# Patient Record
Sex: Female | Born: 1990 | Race: Black or African American | Hispanic: No | Marital: Single | Smoking: Current every day smoker
Health system: Southern US, Community
[De-identification: ages and names within clinical notes are randomized; demographics above are authoritative.]

## PROBLEM LIST (undated history)

## (undated) ENCOUNTER — Inpatient Hospital Stay (HOSPITAL_COMMUNITY): Payer: Self-pay

## (undated) DIAGNOSIS — R002 Palpitations: Secondary | ICD-10-CM

## (undated) DIAGNOSIS — R51 Headache: Secondary | ICD-10-CM

## (undated) DIAGNOSIS — K819 Cholecystitis, unspecified: Secondary | ICD-10-CM

## (undated) DIAGNOSIS — S060XAA Concussion with loss of consciousness status unknown, initial encounter: Secondary | ICD-10-CM

## (undated) DIAGNOSIS — O139 Gestational [pregnancy-induced] hypertension without significant proteinuria, unspecified trimester: Secondary | ICD-10-CM

## (undated) DIAGNOSIS — D649 Anemia, unspecified: Secondary | ICD-10-CM

## (undated) DIAGNOSIS — O09299 Supervision of pregnancy with other poor reproductive or obstetric history, unspecified trimester: Secondary | ICD-10-CM

## (undated) DIAGNOSIS — A749 Chlamydial infection, unspecified: Secondary | ICD-10-CM

## (undated) DIAGNOSIS — I10 Essential (primary) hypertension: Secondary | ICD-10-CM

## (undated) DIAGNOSIS — F419 Anxiety disorder, unspecified: Secondary | ICD-10-CM

## (undated) HISTORY — DX: Gestational (pregnancy-induced) hypertension without significant proteinuria, unspecified trimester: O13.9

## (undated) HISTORY — DX: Supervision of pregnancy with other poor reproductive or obstetric history, unspecified trimester: O09.299

---
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
## 2012-01-13 ENCOUNTER — Encounter (HOSPITAL_COMMUNITY): Payer: Self-pay | Admitting: *Deleted

## 2012-01-13 ENCOUNTER — Inpatient Hospital Stay (HOSPITAL_COMMUNITY)
Admission: AD | Admit: 2012-01-13 | Discharge: 2012-01-13 | Disposition: A | Discharge status: Home / Self Care | Payer: Medicaid Other | Source: Ambulatory Visit | Attending: Obstetrics & Gynecology | Admitting: Obstetrics & Gynecology

## 2012-01-13 DIAGNOSIS — N949 Unspecified condition associated with female genital organs and menstrual cycle: Secondary | ICD-10-CM

## 2012-01-13 DIAGNOSIS — R109 Unspecified abdominal pain: Secondary | ICD-10-CM | POA: Insufficient documentation

## 2012-01-13 DIAGNOSIS — O99891 Other specified diseases and conditions complicating pregnancy: Secondary | ICD-10-CM | POA: Insufficient documentation

## 2012-01-13 HISTORY — DX: Headache: R51

## 2012-01-13 HISTORY — DX: Chlamydial infection, unspecified: A74.9

## 2012-01-13 LAB — URINALYSIS, ROUTINE W REFLEX MICROSCOPIC
Bilirubin Urine: NEGATIVE
Hgb urine dipstick: NEGATIVE
Specific Gravity, Urine: 1.025 (ref 1.005–1.030)
pH: 6 (ref 5.0–8.0)

## 2012-01-13 LAB — WET PREP, GENITAL
Clue Cells Wet Prep HPF POC: NONE SEEN
Trich, Wet Prep: NONE SEEN
Yeast Wet Prep HPF POC: NONE SEEN

## 2012-01-13 NOTE — MAU Provider Note (Signed)
History     CSN: [id number]  Arrival date and time: 01/13/12 1315   None     Chief Complaint  Patient presents with  . Abdominal Cramping   HPI 21 y.o. G1P0 at [redacted]w[redacted]d with cramping intermittently since Sunday, worse with activity, no bleeding or discharge. Has not started prenatal care, now has Medicaid and is choosing a provider.    Past Medical History  Diagnosis Date  . Chlamydia 10-2011  . Infection   . Headache     Past Surgical History  Procedure Date  . No past surgeries     Family History  Problem Relation Age of Onset  . Anesthesia problems Neg Hx     History  Substance Use Topics  . Smoking status: Current Everyday Smoker -- 0.5 packs/day  . Smokeless tobacco: Never Used  . Alcohol Use: No    Allergies:  Allergies  Allergen Reactions  . Metronidazole Nausea And Vomiting    Prescriptions prior to admission  Medication Sig Dispense Refill  . OVER THE COUNTER MEDICATION Take 1 tablet by mouth daily. Patient takes gummy prenatal vitamin        Review of Systems  Constitutional: Negative.   Respiratory: Negative.   Cardiovascular: Negative.   Gastrointestinal: Positive for abdominal pain. Negative for nausea, vomiting, diarrhea and constipation.  Genitourinary: Negative for dysuria, urgency, frequency, hematuria and flank pain.       Negative for vaginal bleeding, vaginal discharge  Musculoskeletal: Negative.   Neurological: Negative.   Psychiatric/Behavioral: Negative.    Physical Exam   Blood pressure 120/60, pulse 77, temperature 98.8 F (37.1 C), temperature source Oral, resp. rate 16, height 5\' 6"  (1.676 m), weight 127 lb 9.6 oz (57.879 kg), last menstrual period 09/23/2011.  Physical Exam  Nursing note and vitals reviewed. Constitutional: She is oriented to person, place, and time. She appears well-developed and well-nourished. No distress.  HENT:  Head: Normocephalic and atraumatic.  Cardiovascular: Normal rate.   Respiratory:  Effort normal.  GI: Soft. Bowel sounds are normal. She exhibits no mass. There is no tenderness. There is no rebound and no guarding.  Genitourinary: There is no rash or lesion on the right labia. There is no rash or lesion on the left labia. Uterus is not tender. Enlarged: Size c/w dates. Cervix exhibits no motion tenderness, no discharge and no friability. Right adnexum displays no mass, no tenderness and no fullness. Left adnexum displays no mass, no tenderness and no fullness. No tenderness or bleeding around the vagina. Vaginal discharge (clear/white, nonmalodorous) found.  Musculoskeletal: Normal range of motion.  Neurological: She is alert and oriented to person, place, and time.  Skin: Skin is warm and dry.  Psychiatric: She has a normal mood and affect.    MAU Course  Procedures  Results for orders placed during the hospital encounter of 01/13/12 (from the past 24 hour(s))  URINALYSIS, ROUTINE W REFLEX MICROSCOPIC     Status: Normal   Collection Time   01/13/12  1:30 PM      Component Value Range   Color, Urine YELLOW  YELLOW    APPearance CLEAR  CLEAR    Specific Gravity, Urine 1.025  1.005 - 1.030    pH 6.0  5.0 - 8.0    Glucose, UA NEGATIVE  NEGATIVE (mg/dL)   Hgb urine dipstick NEGATIVE  NEGATIVE    Bilirubin Urine NEGATIVE  NEGATIVE    Ketones, ur NEGATIVE  NEGATIVE (mg/dL)   Protein, ur NEGATIVE  NEGATIVE (mg/dL)  Urobilinogen, UA 0.2  0.0 - 1.0 (mg/dL)   Nitrite NEGATIVE  NEGATIVE    Leukocytes, UA NEGATIVE  NEGATIVE      Assessment and Plan  21 y.o. G1P0000 at [redacted]w[redacted]d Round ligament pain Wet prep and GC pending - will call with results   Markice Torbert 01/13/2012, 3:06 PM

## 2012-01-13 NOTE — MAU Note (Signed)
Pt states abdominal cramping started at work on Sunday of this week about 1700.  Pt denies any vaginal bleeding and any changes in vaginal discharge.  Pt came to MAU today without any medical attention since beginning of pregnancy.  Pt just received Medicaid on 12-23-11 and is undecided on OB at this time.

## 2012-01-14 LAB — GC/CHLAMYDIA PROBE AMP, GENITAL: Chlamydia, DNA Probe: NEGATIVE

## 2012-01-17 NOTE — MAU Provider Note (Signed)
Attestation of Attending Supervision of Advanced Practitioner: Evaluation and management procedures were performed by the OB Fellow/PA/CNM/NP under my supervision and collaboration. Chart reviewed, and agree with management and plan.  Kerri Asche, M.D. 01/17/2012 11:27 AM   

## 2012-02-20 LAB — OB RESULTS CONSOLE HEPATITIS B SURFACE ANTIGEN: Hepatitis B Surface Ag: NEGATIVE

## 2012-02-20 LAB — OB RESULTS CONSOLE RPR: RPR: NONREACTIVE

## 2012-02-20 LAB — OB RESULTS CONSOLE HIV ANTIBODY (ROUTINE TESTING): HIV: NONREACTIVE

## 2012-02-20 LAB — OB RESULTS CONSOLE RUBELLA ANTIBODY, IGM: Rubella: IMMUNE

## 2012-04-24 ENCOUNTER — Inpatient Hospital Stay (HOSPITAL_COMMUNITY)
Admission: AD | Admit: 2012-04-24 | Discharge: 2012-04-24 | Discharge status: Against Medical Advice | Payer: Medicaid Other | Source: Ambulatory Visit | Attending: Obstetrics | Admitting: Obstetrics

## 2012-04-24 DIAGNOSIS — R109 Unspecified abdominal pain: Secondary | ICD-10-CM | POA: Insufficient documentation

## 2012-04-24 DIAGNOSIS — M545 Low back pain, unspecified: Secondary | ICD-10-CM | POA: Insufficient documentation

## 2012-04-24 DIAGNOSIS — O99891 Other specified diseases and conditions complicating pregnancy: Secondary | ICD-10-CM | POA: Insufficient documentation

## 2012-04-24 LAB — URINALYSIS, ROUTINE W REFLEX MICROSCOPIC
Bilirubin Urine: NEGATIVE
Ketones, ur: NEGATIVE mg/dL
Nitrite: NEGATIVE
Urobilinogen, UA: 0.2 mg/dL (ref 0.0–1.0)

## 2012-04-24 LAB — URINE MICROSCOPIC-ADD ON

## 2012-04-24 NOTE — MAU Note (Signed)
Pt reports pain in lower abd and lower back for 2 hours. Pain comes and goes. Denies dysuria. Denies vaginal bleeding.

## 2012-04-24 NOTE — MAU Note (Signed)
Patient told registration she was leaving just as she was about to be called back. Tracked patient down leaving in her car. She stated her pain was better and didn't think she needed to be seen anymore. She just wanted to lay down. I instructed the patient if she came back in she would be called back and seen right away. She stated she would return to the hospital if the pain returned or worsened.

## 2012-05-04 ENCOUNTER — Inpatient Hospital Stay (HOSPITAL_COMMUNITY)
Admission: AD | Admit: 2012-05-04 | Discharge: 2012-05-04 | Disposition: A | Discharge status: Home / Self Care | Payer: Medicaid Other | Source: Ambulatory Visit | Attending: Obstetrics | Admitting: Obstetrics

## 2012-05-04 ENCOUNTER — Encounter (HOSPITAL_COMMUNITY): Payer: Self-pay

## 2012-05-04 DIAGNOSIS — O479 False labor, unspecified: Secondary | ICD-10-CM

## 2012-05-04 DIAGNOSIS — O10019 Pre-existing essential hypertension complicating pregnancy, unspecified trimester: Secondary | ICD-10-CM | POA: Insufficient documentation

## 2012-05-04 DIAGNOSIS — O210 Mild hyperemesis gravidarum: Secondary | ICD-10-CM | POA: Insufficient documentation

## 2012-05-04 DIAGNOSIS — O169 Unspecified maternal hypertension, unspecified trimester: Secondary | ICD-10-CM

## 2012-05-04 LAB — WET PREP, GENITAL
Clue Cells Wet Prep HPF POC: NONE SEEN
Trich, Wet Prep: NONE SEEN
Yeast Wet Prep HPF POC: NONE SEEN

## 2012-05-04 LAB — COMPREHENSIVE METABOLIC PANEL
Albumin: 2.8 g/dL — ABNORMAL LOW (ref 3.5–5.2)
BUN: 7 mg/dL (ref 6–23)
Calcium: 9.3 mg/dL (ref 8.4–10.5)
Chloride: 99 mEq/L (ref 96–112)
Creatinine, Ser: 0.44 mg/dL — ABNORMAL LOW (ref 0.50–1.10)
GFR calc non Af Amer: >90 mL/min (ref >90)
Total Bilirubin: 0.1 mg/dL — ABNORMAL LOW (ref 0.3–1.2)

## 2012-05-04 LAB — URINALYSIS, ROUTINE W REFLEX MICROSCOPIC
Glucose, UA: NEGATIVE mg/dL
Hgb urine dipstick: NEGATIVE
Specific Gravity, Urine: 1.02 (ref 1.005–1.030)
Urobilinogen, UA: 0.2 mg/dL (ref 0.0–1.0)

## 2012-05-04 LAB — CBC
HCT: 30.3 % — ABNORMAL LOW (ref 36.0–46.0)
MCH: 23.8 pg — ABNORMAL LOW (ref 26.0–34.0)
MCV: 70 fL — ABNORMAL LOW (ref 78.0–100.0)
RDW: 14.6 % (ref 11.5–15.5)
WBC: 11.7 10*3/uL — ABNORMAL HIGH (ref 4.0–10.5)

## 2012-05-04 MED ORDER — NIFEDIPINE 10 MG PO CAPS
ORAL_CAPSULE | ORAL | Status: AC
  Filled 2012-05-04: qty 1

## 2012-05-04 MED ORDER — NIFEDIPINE 10 MG PO CAPS
10.0000 mg | ORAL_CAPSULE | ORAL | Status: DC | PRN
  Administered 2012-05-04 (×2): 10 mg via ORAL
  Filled 2012-05-04: qty 1

## 2012-05-04 NOTE — MAU Provider Note (Signed)
Chief Complaint:  Contractions   First Provider Initiated Contact with Patient 05/04/12 2025      HPI  [name] [name] is a 21 y.o. G1P0000 at [redacted]w[redacted]d presenting with upper and lower abdominal pain/tightening for a couple of weeks, she states that today the pain has become more frequent, ~3-10 minutes. She states that she told dr Gaynell Face about it and he advised her to come in. She denies HA, vision changes, n/v/d, vaginal bleeding, lof or recent intercourse. She reports good fetal movement, vaginal discharge.   Past Medical History: Past Medical History  Diagnosis Date  . Chlamydia 10-2011  . Infection   . Headache     Past Surgical History: Past Surgical History  Procedure Date  . No past surgeries     Family History: Family History  Problem Relation Age of Onset  . Anesthesia problems Neg Hx     Social History: History  Substance Use Topics  . Smoking status: Current Everyday Smoker -- 0.5 packs/day    Types: Cigarettes  . Smokeless tobacco: Never Used  . Alcohol Use: No    Allergies:  Allergies  Allergen Reactions  . Metronidazole Nausea And Vomiting    Meds:  Prescriptions prior to admission  Medication Sig Dispense Refill  . Prenatal Vit-Fe Fumarate-FA (PRENATAL MULTIVITAMIN) TABS Take 1 tablet by mouth daily.          Physical Exam  Blood pressure 139/81, pulse 103, temperature 98.5 F (36.9 C), temperature source Oral, resp. rate 20, height 5' 4.5" (1.638 m), weight 70.818 kg (156 lb 2 oz), last menstrual period 09/23/2011.  --   --   --   --  PO     05/04/12 2231   --   109   --   --   140/61 mmHg       --  PO     05/04/12 2216   --   112   --   --   142/70 mmHg       --  PO     05/04/12 2201   --   108   --   --   131/70 mmHg       --  PO     05/04/12 2146   --   110   --   --   120/95 mmHg       --  PO     05/04/12 2131   --   97   --   --   132/72 mmHg       --  PO     05/04/12 2116   --   96   --   --   132/74 mmHg       --  PO     05/04/12 2102    --   93   --   --   127/61 mmHg       --  PO     05/04/12 2100   --   97   --   18   127/76 mmHg       --  PO     05/04/12 2040   --   101   --   --   --       --  PO     05/04/12 2040   --   --   --   --   139/81 mmHg       --  Ellard Artis  05/04/12 1939   98.5 F (36.9 C)   103   --   20   ! 141/113 mmHg       70.818 kg (156 lb 2 oz)  CH        GENERAL: Well-developed, well-nourished female in mild distress.  HEENT: normocephalic, good dentition HEART: normal rate RESP: normal effort ABDOMEN: Soft, nontender, gravid appropriate for gestational age EXTREMITIES: Nontender, Trace edema NEURO: alert and oriented  SPECULUM EXAM: NEFG, Neg pool, small amount of clear mucous,  Friable cervix.   FHT:  Baseline 130 , moderate variability, accelerations present, no decelerations Contractions: q 3-5 mins    Cervix closed and long  Labs: Recent Results (from the past 168 hour(s))  URINALYSIS, ROUTINE W REFLEX MICROSCOPIC   Collection Time   05/04/12  7:44 PM      Component Value Range   Color, Urine YELLOW  YELLOW   APPearance CLEAR  CLEAR   Specific Gravity, Urine 1.020  1.005 - 1.030   pH 6.0  5.0 - 8.0   Glucose, UA NEGATIVE  NEGATIVE mg/dL   Hgb urine dipstick NEGATIVE  NEGATIVE   Bilirubin Urine NEGATIVE  NEGATIVE   Ketones, ur NEGATIVE  NEGATIVE mg/dL   Protein, ur NEGATIVE  NEGATIVE mg/dL   Urobilinogen, UA 0.2  0.0 - 1.0 mg/dL   Nitrite NEGATIVE  NEGATIVE   Leukocytes, UA NEGATIVE  NEGATIVE  WET PREP, GENITAL   Collection Time   05/04/12  8:37 PM      Component Value Range   Yeast Wet Prep HPF POC NONE SEEN  NONE SEEN   Trich, Wet Prep NONE SEEN  NONE SEEN   Clue Cells Wet Prep HPF POC NONE SEEN  NONE SEEN   WBC, Wet Prep HPF POC MODERATE (*) NONE SEEN  GC/CHLAMYDIA PROBE AMP, GENITAL   Collection Time   05/04/12  8:37 PM      Component Value Range   GC Probe Amp, Genital NEGATIVE  NEGATIVE   Chlamydia, DNA Probe NEGATIVE  NEGATIVE  CBC   Collection Time   05/04/12  9:15 PM        Component Value Range   WBC 11.7 (*) 4.0 - 10.5 K/uL   RBC 4.33  3.87 - 5.11 MIL/uL   Hemoglobin 10.3 (*) 12.0 - 15.0 g/dL   HCT 16.1 (*) 09.6 - 04.5 %   MCV 70.0 (*) 78.0 - 100.0 fL   MCH 23.8 (*) 26.0 - 34.0 pg   MCHC 34.0  30.0 - 36.0 g/dL   RDW 40.9  81.1 - 91.4 %   Platelets 340  150 - 400 K/uL  COMPREHENSIVE METABOLIC PANEL   Collection Time   05/04/12  9:15 PM      Component Value Range   Sodium 132 (*) 135 - 145 mEq/L   Potassium 3.9  3.5 - 5.1 mEq/L   Chloride 99  96 - 112 mEq/L   CO2 24  19 - 32 mEq/L   Glucose, Bld 84  70 - 99 mg/dL   BUN 7  6 - 23 mg/dL   Creatinine, Ser 7.82 (*) 0.50 - 1.10 mg/dL   Calcium 9.3  8.4 - 95.6 mg/dL   Total Protein 7.1  6.0 - 8.3 g/dL   Albumin 2.8 (*) 3.5 - 5.2 g/dL   AST 16  0 - 37 U/L   ALT 12  0 - 35 U/L   Alkaline Phosphatase 109  39 - 117 U/L   Total Bilirubin 0.1 (*)  0.3 - 1.2 mg/dL   GFR calc non Af Amer >90  >90 mL/min   GFR calc Af Amer >90  >90 mL/min   Imaging:  NA  Assessment: 1. Preterm contractions   2. Hypertension complicating pregnancy    Plan: D/C home PTL precautions Follow-up Information    Follow up with MARSHALL,BERNARD A, MD. (As scheduled or as needed if symptoms worsen)    Contact information:   [address] [place] [address] [phone]       Follow up with [facility]. (As needed if symptoms worsen)    Contact information:   [address] [place] [address] [phone]        Medication List  As of 05/07/2012 11:00 PM   CONTINUE taking these medications         prenatal multivitamin Tabs           Dorathy Kinsman 8/2/20138:43 PM

## 2012-05-04 NOTE — MAU Note (Signed)
Pt states, " I've had contractions and low back pain off and on for a couple of weeks. Today it woke me up and and I've had them off and on all day."

## 2012-05-04 NOTE — MAU Note (Signed)
Patient is in with c/o ongoing abdominal pain/tightening for a couple of weeks, she states that today the pain more frequent. She states that she told dr Gaynell Face about it and he advised her to come in. She denies any n/v/d, vaginal bleeding, lof or recent intercourse. She reports good fetal movement.

## 2012-05-28 LAB — OB RESULTS CONSOLE GBS: GBS: POSITIVE

## 2012-06-01 ENCOUNTER — Inpatient Hospital Stay (HOSPITAL_COMMUNITY)
Admission: AD | Admit: 2012-06-01 | Discharge: 2012-06-01 | Disposition: A | Discharge status: Home / Self Care | Payer: Medicaid Other | Source: Ambulatory Visit | Attending: Obstetrics | Admitting: Obstetrics

## 2012-06-01 ENCOUNTER — Encounter (HOSPITAL_COMMUNITY): Payer: Self-pay | Admitting: *Deleted

## 2012-06-01 DIAGNOSIS — O479 False labor, unspecified: Secondary | ICD-10-CM

## 2012-06-01 DIAGNOSIS — O4700 False labor before 37 completed weeks of gestation, unspecified trimester: Secondary | ICD-10-CM | POA: Insufficient documentation

## 2012-06-01 NOTE — MAU Note (Signed)
PT SAYS  HURT BAD AT 0100-  VE IN OFFICE - DOESN'T KNOW.    DENIES HSV AND MRSA

## 2012-06-01 NOTE — MAU Provider Note (Signed)
Chief Complaint:  No chief complaint on file.  First Provider Initiated Contact with Patient 06/01/12 0354     HPI: [name] [name] is a 21 y.o. G1P0000 at 31w4dwho presents to maternity admissions reporting contractions every evening x a few weeks that were stronger tonight. Denies leakage of fluid or vaginal bleeding. Good fetal movement.   Past Medical History: Past Medical History  Diagnosis Date  . Chlamydia 10-2011  . Infection   . Headache     Past obstetric history: OB History    Grav Para Term Preterm Abortions TAB SAB Ect Mult Living   1 0 0 0 0 0 0 0 0 0      # Outc Date GA Lbr Len/2nd Wgt Sex Del Anes PTL Lv   1 CUR               Past Surgical History: Past Surgical History  Procedure Date  . No past surgeries     Family History: Family History  Problem Relation Age of Onset  . Anesthesia problems Neg Hx     Social History: History  Substance Use Topics  . Smoking status: Current Everyday Smoker -- 0.5 packs/day    Types: Cigarettes  . Smokeless tobacco: Never Used  . Alcohol Use: No    Allergies:  Allergies  Allergen Reactions  . Metronidazole Nausea And Vomiting    Meds:  Prescriptions prior to admission  Medication Sig Dispense Refill  . Prenatal Vit-Fe Fumarate-FA (PRENATAL MULTIVITAMIN) TABS Take 1 tablet by mouth daily.        ROS: Pertinent findings in history of present illness.  Physical Exam  Blood pressure 139/80, pulse 86, temperature 98.4 F (36.9 C), temperature source Oral, resp. rate 20, height 5\' 4"  (1.626 m), weight 77.565 kg (171 lb), last menstrual period 09/23/2011. GENERAL: Well-developed, well-nourished female in mild distress.  HEENT: normocephalic HEART: normal rate RESP: normal effort ABDOMEN: Soft, non-tender, gravid appropriate for gestational age EXTREMITIES: Nontender, no edema NEURO: alert and oriented SPECULUM EXAM: deferred Dilation: Fingertip Cervical Position: Posterior Presentation: Vertex Exam by::  DCALLAWAY, RN  FHT:  Baseline 130 , moderate variability, accelerations present, no decelerations Contractions: q 3-6 mins, mild   Labs: No results found for this or any previous visit (from the past 24 hour(s)).  Imaging:  No results found. ED Course   Assessment: 1. Preterm contractions    Plan: Discharge home Labor precautions and fetal kick counts Comfort measures Medication List  As of 06/01/2012  3:54 AM   ASK your doctor about these medications         prenatal multivitamin Tabs   Take 1 tablet by mouth daily.           Follow-up Information    Follow up with Kathreen Cosier, MD on 06/05/2012.   Contact information:   [address] [place] [address] [phone]       Follow up with [facility]. (As needed if symptoms worsen)    Contact information:   [address] [place] [address] [phone]        Dorathy Kinsman, [place] 06/01/2012 3:54 AM

## 2012-06-14 ENCOUNTER — Inpatient Hospital Stay (HOSPITAL_COMMUNITY)
Admission: AD | Admit: 2012-06-14 | Discharge: 2012-06-15 | Disposition: A | Discharge status: Home / Self Care | Payer: Medicaid Other | Source: Ambulatory Visit | Attending: Obstetrics | Admitting: Obstetrics

## 2012-06-14 ENCOUNTER — Encounter (HOSPITAL_COMMUNITY): Payer: Self-pay | Admitting: *Deleted

## 2012-06-14 DIAGNOSIS — O368190 Decreased fetal movements, unspecified trimester, not applicable or unspecified: Secondary | ICD-10-CM | POA: Insufficient documentation

## 2012-06-14 DIAGNOSIS — O26899 Other specified pregnancy related conditions, unspecified trimester: Secondary | ICD-10-CM

## 2012-06-14 DIAGNOSIS — R109 Unspecified abdominal pain: Secondary | ICD-10-CM | POA: Insufficient documentation

## 2012-06-14 DIAGNOSIS — R51 Headache: Secondary | ICD-10-CM | POA: Insufficient documentation

## 2012-06-14 LAB — COMPREHENSIVE METABOLIC PANEL
AST: 18 U/L (ref 0–37)
Albumin: 2.5 g/dL — ABNORMAL LOW (ref 3.5–5.2)
Alkaline Phosphatase: 163 U/L — ABNORMAL HIGH (ref 39–117)
Chloride: 103 mEq/L (ref 96–112)
Potassium: 3.6 mEq/L (ref 3.5–5.1)
Total Bilirubin: 0.1 mg/dL — ABNORMAL LOW (ref 0.3–1.2)

## 2012-06-14 LAB — CBC
Platelets: 297 10*3/uL (ref 150–400)
RDW: 15.3 % (ref 11.5–15.5)
WBC: 11.5 10*3/uL — ABNORMAL HIGH (ref 4.0–10.5)

## 2012-06-14 LAB — URINE MICROSCOPIC-ADD ON

## 2012-06-14 LAB — URINALYSIS, ROUTINE W REFLEX MICROSCOPIC
Glucose, UA: NEGATIVE mg/dL
Protein, ur: NEGATIVE mg/dL
Urobilinogen, UA: 0.2 mg/dL (ref 0.0–1.0)

## 2012-06-14 MED ORDER — ACETAMINOPHEN 325 MG PO TABS
650.0000 mg | ORAL_TABLET | Freq: Once | ORAL | Status: AC
  Administered 2012-06-14: 650 mg via ORAL
  Filled 2012-06-14: qty 2

## 2012-06-14 MED ORDER — GI COCKTAIL ~~LOC~~
30.0000 mL | Freq: Once | ORAL | Status: AC
  Administered 2012-06-14: 30 mL via ORAL
  Filled 2012-06-14: qty 30

## 2012-06-14 NOTE — MAU Note (Signed)
Patient is in with c/o intense nausea and epigastric/abominal cramping after eating chinese food this afternoon. She c/o decreased fetal movement, intermittent blurred vision and headache since yesterday. Denies any vaginal bleeding or lof.

## 2012-06-14 NOTE — MAU Provider Note (Signed)
  History     CSN: [id number]  Arrival date and time: 06/14/12 2036   First Provider Initiated Contact with Patient 06/14/12 2201      Chief Complaint  Patient presents with  . Nausea  . Abdominal Cramping  . Decreased Fetal Movement  . Headache  . Blurred Vision   HPI Pt is [redacted]w[redacted]d pregnant and presents with headache in pregnancy and upper abdominal pain.  Pt states that she has had headache with intermittent blurred vision for 2 days.  She also has noticed decrease fetal movement.  Pt denies contractions, bleeding, spotting, or leakage of fluid.  Pt has had some nausea but no vomiting.  Pt ate [place] food today at 4 pm and had increase in her nausea.  Pt has not taken anything for her headache.  Past Medical History  Diagnosis Date  . Chlamydia 10-2011  . Infection   . Headache     Past Surgical History  Procedure Date  . No past surgeries     Family History  Problem Relation Age of Onset  . Anesthesia problems Neg Hx     History  Substance Use Topics  . Smoking status: Current Every Day Smoker -- 0.2 packs/day for 3 years    Types: Cigarettes  . Smokeless tobacco: Never Used  . Alcohol Use: No    Allergies:  Allergies  Allergen Reactions  . Metronidazole Nausea And Vomiting    No prescriptions prior to admission    ROS Physical Exam   Blood pressure 139/71, pulse 84, temperature 99.3 F (37.4 C), temperature source Oral, resp. rate 20, height 5\' 4"  (1.626 m), weight 78.245 kg (172 lb 8 oz), last menstrual period 09/23/2011, SpO2 100.00%.  Physical Exam  MAU Course  Procedures Headache resolved with Tylenol; upper abdominal pain improved Results for orders placed during the hospital encounter of 06/14/12 (from the past 24 hour(s))  URINALYSIS, ROUTINE W REFLEX MICROSCOPIC     Status: Abnormal   Collection Time   06/14/12  9:08 PM      Component Value Range   Color, Urine YELLOW  YELLOW   APPearance CLEAR  CLEAR   Specific Gravity, Urine 1.015   1.005 - 1.030   pH 7.0  5.0 - 8.0   Glucose, UA NEGATIVE  NEGATIVE mg/dL   Hgb urine dipstick NEGATIVE  NEGATIVE   Bilirubin Urine NEGATIVE  NEGATIVE   Ketones, ur NEGATIVE  NEGATIVE mg/dL   Protein, ur NEGATIVE  NEGATIVE mg/dL   Urobilinogen, UA 0.2  0.0 - 1.0 mg/dL   Nitrite NEGATIVE  NEGATIVE   Leukocytes, UA TRACE (*) NEGATIVE  URINE MICROSCOPIC-ADD ON     Status: Abnormal   Collection Time   06/14/12  9:08 PM      Component Value Range   Squamous Epithelial / LPF MANY (*) RARE   WBC, UA 3-6  <3 WBC/hpf   Bacteria, UA FEW (*) RARE   Urine-Other MUCOUS PRESENT     Assessment and Plan  Headache in pregnancy  [name] [name] 06/14/2012, 10:45 PM

## 2012-06-18 ENCOUNTER — Encounter (HOSPITAL_COMMUNITY): Payer: Self-pay | Admitting: *Deleted

## 2012-06-18 ENCOUNTER — Inpatient Hospital Stay (HOSPITAL_COMMUNITY)
Admission: AD | Admit: 2012-06-18 | Discharge: 2012-06-18 | Disposition: A | Discharge status: Home / Self Care | Payer: Medicaid Other | Source: Ambulatory Visit | Attending: Obstetrics | Admitting: Obstetrics

## 2012-06-18 DIAGNOSIS — O479 False labor, unspecified: Secondary | ICD-10-CM

## 2012-06-18 DIAGNOSIS — R109 Unspecified abdominal pain: Secondary | ICD-10-CM | POA: Insufficient documentation

## 2012-06-18 LAB — COMPREHENSIVE METABOLIC PANEL
ALT: 10 U/L (ref 0–35)
Alkaline Phosphatase: 168 U/L — ABNORMAL HIGH (ref 39–117)
GFR calc Af Amer: >90 mL/min (ref >90)
Glucose, Bld: 64 mg/dL — ABNORMAL LOW (ref 70–99)
Potassium: 3.7 mEq/L (ref 3.5–5.1)
Sodium: 135 mEq/L (ref 135–145)
Total Protein: 6.3 g/dL (ref 6.0–8.3)

## 2012-06-18 LAB — URINALYSIS, ROUTINE W REFLEX MICROSCOPIC
Glucose, UA: NEGATIVE mg/dL
Hgb urine dipstick: NEGATIVE
Protein, ur: NEGATIVE mg/dL
Specific Gravity, Urine: 1.015 (ref 1.005–1.030)
pH: 6.5 (ref 5.0–8.0)

## 2012-06-18 LAB — CBC
Hemoglobin: 9.7 g/dL — ABNORMAL LOW (ref 12.0–15.0)
MCHC: 31.7 g/dL (ref 30.0–36.0)
RBC: 4.24 MIL/uL (ref 3.87–5.11)

## 2012-06-18 NOTE — MAU Note (Signed)
Patient states she is having contractions about every 2 minutes. Denies any leaking or bleeding and reports good fetal movement.

## 2012-06-18 NOTE — MAU Provider Note (Signed)
History     CSN: [id number]  Arrival date and time: 06/18/12 1028   First Provider Initiated Contact with Patient 06/18/12 1156      Chief Complaint  Patient presents with  . Labor Eval   HPI [name] [name] is 21 y.o. G1P0000 [redacted]w[redacted]d weeks presenting with lower abdominal pain.  States "my pelvic bone feels like it is pulling".  Patient of Dr. Elsie Stain.  Denies vaginal bleeding.  Her blood pressure is elevated.  Dr. Gaynell Face was informed and he has given PIH orders.  I have completed the MSE.  Patient is stable.      Past Medical History  Diagnosis Date  . Chlamydia 10-2011  . Infection   . Headache     Past Surgical History  Procedure Date  . No past surgeries     Family History  Problem Relation Age of Onset  . Anesthesia problems Neg Hx     History  Substance Use Topics  . Smoking status: Current Every Day Smoker -- 0.2 packs/day for 3 years    Types: Cigarettes  . Smokeless tobacco: Never Used  . Alcohol Use: No    Allergies:  Allergies  Allergen Reactions  . Metronidazole Nausea And Vomiting    No prescriptions prior to admission    ROS Physical Exam   Blood pressure 145/86, pulse 91, temperature 98.5 F (36.9 C), temperature source Oral, resp. rate 18, height 5\' 4"  (1.626 m), weight 79.924 kg (176 lb 3.2 oz), last menstrual period 09/23/2011, SpO2 100.00%.  Physical Exam  Constitutional: She is oriented to person, place, and time. She appears well-developed and well-nourished. She appears distressed (uncomfortable).  HENT:  Head: Normocephalic.  Cardiovascular: Normal rate.   Respiratory: Effort normal.  GI: There is tenderness (lower abdominal tenderness over the symphysis).  Neurological: She is alert and oriented to person, place, and time.  Skin: Skin is warm and dry.  Psychiatric: She has a normal mood and affect. Her behavior is normal.    Cervical exam by Darel Hong, RN--Cervix is 1 cm dilated Results for orders placed during the hospital  encounter of 06/18/12 (from the past 24 hour(s))  CBC     Status: Abnormal   Collection Time   06/18/12 11:35 AM      Component Value Range   WBC 11.7 (*) 4.0 - 10.5 K/uL   RBC 4.24  3.87 - 5.11 MIL/uL   Hemoglobin 9.7 (*) 12.0 - 15.0 g/dL   HCT 40.9 (*) 81.1 - 91.4 %   MCV 72.2 (*) 78.0 - 100.0 fL   MCH 22.9 (*) 26.0 - 34.0 pg   MCHC 31.7  30.0 - 36.0 g/dL   RDW 78.2  95.6 - 21.3 %   Platelets 279  150 - 400 K/uL  COMPREHENSIVE METABOLIC PANEL     Status: Abnormal   Collection Time   06/18/12 11:35 AM      Component Value Range   Sodium 135  135 - 145 mEq/L   Potassium 3.7  3.5 - 5.1 mEq/L   Chloride 104  96 - 112 mEq/L   CO2 22  19 - 32 mEq/L   Glucose, Bld 64 (*) 70 - 99 mg/dL   BUN 4 (*) 6 - 23 mg/dL   Creatinine, Ser 0.86  0.50 - 1.10 mg/dL   Calcium 9.4  8.4 - 57.8 mg/dL   Total Protein 6.3  6.0 - 8.3 g/dL   Albumin 2.4 (*) 3.5 - 5.2 g/dL   AST 13  0 -  37 U/L   ALT 10  0 - 35 U/L   Alkaline Phosphatase 168 (*) 39 - 117 U/L   Total Bilirubin 0.1 (*) 0.3 - 1.2 mg/dL   GFR calc non Af Amer >90  >90 mL/min   GFR calc Af Amer >90  >90 mL/min  LACTATE DEHYDROGENASE     Status: Normal   Collection Time   06/18/12 11:35 AM      Component Value Range   LDH 161  94 - 250 U/L  URIC ACID     Status: Normal   Collection Time   06/18/12 11:35 AM      Component Value Range   Uric Acid, Serum 3.4  2.4 - 7.0 mg/dL  URINALYSIS, ROUTINE W REFLEX MICROSCOPIC     Status: Normal   Collection Time   06/18/12 12:45 PM      Component Value Range   Color, Urine YELLOW  YELLOW   APPearance CLEAR  CLEAR   Specific Gravity, Urine 1.015  1.005 - 1.030   pH 6.5  5.0 - 8.0   Glucose, UA NEGATIVE  NEGATIVE mg/dL   Hgb urine dipstick NEGATIVE  NEGATIVE   Bilirubin Urine NEGATIVE  NEGATIVE   Ketones, ur NEGATIVE  NEGATIVE mg/dL   Protein, ur NEGATIVE  NEGATIVE mg/dL   Urobilinogen, UA 0.2  0.0 - 1.0 mg/dL   Nitrite NEGATIVE  NEGATIVE   Leukocytes, UA NEGATIVE  NEGATIVE    Patient Vitals  for the past 24 hrs:  BP Temp Temp src Pulse Resp SpO2 Height Weight  06/18/12 1332 112/82 mmHg - - 83  - - - -  06/18/12 1317 124/70 mmHg - - 85  - - - -  06/18/12 1302 132/79 mmHg - - 78  - - - -  06/18/12 1252 136/77 mmHg - - 85  - - - -  06/18/12 1231 138/92 mmHg - - 85  - - - -  06/18/12 1216 142/84 mmHg - - 86  - - - -  06/18/12 1202 143/74 mmHg - - 91  - - - -  06/18/12 1147 134/89 mmHg - - 91  - - - -  06/18/12 1139 145/86 mmHg - - 91  - - - -  06/18/12 1126 143/86 mmHg - - 91  - - - -  06/18/12 1037 144/88 mmHg 98.5 F (36.9 C) Oral 88  18  100 % 5\' 4"  (1.626 m) 79.924 kg (176 lb 3.2 oz)   MAU Course  Procedures  MDM Reported labs to Dr. Gaynell Face at 14:00--order given to be discharged to home.  Patient has appt for tomorrow. FMS REACTIVE  Assessment and Plan  A:  False labor     Borderline blood pressure  P:  Keep scheduled appointment with Dr. Gaynell Face for tomorrow.     Call if sxs worsen     May take tylenol for discomfort Instructed to stay well hydrated and rest today Kalum Minner,EVE M 06/18/2012, 12:03 PM

## 2012-06-19 ENCOUNTER — Encounter (HOSPITAL_COMMUNITY): Payer: Self-pay | Admitting: *Deleted

## 2012-06-19 ENCOUNTER — Telehealth (HOSPITAL_COMMUNITY): Payer: Self-pay | Admitting: *Deleted

## 2012-06-19 NOTE — Telephone Encounter (Signed)
Preadmission screen  

## 2012-06-22 ENCOUNTER — Inpatient Hospital Stay (HOSPITAL_COMMUNITY)
Admission: AD | Admit: 2012-06-22 | Discharge: 2012-06-22 | Disposition: A | Discharge status: Home / Self Care | Payer: Medicaid Other | Source: Ambulatory Visit | Attending: Obstetrics | Admitting: Obstetrics

## 2012-06-22 ENCOUNTER — Encounter (HOSPITAL_COMMUNITY): Payer: Self-pay

## 2012-06-22 DIAGNOSIS — M5430 Sciatica, unspecified side: Secondary | ICD-10-CM | POA: Insufficient documentation

## 2012-06-22 DIAGNOSIS — M259 Joint disorder, unspecified: Secondary | ICD-10-CM | POA: Insufficient documentation

## 2012-06-22 DIAGNOSIS — O479 False labor, unspecified: Secondary | ICD-10-CM | POA: Insufficient documentation

## 2012-06-22 DIAGNOSIS — M545 Low back pain, unspecified: Secondary | ICD-10-CM | POA: Insufficient documentation

## 2012-06-22 DIAGNOSIS — M899 Disorder of bone, unspecified: Secondary | ICD-10-CM | POA: Insufficient documentation

## 2012-06-22 LAB — URINALYSIS, ROUTINE W REFLEX MICROSCOPIC
Bilirubin Urine: NEGATIVE
Glucose, UA: NEGATIVE mg/dL
Hgb urine dipstick: NEGATIVE
Ketones, ur: NEGATIVE mg/dL
Leukocytes, UA: NEGATIVE
pH: 6.5 (ref 5.0–8.0)

## 2012-06-22 LAB — URINE MICROSCOPIC-ADD ON

## 2012-06-22 MED ORDER — TRAMADOL HCL 50 MG PO TABS
50.0000 mg | ORAL_TABLET | Freq: Once | ORAL | Status: AC
  Administered 2012-06-22: 50 mg via ORAL
  Filled 2012-06-22: qty 1

## 2012-06-22 MED ORDER — CYCLOBENZAPRINE HCL 10 MG PO TABS
10.0000 mg | ORAL_TABLET | Freq: Two times a day (BID) | ORAL | Status: DC | PRN
Start: 2012-06-22 — End: 2012-07-03

## 2012-06-22 MED ORDER — TRAMADOL HCL 50 MG PO TABS: 50.0000 mg | ORAL_TABLET | Freq: Four times a day (QID) | ORAL | Status: DC | PRN

## 2012-06-22 NOTE — MAU Note (Signed)
Patient is in with c/o constant lower back pain (10/10) and intermittent contractions. Denies vaginal bleeding or lof. She reports good fetal movement

## 2012-06-22 NOTE — MAU Provider Note (Signed)
  History     CSN: [id number]  Arrival date and time: 06/22/12 2101   First Provider Initiated Contact with Patient 06/22/12 2138      Chief Complaint  Patient presents with  . Back Pain  . Contractions   HPI This is a 21 y.o. female at [redacted]w[redacted]d who presents with c/o sharp lower back pain over sciatic joint, R > L for 2 days. Has never had this pain before. Has irregular contractions, not associated with this pain. They feel like cramping. Hurts to walk. No weakness or numbness. Good fetal movement. Denies leaking or bleeding.  OB History    Grav Para Term Preterm Abortions TAB SAB Ect Mult Living   1 0 0 0 0 0 0 0 0 0       Past Medical History  Diagnosis Date  . Chlamydia 10-2011  . Infection   . Headache     Past Surgical History  Procedure Date  . No past surgeries     Family History  Problem Relation Age of Onset  . Anesthesia problems Neg Hx     History  Substance Use Topics  . Smoking status: Current Every Day Smoker -- 0.2 packs/day for 3 years    Types: Cigarettes  . Smokeless tobacco: Never Used  . Alcohol Use: No    Allergies:  Allergies  Allergen Reactions  . Metronidazole Nausea And Vomiting    No prescriptions prior to admission    ROS See HPI  Physical Exam   Blood pressure 140/89, pulse 108, temperature 98.9 F (37.2 C), temperature source Oral, resp. rate 18, height 5\' 4"  (1.626 m), weight 174 lb 6 oz (79.096 kg), last menstrual period 09/23/2011.  Physical Exam  Constitutional: She is oriented to person, place, and time. She appears well-developed and well-nourished. No distress.  Cardiovascular: Normal rate.   Respiratory: Effort normal.  GI: Soft. She exhibits no distension and no mass. There is no tenderness. There is no rebound and no guarding.  Genitourinary: Vagina normal and uterus normal. No vaginal discharge found.       Dilation: 1 Effacement (%): 70 Cervical Position: Middle Station: -2 Presentation: Vertex Exam by::  [place], rn   Musculoskeletal: Normal range of motion. She exhibits tenderness (over left sciatic joint).  Neurological: She is alert and oriented to person, place, and time.  Skin: Skin is warm and dry.  Psychiatric: She has a normal mood and affect.    MAU Course  Procedures   Assessment and Plan  A:  SIUP at [redacted]w[redacted]d       Sciatica      Prodromal contractions without significant cervical change  P:  Discharge home      Discussed sciatica and remedies. Rx Flexeril and Tramadol in limited supply      Discussed signs of labor      Followup with Dr Gaynell Face.   [facility] 06/22/2012, 9:49 PM

## 2012-06-27 ENCOUNTER — Inpatient Hospital Stay (HOSPITAL_COMMUNITY)
Admission: AD | Admit: 2012-06-27 | Discharge: 2012-06-27 | Disposition: A | Discharge status: Home / Self Care | Payer: Medicaid Other | Source: Ambulatory Visit | Attending: Obstetrics | Admitting: Obstetrics

## 2012-06-27 DIAGNOSIS — O479 False labor, unspecified: Secondary | ICD-10-CM | POA: Insufficient documentation

## 2012-06-27 NOTE — MAU Note (Signed)
Patient is in with c/o ctx q49m and having her "mucus plug" come out. She reports good fetal movement, denies vaginal bleeding or lof.

## 2012-06-27 NOTE — Progress Notes (Signed)
Dr Gaynell Face returned call and was notified that of patient, her complaints of painful, frequent contractions and mucus plug. He is aware of tracing, ctx pattern, sve result and sve result. Order to discharge home.

## 2012-07-02 ENCOUNTER — Encounter (HOSPITAL_COMMUNITY): Payer: Self-pay | Admitting: *Deleted

## 2012-07-02 ENCOUNTER — Inpatient Hospital Stay (HOSPITAL_COMMUNITY)
Admission: AD | Admit: 2012-07-02 | Discharge: 2012-07-03 | Disposition: A | Discharge status: Home / Self Care | Payer: Medicaid Other | Source: Ambulatory Visit | Attending: Obstetrics | Admitting: Obstetrics

## 2012-07-02 DIAGNOSIS — O479 False labor, unspecified: Secondary | ICD-10-CM | POA: Insufficient documentation

## 2012-07-02 NOTE — MAU Note (Signed)
Pt presents for contractions that started at 0200 this am and are now 3-33minutes apart.  Denies any LOF or bleeding.  Reports good fetal movement.

## 2012-07-02 NOTE — MAU Note (Signed)
Contractions all day, "today is my due date",

## 2012-07-03 ENCOUNTER — Encounter (HOSPITAL_COMMUNITY): Payer: Self-pay | Admitting: *Deleted

## 2012-07-03 ENCOUNTER — Encounter (HOSPITAL_COMMUNITY): Payer: Self-pay | Admitting: Anesthesiology

## 2012-07-03 ENCOUNTER — Encounter (HOSPITAL_COMMUNITY): Payer: Self-pay | Admitting: Family Medicine

## 2012-07-03 ENCOUNTER — Inpatient Hospital Stay (HOSPITAL_COMMUNITY)
Admission: AD | Admit: 2012-07-03 | Discharge: 2012-07-05 | DRG: 775 | Disposition: A | Discharge status: Home / Self Care | Payer: Medicaid Other | Source: Ambulatory Visit | Attending: Obstetrics | Admitting: Obstetrics

## 2012-07-03 ENCOUNTER — Inpatient Hospital Stay (HOSPITAL_COMMUNITY): Payer: Medicaid Other | Admitting: Anesthesiology

## 2012-07-03 DIAGNOSIS — O99892 Other specified diseases and conditions complicating childbirth: Secondary | ICD-10-CM | POA: Diagnosis present

## 2012-07-03 DIAGNOSIS — Z22330 Carrier of Group B streptococcus: Secondary | ICD-10-CM

## 2012-07-03 DIAGNOSIS — R030 Elevated blood-pressure reading, without diagnosis of hypertension: Secondary | ICD-10-CM | POA: Diagnosis present

## 2012-07-03 LAB — CBC
MCH: 23 pg — ABNORMAL LOW (ref 26.0–34.0)
Platelets: 323 10*3/uL (ref 150–400)
RBC: 4.65 MIL/uL (ref 3.87–5.11)
WBC: 13 10*3/uL — ABNORMAL HIGH (ref 4.0–10.5)

## 2012-07-03 LAB — RPR: RPR Ser Ql: NONREACTIVE

## 2012-07-03 MED ORDER — ZOLPIDEM TARTRATE 5 MG PO TABS: 5.0000 mg | ORAL_TABLET | Freq: Every evening | ORAL | Status: DC | PRN

## 2012-07-03 MED ORDER — PHENYLEPHRINE 40 MCG/ML (10ML) SYRINGE FOR IV PUSH (FOR BLOOD PRESSURE SUPPORT): 80.0000 ug | PREFILLED_SYRINGE | INTRAVENOUS | Status: DC | PRN

## 2012-07-03 MED ORDER — ONDANSETRON HCL 4 MG/2ML IJ SOLN: 4.0000 mg | Freq: Four times a day (QID) | INTRAMUSCULAR | Status: DC | PRN

## 2012-07-03 MED ORDER — ONDANSETRON HCL 4 MG PO TABS: 4.0000 mg | ORAL_TABLET | ORAL | Status: DC | PRN

## 2012-07-03 MED ORDER — OXYTOCIN 40 UNITS IN LACTATED RINGERS INFUSION - SIMPLE MED
1.0000 m[IU]/min | INTRAVENOUS | Status: DC
  Administered 2012-07-03: 1 m[IU]/min via INTRAVENOUS
  Filled 2012-07-03: qty 1000

## 2012-07-03 MED ORDER — PENICILLIN G POTASSIUM 5000000 UNITS IJ SOLR
5.0000 10*6.[IU] | Freq: Once | INTRAVENOUS | Status: AC
  Administered 2012-07-03: 5 10*6.[IU] via INTRAVENOUS
  Filled 2012-07-03: qty 5

## 2012-07-03 MED ORDER — LIDOCAINE HCL (PF) 1 % IJ SOLN: 30.0000 mL | INTRAMUSCULAR | Status: DC | PRN

## 2012-07-03 MED ORDER — SENNOSIDES-DOCUSATE SODIUM 8.6-50 MG PO TABS
2.0000 | ORAL_TABLET | Freq: Every day | ORAL | Status: DC
  Administered 2012-07-03 – 2012-07-04 (×2): 2 via ORAL

## 2012-07-03 MED ORDER — FENTANYL 2.5 MCG/ML BUPIVACAINE 1/10 % EPIDURAL INFUSION (WH - ANES)
14.0000 mL/h | INTRAMUSCULAR | Status: DC
  Administered 2012-07-03 (×2): 14 mL/h via EPIDURAL
  Filled 2012-07-03 (×3): qty 60

## 2012-07-03 MED ORDER — WITCH HAZEL-GLYCERIN EX PADS: 1.0000 "application " | MEDICATED_PAD | CUTANEOUS | Status: DC | PRN

## 2012-07-03 MED ORDER — IBUPROFEN 600 MG PO TABS
600.0000 mg | ORAL_TABLET | Freq: Four times a day (QID) | ORAL | Status: DC | PRN
  Filled 2012-07-03 (×5): qty 1

## 2012-07-03 MED ORDER — PHENYLEPHRINE 40 MCG/ML (10ML) SYRINGE FOR IV PUSH (FOR BLOOD PRESSURE SUPPORT)
80.0000 ug | PREFILLED_SYRINGE | INTRAVENOUS | Status: DC | PRN
  Filled 2012-07-03: qty 5

## 2012-07-03 MED ORDER — BUTORPHANOL TARTRATE 1 MG/ML IJ SOLN
1.0000 mg | Freq: Every day | INTRAMUSCULAR | Status: DC | PRN
  Administered 2012-07-03: 1 mg via INTRAVENOUS
  Filled 2012-07-03: qty 1

## 2012-07-03 MED ORDER — LIDOCAINE HCL (PF) 1 % IJ SOLN
INTRAMUSCULAR | Status: DC | PRN
  Administered 2012-07-03 (×2): 4 mL

## 2012-07-03 MED ORDER — TETANUS-DIPHTH-ACELL PERTUSSIS 5-2.5-18.5 LF-MCG/0.5 IM SUSP
0.5000 mL | Freq: Once | INTRAMUSCULAR | Status: AC
  Administered 2012-07-04: 0.5 mL via INTRAMUSCULAR
  Filled 2012-07-03: qty 0.5

## 2012-07-03 MED ORDER — TERBUTALINE SULFATE 1 MG/ML IJ SOLN: 0.2500 mg | Freq: Once | INTRAMUSCULAR | Status: DC | PRN

## 2012-07-03 MED ORDER — INFLUENZA VIRUS VACC SPLIT PF IM SUSP
0.5000 mL | INTRAMUSCULAR | Status: AC
  Administered 2012-07-04: 0.5 mL via INTRAMUSCULAR
  Filled 2012-07-03: qty 0.5

## 2012-07-03 MED ORDER — PRENATAL MULTIVITAMIN CH
1.0000 | ORAL_TABLET | Freq: Every day | ORAL | Status: DC
  Administered 2012-07-04 – 2012-07-05 (×2): 1 via ORAL
  Filled 2012-07-03 (×2): qty 1

## 2012-07-03 MED ORDER — LANOLIN HYDROUS EX OINT: TOPICAL_OINTMENT | CUTANEOUS | Status: DC | PRN

## 2012-07-03 MED ORDER — MAGNESIUM SULFATE 40 G IN LACTATED RINGERS - SIMPLE
2.0000 g/h | INTRAVENOUS | Status: AC
  Administered 2012-07-04: 2 g/h via INTRAVENOUS
  Filled 2012-07-03 (×2): qty 500

## 2012-07-03 MED ORDER — LACTATED RINGERS IV SOLN
INTRAVENOUS | Status: DC
  Administered 2012-07-03: 21:00:00 via INTRAVENOUS

## 2012-07-03 MED ORDER — PENICILLIN G POTASSIUM 5000000 UNITS IJ SOLR
2.5000 10*6.[IU] | INTRAVENOUS | Status: DC
  Administered 2012-07-03: 2.5 10*6.[IU] via INTRAVENOUS
  Filled 2012-07-03 (×6): qty 2.5

## 2012-07-03 MED ORDER — ACETAMINOPHEN 325 MG PO TABS: 650.0000 mg | ORAL_TABLET | ORAL | Status: DC | PRN

## 2012-07-03 MED ORDER — OXYCODONE-ACETAMINOPHEN 5-325 MG PO TABS
1.0000 | ORAL_TABLET | ORAL | Status: DC | PRN
  Administered 2012-07-03 (×3): 1 via ORAL
  Filled 2012-07-03: qty 1
  Filled 2012-07-03: qty 2
  Filled 2012-07-03: qty 1

## 2012-07-03 MED ORDER — ONDANSETRON HCL 4 MG/2ML IJ SOLN: 4.0000 mg | INTRAMUSCULAR | Status: DC | PRN

## 2012-07-03 MED ORDER — EPHEDRINE 5 MG/ML INJ
10.0000 mg | INTRAVENOUS | Status: DC | PRN
  Filled 2012-07-03: qty 4

## 2012-07-03 MED ORDER — LACTATED RINGERS IV SOLN: 500.0000 mL | INTRAVENOUS | Status: DC | PRN

## 2012-07-03 MED ORDER — MAGNESIUM SULFATE BOLUS VIA INFUSION
4.0000 g | Freq: Once | INTRAVENOUS | Status: AC
  Administered 2012-07-03: 4 g via INTRAVENOUS
  Filled 2012-07-03: qty 500

## 2012-07-03 MED ORDER — FENTANYL 2.5 MCG/ML BUPIVACAINE 1/10 % EPIDURAL INFUSION (WH - ANES)
INTRAMUSCULAR | Status: DC | PRN
  Administered 2012-07-03: 14 mL/h via EPIDURAL

## 2012-07-03 MED ORDER — OXYCODONE-ACETAMINOPHEN 5-325 MG PO TABS: 1.0000 | ORAL_TABLET | ORAL | Status: DC | PRN

## 2012-07-03 MED ORDER — OXYTOCIN BOLUS FROM INFUSION
500.0000 mL | Freq: Once | INTRAVENOUS | Status: AC
  Administered 2012-07-03: 500 mL via INTRAVENOUS
  Filled 2012-07-03: qty 500

## 2012-07-03 MED ORDER — SIMETHICONE 80 MG PO CHEW: 80.0000 mg | CHEWABLE_TABLET | ORAL | Status: DC | PRN

## 2012-07-03 MED ORDER — IBUPROFEN 600 MG PO TABS
600.0000 mg | ORAL_TABLET | Freq: Four times a day (QID) | ORAL | Status: DC
  Administered 2012-07-03 – 2012-07-05 (×7): 600 mg via ORAL
  Filled 2012-07-03 (×2): qty 1

## 2012-07-03 MED ORDER — DIPHENHYDRAMINE HCL 25 MG PO CAPS: 25.0000 mg | ORAL_CAPSULE | Freq: Four times a day (QID) | ORAL | Status: DC | PRN

## 2012-07-03 MED ORDER — DIPHENHYDRAMINE HCL 50 MG/ML IJ SOLN: 12.5000 mg | INTRAMUSCULAR | Status: DC | PRN

## 2012-07-03 MED ORDER — DIBUCAINE 1 % RE OINT: 1.0000 "application " | TOPICAL_OINTMENT | RECTAL | Status: DC | PRN

## 2012-07-03 MED ORDER — EPHEDRINE 5 MG/ML INJ: 10.0000 mg | INTRAVENOUS | Status: DC | PRN

## 2012-07-03 MED ORDER — LACTATED RINGERS IV SOLN
INTRAVENOUS | Status: DC
  Administered 2012-07-03: 10:00:00 via INTRAVENOUS

## 2012-07-03 MED ORDER — OXYTOCIN 40 UNITS IN LACTATED RINGERS INFUSION - SIMPLE MED: 62.5000 mL/h | Freq: Once | INTRAVENOUS | Status: DC

## 2012-07-03 MED ORDER — FERROUS SULFATE 325 (65 FE) MG PO TABS
325.0000 mg | ORAL_TABLET | Freq: Two times a day (BID) | ORAL | Status: DC
  Administered 2012-07-03 – 2012-07-05 (×4): 325 mg via ORAL
  Filled 2012-07-03 (×4): qty 1

## 2012-07-03 MED ORDER — CITRIC ACID-SODIUM CITRATE 334-500 MG/5ML PO SOLN: 30.0000 mL | ORAL | Status: DC | PRN

## 2012-07-03 MED ORDER — LACTATED RINGERS IV SOLN
500.0000 mL | Freq: Once | INTRAVENOUS | Status: AC
  Administered 2012-07-03: 500 mL via INTRAVENOUS

## 2012-07-03 MED ORDER — BENZOCAINE-MENTHOL 20-0.5 % EX AERO: 1.0000 "application " | INHALATION_SPRAY | CUTANEOUS | Status: DC | PRN

## 2012-07-03 NOTE — Progress Notes (Signed)
MD notified that pt arrived with elevated BPs and BPs have remained elevated after epidural placement.

## 2012-07-03 NOTE — Anesthesia Preprocedure Evaluation (Signed)

## 2012-07-03 NOTE — H&P (Signed)
This is Dr. Francoise Ceo dictating the history and physical on  [name] [name] she's a 21 year old gravida 1 at 40 weeks and 1 day her EDC is 07/02/2012 and she was admitted in labor positive GBS treated with penicillin her cervix is now 4 cm 90% with the vertex at -2 to -3 station amniotomy was performed the fluids clear her blood pressures were also noted to be elevated so she was started on magnesium sulfate 4 g loading and 2 g per hour Past medical history negative Past surgical history negative Social history negative System review noncontributory Physical exam revealed a well-developed female in her in labor HEENT negative Breasts negative Heart regular rhythm no murmurs no gallops Lungs lungs clear to P&A Abdomen term Pelvic as described above Extremities negative

## 2012-07-03 NOTE — MAU Note (Signed)
PT  HAS RETURNED  HOLLERING- SAID UC BECAME STRONGER WHEN AT HOME.

## 2012-07-03 NOTE — MAU Note (Signed)
COMPUTERS  WENT DOWN WHEN D/C PT- SHE UNABLE TO SIGN E-  SIGNATURE

## 2012-07-03 NOTE — Anesthesia Procedure Notes (Signed)
Epidural Patient location during procedure: OB Start time: 07/03/2012 3:57 AM  Staffing Anesthesiologist: Cayle Thunder A. Performed by: anesthesiologist   Preanesthetic Checklist Completed: patient identified, site marked, surgical consent, pre-op evaluation, timeout performed, IV checked, risks and benefits discussed and monitors and equipment checked  Epidural Patient position: sitting Prep: site prepped and draped and DuraPrep Patient monitoring: continuous pulse ox and blood pressure Approach: midline Injection technique: LOR air  Needle:  Needle type: Tuohy  Needle gauge: 17 G Needle length: 9 cm and 9 Needle insertion depth: 5 cm cm Catheter type: closed end flexible Catheter size: 19 Gauge Catheter at skin depth: 10 cm Test dose: negative  Assessment Events: blood not aspirated, injection not painful, no injection resistance, negative IV test and no paresthesia  Additional Notes Patient identified. Risks and benefits discussed including failed block, incomplete  Pain control, post dural puncture headache, nerve damage, paralysis, blood pressure Changes, nausea, vomiting, reactions to medications-both toxic and allergic and post Partum back pain. All questions were answered. Patient expressed understanding and wished to proceed. Sterile technique was used throughout procedure. Epidural site was Dressed with sterile barrier dressing. No paresthesias, signs of intravascular injection Or signs of intrathecal spread were encountered.  Patient was more comfortable after the epidural was dosed. Please see RN's note for documentation of vital signs and FHR which are stable.

## 2012-07-04 LAB — CBC
HCT: 30.5 % — ABNORMAL LOW (ref 36.0–46.0)
Hemoglobin: 9.8 g/dL — ABNORMAL LOW (ref 12.0–15.0)
MCH: 23 pg — ABNORMAL LOW (ref 26.0–34.0)
MCV: 71.6 fL — ABNORMAL LOW (ref 78.0–100.0)
RBC: 4.26 MIL/uL (ref 3.87–5.11)

## 2012-07-04 NOTE — Progress Notes (Signed)
UR chart review completed.  

## 2012-07-04 NOTE — Progress Notes (Signed)
Patient ID: [name] [name], female   DOB: 1990/10/10, 21 y.o.   MRN: [id number] Postpartum day one Vital signs normal Fundus firm Legs negative Blood pressure is stable will discontinue magnesium sulfate today and transfer if stable

## 2012-07-04 NOTE — Anesthesia Postprocedure Evaluation (Signed)
  Anesthesia Post Note  Patient: [name] [name]  Procedure(s) Performed: * No procedures listed *  Anesthesia type: Epidural  Patient location: AICU  Post pain: Pain level controlled  Post assessment: Post-op Vital signs reviewed  Last Vitals:  Filed Vitals:   07/04/12 0700  BP: 132/76  Pulse: 100  Temp:   Resp:     Post vital signs: Reviewed  Level of consciousness:alert  Complications: No apparent anesthesia complications

## 2012-07-05 NOTE — Progress Notes (Signed)
Patient ID: [name] [name], female   DOB: [birth year], 21 y.o.   MRN: [id number] Postpartum day 2 Vital signs normal Fundus firm Legs negative Home today

## 2012-07-05 NOTE — Discharge Summary (Signed)
Obstetric Discharge Summary Reason for Admission: onset of labor Prenatal Procedures: none Intrapartum Procedures: spontaneous vaginal delivery Postpartum Procedures: none Complications-Operative and Postpartum: none Hemoglobin  Date Value Range Status  07/04/2012 9.8* 12.0 - 15.0 g/dL Final     HCT  Date Value Range Status  07/04/2012 30.5* 36.0 - 46.0 % Final    Physical Exam:  General: alert Lochia: appropriate Uterine Fundus: firm Incision: healing well DVT Evaluation: No evidence of DVT seen on physical exam.  Discharge Diagnoses: Term Pregnancy-delivered  Discharge Information: Date: 07/05/2012 Activity: pelvic rest Diet: routine Medications: Percocet Condition: stable Instructions: refer to practice specific booklet Discharge to: home Follow-up Information    Call in 6 weeks to follow up.   Contact information:   b Icie Kuznicki         Newborn Data: Live born female  Birth Weight: 5 lb 7.1 oz (2469 g) APGAR: 9, 9  Home with mother.  [name] [name] A 07/05/2012, 7:38 AM

## 2012-07-10 ENCOUNTER — Ambulatory Visit (HOSPITAL_COMMUNITY): Payer: Medicaid Other

## 2012-07-24 ENCOUNTER — Inpatient Hospital Stay (HOSPITAL_COMMUNITY): Payer: Medicaid Other

## 2012-07-24 ENCOUNTER — Inpatient Hospital Stay (HOSPITAL_COMMUNITY)
Admission: AD | Admit: 2012-07-24 | Discharge: 2012-07-25 | Discharge status: Against Medical Advice | Payer: Medicaid Other | Source: Ambulatory Visit | Attending: Obstetrics | Admitting: Obstetrics

## 2012-07-24 DIAGNOSIS — I493 Ventricular premature depolarization: Secondary | ICD-10-CM

## 2012-07-24 DIAGNOSIS — R0602 Shortness of breath: Secondary | ICD-10-CM

## 2012-07-24 DIAGNOSIS — O99893 Other specified diseases and conditions complicating puerperium: Secondary | ICD-10-CM | POA: Insufficient documentation

## 2012-07-24 NOTE — MAU Note (Signed)
Although pt says she is totally fine now and without any discomforts, she has experienced these symptoms of SOB, and irregular Heart beat every other day for the past three weeks.

## 2012-07-24 NOTE — MAU Provider Note (Signed)
History     CSN: [id number]  Arrival date and time: 07/24/12 2221   First Provider Initiated Contact with Patient 07/24/12 2248      No chief complaint on file.  HPI [name] [name] is a 21 y.o. female who presents to MAU for shortness of breath. The symptoms have been on going since she delivered her baby 2 weeks ago. Associated symptoms include rapid heart rate, palpations and chest discomfort. The episodes have become more frequent so she decided to come in tonight. The history was provided by the patient.  OB History    Grav Para Term Preterm Abortions TAB SAB Ect Mult Living   1 1 1  0 0 0 0 0 0 1      Past Medical History  Diagnosis Date  . Chlamydia 10-2011  . Infection   . Headache     Past Surgical History  Procedure Date  . No past surgeries     Family History  Problem Relation Age of Onset  . Anesthesia problems Neg Hx   . Other Neg Hx     History  Substance Use Topics  . Smoking status: Current Every Day Smoker -- 0.2 packs/day for 3 years    Types: Cigarettes  . Smokeless tobacco: Never Used  . Alcohol Use: No    Allergies:  Allergies  Allergen Reactions  . Metronidazole Nausea And Vomiting    No prescriptions prior to admission    Review of Systems  Constitutional: Negative for fever and chills.  Eyes: Negative.   Respiratory: Positive for shortness of breath. Negative for cough and wheezing.   Cardiovascular: Positive for chest pain and palpitations.  Gastrointestinal: Negative for nausea, vomiting and abdominal pain.  Genitourinary: Negative for dysuria and frequency.  Musculoskeletal: Negative for back pain.  Neurological: Negative for dizziness and headaches.  Psychiatric/Behavioral: Negative for depression. The patient is not nervous/anxious.    Physical Exam   Blood pressure 149/82, pulse 104, temperature 97.7 F (36.5 C), temperature source Oral, resp. rate 16, height 5\' 4"  (1.626 m), weight 164 lb (74.39 kg), SpO2  100.00%.  Physical Exam  Nursing note and vitals reviewed. Constitutional: She is oriented to person, place, and time. She appears well-developed and well-nourished. No distress.  HENT:  Head: Normocephalic and atraumatic.  Eyes: EOM are normal.  Neck: Neck supple.  Cardiovascular: Normal rate.        Occasional PVC  Respiratory: Effort normal and breath sounds normal.  GI: Soft. There is no tenderness.  Musculoskeletal: Normal range of motion.  Neurological: She is alert and oriented to person, place, and time. She has normal strength. No cranial nerve deficit or sensory deficit.  Skin: Skin is warm and dry.  Psychiatric: She has a normal mood and affect. Her behavior is normal. Judgment and thought content normal.   Dg Chest 2 View  07/25/2012  *RADIOLOGY REPORT*  Clinical Data: Shortness of breath  CHEST - 2 VIEW  Comparison: None.  Findings: Mild left lung base opacity. Lungs are otherwise clear. No pleural effusion or pneumothorax. The cardiomediastinal contours are within normal limits. The visualized bones and soft tissues are without significant appreciable abnormality.  IMPRESSION: Mild left lung base opacity is likely atelectasis. Otherwise, no acute process identified.   Original Report Authenticated By: Waneta Martins, M.D.    EKG = rate 87, sinus rhythm with occasional PVC  Dr. Gaynell Face notified and will plan for CT of chest and then transfer patient to [facility] for further evaluation.  MAU  Course: @ 0135 patient states she must leave. She will sign out AMA. Dr. Gaynell Face notified. Risk of signing out AMA reviewed with patient.   Procedures  NEESE,HOPE, RN, FNP, [facility] 07/24/2012, 11:32 PM

## 2012-07-24 NOTE — MAU Note (Signed)
Pt delivered SVD Oct 1st. Since then she has experienced irregular heart beat, high blood pressure, and SOB and nausea.

## 2012-07-25 ENCOUNTER — Inpatient Hospital Stay (HOSPITAL_COMMUNITY): Payer: Medicaid Other

## 2012-07-25 MED ORDER — SODIUM CHLORIDE 0.9 % IV SOLN
INTRAVENOUS | Status: DC
  Administered 2012-07-25: 01:00:00 via INTRAVENOUS

## 2012-09-15 ENCOUNTER — Emergency Department (HOSPITAL_COMMUNITY)
Admission: EM | Admit: 2012-09-15 | Discharge: 2012-09-15 | Disposition: A | Discharge status: Home / Self Care | Payer: Self-pay | Attending: Emergency Medicine | Admitting: Emergency Medicine

## 2012-09-15 ENCOUNTER — Encounter (HOSPITAL_COMMUNITY): Payer: Self-pay | Admitting: Physical Medicine and Rehabilitation

## 2012-09-15 DIAGNOSIS — N760 Acute vaginitis: Secondary | ICD-10-CM

## 2012-09-15 DIAGNOSIS — N898 Other specified noninflammatory disorders of vagina: Secondary | ICD-10-CM | POA: Insufficient documentation

## 2012-09-15 DIAGNOSIS — F17200 Nicotine dependence, unspecified, uncomplicated: Secondary | ICD-10-CM | POA: Insufficient documentation

## 2012-09-15 DIAGNOSIS — Z202 Contact with and (suspected) exposure to infections with a predominantly sexual mode of transmission: Secondary | ICD-10-CM

## 2012-09-15 LAB — WET PREP, GENITAL: Yeast Wet Prep HPF POC: NONE SEEN

## 2012-09-15 LAB — URINALYSIS, ROUTINE W REFLEX MICROSCOPIC
Glucose, UA: NEGATIVE mg/dL
Ketones, ur: 15 mg/dL — AB
Leukocytes, UA: NEGATIVE
Specific Gravity, Urine: 1.031 — ABNORMAL HIGH (ref 1.005–1.030)
pH: 6.5 (ref 5.0–8.0)

## 2012-09-15 LAB — URINE MICROSCOPIC-ADD ON

## 2012-09-15 MED ORDER — AZITHROMYCIN 250 MG PO TABS
1000.0000 mg | ORAL_TABLET | Freq: Once | ORAL | Status: AC
  Administered 2012-09-15: 1000 mg via ORAL
  Filled 2012-09-15: qty 4

## 2012-09-15 MED ORDER — CEFTRIAXONE SODIUM 250 MG IJ SOLR
250.0000 mg | Freq: Once | INTRAMUSCULAR | Status: AC
  Administered 2012-09-15: 250 mg via INTRAMUSCULAR
  Filled 2012-09-15: qty 250

## 2012-09-15 NOTE — ED Provider Notes (Signed)
History  Scribed for Suzi Roots, MD, the patient was seen in room TR08C/TR08C. This chart was scribed by Candelaria Stagers. The patient's care started at 2:25 PM   CSN: [id number]  Arrival date & time 09/15/12  1316   First MD Initiated Contact with Patient 09/15/12 1422      Chief Complaint  Patient presents with  . Vaginal Pain     The history is provided by the patient. No language interpreter was used.   [name] [name] is a 21 y.o. female who presents to the Emergency Department complaining of vaginal pain that started about two weeks ago.  She reports that her partner gave her chlamydia and she has not been treated.  Pt reports yellow discharge.  Waxes and wanes, sometime clear, denies specific exacerbating or alleviating factors. Pt is two months post partum with her last period three weeks ago.  She denies dysuria, vomiting, or diarrhea.  Nothing seems to make the sx better or worse. No abd or pelvic pain. No fever or chills.      Past Medical History  Diagnosis Date  . Chlamydia 10-2011  . Infection   . Headache     Past Surgical History  Procedure Date  . No past surgeries     Family History  Problem Relation Age of Onset  . Anesthesia problems Neg Hx   . Other Neg Hx     History  Substance Use Topics  . Smoking status: Current Every Day Smoker -- 0.2 packs/day for 3 years    Types: Cigarettes  . Smokeless tobacco: Never Used  . Alcohol Use: No    OB History    Grav Para Term Preterm Abortions TAB SAB Ect Mult Living   1 1 1  0 0 0 0 0 0 1      Review of Systems  Gastrointestinal: Negative for nausea and vomiting.  Genitourinary: Positive for vaginal discharge and vaginal pain. Negative for dysuria.  All other systems reviewed and are negative.    Allergies  Metronidazole  Home Medications   Current Outpatient Rx  Name  Route  Sig  Dispense  Refill  . ETONOGESTREL-ETHINYL ESTRADIOL 0.12-0.015 MG/24HR VA RING   Vaginal   Place 1 each vaginally  every 28 (twenty-eight) days. Insert vaginally and leave in place for 3 consecutive weeks, then remove for 1 week.           There were no vitals taken for this visit.  Physical Exam  Nursing note and vitals reviewed. Constitutional: She is oriented to person, place, and time. She appears well-developed and well-nourished. No distress.  HENT:  Head: Normocephalic and atraumatic.  Eyes: Conjunctivae normal are normal.  Neck: Neck supple. No tracheal deviation present.  Cardiovascular: Normal rate.   Pulmonary/Chest: Effort normal. No respiratory distress.  Abdominal: Soft. She exhibits no distension. There is no tenderness.  Genitourinary:       No cva tenderness Mild vaginal discharge. No cmt. Cervix closed. No adx masses/focal tenderness.   Musculoskeletal: Normal range of motion.  Neurological: She is alert and oriented to person, place, and time.  Skin: Skin is warm and dry.  Psychiatric: She has a normal mood and affect. Her behavior is normal.    ED Course  Procedures   DIAGNOSTIC STUDIES:    COORDINATION OF CARE:  14:32 Ordered: GC/Chlamydia Probe Amp; Wet prep, genital 13:42 Ordered: Pregnancy, urine POC 13:27 Ordered: Urinalysis, Routine w reflex microscopic; Urine microscopic-add on   Results for orders placed during  the hospital encounter of 09/15/12  URINALYSIS, ROUTINE W REFLEX MICROSCOPIC      Component Value Range   Color, Urine AMBER (*) YELLOW   APPearance CLEAR  CLEAR   Specific Gravity, Urine 1.031 (*) 1.005 - 1.030   pH 6.5  5.0 - 8.0   Glucose, UA NEGATIVE  NEGATIVE mg/dL   Hgb urine dipstick NEGATIVE  NEGATIVE   Bilirubin Urine SMALL (*) NEGATIVE   Ketones, ur 15 (*) NEGATIVE mg/dL   Protein, ur 30 (*) NEGATIVE mg/dL   Urobilinogen, UA 1.0  0.0 - 1.0 mg/dL   Nitrite NEGATIVE  NEGATIVE   Leukocytes, UA NEGATIVE  NEGATIVE  POCT PREGNANCY, URINE      Component Value Range   Preg Test, Ur NEGATIVE  NEGATIVE  URINE MICROSCOPIC-ADD ON       Component Value Range   Squamous Epithelial / LPF FEW (*) RARE   Bacteria, UA FEW (*) RARE   Urine-Other MUCOUS PRESENT        MDM  I personally performed the services described in this documentation, which was scribed in my presence. The recorded information has been reviewed and is accurate.   Labs.  Pt states was exposed to chlamydia - will rx rocephin and zithromax.    Rocephin im, zithromax po.       Suzi Roots, MD 09/17/12 [phone]

## 2012-09-15 NOTE — ED Notes (Signed)
Pt presents to department for evaluation of vaginal pain. Ongoing x2 weeks. States she thinks she could have STD from unprotected sex with partner. States yellow colored vaginal discharge. 2/10 pain at the time. 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 She is alert and oriented x4.

## 2012-09-15 NOTE — ED Notes (Signed)
Pt reports that her cervix is hurting. Pt reports that when she was pregnant she found out that she had HPV and had another smear done 6 months later and still had some abnormal cells. Pt reports she also found out a week ago that her sexual partner has chlamydia and hasn't gotten tested yet. Pt reports having some yellow and clear vaginal discharge, denies painful urination.

## 2012-09-15 NOTE — ED Notes (Signed)
Spoke with lab, they report they haven't received the GC/chlamydia or wet prep yet.

## 2012-09-16 LAB — GC/CHLAMYDIA PROBE AMP: GC Probe RNA: NEGATIVE

## 2013-10-03 NOTE — L&D Delivery Note (Signed)
Delivery Note Pt progressed rapidly after AROM of forebag, developed strong urge to push, and after pushing w/ 2uc's, at 3:11 PM a viable female was delivered via Vaginal, Spontaneous Delivery (Presentation:Right Occiput Anterior) w/ compound posterior (Right) hand.  Body cord around abdomen, reduced after birth. APGAR: 9, 9; weight: pending . Infant placed directly on mom's abdomen for bonding/skin-to-skin. Cord allowed to stop pulsing, and was clamped x 2, and cut by pt's family member/friend.     Placenta status: spontaneously, intact.  Cord: 3 vessels with the following complications: None.    Anesthesia: Epidural  Episiotomy: None Lacerations: shallow hemostatic 1st degree Lt periurethral- no repair needed Suture Repair: n/a Est. Blood Loss (mL): 250ml  Mom to postpartum.  Baby to Couplet care / Skin to Skin. Plans to breast/bottlefeed Undecided about contraception, discussed options  [name], [name] [name] [name] [name] 12/14/2013, 3:28 PM

## 2013-11-02 ENCOUNTER — Inpatient Hospital Stay (HOSPITAL_COMMUNITY)
Admission: AD | Admit: 2013-11-02 | Discharge: 2013-11-02 | Disposition: A | Discharge status: Home / Self Care | Payer: Medicaid Other | Source: Ambulatory Visit | Attending: Obstetrics and Gynecology | Admitting: Obstetrics and Gynecology

## 2013-11-02 ENCOUNTER — Inpatient Hospital Stay (HOSPITAL_COMMUNITY): Payer: Medicaid Other

## 2013-11-02 ENCOUNTER — Encounter (HOSPITAL_COMMUNITY): Payer: Self-pay

## 2013-11-02 DIAGNOSIS — O0930 Supervision of pregnancy with insufficient antenatal care, unspecified trimester: Secondary | ICD-10-CM

## 2013-11-02 DIAGNOSIS — N760 Acute vaginitis: Secondary | ICD-10-CM

## 2013-11-02 DIAGNOSIS — O368190 Decreased fetal movements, unspecified trimester, not applicable or unspecified: Secondary | ICD-10-CM | POA: Insufficient documentation

## 2013-11-02 DIAGNOSIS — O2390 Unspecified genitourinary tract infection in pregnancy, unspecified trimester: Secondary | ICD-10-CM | POA: Insufficient documentation

## 2013-11-02 DIAGNOSIS — B9689 Other specified bacterial agents as the cause of diseases classified elsewhere: Secondary | ICD-10-CM | POA: Insufficient documentation

## 2013-11-02 DIAGNOSIS — A499 Bacterial infection, unspecified: Secondary | ICD-10-CM

## 2013-11-02 DIAGNOSIS — K5900 Constipation, unspecified: Secondary | ICD-10-CM | POA: Insufficient documentation

## 2013-11-02 DIAGNOSIS — O4690 Antepartum hemorrhage, unspecified, unspecified trimester: Secondary | ICD-10-CM | POA: Insufficient documentation

## 2013-11-02 DIAGNOSIS — R109 Unspecified abdominal pain: Secondary | ICD-10-CM | POA: Insufficient documentation

## 2013-11-02 DIAGNOSIS — O99330 Smoking (tobacco) complicating pregnancy, unspecified trimester: Secondary | ICD-10-CM | POA: Insufficient documentation

## 2013-11-02 DIAGNOSIS — O99019 Anemia complicating pregnancy, unspecified trimester: Secondary | ICD-10-CM | POA: Insufficient documentation

## 2013-11-02 DIAGNOSIS — D649 Anemia, unspecified: Secondary | ICD-10-CM | POA: Insufficient documentation

## 2013-11-02 HISTORY — DX: Palpitations: R00.2

## 2013-11-02 LAB — WET PREP, GENITAL
Trich, Wet Prep: NONE SEEN
Yeast Wet Prep HPF POC: NONE SEEN

## 2013-11-02 LAB — DIFFERENTIAL
BASOS ABS: 0 10*3/uL (ref 0.0–0.1)
Basophils Relative: 0 % (ref 0–1)
EOS PCT: 1 % (ref 0–5)
Eosinophils Absolute: 0.1 10*3/uL (ref 0.0–0.7)
LYMPHS ABS: 2.8 10*3/uL (ref 0.7–4.0)
LYMPHS PCT: 27 % (ref 12–46)
MONOS PCT: 9 % (ref 3–12)
Monocytes Absolute: 0.9 10*3/uL (ref 0.1–1.0)
NEUTROS PCT: 63 % (ref 43–77)
Neutro Abs: 6.4 10*3/uL (ref 1.7–7.7)

## 2013-11-02 LAB — URINALYSIS, ROUTINE W REFLEX MICROSCOPIC
Bilirubin Urine: NEGATIVE
GLUCOSE, UA: NEGATIVE mg/dL
HGB URINE DIPSTICK: NEGATIVE
Ketones, ur: NEGATIVE mg/dL
Nitrite: NEGATIVE
PROTEIN: NEGATIVE mg/dL
SPECIFIC GRAVITY, URINE: 1.025 (ref 1.005–1.030)
UROBILINOGEN UA: 0.2 mg/dL (ref 0.0–1.0)
pH: 7.5 (ref 5.0–8.0)

## 2013-11-02 LAB — CBC
HCT: 31.3 % — ABNORMAL LOW (ref 36.0–46.0)
Hemoglobin: 10.1 g/dL — ABNORMAL LOW (ref 12.0–15.0)
MCH: 22.4 pg — ABNORMAL LOW (ref 26.0–34.0)
MCHC: 32.3 g/dL (ref 30.0–36.0)
MCV: 69.4 fL — ABNORMAL LOW (ref 78.0–100.0)
PLATELETS: 303 10*3/uL (ref 150–400)
RBC: 4.51 MIL/uL (ref 3.87–5.11)
RDW: 15.1 % (ref 11.5–15.5)
WBC: 10.2 10*3/uL (ref 4.0–10.5)

## 2013-11-02 LAB — RAPID URINE DRUG SCREEN, HOSP PERFORMED
AMPHETAMINES: NOT DETECTED
Barbiturates: NOT DETECTED
Benzodiazepines: NOT DETECTED
COCAINE: NOT DETECTED
OPIATES: NOT DETECTED
Tetrahydrocannabinol: NOT DETECTED

## 2013-11-02 LAB — OB RESULTS CONSOLE HIV ANTIBODY (ROUTINE TESTING): HIV: NONREACTIVE

## 2013-11-02 LAB — HEPATITIS B SURFACE ANTIGEN: HEP B S AG: NEGATIVE

## 2013-11-02 LAB — RAPID HIV SCREEN (WH-MAU): SUDS RAPID HIV SCREEN: NONREACTIVE

## 2013-11-02 LAB — RPR: RPR: NONREACTIVE

## 2013-11-02 LAB — TYPE AND SCREEN
ABO/RH(D): O POS
ANTIBODY SCREEN: NEGATIVE

## 2013-11-02 LAB — ABO/RH: ABO/RH(D): O POS

## 2013-11-02 LAB — URINE MICROSCOPIC-ADD ON

## 2013-11-02 MED ORDER — PRENATAL PLUS 27-1 MG PO TABS: 1.0000 | ORAL_TABLET | Freq: Every day | ORAL | Status: DC

## 2013-11-02 MED ORDER — CLINDAMYCIN HCL 300 MG PO CAPS: 300.0000 mg | ORAL_CAPSULE | Freq: Two times a day (BID) | ORAL | Status: DC

## 2013-11-02 NOTE — MAU Note (Signed)
Pt states here for pain in vaginal area. Pelvis is sore. Had vaginal bleeding two days ago. No recent intercourse. Did note odorous vaginal discharge a few days ago that was "bubbly". None since that day.

## 2013-11-02 NOTE — MAU Provider Note (Signed)
Attestation of Attending Supervision of Advanced Practitioner (CNM/NP): Evaluation and management procedures were performed by the Advanced Practitioner under my supervision and collaboration.  I have reviewed the Advanced Practitioner's note and chart, and I agree with the management and plan.  Koua Deeg 11/02/2013 11:08 PM

## 2013-11-02 NOTE — MAU Provider Note (Signed)
History     CSN: [id number]  Arrival date and time: 11/02/13 1711   None     Chief Complaint  Patient presents with  . Vaginal Bleeding  . Abdominal Pain   Vaginal Bleeding Associated symptoms include constipation. Pertinent negatives include no abdominal pain, chills, diarrhea, fever, nausea or vomiting.  Abdominal Pain Associated symptoms include constipation. Pertinent negatives include no diarrhea, fever, nausea or vomiting.    [name] [name] is a 23 y.o. 642P1001 female @ 5658w1d by 6wk [place]/s in [place], who presents w/ report of small 'glob' of blood in underwear 2d ago after carrying her daughter. Had some bleeding earlier in pregnancy as well. Malodorous d/c x months, noticed in was bubbly the other day. Pelvic pain. Constipation. Reports slightly decreased fm x 1wk. Denies lof or uc's.  Reports last sexual intercourse as when she got pregnant. Has not had any prenatal care other than 6wk dating [place] in [place], states [place]. Reports previous term uncomplicated SVD.    OB History   Grav Para Term Preterm Abortions TAB SAB Ect Mult Living   2 1 1  0 0 0 0 0 0 1      Past Medical History  Diagnosis Date  . Chlamydia 10-2011  . Infection   . Headache(784.0)   . Heart palpitations     Past Surgical History  Procedure Laterality Date  . No past surgeries      Family History  Problem Relation Age of Onset  . Anesthesia problems Neg Hx   . Other Neg Hx     History  Substance Use Topics  . Smoking status: Light Tobacco Smoker -- 0.25 packs/day for 3 years    Types: Cigarettes  . Smokeless tobacco: Never Used  . Alcohol Use: No    Allergies:  Allergies  Allergen Reactions  . Metronidazole Nausea And Vomiting    No prescriptions prior to admission    Review of Systems  Constitutional: Negative for fever and chills.  Gastrointestinal: Positive for constipation. Negative for nausea, vomiting, abdominal pain and  diarrhea.  Genitourinary: Negative.   Musculoskeletal: Negative.     Physical Exam   Blood pressure 127/82, pulse 105, temperature 98.2 F (36.8 C), temperature source Oral, resp. rate 18, height 5\' 6"  (1.676 m), weight 80.74 kg (178 lb), not currently breastfeeding.  Physical Exam  Constitutional: She is oriented to person, place, and time. She appears well-developed and well-nourished.  HENT:  Head: Normocephalic.  Cardiovascular: Normal rate.   Respiratory: Effort normal.  GI: Soft. There is no tenderness.  gravid  Genitourinary:  Spec exam: large amount thin frothy malodorous d/c, no bleeding whatsoever, cx visually closed  SVE: LTC, posterior  Musculoskeletal: Normal range of motion.  Neurological: She is alert and oriented to person, place, and time.  Skin: Skin is warm and dry.  Psychiatric: She has a normal mood and affect. Her behavior is normal. Judgment and thought content normal.   FHR: 135, mod variability, 15x15accels, no decels=Cat I UCs: q 2-3 on admission, none after po hydration MAU Course  Procedures  PO hydration Limited u/s to assess placental location prior to pelvic: fhr 138, presentation variable, placenta posterior above os, no definite evidence of acute placental abruption, AFI: 18.88cm, cx: normal appearance by transabdominal scan Spec exam w/ wet prep, gc/ch, gbs, fFN collected but not sent d/t SVE SVE: LTC, posterior OB panel NST UA  Results for orders placed during the hospital encounter of  11/02/13 (from the past 24 hour(s))  URINALYSIS, ROUTINE W REFLEX MICROSCOPIC     Status: Abnormal   Collection Time    11/02/13  5:33 PM      Result Value Range   Color, Urine YELLOW  YELLOW   APPearance CLEAR  CLEAR   Specific Gravity, Urine 1.025  1.005 - 1.030   pH 7.5  5.0 - 8.0   Glucose, UA NEGATIVE  NEGATIVE mg/dL   Hgb urine dipstick NEGATIVE  NEGATIVE   Bilirubin Urine NEGATIVE  NEGATIVE   Ketones, ur NEGATIVE  NEGATIVE mg/dL   Protein,  ur NEGATIVE  NEGATIVE mg/dL   Urobilinogen, UA 0.2  0.0 - 1.0 mg/dL   Nitrite NEGATIVE  NEGATIVE   Leukocytes, UA TRACE (*) NEGATIVE  URINE RAPID DRUG SCREEN (HOSP PERFORMED)     Status: None   Collection Time    11/02/13  5:33 PM      Result Value Range   Opiates NONE DETECTED  NONE DETECTED   Cocaine NONE DETECTED  NONE DETECTED   Benzodiazepines NONE DETECTED  NONE DETECTED   Amphetamines NONE DETECTED  NONE DETECTED   Tetrahydrocannabinol NONE DETECTED  NONE DETECTED   Barbiturates NONE DETECTED  NONE DETECTED  URINE MICROSCOPIC-ADD ON     Status: Abnormal   Collection Time    11/02/13  5:33 PM      Result Value Range   Squamous Epithelial / LPF FEW (*) RARE   WBC, UA 3-6  <3 WBC/hpf   Urine-Other MUCOUS PRESENT    WET PREP, GENITAL     Status: Abnormal   Collection Time    11/02/13  6:00 PM      Result Value Range   Yeast Wet Prep HPF POC NONE SEEN  NONE SEEN   Trich, Wet Prep NONE SEEN  NONE SEEN   Clue Cells Wet Prep HPF POC MANY (*) NONE SEEN   WBC, Wet Prep HPF POC MODERATE (*) NONE SEEN  CBC     Status: Abnormal   Collection Time    11/02/13  6:20 PM      Result Value Range   WBC 10.2  4.0 - 10.5 K/uL   RBC 4.51  3.87 - 5.11 MIL/uL   Hemoglobin 10.1 (*) 12.0 - 15.0 g/dL   HCT 40.9 (*) 81.1 - 91.4 %   MCV 69.4 (*) 78.0 - 100.0 fL   MCH 22.4 (*) 26.0 - 34.0 pg   MCHC 32.3  30.0 - 36.0 g/dL   RDW 78.2  95.6 - 21.3 %   Platelets 303  150 - 400 K/uL  DIFFERENTIAL     Status: None   Collection Time    11/02/13  6:20 PM      Result Value Range   Neutrophils Relative % 63  43 - 77 %   Lymphocytes Relative 27  12 - 46 %   Monocytes Relative 9  3 - 12 %   Eosinophils Relative 1  0 - 5 %   Basophils Relative 0  0 - 1 %   Neutro Abs 6.4  1.7 - 7.7 K/uL   Lymphs Abs 2.8  0.7 - 4.0 K/uL   Monocytes Absolute 0.9  0.1 - 1.0 K/uL   Eosinophils Absolute 0.1  0.0 - 0.7 K/uL   Basophils Absolute 0.0  0.0 - 0.1 K/uL  TYPE AND SCREEN     Status: None   Collection Time     11/02/13  6:20 PM  Result Value Range   ABO/RH(D) O POS     Antibody Screen NEG     Sample Expiration 11/05/2013    RAPID HIV SCREEN ([facility])     Status: None   Collection Time    11/02/13  6:20 PM      Result Value Range   SUDS Rapid HIV Screen NON REACTIVE  NON REACTIVE     Assessment and Plan  A:   [redacted]w[redacted]d SIUP  G2P1001   No pnc  BV  Constipation  Small amt vag bleed 2d ago, none since- poss cervical irritation from BV  Preterm uc's upon arrival d/t probable dehydration- resolved w/ po hydration  Mild anemia, not currently taking pnv  P:  D/C home  Rx cleocin 300mg  bid po x 7d (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

## 2013-11-02 NOTE — Discharge Instructions (Signed)
Constipation  Drink plenty of fluid, preferably water, throughout the day  Eat foods high in fiber such as fruits, vegetables, and grains  Exercise, such as walking, is a good way to keep your bowels regular  Drink warm fluids, especially warm prune juice, or decaf coffee  Eat a 1/2 cup of real oatmeal (not instant), 1/2 cup applesauce, and 1/2-1 cup warm prune juice every day  If needed, you may take Colace (docusate sodium) stool softener once or twice a day to help keep the stool soft. If you are pregnant, wait until you are out of your first trimester (12-14 weeks of pregnancy)  If you still are having problems with constipation, you may take Miralax once daily as needed to help keep your bowels regular.  If you are pregnant, wait until you are out of your first trimester (12-14 weeks of pregnancy)   Bacterial Vaginosis Bacterial vaginosis is a vaginal infection that occurs when the normal balance of bacteria in the vagina is disrupted. It results from an overgrowth of certain bacteria. This is the most common vaginal infection in women of childbearing age. Treatment is important to prevent complications, especially in pregnant women, as it can cause a premature delivery. CAUSES  Bacterial vaginosis is caused by an increase in harmful bacteria that are normally present in smaller amounts in the vagina. Several different kinds of bacteria can cause bacterial vaginosis. However, the reason that the condition develops is not fully understood. RISK FACTORS Certain activities or behaviors can put you at an increased risk of developing bacterial vaginosis, including:  Having a new sex partner or multiple sex partners.  Douching.  Using an intrauterine device (IUD) for contraception. Women do not get bacterial vaginosis from toilet seats, bedding, swimming pools, or contact with objects around them. SIGNS AND SYMPTOMS  Some women with bacterial vaginosis have no signs or symptoms. Common  symptoms include:  Grey vaginal discharge.  A fishlike odor with discharge, especially after sexual intercourse.  Itching or burning of the vagina and vulva.  Burning or pain with urination. DIAGNOSIS  Your health care provider will take a medical history and examine the vagina for signs of bacterial vaginosis. A sample of vaginal fluid may be taken. Your health care provider will look at this sample under a microscope to check for bacteria and abnormal cells. A vaginal pH test may also be done.  TREATMENT  Bacterial vaginosis may be treated with antibiotic medicines. These may be given in the form of a pill or a vaginal cream. A second round of antibiotics may be prescribed if the condition comes back after treatment.  HOME CARE INSTRUCTIONS   Only take over-the-counter or prescription medicines as directed by your health care provider.  If antibiotic medicine was prescribed, take it as directed. Make sure you finish it even if you start to feel better.  Do not have sex until treatment is completed.  Tell all sexual partners that you have a vaginal infection. They should see their health care provider and be treated if they have problems, such as a mild rash or itching.  Practice safe sex by using condoms and only having one sex partner. SEEK MEDICAL CARE IF:   Your symptoms are not improving after 3 days of treatment.  You have increased discharge or pain.  You have a fever. MAKE SURE YOU:   Understand these instructions.  Will watch your condition.  Will get help right away if you are not doing well or get worse.  FOR MORE INFORMATION  Centers for Disease Control and Prevention, Division of STD Prevention: [employer] American Sexual Health Association (ASHA): www.ashastd.org  Document Released: 09/19/2005 Document Revised: 07/10/2013 Document Reviewed: 05/01/2013 [facility] Patient Information 2014 [place], [place].  Vaginal Bleeding During Pregnancy, Third  Trimester A small amount of bleeding (spotting) from the vagina is relatively common in pregnancy. Various things can cause bleeding or spotting in pregnancy. Sometimes the bleeding is normal and is not a problem. However, bleeding during the third trimester can also be a sign of something serious for the mother and the baby. Be sure to tell your health care provider about any vaginal bleeding right away.  Some possible causes of vaginal bleeding during the third trimester include:   The placenta may be partially or completely covering the opening to the cervix (placenta previa).   The placenta may have separated from the uterus (abruption of the placenta).   There may be an infection or growth on the cervix.   You may be starting labor, called discharging of the mucus plug.   The placenta may grow into the muscle layer of the uterus (placenta accreta).  HOME CARE INSTRUCTIONS  Watch your condition for any changes. The following actions may help to lessen any discomfort you are feeling:   Follow your health care provider's instructions for limiting your activity. If your health care provider orders bed rest, you may need to stay in bed and only get up to use the bathroom. However, your health care provider may allow you to continue light activity.  If needed, make plans for someone to help with your regular activities and responsibilities while you are on bed rest.  Keep track of the number of pads you use each day, how often you change pads, and how soaked (saturated) they are. Write this down.  Do not use tampons. Do not douche.  Do not have sexual intercourse or orgasms until approved by your health care provider.  Follow your health care provider's advice about lifting, driving, and physical activities.  If you pass any tissue from your vagina, save the tissue so you can show it to your health care provider.   Only take over-the-counter or prescription medicines as directed by  your health care provider.  Do not take aspirin because it can make you bleed.   Keep all follow-up appointments as directed by your health care provider. SEEK MEDICAL CARE IF:  You have any vaginal bleeding during any part of your pregnancy.  You have cramps or labor pains. SEEK IMMEDIATE MEDICAL CARE IF:   You have severe cramps or pain in your back or belly (abdomen).  You have a fever, not controlled by medicine.  You have chills.  You have a gush of fluid from the vagina.  You pass large clots or tissue from your vagina.  Your bleeding increases.  You feel lightheaded or weak.  You pass out.  You feel less movement or no movement of the baby.  MAKE SURE YOU:  Understand these instructions.  Will watch your condition.  Will get help right away if you are not doing well or get worse. Document Released: 12/10/2002 Document Revised: 07/10/2013 Document Reviewed: 05/27/2013 [facility] Patient Information 2014 [place], [place].

## 2013-11-04 LAB — CULTURE, BETA STREP (GROUP B ONLY): SPECIAL REQUESTS: NORMAL

## 2013-11-04 LAB — GC/CHLAMYDIA PROBE AMP
CT Probe RNA: NEGATIVE
GC PROBE AMP APTIMA: NEGATIVE

## 2013-11-05 ENCOUNTER — Telehealth: Payer: Self-pay | Admitting: Obstetrics and Gynecology

## 2013-11-05 LAB — RUBELLA SCREEN: RUBELLA: 3.29 {index} — AB (ref <0.90)

## 2013-11-05 NOTE — Telephone Encounter (Signed)
Pt notified of + GBS culture.

## 2013-11-23 ENCOUNTER — Encounter (HOSPITAL_COMMUNITY): Payer: Self-pay | Admitting: *Deleted

## 2013-11-23 ENCOUNTER — Inpatient Hospital Stay (HOSPITAL_COMMUNITY)
Admission: AD | Admit: 2013-11-23 | Discharge: 2013-11-23 | Disposition: A | Discharge status: Home / Self Care | Payer: Medicaid Other | Source: Ambulatory Visit | Attending: Obstetrics & Gynecology | Admitting: Obstetrics & Gynecology

## 2013-11-23 DIAGNOSIS — O99330 Smoking (tobacco) complicating pregnancy, unspecified trimester: Secondary | ICD-10-CM | POA: Insufficient documentation

## 2013-11-23 DIAGNOSIS — O4700 False labor before 37 completed weeks of gestation, unspecified trimester: Secondary | ICD-10-CM | POA: Insufficient documentation

## 2013-11-23 DIAGNOSIS — R109 Unspecified abdominal pain: Secondary | ICD-10-CM | POA: Insufficient documentation

## 2013-11-23 DIAGNOSIS — Z22330 Carrier of Group B streptococcus: Secondary | ICD-10-CM | POA: Insufficient documentation

## 2013-11-23 DIAGNOSIS — O9989 Other specified diseases and conditions complicating pregnancy, childbirth and the puerperium: Secondary | ICD-10-CM

## 2013-11-23 DIAGNOSIS — O99891 Other specified diseases and conditions complicating pregnancy: Secondary | ICD-10-CM | POA: Insufficient documentation

## 2013-11-23 DIAGNOSIS — K5900 Constipation, unspecified: Secondary | ICD-10-CM | POA: Insufficient documentation

## 2013-11-23 DIAGNOSIS — O0930 Supervision of pregnancy with insufficient antenatal care, unspecified trimester: Secondary | ICD-10-CM | POA: Insufficient documentation

## 2013-11-23 DIAGNOSIS — O479 False labor, unspecified: Secondary | ICD-10-CM

## 2013-11-23 LAB — URINALYSIS, ROUTINE W REFLEX MICROSCOPIC
BILIRUBIN URINE: NEGATIVE
Glucose, UA: NEGATIVE mg/dL
HGB URINE DIPSTICK: NEGATIVE
KETONES UR: NEGATIVE mg/dL
LEUKOCYTES UA: NEGATIVE
NITRITE: NEGATIVE
PROTEIN: NEGATIVE mg/dL
SPECIFIC GRAVITY, URINE: 1.015 (ref 1.005–1.030)
Urobilinogen, UA: 0.2 mg/dL (ref 0.0–1.0)
pH: 7 (ref 5.0–8.0)

## 2013-11-23 NOTE — Progress Notes (Signed)
Written and verbal d/c instructions given and understanding voiced. 

## 2013-11-23 NOTE — MAU Note (Signed)
Contractions started about 0100. Leaked some clear fld on way to hosp. Has not had PNC.

## 2013-11-23 NOTE — Discharge Instructions (Signed)

## 2013-11-23 NOTE — Progress Notes (Signed)
Philipp DeputyKim Shaw CNM notified of pt's repeat SVE being unchanged and FHR tracing reactive. Will see pt

## 2013-11-23 NOTE — Progress Notes (Signed)
Up to Br 

## 2013-11-23 NOTE — Progress Notes (Signed)
Philipp DeputyKim Shaw CNM notified of pt's admission and status. Aware of no PNC, ctx pattern, leaking fld x 1 on way to hosp. RN to check cervix and will watch an hour and reck.

## 2013-11-23 NOTE — MAU Provider Note (Signed)
History     CSN: [id number]  Arrival date and time: 11/23/13 0149   None     Chief Complaint  Patient presents with  . Contractions  . Rupture of Membranes   HPI  [name] is a 23 y.o. G2P1001 at 5664w1d who presented to the MAU with onset of abdominal pain that felt like contractions. She states the contractions began at mignight, each lasted approximately 90 sec, and happened every 30 sec. She denies vaginal bleeding and reports good fetal movement. She reports that she felt a brief LOF but did not put on a pad and hasn't felt fluid or leaking since. She has not received prenatal care for this pregnancy, but does have an appt in [facility] for Monday, 11/25/13. She states that she has had constipation, previous vaginal bleeding, a fall, pelvic pain, and palpitations during this pregnancy. She has not taken any prenatal vitamins. She is GBS positive.  SH: [place] She does not work. She lives at home with her sister and 1 yr daughter. She reports smoking 1/2 ppd cigarettes, but denies alcohol and drug use. Recent UDS on 11/02/13 was negative.  Allergies: flagyl (N/V, rash on arms)  OB History   Grav Para Term Preterm Abortions TAB SAB Ect Mult Living   2 1 1  0 0 0 0 0 0 1      Past Medical History  Diagnosis Date  . Chlamydia 10-2011  . Infection   . Headache(784.0)   . Heart palpitations     Past Surgical History  Procedure Laterality Date  . No past surgeries      Family History  Problem Relation Age of Onset  . Anesthesia problems Neg Hx   . Other Neg Hx   . Diabetes Maternal Grandmother     History  Substance Use Topics  . Smoking status: Light Tobacco Smoker -- 0.25 packs/day for 3 years    Types: Cigarettes  . Smokeless tobacco: Never Used  . Alcohol Use: No    Allergies:  Allergies  Allergen Reactions  . Metronidazole Nausea And Vomiting    Prescriptions prior to admission  Medication Sig Dispense Refill  . clindamycin  (CLEOCIN) 300 MG capsule Take 1 capsule (300 mg total) by mouth 2 (two) times daily. X 7days  14 capsule  0  . prenatal vitamin w/FE, FA (PRENATAL 1 + 1) 27-1 MG TABS tablet Take 1 tablet by mouth daily at 12 noon.  30 each  12    Review of Systems  Constitutional: Negative for fever and chills.  Respiratory: Negative for cough and shortness of breath.   Cardiovascular: Negative for chest pain and palpitations.  Gastrointestinal: Positive for abdominal pain and constipation. Negative for heartburn, nausea, vomiting and diarrhea.  Genitourinary: Negative for dysuria and urgency.  Neurological: Negative for headaches.   Physical Exam   Blood pressure 133/82, pulse 90, temperature 98 F (36.7 C), resp. rate 22, height 5\' 6"  (1.676 m), not currently breastfeeding.  Physical Exam  Constitutional: She is oriented to person, place, and time. She appears well-developed and well-nourished.  Eyes: EOM are normal.  Cardiovascular: Normal rate and regular rhythm.   Respiratory: Effort normal and breath sounds normal.  GI: Soft. Bowel sounds are normal. There is tenderness (tenderness with palpation in lower quadrants). There is no rebound and no guarding.  Musculoskeletal: She exhibits no edema.  Neurological: She is alert and oriented to person, place, and time.  Skin: Skin is warm  and dry.  Psychiatric: She has a normal mood and affect.    Results for orders placed during the hospital encounter of 11/23/13 (from the past 24 hour(s))  URINALYSIS, ROUTINE W REFLEX MICROSCOPIC     Status: None   Collection Time    11/23/13  3:25 AM      Result Value Ref Range   Color, Urine YELLOW  YELLOW   APPearance CLEAR  CLEAR   Specific Gravity, Urine 1.015  1.005 - 1.030   pH 7.0  5.0 - 8.0   Glucose, UA NEGATIVE  NEGATIVE mg/dL   Hgb urine dipstick NEGATIVE  NEGATIVE   Bilirubin Urine NEGATIVE  NEGATIVE   Ketones, ur NEGATIVE  NEGATIVE mg/dL   Protein, ur NEGATIVE  NEGATIVE mg/dL   Urobilinogen,  UA 0.2  0.0 - 1.0 mg/dL   Nitrite NEGATIVE  NEGATIVE   Leukocytes, UA NEGATIVE  NEGATIVE   Dilation: Closed Effacement (%): 30 Cervical Position: Posterior Station: -2 Exam by:: Quintella Baton RNC  EFM: baseline 130s, +accels, no decels Ctx initially irreg q 2-4 mins, then almost gone by the end of her time in  MAU  MAU Course  Procedures  MDM UA Cervical exam  Assessment and Plan  Assessment: 1. Pregnancy  Plan: [name] [name] is a 23 y.o. G2P1001 at [redacted]w[redacted]d who has not received prenatal care for this pregnancy presents with pain consistent with Braxton-Hicks contractions. She was observed in the MAU. No contractions were observed on the monitor and her cervix has not changed. Discussed with patient using tylenol and warm baths for pain control. Advised to keep appt on Monday in [facility].   Duane Boston 11/23/2013, 4:28 AM   I have seen and examined this patient and I agree with the above. Cam Hai 7:54 AM 11/23/2013

## 2013-11-25 ENCOUNTER — Ambulatory Visit (INDEPENDENT_AMBULATORY_CARE_PROVIDER_SITE_OTHER): Payer: Medicaid Other | Admitting: Family Medicine

## 2013-11-25 ENCOUNTER — Encounter: Payer: Self-pay | Admitting: Family Medicine

## 2013-11-25 VITALS — BP 135/75 | Temp 97.4°F | Wt 179.9 lb

## 2013-11-25 DIAGNOSIS — O0930 Supervision of pregnancy with insufficient antenatal care, unspecified trimester: Secondary | ICD-10-CM

## 2013-11-25 DIAGNOSIS — N926 Irregular menstruation, unspecified: Secondary | ICD-10-CM

## 2013-11-25 DIAGNOSIS — O0933 Supervision of pregnancy with insufficient antenatal care, third trimester: Secondary | ICD-10-CM

## 2013-11-25 DIAGNOSIS — Z23 Encounter for immunization: Secondary | ICD-10-CM

## 2013-11-25 LAB — CBC
HCT: 32.8 % — ABNORMAL LOW (ref 36.0–46.0)
HEMOGLOBIN: 10.4 g/dL — AB (ref 12.0–15.0)
MCH: 22.1 pg — ABNORMAL LOW (ref 26.0–34.0)
MCHC: 31.7 g/dL (ref 30.0–36.0)
MCV: 69.6 fL — ABNORMAL LOW (ref 78.0–100.0)
PLATELETS: 338 10*3/uL (ref 150–400)
RBC: 4.71 MIL/uL (ref 3.87–5.11)
RDW: 17.4 % — ABNORMAL HIGH (ref 11.5–15.5)
WBC: 9.8 10*3/uL (ref 4.0–10.5)

## 2013-11-25 LAB — GLUCOSE TOLERANCE, 1 HOUR (50G) W/O FASTING: GLUCOSE 1 HOUR GTT: 78 mg/dL (ref 70–140)

## 2013-11-25 LAB — OB RESULTS CONSOLE GC/CHLAMYDIA
CHLAMYDIA, DNA PROBE: NEGATIVE
GC PROBE AMP, GENITAL: NEGATIVE

## 2013-11-25 LAB — POCT URINALYSIS DIP (DEVICE)
Bilirubin Urine: NEGATIVE
GLUCOSE, UA: NEGATIVE mg/dL
Hgb urine dipstick: NEGATIVE
Ketones, ur: NEGATIVE mg/dL
Nitrite: NEGATIVE
Protein, ur: NEGATIVE mg/dL
Specific Gravity, Urine: 1.02 (ref 1.005–1.030)
Urobilinogen, UA: 0.2 mg/dL (ref 0.0–1.0)
pH: 7 (ref 5.0–8.0)

## 2013-11-25 MED ORDER — TETANUS-DIPHTH-ACELL PERTUSSIS 5-2.5-18.5 LF-MCG/0.5 IM SUSP: 0.5000 mL | Freq: Once | INTRAMUSCULAR | Status: DC

## 2013-11-25 NOTE — Progress Notes (Signed)
U/S scheduled 12/03/13 at 245 pm.

## 2013-11-25 NOTE — Progress Notes (Signed)
Nutriiton note: 1st visit consult Pt has gained 34.9# @ 5115w3d, which is > expected. Pt reports eating 3 meals & 4 snacks/d. Pt is not taking a PNV. Pt reports no N/V but has some heartburn. NKFA. Pt reports walking most days. Pt received verbal & written education on general nutrition during pregnancy. Discussed tips to decrease heartburn. Encouraged PNV. Discussed wt gain goals of 25-35# or 1#/wk. Pt agrees to start taking a PNV. Pt has W[facility] & plans to BF. F/u if referred Blondell RevealLaura Kaori Jumper, MS, RD, LDN, [facility]

## 2013-11-25 NOTE — Progress Notes (Signed)
   Subjective:    [name] [name] is a G2P1001 4672w3d being seen today for her first obstetrical visit.  Her obstetrical history is significant for late prenatal care.  Pregnancy history fully reviewed.  Patient reports backache.  Filed Vitals:   11/25/13 0949  BP: 135/75  Temp: 97.4 F (36.3 C)  Weight: 179 lb 14.4 oz (81.602 kg)    HISTORY: OB History  Gravida Para Term Preterm AB SAB TAB Ectopic Multiple Living  2 1 1  0 0 0 0 0 0 1    # Outcome Date GA Lbr Len/2nd Weight Sex Delivery Anes PTL Lv  2 CUR           1 TRM 07/03/12 3758w1d 33:31 / 00:09 5 lb 7.1 oz (2.469 kg) F SVD EPI  Y     Past Medical History  Diagnosis Date  . Chlamydia 10-2011  . Infection   . Headache(784.0)   . Heart palpitations    Past Surgical History  Procedure Laterality Date  . No past surgeries     Family History  Problem Relation Age of Onset  . Anesthesia problems Neg Hx   . Other Neg Hx   . Diabetes Maternal Grandmother      Exam    Uterus:     Pelvic Exam:    Perineum: Normal Perineum   Vulva: Bartholin's, Urethra, Skene's normal   Vagina:  normal mucosa, normal discharge   Cervix: multiparous appearance, no cervical motion tenderness and no lesions   Adnexa: normal adnexa   Bony Pelvis: average  System: Breast:  normal appearance, no masses or tenderness   Skin: normal coloration and turgor, no rashes    Neurologic: oriented   Extremities: normal strength, tone, and muscle mass   HEENT sclera clear, anicteric   Mouth/Teeth mucous membranes moist, pharynx normal without lesions   Neck supple   Cardiovascular: regular rate and rhythm, no murmurs or gallops   Respiratory:  appears well, vitals normal, no respiratory distress, acyanotic, normal RR, ear and throat exam is normal, neck free of mass or lymphadenopathy, chest clear, no wheezing, crepitations, rhonchi, normal symmetric air entry   Abdomen: soft, non-tender; bowel sounds normal; no masses,  no organomegaly       Assessment:    Pregnancy: G2P1001 Patient Active Problem List   Diagnosis Date Noted  . Late prenatal care complicating pregnancy in third trimester 11/25/2013        Plan:     Initial labs reviewed. Prenatal vitamins. Problem list reviewed and updated. Genetic Screening discussed Quad Screen: too late.  Ultrasound discussed; fetal survey: ordered.  Follow up in 1 weeks.   PRATT,TANYA S 11/25/2013

## 2013-11-25 NOTE — Progress Notes (Signed)
p-92 

## 2013-11-25 NOTE — Patient Instructions (Signed)
Third Trimester of Pregnancy The third trimester is from week 29 through week 42, months 7 through 9. The third trimester is a time when the fetus is growing rapidly. At the end of the ninth month, the fetus is about 20 inches in length and weighs 6 10 pounds.  BODY CHANGES Your body goes through many changes during pregnancy. The changes vary from woman to woman.   Your weight will continue to increase. You can expect to gain 25 35 pounds (11 16 kg) by the end of the pregnancy.  You may begin to get stretch marks on your hips, abdomen, and breasts.  You may urinate more often because the fetus is moving lower into your pelvis and pressing on your bladder.  You may develop or continue to have heartburn as a result of your pregnancy.  You may develop constipation because certain hormones are causing the muscles that push waste through your intestines to slow down.  You may develop hemorrhoids or swollen, bulging veins (varicose veins).  You may have pelvic pain because of the weight gain and pregnancy hormones relaxing your joints between the bones in your pelvis. Back aches may result from over exertion of the muscles supporting your posture.  Your breasts will continue to grow and be tender. A yellow discharge may leak from your breasts called colostrum.  Your belly button may stick out.  You may feel short of breath because of your expanding uterus.  You may notice the fetus "dropping," or moving lower in your abdomen.  You may have a bloody mucus discharge. This usually occurs a few days to a week before labor begins.  Your cervix becomes thin and soft (effaced) near your due date. WHAT TO EXPECT AT YOUR PRENATAL EXAMS  You will have prenatal exams every 2 weeks until week 36. Then, you will have weekly prenatal exams. During a routine prenatal visit:  You will be weighed to make sure you and the fetus are growing normally.  Your blood pressure is taken.  Your abdomen will  be measured to track your baby's growth.  The fetal heartbeat will be listened to.  Any test results from the previous visit will be discussed.  You may have a cervical check near your due date to see if you have effaced. At around 36 weeks, your caregiver will check your cervix. At the same time, your caregiver will also perform a test on the secretions of the vaginal tissue. This test is to determine if a type of bacteria, Group B streptococcus, is present. Your caregiver will explain this further. Your caregiver may ask you:  What your birth plan is.  How you are feeling.  If you are feeling the baby move.  If you have had any abnormal symptoms, such as leaking fluid, bleeding, severe headaches, or abdominal cramping.  If you have any questions. Other tests or screenings that may be performed during your third trimester include:  Blood tests that check for low iron levels (anemia).  Fetal testing to check the health, activity level, and growth of the fetus. Testing is done if you have certain medical conditions or if there are problems during the pregnancy. FALSE LABOR You may feel small, irregular contractions that eventually go away. These are called Braxton Hicks contractions, or false labor. Contractions may last for hours, days, or even weeks before true labor sets in. If contractions come at regular intervals, intensify, or become painful, it is best to be seen by your caregiver.    SIGNS OF LABOR   Menstrual-like cramps.  Contractions that are 5 minutes apart or less.  Contractions that start on the top of the uterus and spread down to the lower abdomen and back.  A sense of increased pelvic pressure or back pain.  A watery or bloody mucus discharge that comes from the vagina. If you have any of these signs before the 37th week of pregnancy, call your caregiver right away. You need to go to the hospital to get checked immediately. HOME CARE INSTRUCTIONS   Avoid all  smoking, herbs, alcohol, and unprescribed drugs. These chemicals affect the formation and growth of the baby.  Follow your caregiver's instructions regarding medicine use. There are medicines that are either safe or unsafe to take during pregnancy.  Exercise only as directed by your caregiver. Experiencing uterine cramps is a good sign to stop exercising.  Continue to eat regular, healthy meals.  Wear a good support bra for breast tenderness.  Do not use hot tubs, steam rooms, or saunas.  Wear your seat belt at all times when driving.  Avoid raw meat, uncooked cheese, cat litter boxes, and soil used by cats. These carry germs that can cause birth defects in the baby.  Take your prenatal vitamins.  Try taking a stool softener (if your caregiver approves) if you develop constipation. Eat more high-fiber foods, such as fresh vegetables or fruit and whole grains. Drink plenty of fluids to keep your urine clear or pale yellow.  Take warm sitz baths to soothe any pain or discomfort caused by hemorrhoids. Use hemorrhoid cream if your caregiver approves.  If you develop varicose veins, wear support hose. Elevate your feet for 15 minutes, 3 4 times a day. Limit salt in your diet.  Avoid heavy lifting, wear low heal shoes, and practice good posture.  Rest a lot with your legs elevated if you have leg cramps or low back pain.  Visit your dentist if you have not gone during your pregnancy. Use a soft toothbrush to brush your teeth and be gentle when you floss.  A sexual relationship may be continued unless your caregiver directs you otherwise.  Do not travel far distances unless it is absolutely necessary and only with the approval of your caregiver.  Take prenatal classes to understand, practice, and ask questions about the labor and delivery.  Make a trial run to the hospital.  Pack your hospital bag.  Prepare the baby's nursery.  Continue to go to all your prenatal visits as directed  by your caregiver. SEEK MEDICAL CARE IF:  You are unsure if you are in labor or if your water has broken.  You have dizziness.  You have mild pelvic cramps, pelvic pressure, or nagging pain in your abdominal area.  You have persistent nausea, vomiting, or diarrhea.  You have a bad smelling vaginal discharge.  You have pain with urination. SEEK IMMEDIATE MEDICAL CARE IF:   You have a fever.  You are leaking fluid from your vagina.  You have spotting or bleeding from your vagina.  You have severe abdominal cramping or pain.  You have rapid weight loss or gain.  You have shortness of breath with chest pain.  You notice sudden or extreme swelling of your face, hands, ankles, feet, or legs.  You have not felt your baby move in over an hour.  You have severe headaches that do not go away with medicine.  You have vision changes. Document Released: 09/13/2001 Document Revised: 05/22/2013 Document Reviewed:   11/20/2012 [employer] Patient Information 2014 [employer].  Breastfeeding Deciding to breastfeed is one of the best choices you can make for you and your baby. A change in hormones during pregnancy causes your breast tissue to grow and increases the number and size of your milk ducts. These hormones also allow proteins, sugars, and fats from your blood supply to make breast milk in your milk-producing glands. Hormones prevent breast milk from being released before your baby is born as well as prompt milk flow after birth. Once breastfeeding has begun, thoughts of your baby, as well as his or her sucking or crying, can stimulate the release of milk from your milk-producing glands.  BENEFITS OF BREASTFEEDING For Your Baby  Your first milk (colostrum) helps your baby's digestive system function better.   There are antibodies in your milk that help your baby fight off infections.   Your baby has a lower incidence of asthma, allergies, and sudden infant death syndrome.    The nutrients in breast milk are better for your baby than infant formulas and are designed uniquely for your baby's needs.   Breast milk improves your baby's brain development.   Your baby is less likely to develop other conditions, such as childhood obesity, asthma, or type 2 diabetes mellitus.  For You   Breastfeeding helps to create a very special bond between you and your baby.   Breastfeeding is convenient. Breast milk is always available at the correct temperature and costs nothing.   Breastfeeding helps to burn calories and helps you lose the weight gained during pregnancy.   Breastfeeding makes your uterus contract to its prepregnancy size faster and slows bleeding (lochia) after you give birth.   Breastfeeding helps to lower your risk of developing type 2 diabetes mellitus, osteoporosis, and breast or ovarian cancer later in life. SIGNS THAT YOUR BABY IS HUNGRY Early Signs of Hunger  Increased alertness or activity.  Stretching.  Movement of the head from side to side.  Movement of the head and opening of the mouth when the corner of the mouth or cheek is stroked (rooting).  Increased sucking sounds, smacking lips, cooing, sighing, or squeaking.  Hand-to-mouth movements.  Increased sucking of fingers or hands. Late Signs of Hunger  Fussing.  Intermittent crying. Extreme Signs of Hunger Signs of extreme hunger will require calming and consoling before your baby will be able to breastfeed successfully. Do not wait for the following signs of extreme hunger to occur before you initiate breastfeeding:   Restlessness.  A loud, strong cry.   Screaming. BREASTFEEDING BASICS Breastfeeding Initiation  Find a comfortable place to sit or lie down, with your neck and back well supported.  Place a pillow or rolled up blanket under your baby to bring him or her to the level of your breast (if you are seated). Nursing pillows are specially designed to help  support your arms and your baby while you breastfeed.  Make sure that your baby's abdomen is facing your abdomen.   Gently massage your breast. With your fingertips, massage from your chest wall toward your nipple in a circular motion. This encourages milk flow. You may need to continue this action during the feeding if your milk flows slowly.  Support your breast with 4 fingers underneath and your thumb above your nipple. Make sure your fingers are well away from your nipple and your baby's mouth.   Stroke your baby's lips gently with your finger or nipple.   When your baby's mouth is   open wide enough, quickly bring your baby to your breast, placing your entire nipple and as much of the colored area around your nipple (areola) as possible into your baby's mouth.   More areola should be visible above your baby's upper lip than below the lower lip.   Your baby's tongue should be between his or her lower gum and your breast.   Ensure that your baby's mouth is correctly positioned around your nipple (latched). Your baby's lips should create a seal on your breast and be turned out (everted).  It is common for your baby to suck about 2 3 minutes in order to start the flow of breast milk. Latching Teaching your baby how to latch on to your breast properly is very important. An improper latch can cause nipple pain and decreased milk supply for you and poor weight gain in your baby. Also, if your baby is not latched onto your nipple properly, he or she may swallow some air during feeding. This can make your baby fussy. Burping your baby when you switch breasts during the feeding can help to get rid of the air. However, teaching your baby to latch on properly is still the best way to prevent fussiness from swallowing air while breastfeeding. Signs that your baby has successfully latched on to your nipple:    Silent tugging or silent sucking, without causing you pain.   Swallowing heard  between every 3 4 sucks.    Muscle movement above and in front of his or her ears while sucking.  Signs that your baby has not successfully latched on to nipple:   Sucking sounds or smacking sounds from your baby while breastfeeding.  Nipple pain. If you think your baby has not latched on correctly, slip your finger into the corner of your baby's mouth to break the suction and place it between your baby's gums. Attempt breastfeeding initiation again. Signs of Successful Breastfeeding Signs from your baby:   A gradual decrease in the number of sucks or complete cessation of sucking.   Falling asleep.   Relaxation of his or her body.   Retention of a small amount of milk in his or her mouth.   Letting go of your breast by himself or herself. Signs from you:  Breasts that have increased in firmness, weight, and size 1 3 hours after feeding.   Breasts that are softer immediately after breastfeeding.  Increased milk volume, as well as a change in milk consistency and color by the 5th day of breastfeeding.   Nipples that are not sore, cracked, or bleeding. Signs That Your Baby is Getting Enough Milk  Wetting at least 3 diapers in a 24-hour period. The urine should be clear and pale yellow by age 5 days.  At least 3 stools in a 24-hour period by age 5 days. The stool should be soft and yellow.  At least 3 stools in a 24-hour period by age 7 days. The stool should be seedy and yellow.  No loss of weight greater than 10% of birth weight during the first 3 days of age.  Average weight gain of 4 7 ounces (120 210 mL) per week after age 4 days.  Consistent daily weight gain by age 5 days, without weight loss after the age of 2 weeks. After a feeding, your baby may spit up a small amount. This is common. BREASTFEEDING FREQUENCY AND DURATION Frequent feeding will help you make more milk and can prevent sore nipples and breast engorgement.   Breastfeed when you feel the need to  reduce the fullness of your breasts or when your baby shows signs of hunger. This is called "breastfeeding on demand." Avoid introducing a pacifier to your baby while you are working to establish breastfeeding (the first 4 6 weeks after your baby is born). After this time you may choose to use a pacifier. Research has shown that pacifier use during the first year of a baby's life decreases the risk of sudden infant death syndrome (SIDS). Allow your baby to feed on each breast as long as he or she wants. Breastfeed until your baby is finished feeding. When your baby unlatches or falls asleep while feeding from the first breast, offer the second breast. Because newborns are often sleepy in the first few weeks of life, you may need to awaken your baby to get him or her to feed. Breastfeeding times will vary from baby to baby. However, the following rules can serve as a guide to help you ensure that your baby is properly fed:  Newborns (babies 4 weeks of age or younger) may breastfeed every 1 3 hours.  Newborns should not go longer than 3 hours during the day or 5 hours during the night without breastfeeding.  You should breastfeed your baby a minimum of 8 times in a 24-hour period until you begin to introduce solid foods to your baby at around 6 months of age. BREAST MILK PUMPING Pumping and storing breast milk allows you to ensure that your baby is exclusively fed your breast milk, even at times when you are unable to breastfeed. This is especially important if you are going back to work while you are still breastfeeding or when you are not able to be present during feedings. Your lactation consultant can give you guidelines on how long it is safe to store breast milk.  A breast pump is a machine that allows you to pump milk from your breast into a sterile bottle. The pumped breast milk can then be stored in a refrigerator or freezer. Some breast pumps are operated by hand, while others use electricity. Ask  your lactation consultant which type will work best for you. Breast pumps can be purchased, but some hospitals and breastfeeding support groups lease breast pumps on a monthly basis. A lactation consultant can teach you how to hand express breast milk, if you prefer not to use a pump.  CARING FOR YOUR BREASTS WHILE YOU BREASTFEED Nipples can become dry, cracked, and sore while breastfeeding. The following recommendations can help keep your breasts moisturized and healthy:  Avoid using soap on your nipples.   Wear a supportive bra. Although not required, special nursing bras and tank tops are designed to allow access to your breasts for breastfeeding without taking off your entire bra or top. Avoid wearing underwire style bras or extremely tight bras.  Air dry your nipples for 3 4minutes after each feeding.   Use only cotton bra pads to absorb leaked breast milk. Leaking of breast milk between feedings is normal.   Use lanolin on your nipples after breastfeeding. Lanolin helps to maintain your skin's normal moisture barrier. If you use pure lanolin you do not need to wash it off before feeding your baby again. Pure lanolin is not toxic to your baby. You may also hand express a few drops of breast milk and gently massage that milk into your nipples and allow the milk to air dry. In the first few weeks after giving birth, some women   experience extremely full breasts (engorgement). Engorgement can make your breasts feel heavy, warm, and tender to the touch. Engorgement peaks within 3 5 days after you give birth. The following recommendations can help ease engorgement:  Completely empty your breasts while breastfeeding or pumping. You may want to start by applying warm, moist heat (in the shower or with warm water-soaked hand towels) just before feeding or pumping. This increases circulation and helps the milk flow. If your baby does not completely empty your breasts while breastfeeding, pump any extra  milk after he or she is finished.  Wear a snug bra (nursing or regular) or tank top for 1 2 days to signal your body to slightly decrease milk production.  Apply ice packs to your breasts, unless this is too uncomfortable for you.  Make sure that your baby is latched on and positioned properly while breastfeeding. If engorgement persists after 48 hours of following these recommendations, contact your health care provider or a lactation consultant. OVERALL HEALTH CARE RECOMMENDATIONS WHILE BREASTFEEDING  Eat healthy foods. Alternate between meals and snacks, eating 3 of each per day. Because what you eat affects your breast milk, some of the foods may make your baby more irritable than usual. Avoid eating these foods if you are sure that they are negatively affecting your baby.  Drink milk, fruit juice, and water to satisfy your thirst (about 10 glasses a day).   Rest often, relax, and continue to take your prenatal vitamins to prevent fatigue, stress, and anemia.  Continue breast self-awareness checks.  Avoid chewing and smoking tobacco.  Avoid alcohol and drug use. Some medicines that may be harmful to your baby can pass through breast milk. It is important to ask your health care provider before taking any medicine, including all over-the-counter and prescription medicine as well as vitamin and herbal supplements. It is possible to become pregnant while breastfeeding. If birth control is desired, ask your health care provider about options that will be safe for your baby. SEEK MEDICAL CARE IF:   You feel like you want to stop breastfeeding or have become frustrated with breastfeeding.  You have painful breasts or nipples.  Your nipples are cracked or bleeding.  Your breasts are red, tender, or warm.  You have a swollen area on either breast.  You have a fever or chills.  You have nausea or vomiting.  You have drainage other than breast milk from your nipples.  Your breasts  do not become full before feedings by the 5th day after you give birth.  You feel sad and depressed.  Your baby is too sleepy to eat well.  Your baby is having trouble sleeping.   Your baby is wetting less than 3 diapers in a 24-hour period.  Your baby has less than 3 stools in a 24-hour period.  Your baby's skin or the white part of his or her eyes becomes yellow.   Your baby is not gaining weight by 5 days of age. SEEK IMMEDIATE MEDICAL CARE IF:   Your baby is overly tired (lethargic) and does not want to wake up and feed.  Your baby develops an unexplained fever. Document Released: 09/19/2005 Document Revised: 05/22/2013 Document Reviewed: 03/13/2013 [employer] Patient Information 2014 [employer].  

## 2013-11-26 LAB — GC/CHLAMYDIA PROBE AMP
CT PROBE, AMP APTIMA: NEGATIVE
GC PROBE AMP APTIMA: NEGATIVE

## 2013-11-26 LAB — PRESCRIPTION MONITORING PROFILE (19 PANEL)
AMPHETAMINE/METH: NEGATIVE ng/mL
BENZODIAZEPINE SCREEN, URINE: NEGATIVE ng/mL
Barbiturate Screen, Urine: NEGATIVE ng/mL
Buprenorphine, Urine: NEGATIVE ng/mL
CANNABINOID SCRN UR: NEGATIVE ng/mL
COCAINE METABOLITES: NEGATIVE ng/mL
Carisoprodol, Urine: NEGATIVE ng/mL
Creatinine, Urine: 165.17 mg/dL (ref >20.0)
FENTANYL URINE: NEGATIVE ng/mL
MDMA URINE: NEGATIVE ng/mL
Meperidine, Ur: NEGATIVE ng/mL
Methadone Screen, Urine: NEGATIVE ng/mL
Methaqualone: NEGATIVE ng/mL
Nitrites, Initial: NEGATIVE ug/mL
Opiate Screen, Urine: NEGATIVE ng/mL
Oxycodone Screen, Ur: NEGATIVE ng/mL
PHENCYCLIDINE, UR: NEGATIVE ng/mL
Propoxyphene: NEGATIVE ng/mL
Tapentadol, urine: NEGATIVE ng/mL
Tramadol Scrn, Ur: NEGATIVE ng/mL
ZOLPIDEM, URINE: NEGATIVE ng/mL
pH, Initial: 7.6 pH (ref 4.5–8.9)

## 2013-11-27 LAB — CULTURE, OB URINE: Colony Count: 90000

## 2013-12-03 ENCOUNTER — Ambulatory Visit (HOSPITAL_COMMUNITY)
Admission: RE | Admit: 2013-12-03 | Discharge: 2013-12-03 | Disposition: A | Discharge status: Home / Self Care | Payer: Medicaid Other | Source: Ambulatory Visit | Attending: Family Medicine | Admitting: Family Medicine

## 2013-12-03 ENCOUNTER — Other Ambulatory Visit: Payer: Self-pay | Admitting: Family Medicine

## 2013-12-03 DIAGNOSIS — O0930 Supervision of pregnancy with insufficient antenatal care, unspecified trimester: Secondary | ICD-10-CM | POA: Insufficient documentation

## 2013-12-03 DIAGNOSIS — O0933 Supervision of pregnancy with insufficient antenatal care, third trimester: Secondary | ICD-10-CM

## 2013-12-03 DIAGNOSIS — Z23 Encounter for immunization: Secondary | ICD-10-CM

## 2013-12-03 DIAGNOSIS — Z3689 Encounter for other specified antenatal screening: Secondary | ICD-10-CM | POA: Insufficient documentation

## 2013-12-03 DIAGNOSIS — N926 Irregular menstruation, unspecified: Secondary | ICD-10-CM

## 2013-12-04 ENCOUNTER — Ambulatory Visit (INDEPENDENT_AMBULATORY_CARE_PROVIDER_SITE_OTHER): Payer: Medicaid Other | Admitting: Obstetrics & Gynecology

## 2013-12-04 ENCOUNTER — Encounter: Payer: Self-pay | Admitting: Family Medicine

## 2013-12-04 VITALS — BP 136/81 | Temp 97.4°F | Wt 179.5 lb

## 2013-12-04 DIAGNOSIS — Z3480 Encounter for supervision of other normal pregnancy, unspecified trimester: Secondary | ICD-10-CM

## 2013-12-04 DIAGNOSIS — O0930 Supervision of pregnancy with insufficient antenatal care, unspecified trimester: Secondary | ICD-10-CM

## 2013-12-04 LAB — POCT URINALYSIS DIP (DEVICE)
Bilirubin Urine: NEGATIVE
GLUCOSE, UA: NEGATIVE mg/dL
HGB URINE DIPSTICK: NEGATIVE
Ketones, ur: NEGATIVE mg/dL
Nitrite: NEGATIVE
PROTEIN: 30 mg/dL — AB
SPECIFIC GRAVITY, URINE: 1.025 (ref 1.005–1.030)
UROBILINOGEN UA: 1 mg/dL (ref 0.0–1.0)
pH: 6.5 (ref 5.0–8.0)

## 2013-12-04 LAB — OB RESULTS CONSOLE GBS: STREP GROUP B AG: POSITIVE

## 2013-12-04 NOTE — Progress Notes (Signed)
Pulse- 95 Patient reports pelvic pain/pressure as well as in between her thighs; reports getting 15 contractions a day

## 2013-12-04 NOTE — Progress Notes (Signed)
Routine visit. Good FM. Labor precautions reviewed. GBS sent. No problems.

## 2013-12-05 LAB — CULTURE, BETA STREP (GROUP B ONLY)

## 2013-12-11 ENCOUNTER — Encounter: Payer: Self-pay | Admitting: Obstetrics and Gynecology

## 2013-12-11 ENCOUNTER — Ambulatory Visit (INDEPENDENT_AMBULATORY_CARE_PROVIDER_SITE_OTHER): Payer: Medicaid Other | Admitting: Obstetrics and Gynecology

## 2013-12-11 VITALS — BP 141/91 | Temp 98.4°F | Wt 181.5 lb

## 2013-12-11 DIAGNOSIS — O0933 Supervision of pregnancy with insufficient antenatal care, third trimester: Secondary | ICD-10-CM

## 2013-12-11 DIAGNOSIS — O0930 Supervision of pregnancy with insufficient antenatal care, unspecified trimester: Secondary | ICD-10-CM

## 2013-12-11 LAB — POCT URINALYSIS DIP (DEVICE)
Bilirubin Urine: NEGATIVE
GLUCOSE, UA: NEGATIVE mg/dL
HGB URINE DIPSTICK: NEGATIVE
Nitrite: NEGATIVE
Protein, ur: 30 mg/dL — AB
Specific Gravity, Urine: 1.025 (ref 1.005–1.030)
Urobilinogen, UA: 0.2 mg/dL (ref 0.0–1.0)
pH: 7 (ref 5.0–8.0)
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
## 2013-12-11 NOTE — Progress Notes (Signed)
Patient is doing well without complaints. FM/labor precautions reviewed 

## 2013-12-12 ENCOUNTER — Encounter: Payer: Medicaid Other | Admitting: Advanced Practice Midwife

## 2013-12-12 ENCOUNTER — Telehealth: Payer: Self-pay

## 2013-12-12 NOTE — Telephone Encounter (Signed)
Pt. Called stating she was here for an appointment yesterday was 2cm dilated and they swept her membranes. Pt. States she woke up this morning with blood tinged discharge and would like to know if this is normal. Called pt. And informed her that this is normal. Pt. Denies bleeding since and states contractions here and there but nothing regular. States that she just feels a lot of pressure. Informed pt. That all of this is normal. Advised her to come to [facility] if she has gush of blood, fluid, or if her contractions become regular, closer together 4-665min apart-- informed her these are all signs of labor. Pt. Verbalized understanding and had no other questions or concerns.

## 2013-12-14 ENCOUNTER — Encounter (HOSPITAL_COMMUNITY): Payer: Self-pay

## 2013-12-14 ENCOUNTER — Inpatient Hospital Stay (HOSPITAL_COMMUNITY)
Admission: AD | Admit: 2013-12-14 | Discharge: 2013-12-16 | DRG: 775 | Disposition: A | Discharge status: Home / Self Care | Payer: Medicaid Other | Source: Ambulatory Visit | Attending: Obstetrics & Gynecology | Admitting: Obstetrics & Gynecology

## 2013-12-14 ENCOUNTER — Inpatient Hospital Stay (HOSPITAL_COMMUNITY): Payer: Medicaid Other | Admitting: Anesthesiology

## 2013-12-14 ENCOUNTER — Encounter (HOSPITAL_COMMUNITY): Payer: Medicaid Other | Admitting: Anesthesiology

## 2013-12-14 DIAGNOSIS — Z3490 Encounter for supervision of normal pregnancy, unspecified, unspecified trimester: Secondary | ICD-10-CM

## 2013-12-14 DIAGNOSIS — O139 Gestational [pregnancy-induced] hypertension without significant proteinuria, unspecified trimester: Secondary | ICD-10-CM | POA: Diagnosis present

## 2013-12-14 DIAGNOSIS — Z22330 Carrier of Group B streptococcus: Secondary | ICD-10-CM

## 2013-12-14 DIAGNOSIS — O99892 Other specified diseases and conditions complicating childbirth: Secondary | ICD-10-CM

## 2013-12-14 DIAGNOSIS — O9989 Other specified diseases and conditions complicating pregnancy, childbirth and the puerperium: Secondary | ICD-10-CM

## 2013-12-14 DIAGNOSIS — O0933 Supervision of pregnancy with insufficient antenatal care, third trimester: Secondary | ICD-10-CM

## 2013-12-14 DIAGNOSIS — O328XX0 Maternal care for other malpresentation of fetus, not applicable or unspecified: Secondary | ICD-10-CM

## 2013-12-14 DIAGNOSIS — O99334 Smoking (tobacco) complicating childbirth: Secondary | ICD-10-CM | POA: Diagnosis present

## 2013-12-14 DIAGNOSIS — O0930 Supervision of pregnancy with insufficient antenatal care, unspecified trimester: Secondary | ICD-10-CM

## 2013-12-14 LAB — CBC
HCT: 32.1 % — ABNORMAL LOW (ref 36.0–46.0)
Hemoglobin: 10.4 g/dL — ABNORMAL LOW (ref 12.0–15.0)
MCH: 22.4 pg — ABNORMAL LOW (ref 26.0–34.0)
MCHC: 32.4 g/dL (ref 30.0–36.0)
MCV: 69 fL — ABNORMAL LOW (ref 78.0–100.0)
PLATELETS: 338 10*3/uL (ref 150–400)
RBC: 4.65 MIL/uL (ref 3.87–5.11)
RDW: 16.2 % — AB (ref 11.5–15.5)
WBC: 11.3 10*3/uL — AB (ref 4.0–10.5)

## 2013-12-14 LAB — COMPREHENSIVE METABOLIC PANEL
ALBUMIN: 2.5 g/dL — AB (ref 3.5–5.2)
ALT: 12 U/L (ref 0–35)
AST: 14 U/L (ref 0–37)
Alkaline Phosphatase: 108 U/L (ref 39–117)
BUN: 3 mg/dL — ABNORMAL LOW (ref 6–23)
CO2: 21 mEq/L (ref 19–32)
CREATININE: 0.49 mg/dL — AB (ref 0.50–1.10)
Calcium: 8.9 mg/dL (ref 8.4–10.5)
Chloride: 104 mEq/L (ref 96–112)
GFR calc Af Amer: >90 mL/min (ref >90)
GFR calc non Af Amer: >90 mL/min (ref >90)
GLUCOSE: 75 mg/dL (ref 70–99)
Potassium: 4 mEq/L (ref 3.7–5.3)
Sodium: 139 mEq/L (ref 137–147)
Total Bilirubin: <0.2 mg/dL — ABNORMAL LOW (ref 0.3–1.2)
Total Protein: 6.8 g/dL (ref 6.0–8.3)

## 2013-12-14 LAB — RAPID URINE DRUG SCREEN, HOSP PERFORMED
Amphetamines: NOT DETECTED
BARBITURATES: NOT DETECTED
Benzodiazepines: NOT DETECTED
COCAINE: NOT DETECTED
OPIATES: NOT DETECTED
Tetrahydrocannabinol: NOT DETECTED

## 2013-12-14 LAB — PROTEIN / CREATININE RATIO, URINE
Creatinine, Urine: 105.32 mg/dL
Protein Creatinine Ratio: 0.17 — ABNORMAL HIGH (ref 0.00–0.15)
TOTAL PROTEIN, URINE: 18.4 mg/dL

## 2013-12-14 LAB — RPR: RPR: NONREACTIVE

## 2013-12-14 MED ORDER — SODIUM CHLORIDE 0.9 % IJ SOLN: 3.0000 mL | INTRAMUSCULAR | Status: DC | PRN

## 2013-12-14 MED ORDER — IBUPROFEN 600 MG PO TABS
600.0000 mg | ORAL_TABLET | Freq: Four times a day (QID) | ORAL | Status: DC
  Administered 2013-12-14 – 2013-12-15 (×4): 600 mg via ORAL
  Filled 2013-12-14 (×5): qty 1

## 2013-12-14 MED ORDER — CITRIC ACID-SODIUM CITRATE 334-500 MG/5ML PO SOLN: 30.0000 mL | ORAL | Status: DC | PRN

## 2013-12-14 MED ORDER — BISACODYL 10 MG RE SUPP: 10.0000 mg | Freq: Every day | RECTAL | Status: DC | PRN

## 2013-12-14 MED ORDER — PHENYLEPHRINE 40 MCG/ML (10ML) SYRINGE FOR IV PUSH (FOR BLOOD PRESSURE SUPPORT)
80.0000 ug | PREFILLED_SYRINGE | INTRAVENOUS | Status: DC | PRN
  Filled 2013-12-14: qty 10
  Filled 2013-12-14: qty 2

## 2013-12-14 MED ORDER — DIPHENHYDRAMINE HCL 50 MG/ML IJ SOLN: 12.5000 mg | INTRAMUSCULAR | Status: DC | PRN

## 2013-12-14 MED ORDER — ONDANSETRON HCL 4 MG/2ML IJ SOLN
4.0000 mg | INTRAMUSCULAR | Status: DC | PRN
Start: 2013-12-14 — End: 2013-12-16

## 2013-12-14 MED ORDER — ACETAMINOPHEN 325 MG PO TABS: 650.0000 mg | ORAL_TABLET | ORAL | Status: DC | PRN

## 2013-12-14 MED ORDER — SENNOSIDES-DOCUSATE SODIUM 8.6-50 MG PO TABS
2.0000 | ORAL_TABLET | ORAL | Status: DC
  Administered 2013-12-15 – 2013-12-16 (×2): 2 via ORAL
  Filled 2013-12-14 (×2): qty 2

## 2013-12-14 MED ORDER — LACTATED RINGERS IV SOLN: INTRAVENOUS | Status: DC

## 2013-12-14 MED ORDER — PENICILLIN G POTASSIUM 5000000 UNITS IJ SOLR
5.0000 10*6.[IU] | Freq: Once | INTRAVENOUS | Status: AC
  Administered 2013-12-14: 5 10*6.[IU] via INTRAVENOUS
  Filled 2013-12-14: qty 5

## 2013-12-14 MED ORDER — ONDANSETRON HCL 4 MG/2ML IJ SOLN: 4.0000 mg | Freq: Four times a day (QID) | INTRAMUSCULAR | Status: DC | PRN

## 2013-12-14 MED ORDER — EPHEDRINE 5 MG/ML INJ
10.0000 mg | INTRAVENOUS | Status: DC | PRN
  Filled 2013-12-14: qty 2

## 2013-12-14 MED ORDER — ONDANSETRON HCL 4 MG PO TABS: 4.0000 mg | ORAL_TABLET | ORAL | Status: DC | PRN

## 2013-12-14 MED ORDER — WITCH HAZEL-GLYCERIN EX PADS: 1.0000 "application " | MEDICATED_PAD | CUTANEOUS | Status: DC | PRN

## 2013-12-14 MED ORDER — LIDOCAINE HCL (PF) 1 % IJ SOLN
30.0000 mL | INTRAMUSCULAR | Status: DC | PRN
  Filled 2013-12-14: qty 30

## 2013-12-14 MED ORDER — SIMETHICONE 80 MG PO CHEW: 80.0000 mg | CHEWABLE_TABLET | ORAL | Status: DC | PRN

## 2013-12-14 MED ORDER — OXYTOCIN 40 UNITS IN LACTATED RINGERS INFUSION - SIMPLE MED: 62.5000 mL/h | INTRAVENOUS | Status: DC | PRN

## 2013-12-14 MED ORDER — SODIUM CHLORIDE 0.9 % IV SOLN: 250.0000 mL | INTRAVENOUS | Status: DC | PRN

## 2013-12-14 MED ORDER — TETANUS-DIPHTH-ACELL PERTUSSIS 5-2.5-18.5 LF-MCG/0.5 IM SUSP: 0.5000 mL | Freq: Once | INTRAMUSCULAR | Status: DC

## 2013-12-14 MED ORDER — OXYTOCIN BOLUS FROM INFUSION: 500.0000 mL | INTRAVENOUS | Status: DC

## 2013-12-14 MED ORDER — OXYTOCIN 40 UNITS IN LACTATED RINGERS INFUSION - SIMPLE MED: 62.5000 mL/h | INTRAVENOUS | Status: DC

## 2013-12-14 MED ORDER — TERBUTALINE SULFATE 1 MG/ML IJ SOLN: 0.2500 mg | Freq: Once | INTRAMUSCULAR | Status: DC | PRN

## 2013-12-14 MED ORDER — ZOLPIDEM TARTRATE 5 MG PO TABS: 5.0000 mg | ORAL_TABLET | Freq: Every evening | ORAL | Status: DC | PRN

## 2013-12-14 MED ORDER — LIDOCAINE HCL (PF) 1 % IJ SOLN
INTRAMUSCULAR | Status: DC | PRN
  Administered 2013-12-14 (×3): 5 mL

## 2013-12-14 MED ORDER — LANOLIN HYDROUS EX OINT: TOPICAL_OINTMENT | CUTANEOUS | Status: DC | PRN

## 2013-12-14 MED ORDER — MISOPROSTOL 200 MCG PO TABS
800.0000 ug | ORAL_TABLET | Freq: Once | ORAL | Status: AC
  Administered 2013-12-14: 800 ug via RECTAL
  Filled 2013-12-14: qty 4

## 2013-12-14 MED ORDER — IBUPROFEN 600 MG PO TABS
600.0000 mg | ORAL_TABLET | Freq: Four times a day (QID) | ORAL | Status: DC | PRN
  Administered 2013-12-14 – 2013-12-16 (×3): 600 mg via ORAL
  Filled 2013-12-14 (×2): qty 1

## 2013-12-14 MED ORDER — SODIUM CHLORIDE 0.9 % IJ SOLN: 3.0000 mL | Freq: Two times a day (BID) | INTRAMUSCULAR | Status: DC

## 2013-12-14 MED ORDER — OXYTOCIN 40 UNITS IN LACTATED RINGERS INFUSION - SIMPLE MED
1.0000 m[IU]/min | INTRAVENOUS | Status: DC
  Administered 2013-12-14: 2 m[IU]/min via INTRAVENOUS
  Filled 2013-12-14: qty 1000

## 2013-12-14 MED ORDER — OXYCODONE-ACETAMINOPHEN 5-325 MG PO TABS
1.0000 | ORAL_TABLET | ORAL | Status: DC | PRN
  Filled 2013-12-14 (×7): qty 1

## 2013-12-14 MED ORDER — PENICILLIN G POTASSIUM 5000000 UNITS IJ SOLR
2.5000 10*6.[IU] | INTRAVENOUS | Status: DC
  Administered 2013-12-14: 2.5 10*6.[IU] via INTRAVENOUS
  Filled 2013-12-14 (×8): qty 2.5

## 2013-12-14 MED ORDER — DIBUCAINE 1 % RE OINT: 1.0000 "application " | TOPICAL_OINTMENT | RECTAL | Status: DC | PRN

## 2013-12-14 MED ORDER — PRENATAL MULTIVITAMIN CH
1.0000 | ORAL_TABLET | Freq: Every day | ORAL | Status: DC
  Administered 2013-12-15: 1 via ORAL
  Filled 2013-12-14: qty 1

## 2013-12-14 MED ORDER — FLEET ENEMA 7-19 GM/118ML RE ENEM: 1.0000 | ENEMA | RECTAL | Status: DC | PRN

## 2013-12-14 MED ORDER — LACTATED RINGERS IV SOLN: 500.0000 mL | INTRAVENOUS | Status: DC | PRN

## 2013-12-14 MED ORDER — PHENYLEPHRINE 40 MCG/ML (10ML) SYRINGE FOR IV PUSH (FOR BLOOD PRESSURE SUPPORT)
80.0000 ug | PREFILLED_SYRINGE | INTRAVENOUS | Status: DC | PRN
  Filled 2013-12-14: qty 2

## 2013-12-14 MED ORDER — LACTATED RINGERS IV SOLN: 500.0000 mL | Freq: Once | INTRAVENOUS | Status: DC

## 2013-12-14 MED ORDER — EPHEDRINE 5 MG/ML INJ
10.0000 mg | INTRAVENOUS | Status: DC | PRN
  Filled 2013-12-14: qty 4
  Filled 2013-12-14: qty 2

## 2013-12-14 MED ORDER — LACTATED RINGERS IV SOLN
INTRAVENOUS | Status: DC
  Administered 2013-12-14: 07:00:00 via INTRAVENOUS

## 2013-12-14 MED ORDER — FLEET ENEMA 7-19 GM/118ML RE ENEM: 1.0000 | ENEMA | Freq: Every day | RECTAL | Status: DC | PRN

## 2013-12-14 MED ORDER — DIPHENHYDRAMINE HCL 25 MG PO CAPS
25.0000 mg | ORAL_CAPSULE | Freq: Four times a day (QID) | ORAL | Status: DC | PRN
Start: 2013-12-14 — End: 2013-12-16

## 2013-12-14 MED ORDER — FENTANYL 2.5 MCG/ML BUPIVACAINE 1/10 % EPIDURAL INFUSION (WH - ANES)
INTRAMUSCULAR | Status: DC | PRN
  Administered 2013-12-14: 14 mL/h via EPIDURAL

## 2013-12-14 MED ORDER — MEASLES, MUMPS & RUBELLA VAC ~~LOC~~ INJ
0.5000 mL | INJECTION | Freq: Once | SUBCUTANEOUS | Status: DC
  Filled 2013-12-14: qty 0.5

## 2013-12-14 MED ORDER — OXYCODONE-ACETAMINOPHEN 5-325 MG PO TABS
1.0000 | ORAL_TABLET | ORAL | Status: DC | PRN
  Administered 2013-12-14 – 2013-12-16 (×8): 1 via ORAL
  Filled 2013-12-14: qty 1

## 2013-12-14 MED ORDER — FENTANYL 2.5 MCG/ML BUPIVACAINE 1/10 % EPIDURAL INFUSION (WH - ANES)
14.0000 mL/h | INTRAMUSCULAR | Status: DC | PRN
  Filled 2013-12-14: qty 125

## 2013-12-14 MED ORDER — BENZOCAINE-MENTHOL 20-0.5 % EX AERO
1.0000 "application " | INHALATION_SPRAY | CUTANEOUS | Status: DC | PRN
  Administered 2013-12-15: 1 via TOPICAL
  Filled 2013-12-14: qty 56

## 2013-12-14 NOTE — Anesthesia Preprocedure Evaluation (Signed)
Anesthesia Evaluation  Patient identified by MRN, date of birth, ID band Patient awake    Reviewed: Allergy & Precautions, H&P , Patient's Chart, lab work & pertinent test results  Airway Mallampati: III TM Distance: >3 FB Neck ROM: full    Dental no notable dental hx. (+) Teeth Intact   Pulmonary Current Smoker,  breath sounds clear to auscultation  Pulmonary exam normal       Cardiovascular negative cardio ROS  Rhythm:regular Rate:Normal     Neuro/Psych negative neurological ROS  negative psych ROS   GI/Hepatic negative GI ROS, Neg liver ROS,   Endo/Other  negative endocrine ROS  Renal/GU negative Renal ROS  negative genitourinary   Musculoskeletal   Abdominal Normal abdominal exam  (+)   Peds  Hematology negative hematology ROS (+)   Anesthesia Other Findings   Reproductive/Obstetrics (+) Pregnancy                           Anesthesia Physical  Anesthesia Plan  ASA: II  Anesthesia Plan: Epidural   Post-op Pain Management:    Induction:   Airway Management Planned:   Additional Equipment:   Intra-op Plan:   Post-operative Plan:   Informed Consent: I have reviewed the patients History and Physical, chart, labs and discussed the procedure including the risks, benefits and alternatives for the proposed anesthesia with the patient or authorized representative who has indicated his/her understanding and acceptance.     Plan Discussed with: Anesthesiologist  Anesthesia Plan Comments:         Anesthesia Quick Evaluation

## 2013-12-14 NOTE — H&P (Signed)
[name] [name] is a 23 y.o. G89P1001 female at [redacted]w[redacted]d by LMP presenting w/ srom light MSF @ 0500 and latent phase labor, 3/80/-2, vtx confirmed by u/s on arrival.  Reports active fetal movement, contractions: irregular, vaginal bleeding: none, membranes: ruptured. Denies ha, scotomata, ruq/epigastric pain, n/v.   Initiated prenatal care at [facility] at 36.3 wks.  Pregnancy complicated by late onset care. Most recent u/s 12/03/13 @ 37.4wks: anatomy u/s, limited views of heart, cephalic, afi normal, EFW 6lb4oz/2845g/39%  H/O: Per records, last pregnancy/birth 7yr ago w/ Dr. Gaynell Face, BPs were elevated intrapartum, was started on magnesium and continued 24hr pp.    Prenatal History/Complications:  Past Medical History: Past Medical History  Diagnosis Date  . Chlamydia 10-2011  . Infection   . Headache(784.0)   . Heart palpitations     Past Surgical History: Past Surgical History  Procedure Laterality Date  . No past surgeries      Obstetrical History: OB History   Grav Para Term Preterm Abortions TAB SAB Ect Mult Living   2 1 1  0 0 0 0 0 0 1      Social History: History   Social History  . Marital Status: Single    Spouse Name: N/A    Number of Children: N/A  . Years of Education: N/A   Social History Main Topics  . Smoking status: Light Tobacco Smoker -- 0.25 packs/day for 3 years    Types: Cigarettes  . Smokeless tobacco: Never Used  . Alcohol Use: No  . Drug Use: No  . Sexual Activity: Not Currently    Birth Control/ Protection: None   Other Topics Concern  . None   Social History Narrative  . None    Family History: Family History  Problem Relation Age of Onset  . Anesthesia problems Neg Hx   . Other Neg Hx   . Diabetes Maternal Grandmother   . Hypertension Mother     Allergies: Allergies  Allergen Reactions  . Metronidazole Nausea And Vomiting    No prescriptions prior to admission     Review of Systems  Pertinent pos/neg as indicated in  HPI    Blood pressure 141/78, pulse 79, temperature 97.9 F (36.6 C), temperature source Oral, resp. rate 18, height 5\' 6"  (1.676 m), weight 82.101 kg (181 lb), last menstrual period 03/15/2013, SpO2 100.00%, not currently breastfeeding. General appearance: alert, cooperative and no distress Lungs: clear to auscultation bilaterally Heart: regular rate and rhythm Abdomen: gravid, soft, non-tender Extremities: No edema DTR's 2+ No clonus  Fetal monitoring: FHR: 125 bpm, variability: moderate,  Accelerations: Present,  decelerations:  Absent Uterine activity: irregular  SVE: 3.5/80/-2, vtx, soft, anterior Presentation: cephalic   Prenatal labs: ABO, Rh: --/--/O POS, O POS (01/31 1820) Antibody: NEG (01/31 1820) Rubella:  Immune RPR: NON REACTIVE (01/31 1820)  HBsAg: NEGATIVE (01/31 1820)  HIV: Non-reactive (01/31 0000)  GBS: Positive (03/04 0000)   1 hr Glucola: 78 Genetic screening:  Too late Anatomy US: normal, although limited views of heart   Results for orders placed during the hospital encounter of 12/14/13 (from the past 24 hour(s))  CBC   Collection Time    12/14/13  6:54 AM      Result Value Ref Range   WBC 11.3 (*) 4.0 - 10.5 K/uL   RBC 4.65  3.87 - 5.11 MIL/uL   Hemoglobin 10.4 (*) 12.0 - 15.0 g/dL   HCT 16.1 (*) 09.6 - 04.5 %   MCV 69.0 (*) 78.0 - 100.0  fL   MCH 22.4 (*) 26.0 - 34.0 pg   MCHC 32.4  30.0 - 36.0 g/dL   RDW 81.116.2 (*) 91.411.5 - 78.215.5 %   Platelets 338  150 - 400 K/uL  COMPREHENSIVE METABOLIC PANEL   Collection Time    12/14/13  6:54 AM      Result Value Ref Range   Sodium 139  137 - 147 mEq/L   Potassium 4.0  3.7 - 5.3 mEq/L   Chloride 104  96 - 112 mEq/L   CO2 21  19 - 32 mEq/L   Glucose, Bld 75  70 - 99 mg/dL   BUN 3 (*) 6 - 23 mg/dL   Creatinine, Ser 9.560.49 (*) 0.50 - 1.10 mg/dL   Calcium 8.9  8.4 - 21.310.5 mg/dL   Total Protein 6.8  6.0 - 8.3 g/dL   Albumin 2.5 (*) 3.5 - 5.2 g/dL   AST 14  0 - 37 U/L   ALT 12  0 - 35 U/L   Alkaline  Phosphatase 108  39 - 117 U/L   Total Bilirubin <0.2 (*) 0.3 - 1.2 mg/dL   GFR calc non Af Amer >90  >90 mL/min   GFR calc Af Amer >90  >90 mL/min  PROTEIN / CREATININE RATIO, URINE   Collection Time    12/14/13  9:05 AM      Result Value Ref Range   Creatinine, Urine 105.32     Total Protein, Urine 18.4     PROTEIN CREATININE RATIO 0.17 (*) 0.00 - 0.15     Assessment:  4151w1d SIUP  G2P1001  SROM w/ latent phase labor  Cat I FHR  GBS Positive (03/04 0000)  GHTN, pre-e labs normal  Plan:  Admit to BS  IV pain meds/epidural prn active labor  PCN per protocol for gbs pos  Will begin pitocin augmentation d/t irregular uc's/no cervical change   Mag if bp's become severe range or severe features develop  Anticipate NSVD   [name], Alaa Eyerman Randall CNM, WHNP-BC 12/14/2013, 10:11 AM

## 2013-12-14 NOTE — Progress Notes (Signed)
Patient ID: [name] [name], female   DOB: March 21, 1991, 23 y.o.   MRN: [id number] [name] [name] is a 23 y.o. G2P1001 at 767w1d  admitted for srom and latent phase labor  Subjective: Vaginal pain/pressure w/ uc's despite epidural, uncomfortable/moaning RN just gave epidural pcea dose  Objective: BP 131/65  Pulse 78  Temp(Src) 97.8 F (36.6 C) (Oral)  Resp 18  Ht 5\' 6"  (1.676 m)  Wt 82.101 kg (181 lb)  BMI 29.23 kg/m2  SpO2 100%  LMP 03/15/2013    FHT:  FHR: 120 bpm, variability: moderate,  accelerations:  Present,  decelerations:  Absent UC:   regular, every 1-2.5 minutes  SVE:   Dilation: 6.5 Effacement (%): 90 Station: +1 Exam by:: kim Keith Cancio cnm AROM forebag, small amt blood-tinged fluid  Pitocin @ 10 mu/min  Labs: Lab Results  Component Value Date   WBC 11.3* 12/14/2013   HGB 10.4* 12/14/2013   HCT 32.1* 12/14/2013   MCV 69.0* 12/14/2013   PLT 338 12/14/2013    Assessment / Plan: Augmentation of labor, progressing well  Labor: Progressing normally Fetal Wellbeing:  Category I Pain Control:  Epidural Pre-eclampsia: labs normal, bp's have normalized I/D:  PCN for gbs pos Anticipated MOD:  NSVD  [name], Lauralye Kinn Randall CNM, WHNP-BC 12/14/2013, 3:00 PM

## 2013-12-14 NOTE — Anesthesia Procedure Notes (Signed)
Epidural Patient location during procedure: OB  Staffing Anesthesiologist: Kainat Pizana Performed by: anesthesiologist   Preanesthetic Checklist Completed: patient identified, site marked, surgical consent, pre-op evaluation, timeout performed, IV checked, risks and benefits discussed and monitors and equipment checked  Epidural Patient position: sitting Prep: ChloraPrep Patient monitoring: heart rate, continuous pulse ox and blood pressure Approach: right paramedian Location: L3-L4 Injection technique: LOR saline  Needle:  Needle type: Tuohy  Needle gauge: 17 G Needle length: 9 cm and 9 Needle insertion depth: 5 cm Catheter type: closed end flexible Catheter size: 20 Guage Catheter at skin depth: 10 cm Test dose: negative  Assessment Events: blood not aspirated, injection not painful, no injection resistance, negative IV test and no paresthesia  Additional Notes   Patient tolerated the insertion well without complications.   

## 2013-12-14 NOTE — Lactation Note (Signed)
This note was copied from the chart of [name] [name]. Lactation Consultation Note  [name]'s Date: 12/14/2013 Reason for consult: Initial assessment of this second-time mother and her newborn, now 7 hours of age but sound asleep in crib after a recent feeding with formula.  Mom planning to both breast and formula-feed and baby has only nursed once for 15 minutes during first hour of life, but has received several formula feedings and last feeding was just an hour ago.  LC demonstrated to mom how to hand express and discussed LEAD cautions and reasons to avoid supplement during baby's first 2 weeks.  LC encouraged frequent STS and cue feedings.  LC encouraged review of Baby and Me pp 9, 14 and 20-25 for STS and BF information. LC provided [facility] [employer] Resource brochure and reviewed [facility] services and list of community and web site resources.    Maternal Data Formula Feeding for Exclusion: Yes Reason for exclusion: Mother's choice to formula and breast feed on admission Infant to breast within first hour of birth: Yes (baby nursed for 15 minutes w/LATCH score=6 due to need for repeated assistance) Has patient been taught Hand Expression?: Yes (LC demonstrated and scant drops noted) Does the patient have breastfeeding experience prior to this delivery?: Yes  Feeding Feeding Type: Formula Nipple Type: Slow - flow  LATCH Score/Interventions           initial LATCH score after delivery=6, based on both mom and baby's need for latch assistance and "no swallows noted"           Lactation Tools Discussed/Used   STS, cue feedings, hand expression  Consult Status Consult Status: Follow-up Date: 12/15/13 Follow-up type: In-patient    [name] ParisianBryant, [name] [name] [facility] 12/14/2013, 10:27 PM

## 2013-12-14 NOTE — MAU Note (Signed)
Water broke at 5 am.  Clear with greenish tinge.  Baby moving well per pt. No bleeding.  ? Contractions, feeling pressure.

## 2013-12-15 LAB — CBC
HCT: 30.6 % — ABNORMAL LOW (ref 36.0–46.0)
HEMOGLOBIN: 9.8 g/dL — AB (ref 12.0–15.0)
MCH: 22.2 pg — ABNORMAL LOW (ref 26.0–34.0)
MCHC: 32 g/dL (ref 30.0–36.0)
MCV: 69.4 fL — ABNORMAL LOW (ref 78.0–100.0)
PLATELETS: 314 10*3/uL (ref 150–400)
RBC: 4.41 MIL/uL (ref 3.87–5.11)
RDW: 15.8 % — ABNORMAL HIGH (ref 11.5–15.5)
WBC: 10.7 10*3/uL — ABNORMAL HIGH (ref 4.0–10.5)

## 2013-12-15 NOTE — Progress Notes (Signed)
Post Partum Day 1 Subjective: Eating, drinking, voiding, ambulating well.  +flatus.  Lochia wnl.  Reports bad cramping, but relieved by percocet. Denies dizziness, lightheadedness, or sob. No complaints.  Denies ha, scotomata, ruq/epigastric pain, n/v.   Received cytotec pr ~1hr after birth d/t boggy uterus/no PPH  Objective: Blood pressure 144/84, pulse 68, temperature 97.8 F (36.6 C), temperature source Oral, resp. rate 19, height 5\' 6"  (1.676 m), weight 82.101 kg (181 lb), last menstrual period 03/15/2013, SpO2 100.00%, unknown if currently breastfeeding.  Physical Exam:  General: alert, cooperative and no distress Lochia: appropriate Uterine Fundus: firm Incision: n/a DVT Evaluation: No evidence of DVT seen on physical exam. Negative Homan's sign. No cords or calf tenderness. No significant calf/ankle edema. DTRS2+ No clonus   Recent Labs  12/14/13 0654 12/15/13 0614  HGB 10.4* 9.8*  HCT 32.1* 30.6*    Assessment/Plan: Possible d/c this afternoon, birth was yesterday after 3pm. BPs still slightly elevated, but no s/s pre-e. Reviewed pre-e s/s to report.  Breast/bottlefeeding Undecided about contraception   LOS: 1 day   [name], Yides Saidi Randall 12/15/2013, 7:13 AM

## 2013-12-15 NOTE — Progress Notes (Addendum)
Clinical Social Work Department PSYCHOSOCIAL ASSESSMENT - MATERNAL/CHILD 12/15/2013  Patient:  [name] [name]  Account Number:  [id number]  Admit Date:  12/14/2013  Childs Name:   [name] [name]    Clinical Social Worker:  CUMI BEVEL, LCSW   Date/Time:  12/15/2013 10:45 AM  Date Referred:  12/14/2013   Referral source  Central Nursery     Referred reason  Late Prenatal Care   Other referral source:    I:  FAMILY / HOME ENVIRONMENT Child's legal guardian:  PARENT  Guardian - Name Guardian - Age Guardian - Address  [name] [name] 22 [address]  [place], [place] [address]  [name] [name]     Other household support members/support persons Other support:    II  PSYCHOSOCIAL DATA Information Source:    Financial and Community Resources Employment:   supported by maternal grandmother   Financial resources:  Medicaid If Medicaid - County:   Other  Mother plans to apply for WIC and Food Stamps     School / Grade:   Maternity Care Coordinator / Child Services Coordination / Early Interventions:  Cultural issues impacting care:    III  STRENGTHS Strengths  Supportive family/friends  Home prepared for Child (including basic supplies)  Adequate Resources   Strength comment:    IV  RISK FACTORS AND CURRENT PROBLEMS Current Problem:       V  SOCIAL WORK ASSESSMENT Acknowledged order for social work consult regarding mother having late prenatal care.  Met with mother who was pleasant and receptive to social work intervention.  She is a single parent with one other dependent age 21.  Several visitors were present during CSW visit.   FOB is reportedly uninvolved.  Informed that she has had no contact with him since she informed him of the pregnancy months ago. Informed that she had limited PNC because she moved from 3 different counties and had difficulty getting the Medicaid active in that particular county every time she moved.  She denies any hx of substance abuse or mental  illness. Informed her of the hospital's drug screening policy.   UDS on newborn is negative.    Mother informed of social work availability.      VI SOCIAL WORK PLAN Social Work Plan   No Barriers to Discharge   Type of pt/family education:   If child protective services report - county:   If child protective services report - date:   Information/referral to community resources comment:   Other social work plan:   Pediatrician:  mother selected [facility]  Will monitor drug screen.    

## 2013-12-15 NOTE — Anesthesia Postprocedure Evaluation (Signed)
  Anesthesia Post-op Note  Patient: [name] [name]  Procedure(s) Performed: * No procedures listed *  Patient Location: Mother/Baby  Anesthesia Type:Epidural  Level of Consciousness: awake and alert   Airway and Oxygen Therapy: Patient Spontanous Breathing  Post-op Pain: mild  Post-op Assessment: Patient's Cardiovascular Status Stable, Respiratory Function Stable, No signs of Nausea or vomiting, Pain level controlled, No headache, No residual numbness and No residual motor weakness  Post-op Vital Signs: stable  Complications: No apparent anesthesia complications

## 2013-12-16 MED ORDER — PRENATAL MULTIVITAMIN CH: 1.0000 | ORAL_TABLET | Freq: Every day | ORAL | Status: DC

## 2013-12-16 MED ORDER — IBUPROFEN 600 MG PO TABS: 600.0000 mg | ORAL_TABLET | Freq: Four times a day (QID) | ORAL | Status: DC | PRN

## 2013-12-16 NOTE — Discharge Summary (Signed)
Attestation of Attending Supervision of Advanced Practitioner (CNM/NP): Evaluation and management procedures were performed by the Advanced Practitioner under my supervision and collaboration.  I have reviewed the Advanced Practitioner's note and chart, and I agree with the management and plan.  Meliya Mcconahy 12/16/2013 8:57 AM

## 2013-12-16 NOTE — Progress Notes (Signed)
Ur chart review completed.  

## 2013-12-16 NOTE — Lactation Note (Addendum)
This note was copied from the chart of [name] Renella Cunasrika Kissick. Lactation Consultation Note  [name] Reason for consult:  (baby transported back to moms room from post ) Baby was in the nursery for a frenotomy  by Dr. Ezequiel EssexGable , and after procedure Wallie RenshawNatalie Branch adm Tylenol po per Dr. Magdalene Mollyrder. LC transported baby back to Room #132 , baby calm and awake , Changed wet diaper and assisted mom with latching in football position. Baby fed for 8 mins sustaining latch with a few swallows and fell asleep. LC asked mom to call on nurses light for re-latch check , also showed mom tongue  Exercises. No further bleeding noted. Reviewed sore nipple and engorgement prevention and tx . Mom had already received a hand pump and comfort gels.  Mom aware of the [facility] and [facility].   Maternal Data Has patient been taught Hand Expression?: Yes  Feeding Feeding Type: Breast Fed Length of feed: 8 min (sustained pattern fro 8 mins , few swallows noted , sluggish from Tyenol )  LATCH Score/Interventions Latch: Grasps breast easily, tongue down, lips flanged, rhythmical sucking. Intervention(s): Adjust position;Assist with latch;Breast massage;Breast compression  Audible Swallowing: A few with stimulation  Type of Nipple: Everted at rest and after stimulation  Comfort (Breast/Nipple): Soft / non-tender  Problem noted: Mild/Moderate discomfort  Hold (Positioning): Assistance needed to correctly position infant at breast and maintain latch. (worked on depth ) Intervention(s): Breastfeeding basics reviewed;Support Pillows;Position options;Skin to skin  LATCH Score: 8  Lactation Tools Discussed/Used Tools: Comfort gels (per mom already has comfort gels ) Breast pump type: Manual (already given to mom ) WIC Program: Yes (per mom [place] )   Consult Status Consult Status: Follow-up Date: 12/16/13 Follow-up type: In-patient    Kathrin Greathouseorio, [name] Drennan  [name] [name], 10:46 AM

## 2013-12-16 NOTE — Discharge Summary (Signed)
Obstetric Discharge Summary Reason for Admission: rupture of membranes Prenatal Procedures: none Intrapartum Procedures: spontaneous vaginal delivery and GBS prophylaxis Postpartum Procedures: none Complications-Operative and Postpartum: hemostatic 1st degree Lt periurethral- no repair needed Hemoglobin  Date Value Ref Range Status  12/15/2013 9.8* 12.0 - 15.0 g/dL Final     HCT  Date Value Ref Range Status  12/15/2013 30.6* 36.0 - 46.0 % Final    Discharge Diagnoses: Term Pregnancy-delivered  Hospital Course:  [name] [name] is a 23 y.o. [id number] who presented with SROM.  She had a uncomplicated SVD with hemostatic 1st degree Lt periurethral- no repair needed. She was able to ambulate, tolerate PO and void normally. She was discharged home with instructions for postpartum care.    Delivery Note  Pt progressed rapidly after AROM of forebag, developed strong urge to push, and after pushing w/ 2uc's, at 3:11 PM a viable female was delivered via Vaginal, Spontaneous Delivery (Presentation:Right Occiput Anterior) w/ compound posterior (Right) hand. Body cord around abdomen, reduced after birth. APGAR: 9, 9; weight: pending . Infant placed directly on mom's abdomen for bonding/skin-to-skin. Cord allowed to stop pulsing, and was clamped x 2, and cut by pt's family member/friend.  Placenta status: spontaneously, intact. Cord: 3 vessels with the following complications: None.  Anesthesia: Epidural  Episiotomy: None  Lacerations: shallow hemostatic 1st degree Lt periurethral- no repair needed  Suture Repair: n/a  Est. Blood Loss (mL): 250ml  Mom to postpartum. Baby to Couplet care / Skin to Skin.  Plans to breast/bottlefeed  Undecided about contraception, discussed options   Physical Exam:  General: alert, cooperative and no distress Lochia: appropriate Uterine Fundus: firm DVT Evaluation: No evidence of DVT seen on physical exam. Negative Homan's sign. No cords or calf tenderness. No  significant calf/ankle edema.  Discharge Information: Date: 12/16/2013 Activity: pelvic rest Diet: routine Medications: Ibuprofen Baby feeding: plans to breastfeed, plans to bottle feed Contraception: Undecided; considering IUD vs nuvaring Condition: stable Instructions: refer to practice specific booklet Discharge to: home   Newborn Data: Live born female  Birth Weight: 6 lb 4.5 oz (2850 g) APGAR: 9, 9  Home with mother.  Wenda LowJames Joyner, MD [facility] PGY-1 12/16/2013, 7:53 AM  I spoke with and examined patient and agree with resident's note and plan of care.  Tawana ScaleMichael Ryan Valon Glasscock, MD OB Fellow 12/16/2013 8:35 AM

## 2013-12-16 NOTE — Discharge Instructions (Signed)
Postpartum Care After Vaginal Delivery °After you deliver your newborn (postpartum period), the usual stay in the hospital is 24 72 hours. If there were problems with your labor or delivery, or if you have other medical problems, you might be in the hospital longer.  °While you are in the hospital, you will receive help and instructions on how to care for yourself and your newborn during the postpartum period.  °While you are in the hospital: °· Be sure to tell your nurses if you have pain or discomfort, as well as where you feel the pain and what makes the pain worse. °· If you had an incision made near your vagina (episiotomy) or if you had some tearing during delivery, the nurses may put ice packs on your episiotomy or tear. The ice packs may help to reduce the pain and swelling. °· If you are breastfeeding, you may feel uncomfortable contractions of your uterus for a couple of weeks. This is normal. The contractions help your uterus get back to normal size. °· It is normal to have some bleeding after delivery. °· For the first 1 3 days after delivery, the flow is red and the amount may be similar to a period. °· It is common for the flow to start and stop. °· In the first few days, you may pass some small clots. Let your nurses know if you begin to pass large clots or your flow increases. °· Do not  flush blood clots down the toilet before having the nurse look at them. °· During the next 3 10 days after delivery, your flow should become more watery and pink or brown-tinged in color. °· Ten to fourteen days after delivery, your flow should be a small amount of yellowish-white discharge. °· The amount of your flow will decrease over the first few weeks after delivery. Your flow may stop in 6 8 weeks. Most women have had their flow stop by 12 weeks after delivery. °· You should change your sanitary pads frequently. °· Wash your hands thoroughly with soap and water for at least 20 seconds after changing pads, using  the toilet, or before holding or feeding your newborn. °· You should feel like you need to empty your bladder within the first 6 8 hours after delivery. °· In case you become weak, lightheaded, or faint, call your nurse before you get out of bed for the first time and before you take a shower for the first time. °· Within the first few days after delivery, your breasts may begin to feel tender and full. This is called engorgement. Breast tenderness usually goes away within 48 72 hours after engorgement occurs. You may also notice milk leaking from your breasts. If you are not breastfeeding, do not stimulate your breasts. Breast stimulation can make your breasts produce more milk. °· Spending as much time as possible with your newborn is very important. During this time, you and your newborn can feel close and get to know each other. Having your newborn stay in your room (rooming in) will help to strengthen the bond with your newborn.  It will give you time to get to know your newborn and become comfortable caring for your newborn. °· Your hormones change after delivery. Sometimes the hormone changes can temporarily cause you to feel sad or tearful. These feelings should not last more than a few days. If these feelings last longer than that, you should talk to your caregiver. °· If desired, talk to your caregiver about   methods of family planning or contraception.  Talk to your caregiver about immunizations. Your caregiver may want you to have the following immunizations before leaving the hospital:  Tetanus, diphtheria, and pertussis (Tdap) or tetanus and diphtheria (Td) immunization. It is very important that you and your family (including grandparents) or others caring for your newborn are up-to-date with the Tdap or Td immunizations. The Tdap or Td immunization can help protect your newborn from getting ill.  Rubella immunization.  Varicella (chickenpox) immunization.  Influenza immunization. You should  receive this annual immunization if you did not receive the immunization during your pregnancy. Document Released: 07/17/2007 Document Revised: 06/13/2012 Document Reviewed: 05/16/2012 [facility] Patient Information 2014 [place], [place].  Contraception Choices Birth control (contraception) is the use of any methods or devices to stop pregnancy from happening. Below are some methods to help avoid pregnancy. HORMONAL BIRTH CONTROL  A small tube put under the skin of the upper arm (implant). The tube can stay in place for 3 years. The implant must be taken out after 3 years.  Shots given every 3 months.  Pills taken every day.  Patches that are changed once a week.  A ring put into the vagina (vaginal ring). The ring is left in place for 3 weeks and removed for 1 week. Then, a new ring is put in the vagina.  Emergency birth control pills taken after unprotected sex (intercourse). BARRIER BIRTH CONTROL   A thin covering worn on the penis (female condom) during sex.  A soft, loose covering put into the vagina (female condom) before sex.  A rubber bowl that sits over the cervix (diaphragm). The bowl must be made for you. The bowl is put into the vagina before sex. The bowl is left in place for 6 to 8 hours after sex.  A small, soft cup that fits over the cervix (cervical cap). The cup must be made for you. The cup can be left in place for 48 hours after sex.  A sponge that is put into the vagina before sex.  A chemical that kills or stops sperm from getting into the cervix and uterus (spermicide). The chemical may be a cream, jelly, foam, or pill. INTRAUTERINE (IUD) BIRTH CONTROL   IUD birth control is a small, T-shaped piece of plastic. The plastic is put inside the uterus. There are 2 types of IUD:  Copper IUD. The IUD is covered in copper wire. The copper makes a fluid that kills sperm. It can stay in place for 10 years.  Hormone IUD. The hormone stops pregnancy from happening. It can  stay in place for 5 years. PERMANENT METHODS  When the woman has her fallopian tubes sealed, tied, or blocked during surgery. This stops the egg from traveling to the uterus.  The doctor places a small coil or insert into each fallopian tube. This causes scar tissue to form and blocks the fallopian tubes.  When the female has the tubes that carry sperm tied off (vasectomy). NATURAL FAMILY PLANNING BIRTH CONTROL   Natural family planning means not having sex or using barrier birth control on the days the woman could become pregnant.  Use a calendar to keep track of the length of each period and know the days she can get pregnant.  Avoid sex during ovulation.  Use a thermometer to measure body temperature. Also watch for symptoms of ovulation.  Time sex to be after the woman has ovulated. Use condoms to help protect yourself against sexually transmitted infections (STIs).  Do this no matter what type of birth control you use. Talk to your doctor about which type of birth control is best for you. Document Released: 07/17/2009 Document Revised: 05/22/2013 Document Reviewed: 04/10/2013 [facility] Patient Information 2014 [place], [place].

## 2013-12-17 ENCOUNTER — Encounter: Payer: Self-pay | Admitting: Obstetrics & Gynecology

## 2013-12-19 NOTE — H&P (Signed)
Attestation of Attending Supervision of Advanced Practitioner (CNM/NP): Evaluation and management procedures were performed by the Advanced Practitioner under my supervision and collaboration. I have reviewed the Advanced Practitioner's note and chart, and I agree with the management and plan.  Lory Galan H. 2:51 PM   

## 2013-12-23 ENCOUNTER — Encounter: Payer: Medicaid Other | Admitting: Family Medicine

## 2014-01-23 ENCOUNTER — Ambulatory Visit (INDEPENDENT_AMBULATORY_CARE_PROVIDER_SITE_OTHER): Payer: Medicaid Other | Admitting: Obstetrics & Gynecology

## 2014-01-23 ENCOUNTER — Encounter: Payer: Self-pay | Admitting: Obstetrics & Gynecology

## 2014-01-23 VITALS — BP 130/78 | HR 86 | Temp 98.7°F | Wt 161.6 lb

## 2014-01-23 DIAGNOSIS — G43009 Migraine without aura, not intractable, without status migrainosus: Secondary | ICD-10-CM

## 2014-01-23 DIAGNOSIS — Z309 Encounter for contraceptive management, unspecified: Secondary | ICD-10-CM

## 2014-01-23 LAB — POCT URINALYSIS DIP (DEVICE)
BILIRUBIN URINE: NEGATIVE
GLUCOSE, UA: NEGATIVE mg/dL
Hgb urine dipstick: NEGATIVE
LEUKOCYTES UA: NEGATIVE
Nitrite: NEGATIVE
Protein, ur: 30 mg/dL — AB
Specific Gravity, Urine: 1.02 (ref 1.005–1.030)
Urobilinogen, UA: 1 mg/dL (ref 0.0–1.0)
pH: 7 (ref 5.0–8.0)

## 2014-01-23 MED ORDER — ETONOGESTREL-ETHINYL ESTRADIOL 0.12-0.015 MG/24HR VA RING: VAGINAL_RING | VAGINAL | Status: DC

## 2014-01-23 MED ORDER — BUTALBITAL-APAP-CAFFEINE 50-500-40 MG PO TABS: 1.0000 | ORAL_TABLET | ORAL | Status: DC | PRN

## 2014-01-23 NOTE — Progress Notes (Signed)
Patient ID: [name] Cunasrika Dinan, female   DOB: 11-22-1990, 23 y.o.   MRN: [id number] Subjective:     [name] [name] is a 23 y.o. female who presents for a postpartum visit. She is 5 weeks postpartum following a spontaneous vaginal delivery. I have fully reviewed the prenatal and intrapartum course. The delivery was at 39 gestational weeks. Outcome: spontaneous vaginal delivery. Anesthesia: epidural. Postpartum course has been uncomplicated. Baby's course has been normal. Baby is feeding by breast milk by pump. Bleeding no bleeding. Bowel function is normal. Bladder function is normal. Patient is not sexually active. Contraception method is none. Postpartum depression screening: negative.  Pt reports increased HA since delivery. She reports a h/o HA but now they are not responding to Motirn.   The following portions of the patient's history were reviewed and updated as appropriate: allergies, current medications, past family history, past medical history, past social history, past surgical history and problem list.  Review of Systems A comprehensive review of systems was negative.   Objective:    BP 130/78  Pulse 86  Temp(Src) 98.7 F (37.1 C) (Oral)  Wt 161 lb 9.6 oz (73.301 kg)  Breastfeeding? Yes  General:  alert and no distress           Abdomen: soft, non-tender; bowel sounds normal; no masses,  no organomegaly                          Assessment:     5 weeks postpartum exam. Pap smear not done at today's visit.   Plan:    1. Contraception: NuvaRing vaginal inserts 2. Fioricet prn for HA 3. Follow up in: 1 year or as needed.

## 2014-01-23 NOTE — Progress Notes (Deleted)
Patient ID: [name] [name], female   DOB: [birth year], 23 y.o.   MRN: [id number]

## 2014-01-23 NOTE — Progress Notes (Signed)
Patient reports that she has been experiencing migraines with visual disturbances for the past two weeks. She has tried taking ibuprofen but it is not helping.

## 2014-01-23 NOTE — Patient Instructions (Signed)

## 2014-04-24 ENCOUNTER — Emergency Department (HOSPITAL_COMMUNITY)
Admission: EM | Admit: 2014-04-24 | Discharge: 2014-04-24 | Disposition: A | Discharge status: Home / Self Care | Payer: Medicaid Other | Attending: Emergency Medicine | Admitting: Emergency Medicine

## 2014-04-24 ENCOUNTER — Encounter (HOSPITAL_COMMUNITY): Payer: Self-pay | Admitting: Emergency Medicine

## 2014-04-24 DIAGNOSIS — B349 Viral infection, unspecified: Secondary | ICD-10-CM

## 2014-04-24 DIAGNOSIS — F17200 Nicotine dependence, unspecified, uncomplicated: Secondary | ICD-10-CM | POA: Diagnosis not present

## 2014-04-24 DIAGNOSIS — B9789 Other viral agents as the cause of diseases classified elsewhere: Secondary | ICD-10-CM | POA: Diagnosis not present

## 2014-04-24 DIAGNOSIS — R509 Fever, unspecified: Secondary | ICD-10-CM | POA: Diagnosis present

## 2014-04-24 DIAGNOSIS — Z79899 Other long term (current) drug therapy: Secondary | ICD-10-CM | POA: Diagnosis not present

## 2014-04-24 LAB — RAPID STREP SCREEN (MED CTR MEBANE ONLY): STREPTOCOCCUS, GROUP A SCREEN (DIRECT): NEGATIVE

## 2014-04-24 MED ORDER — HYDROCODONE-ACETAMINOPHEN 7.5-325 MG/15ML PO SOLN: 10.0000 mL | Freq: Four times a day (QID) | ORAL | Status: DC | PRN

## 2014-04-24 MED ORDER — ACETAMINOPHEN 325 MG PO TABS
650.0000 mg | ORAL_TABLET | Freq: Once | ORAL | Status: AC
  Administered 2014-04-24: 650 mg via ORAL
  Filled 2014-04-24: qty 2

## 2014-04-24 NOTE — ED Notes (Signed)
Pt in c/o generalized body aches and intermittent fever since yesterday, denies other complaints, no medications PTA

## 2014-04-24 NOTE — ED Provider Notes (Signed)
CSN: [id number]     Arrival date & time 04/24/14  1525 History  This chart was scribed for non-physician practitioner, Fayrene HelperBowie Patriciaann Rabanal, PA-C, working with Vanetta MuldersScott Zackowski, MD, by Bronson CurbJacqueline Melvin, ED Scribe. This patient was seen in room TR09C/TR09C and the patient's care was started at 4:55 PM.    Chief Complaint  Patient presents with  . Generalized Body Aches  . Fever      The history is provided by the patient. No language interpreter was used.    HPI Comments: [name] [name] is a 23 y.o. female who presents to the Emergency Department complaining of constant, generalized body aches and intermittent fever (Triage Temp 102.6 F) that began yesterday morning. Patient states she woke up, yesterday morning, feeling sick. She reports sick contacts (her daughters recently had a cold). There is associated chills,decreased appetite, discomfort when swallowing, sore throat, and nausea. Patient has taken Bayer aspirin with no relief. She denies rhinorrhea, sneezing, coughing, bilateral ear pain, emesis or diarrhea.  Patient is not established with PCP. Pt is not breast feeding.    Past Medical History  Diagnosis Date  . Chlamydia 10-2011  . Infection   . Headache(784.0)   . Heart palpitations    Past Surgical History  Procedure Laterality Date  . No past surgeries     Family History  Problem Relation Age of Onset  . Anesthesia problems Neg Hx   . Other Neg Hx   . Diabetes Maternal Grandmother   . Hypertension Mother    History  Substance Use Topics  . Smoking status: Light Tobacco Smoker -- 0.25 packs/day for 3 years    Types: Cigarettes  . Smokeless tobacco: Never Used  . Alcohol Use: No   OB History   Grav Para Term Preterm Abortions TAB SAB Ect Mult Living   2 2 2  0 0 0 0 0 0 2     Review of Systems  Constitutional: Positive for appetite change (decreased appetite).  HENT: Positive for trouble swallowing. Negative for sneezing.       Allergies  Metronidazole  Home  Medications   Prior to Admission medications   Medication Sig Start Date End Date Taking? Authorizing Provider  butalbital-acetaminophen-caffeine (ESGIC PLUS) 50-500-40 MG per tablet Take 1-2 tablets by mouth every 4 (four) hours as needed for pain or headache. 01/23/14   Willodean Rosenthalarolyn Harraway-Smith, MD  etonogestrel-ethinyl estradiol (NUVARING) 0.12-0.015 MG/24HR vaginal ring Insert vaginally and leave in place for 3 consecutive weeks, then remove for 1 week. 01/23/14   Willodean Rosenthalarolyn Harraway-Smith, MD  ibuprofen (ADVIL,MOTRIN) 600 MG tablet Take 1 tablet (600 mg total) by mouth every 6 (six) hours as needed (pain scale < 4). 12/16/13   Wenda LowJames Joyner, MD  Prenatal Vit-Fe Fumarate-FA (PRENATAL MULTIVITAMIN) TABS tablet Take 1 tablet by mouth daily at 12 noon. 12/16/13   Wenda LowJames Joyner, MD   BP 124/71  Pulse 112  Temp(Src) 102.6 F (39.2 C) (Oral)  Resp 20  Wt 146 lb 11.2 oz (66.543 kg)  SpO2 100%  LMP 04/24/2014 Physical Exam  Nursing note and vitals reviewed. Constitutional: She is oriented to person, place, and time. She appears well-developed and well-nourished. No distress.  HENT:  Head: Normocephalic and atraumatic.  Nose: Nose normal.  Mouth/Throat: Uvula is midline. No trismus in the jaw.  No tonsillar enlargement or exudate.  Eyes: Conjunctivae and EOM are normal.  Neck: Neck supple. No tracheal deviation present.  Mild anterior cervical lymphadenopathy.  Cardiovascular: Normal rate.   Pulmonary/Chest: Effort normal. No respiratory  distress.  Musculoskeletal: Normal range of motion.  Lymphadenopathy:    She has cervical adenopathy.  Neurological: She is alert and oriented to person, place, and time.  Skin: Skin is warm and dry.  Psychiatric: She has a normal mood and affect. Her behavior is normal.    ED Course  Procedures (including critical care time)  DIAGNOSTIC STUDIES: Oxygen Saturation is 100% on room air, normal by my interpretation.    COORDINATION OF CARE: At 1700  Discussed treatment plan with patient which includes rapid strep screen. Patient agrees.   5:47 PM Strep neg, reassurance given.  Will treat sxs.    Labs Review Labs Reviewed  RAPID STREP SCREEN  CULTURE, GROUP A STREP    Imaging Review No results found.   EKG Interpretation None      MDM   Final diagnoses:  Viral syndrome    BP 115/68  Pulse 91  Temp(Src) 100.1 F (37.8 C) (Oral)  Resp 18  Wt 146 lb 11.2 oz (66.543 kg)  SpO2 100%  LMP 04/24/2014   I personally performed the services described in this documentation, which was scribed in my presence. The recorded information has been reviewed and is accurate.     Fayrene Helper, PA-C 04/24/14 [phone]

## 2014-04-24 NOTE — Discharge Instructions (Signed)

## 2014-04-24 NOTE — ED Provider Notes (Signed)
Medical screening examination/treatment/procedure(s) were performed by non-physician practitioner and as supervising physician I was immediately available for consultation/collaboration.   EKG Interpretation None        Vanetta MuldersScott Jeter Tomey, MD 04/24/14 1851

## 2014-04-26 LAB — CULTURE, GROUP A STREP

## 2014-05-29 ENCOUNTER — Emergency Department (HOSPITAL_COMMUNITY)
Admission: EM | Admit: 2014-05-29 | Discharge: 2014-05-29 | Disposition: A | Discharge status: Home / Self Care | Payer: Medicaid Other | Attending: Emergency Medicine | Admitting: Emergency Medicine

## 2014-05-29 ENCOUNTER — Encounter (HOSPITAL_COMMUNITY): Payer: Self-pay | Admitting: Emergency Medicine

## 2014-05-29 DIAGNOSIS — F17200 Nicotine dependence, unspecified, uncomplicated: Secondary | ICD-10-CM | POA: Insufficient documentation

## 2014-05-29 DIAGNOSIS — Z8619 Personal history of other infectious and parasitic diseases: Secondary | ICD-10-CM | POA: Diagnosis not present

## 2014-05-29 DIAGNOSIS — J029 Acute pharyngitis, unspecified: Secondary | ICD-10-CM | POA: Insufficient documentation

## 2014-05-29 LAB — RAPID STREP SCREEN (MED CTR MEBANE ONLY): Streptococcus, Group A Screen (Direct): NEGATIVE

## 2014-05-29 MED ORDER — IBUPROFEN 600 MG PO TABS: 600.0000 mg | ORAL_TABLET | Freq: Four times a day (QID) | ORAL | Status: DC | PRN

## 2014-05-29 NOTE — Discharge Instructions (Signed)
Your strep test is negative Take ibuprofen on a regular basis for your symptoms of the next several days.   Get plenty of rest.  Drink plenty of fluids

## 2014-05-29 NOTE — ED Provider Notes (Signed)
Medical screening examination/treatment/procedure(s) were performed by non-physician practitioner and as supervising physician I was immediately available for consultation/collaboration.   EKG Interpretation None        Sumi Lye, MD 05/29/14 2348 

## 2014-05-29 NOTE — ED Provider Notes (Signed)
CSN: [id number]     Arrival date & time 05/29/14  1949 History   None    Chief Complaint  Patient presents with  . Sore Throat     (Consider location/radiation/quality/duration/timing/severity/associated sxs/prior Treatment) HPI Comments: Patient states she's had sore throat, myalgias, and low-grade temperature since Monday.  She's taken some ibuprofen on occasion.  Denies any nausea, vomiting, diarrhea  Patient is a 23 y.o. female presenting with pharyngitis. The history is provided by the patient.  Sore Throat This is a new problem. The current episode started in the past 7 days. The problem occurs constantly. The problem has been unchanged. Associated symptoms include a fever, myalgias and a sore throat. Pertinent negatives include no abdominal pain, coughing, nausea or vomiting. Nothing aggravates the symptoms. She has tried nothing for the symptoms. The treatment provided no relief.    Past Medical History  Diagnosis Date  . Chlamydia 10-2011  . Infection   . Headache(784.0)   . Heart palpitations    Past Surgical History  Procedure Laterality Date  . No past surgeries     Family History  Problem Relation Age of Onset  . Anesthesia problems Neg Hx   . Other Neg Hx   . Diabetes Maternal Grandmother   . Hypertension Mother    History  Substance Use Topics  . Smoking status: Light Tobacco Smoker -- 0.25 packs/day for 3 years    Types: Cigarettes  . Smokeless tobacco: Never Used  . Alcohol Use: No   OB History   Grav Para Term Preterm Abortions TAB SAB Ect Mult Living   0 0 0 0 0 0 2     Review of Systems  Constitutional: Positive for fever.  HENT: Positive for sore throat. Negative for trouble swallowing.   Respiratory: Negative for cough and shortness of breath.   Gastrointestinal: Negative for nausea, vomiting, abdominal pain and diarrhea.  Musculoskeletal: Positive for myalgias.  All other systems reviewed and are negative.     Allergies   Metronidazole  Home Medications   Prior to Admission medications   Medication Sig Start Date End Date Taking? Authorizing Provider  ibuprofen (ADVIL,MOTRIN) 600 MG tablet Take 1 tablet (600 mg total) by mouth every 6 (six) hours as needed. 05/29/14   Arman Filter, NP   BP 125/72  Pulse 90  Temp(Src) 99.3 F (37.4 C) (Oral)  Resp 18  Ht  (1.676 m)  Wt 140 lb (63.504 kg)  BMI 22.61 kg/m2  SpO2 98%  LMP 05/11/2014 Physical Exam  Vitals reviewed. Constitutional: She is oriented to person, place, and time. She appears well-developed and well-nourished. No distress.  HENT:  Head: Normocephalic and atraumatic.  Right Ear: External ear normal.  Left Ear: External ear normal.  Mouth/Throat: Oropharynx is clear and moist.  Eyes: Pupils are equal, round, and reactive to light.  Neck: Normal range of motion.  Cardiovascular: Normal rate and regular rhythm.   Pulmonary/Chest: Effort normal and breath sounds normal.  Musculoskeletal: Normal range of motion.  Lymphadenopathy:    She has no cervical adenopathy.  Neurological: She is alert and oriented to person, place, and time.  Skin: Skin is warm. No rash noted.    ED Course  Procedures (including critical care time) Labs Review Labs Reviewed  RAPID STREP SCREEN    Imaging Review No results found.   EKG Interpretation None      MDM   Final diagnoses:  Viral pharyngitis     I recommend  that the patient.  Drink plenty of fluids, rest, and take ibuprofen on a regular basis for the next several days.  Her strep test is negative    Arman Filter, NP 05/29/14 2147

## 2014-05-29 NOTE — ED Notes (Signed)
Pt in c/o sore throat since Monday with body aches

## 2014-05-31 LAB — CULTURE, GROUP A STREP

## 2014-08-04 ENCOUNTER — Encounter (HOSPITAL_COMMUNITY): Payer: Self-pay | Admitting: Emergency Medicine

## 2015-02-09 ENCOUNTER — Emergency Department
Admission: EM | Admit: 2015-02-09 | Discharge: 2015-02-09 | Disposition: A | Discharge status: Home / Self Care | Payer: Medicaid Other | Attending: Emergency Medicine | Admitting: Emergency Medicine

## 2015-02-09 DIAGNOSIS — A599 Trichomoniasis, unspecified: Secondary | ICD-10-CM

## 2015-02-09 DIAGNOSIS — Z3202 Encounter for pregnancy test, result negative: Secondary | ICD-10-CM | POA: Diagnosis not present

## 2015-02-09 DIAGNOSIS — A5901 Trichomonal vulvovaginitis: Secondary | ICD-10-CM | POA: Insufficient documentation

## 2015-02-09 DIAGNOSIS — Z720 Tobacco use: Secondary | ICD-10-CM | POA: Insufficient documentation

## 2015-02-09 DIAGNOSIS — N898 Other specified noninflammatory disorders of vagina: Secondary | ICD-10-CM | POA: Diagnosis present

## 2015-02-09 DIAGNOSIS — A64 Unspecified sexually transmitted disease: Secondary | ICD-10-CM

## 2015-02-09 LAB — URINALYSIS COMPLETE WITH MICROSCOPIC (ARMC ONLY)
BILIRUBIN URINE: NEGATIVE
GLUCOSE, UA: NEGATIVE mg/dL
HGB URINE DIPSTICK: NEGATIVE
Nitrite: POSITIVE — AB
Protein, ur: 30 mg/dL — AB
Specific Gravity, Urine: 1.028 (ref 1.005–1.030)
pH: 6 (ref 5.0–8.0)

## 2015-02-09 LAB — POCT PREGNANCY, URINE: Preg Test, Ur: NEGATIVE

## 2015-02-09 LAB — CHLAMYDIA/NGC RT PCR (ARMC ONLY)
Chlamydia Tr: DETECTED
N gonorrhoeae: NOT DETECTED

## 2015-02-09 LAB — WET PREP, GENITAL
CLUE CELLS WET PREP: NONE SEEN
Yeast Wet Prep HPF POC: NONE SEEN

## 2015-02-09 MED ORDER — METRONIDAZOLE 500 MG PO TABS: 500.0000 mg | ORAL_TABLET | Freq: Two times a day (BID) | ORAL | Status: AC

## 2015-02-09 MED ORDER — AZITHROMYCIN 250 MG PO TABS
ORAL_TABLET | ORAL | Status: AC
  Administered 2015-02-09: 1000 mg via ORAL
  Filled 2015-02-09: qty 4

## 2015-02-09 MED ORDER — METRONIDAZOLE 250 MG PO TABS
ORAL_TABLET | ORAL | Status: AC
Start: 2015-02-09 — End: 2015-02-09
  Administered 2015-02-09: 500 mg via ORAL
  Filled 2015-02-09: qty 2

## 2015-02-09 MED ORDER — CEFTRIAXONE SODIUM 250 MG IJ SOLR
INTRAMUSCULAR | Status: AC
  Administered 2015-02-09: 250 mg via INTRAMUSCULAR
  Filled 2015-02-09: qty 250

## 2015-02-09 MED ORDER — ONDANSETRON 8 MG PO TBDP
8.0000 mg | ORAL_TABLET | Freq: Once | ORAL | Status: AC
  Administered 2015-02-09: 8 mg via ORAL

## 2015-02-09 MED ORDER — ONDANSETRON 8 MG PO TBDP
ORAL_TABLET | ORAL | Status: AC
  Administered 2015-02-09: 8 mg via ORAL
  Filled 2015-02-09: qty 1

## 2015-02-09 MED ORDER — METRONIDAZOLE 250 MG PO TABS
500.0000 mg | ORAL_TABLET | Freq: Once | ORAL | Status: AC
  Administered 2015-02-09: 500 mg via ORAL

## 2015-02-09 MED ORDER — ONDANSETRON HCL 4 MG PO TABS: 4.0000 mg | ORAL_TABLET | Freq: Every day | ORAL | Status: DC | PRN

## 2015-02-09 MED ORDER — CEFTRIAXONE SODIUM 250 MG IJ SOLR
250.0000 mg | Freq: Once | INTRAMUSCULAR | Status: AC
  Administered 2015-02-09: 250 mg via INTRAMUSCULAR

## 2015-02-09 MED ORDER — AZITHROMYCIN 250 MG PO TABS
1000.0000 mg | ORAL_TABLET | Freq: Once | ORAL | Status: AC
  Administered 2015-02-09: 1000 mg via ORAL

## 2015-02-09 NOTE — ED Notes (Signed)
Pt informed to return if life threatening symptoms occur.   

## 2015-02-09 NOTE — Discharge Instructions (Signed)
Sexually Transmitted Disease °A sexually transmitted disease (STD) is a disease or infection often passed to another person during sex. However, STDs can be passed through nonsexual ways. An STD can be passed through: °· Spit (saliva). °· Semen. °· Blood. °· Mucus from the vagina. °· Pee (urine). °HOW CAN I LESSEN MY CHANCES OF GETTING AN STD? °· Use: °· Latex condoms. °· Water-soluble lubricants with condoms. Do not use petroleum jelly or oils. °· Dental dams. These are small pieces of latex that are used as a barrier during oral sex. °· Avoid having more than one sex partner. °· Do not have sex with someone who has other sex partners. °· Do not have sex with anyone you do not know or who is at high risk for an STD. °· Avoid risky sex that can break your skin. °· Do not have sex if you have open sores on your mouth or skin. °· Avoid drinking too much alcohol or taking illegal drugs. Alcohol and drugs can affect your good judgment. °· Avoid oral and anal sex acts. °· Get shots (vaccines) for HPV and hepatitis. °· If you are at risk of being infected with HIV, it is advised that you take a certain medicine daily to prevent HIV infection. This is called pre-exposure prophylaxis (PrEP). You may be at risk if: °· You are a man who has sex with other men (MSM). °· You are attracted to the opposite sex (heterosexual) and are having sex with more than one partner. °· You take drugs with a needle. °· You have sex with someone who has HIV. °· Talk with your doctor about if you are at high risk of being infected with HIV. If you begin to take PrEP, get tested for HIV first. Get tested every 3 months for as long as you are taking PrEP. °WHAT SHOULD I DO IF I THINK I HAVE AN STD? °· See your doctor. °· Tell your sex partner(s) that you have an STD. They should be tested and treated. °· Do not have sex until your doctor says it is okay. °WHEN SHOULD I GET HELP? °Get help right away if: °· You have bad belly (abdominal)  pain. °· You are a man and have puffiness (swelling) or pain in your testicles. °· You are a woman and have puffiness in your vagina. °Document Released: 10/27/2004 Document Revised: 09/24/2013 Document Reviewed: 03/15/2013 °ExitCare® Patient Information ©2015 [employer]. This information is not intended to replace advice given to you by your health care provider. Make sure you discuss any questions you have with your health care provider. ° °Trichomoniasis °Trichomoniasis is an infection caused by an organism called Trichomonas. The infection can affect both women and men. In women, the outer female genitalia and the vagina are affected. In men, the penis is mainly affected, but the prostate and other reproductive organs can also be involved. Trichomoniasis is a sexually transmitted infection (STI) and is most often passed to another person through sexual contact.  °RISK FACTORS °· Having unprotected sexual intercourse. °· Having sexual intercourse with an infected partner. °SIGNS AND SYMPTOMS  °Symptoms of trichomoniasis in women include: °· Abnormal gray-green frothy vaginal discharge. °· Itching and irritation of the vagina. °· Itching and irritation of the area outside the vagina. °Symptoms of trichomoniasis in men include:  °· Penile discharge with or without pain. °· Pain during urination. This results from inflammation of the urethra. °DIAGNOSIS  °Trichomoniasis may be found during a Pap test or physical exam. Your   health care provider may use one of the following methods to help diagnose this infection: °· Examining vaginal discharge under a microscope. For men, urethral discharge would be examined. °· Testing the pH of the vagina with a test tape. °· Using a vaginal swab test that checks for the Trichomonas organism. A test is available that provides results within a few minutes. °· Doing a culture test for the organism. This is not usually needed. °TREATMENT  °· You may be given medicine to fight the  infection. Women should inform their health care provider if they could be or are pregnant. Some medicines used to treat the infection should not be taken during pregnancy. °· Your health care provider may recommend over-the-counter medicines or creams to decrease itching or irritation. °· Your sexual partner will need to be treated if infected. °HOME CARE INSTRUCTIONS  °· Take medicines only as directed by your health care provider. °· Take over-the-counter medicine for itching or irritation as directed by your health care provider. °· Do not have sexual intercourse while you have the infection. °· Women should not douche or wear tampons while they have the infection. °· Discuss your infection with your partner. Your partner may have gotten the infection from you, or you may have gotten it from your partner. °· Have your sex partner get examined and treated if necessary. °· Practice safe, informed, and protected sex. °· See your health care provider for other STI testing. °SEEK MEDICAL CARE IF:  °· You still have symptoms after you finish your medicine. °· You develop abdominal pain. °· You have pain when you urinate. °· You have bleeding after sexual intercourse. °· You develop a rash. °· Your medicine makes you sick or makes you throw up (vomit). °MAKE SURE YOU: °· Understand these instructions. °· Will watch your condition. °· Will get help right away if you are not doing well or get worse. °Document Released: 03/15/2001 Document Revised: 02/03/2014 Document Reviewed: 07/01/2013 °ExitCare® Patient Information ©2015 [employer]. This information is not intended to replace advice given to you by your health care provider. Make sure you discuss any questions you have with your health care provider. ° °

## 2015-02-09 NOTE — ED Provider Notes (Signed)
[facility] Emergency Department Provider Note  ____________________________________________  Time seen: Approximately 2030  I have reviewed the triage vital signs and the nursing notes.   HISTORY  Chief Complaint Vaginal Discharge    HPI [name] [name] is a 24 y.o. female complaint of vaginal discharge and vaginal pain and vaginal swelling says she is unsure of the exact etiology and is here for evaluation started couple days ago rates the pain between 5-10 out of 10 no fevers or nausea no pain or burning with urination no bowel complaints   Past Medical History  Diagnosis Date  . Chlamydia 10-2011  . Infection   . Headache(784.0)   . Heart palpitations     Patient Active Problem List   Diagnosis Date Noted  . Pregnancy 12/14/2013  . Late prenatal care complicating pregnancy in third trimester 11/25/2013    Past Surgical History  Procedure Laterality Date  . No past surgeries      Current Outpatient Rx  Name  Route  Sig  Dispense  Refill  . ibuprofen (ADVIL,MOTRIN) 600 MG tablet   Oral   Take 1 tablet (600 mg total) by mouth every 6 (six) hours as needed.   30 tablet   0   . metroNIDAZOLE (FLAGYL) 500 MG tablet   Oral   Take 1 tablet (500 mg total) by mouth 2 (two) times daily.   20 tablet   0   . ondansetron (ZOFRAN) 4 MG tablet   Oral   Take 1 tablet (4 mg total) by mouth daily as needed for nausea or vomiting.   15 tablet   1     Allergies Metronidazole  Family History  Problem Relation Age of Onset  . Anesthesia problems Neg Hx   . Other Neg Hx   . Diabetes Maternal Grandmother   . Hypertension Mother     Social History History  Substance Use Topics  . Smoking status: Light Tobacco Smoker -- 0.25 packs/day for 3 years    Types: Cigarettes  . Smokeless tobacco: Never Used  . Alcohol Use: No    Review of Systems Constitutional: No fever/chills Eyes: No visual changes. ENT: No sore throat. Cardiovascular:  Denies chest pain. Respiratory: Denies shortness of breath. Gastrointestinal: No abdominal pain.  No nausea, no vomiting.  No diarrhea.  No constipation. Genitourinary: Negative for dysuria. Musculoskeletal: Negative for back pain. Skin: Negative for rash. Neurological: Negative for headaches, focal weakness or numbness.  10-point ROS otherwise negative.  ____________________________________________   PHYSICAL EXAM:  VITAL SIGNS: ED Triage Vitals  Enc Vitals Group     BP 02/09/15 1936 136/76 mmHg     Pulse Rate 02/09/15 1936 99     Resp 02/09/15 1936 18     Temp 02/09/15 1936 98.3 F (36.8 C)     Temp Source 02/09/15 1936 Oral     SpO2 02/09/15 1936 100 %     Weight 02/09/15 1936 145 lb (65.772 kg)     Height 02/09/15 1936 5\' 6"  (1.676 m)     Head Cir --      Peak Flow --      Pain Score 02/09/15 1937 9     Pain Loc --      Pain Edu? --      Excl. in GC? --     Constitutional: Alert and oriented. Well appearing and in no acute distress. Eyes: Conjunctivae are normal. PERRL. EOMI. Head: Atraumatic. Nose: No congestion/rhinnorhea. Mouth/Throat: Mucous membranes are moist.  Oropharynx  non-erythematous. Neck: No stridor.   Cardiovascular: Normal rate, regular rhythm. Grossly normal heart sounds.  Good peripheral circulation. Respiratory: Normal respiratory effort.  No retractions. Lungs CTAB. G/U: Bimanual exam was normal patient had large amount of yellow gray vaginal discharge sternal anatomy was unremarkable Gastrointestinal: Soft and nontender. No distention. No abdominal bruits. No CVA tenderness. Musculoskeletal: No lower extremity tenderness nor edema.  No joint effusions. Neurologic:  Normal speech and language. No gross focal neurologic deficits are appreciated. Speech is normal. No gait instability. Skin:  Skin is warm, dry and intact. No rash noted. Psychiatric: Mood and affect are normal. Speech and behavior are  normal.  ____________________________________________   LABS (all labs ordered are listed, but only abnormal results are displayed)  Labs Reviewed  WET PREP, GENITAL - Abnormal; Notable for the following:    Trich, Wet Prep MANY (*)    WBC, Wet Prep HPF POC FEW (*)    All other components within normal limits  CHLAMYDIA/NGC RT PCR ([facility])   URINALYSIS COMPLETEWITH MICROSCOPIC ([facility])   POC URINE PREG, ED  POCT PREGNANCY, URINE   ____________________________________________    PROCEDURES  Procedure(s) performed: None  Critical Care performed: No  ____________________________________________   INITIAL IMPRESSION / ASSESSMENT AND PLAN / ED COURSE  Pertinent labs & imaging results that were available during my care of the patient were reviewed by me and considered in my medical decision making (see chart for details).  Disposition on this patient is discharged Trichomonas [facility] well as Trichomonas is to follow-up with health department for further tests and given prescription for Flagyl as of note her allergy to Flagyl was nausea we've advised her she needs to go ahead and take that for coverage ____________________________________________   FINAL CLINICAL IMPRESSION(S) / ED DIAGNOSES  Final diagnoses:  STD (female)  Trichimoniasis     Eulalie Speights Rosalyn GessWilliam C Arian Murley, PA-C 02/09/15 2224  Sharyn CreamerMark Quale, MD 02/14/15 [id number]

## 2015-02-09 NOTE — ED Notes (Signed)
Pt states she has some lumps, scratches,and discharge from her vagina. Pt states she noticed symptoms on Sunday

## 2015-03-15 ENCOUNTER — Encounter (HOSPITAL_COMMUNITY): Payer: Self-pay | Admitting: Emergency Medicine

## 2015-03-15 ENCOUNTER — Emergency Department (HOSPITAL_COMMUNITY)
Admission: EM | Admit: 2015-03-15 | Discharge: 2015-03-15 | Disposition: A | Discharge status: Home / Self Care | Payer: Medicaid Other | Attending: Emergency Medicine | Admitting: Emergency Medicine

## 2015-03-15 DIAGNOSIS — Y999 Unspecified external cause status: Secondary | ICD-10-CM | POA: Insufficient documentation

## 2015-03-15 DIAGNOSIS — S30861A Insect bite (nonvenomous) of abdominal wall, initial encounter: Secondary | ICD-10-CM | POA: Insufficient documentation

## 2015-03-15 DIAGNOSIS — S30851A Superficial foreign body of abdominal wall, initial encounter: Secondary | ICD-10-CM | POA: Diagnosis not present

## 2015-03-15 DIAGNOSIS — Y929 Unspecified place or not applicable: Secondary | ICD-10-CM | POA: Insufficient documentation

## 2015-03-15 DIAGNOSIS — Y939 Activity, unspecified: Secondary | ICD-10-CM | POA: Insufficient documentation

## 2015-03-15 DIAGNOSIS — Z720 Tobacco use: Secondary | ICD-10-CM | POA: Diagnosis not present

## 2015-03-15 DIAGNOSIS — X58XXXA Exposure to other specified factors, initial encounter: Secondary | ICD-10-CM | POA: Diagnosis not present

## 2015-03-15 DIAGNOSIS — W57XXXA Bitten or stung by nonvenomous insect and other nonvenomous arthropods, initial encounter: Secondary | ICD-10-CM

## 2015-03-15 DIAGNOSIS — Z8619 Personal history of other infectious and parasitic diseases: Secondary | ICD-10-CM | POA: Insufficient documentation

## 2015-03-15 NOTE — ED Provider Notes (Signed)
CSN: [id number]     Arrival date & time 03/15/15  1214 History  This chart was scribed for Marlon Pel, PA-C, working with Donnetta Hutching, MD by Chestine Spore, ED Scribe. The patient was seen in room TR05C/TR05C at 1:45 PM.    Chief Complaint  Patient presents with  . Insect Bite      The history is provided by the patient. No language interpreter was used.     HPI Comments: [name] [name] is a 24 y.o. female who presents to the Emergency Department complaining of insect bite onset this morning. She thinks that she has a bug stuck on her lower abdomen.. She reports that she pulled the body out this morning. She reports that when she took the bug out there was no head on the body. She states that she has tried removing the bug with mild relief for her symptoms. She denies fevers, chills, abdominal, vomiting and diarrhea and any other symptoms. She denies having rash t her stomach, she is afraid that the head of the bug is in her stomach.  Past Medical History  Diagnosis Date  . Chlamydia 10-2011  . Infection   . Headache(784.0)   . Heart palpitations    Past Surgical History  Procedure Laterality Date  . No past surgeries     Family History  Problem Relation Age of Onset  . Anesthesia problems Neg Hx   . Other Neg Hx   . Diabetes Maternal Grandmother   . Hypertension Mother    History  Substance Use Topics  . Smoking status: Light Tobacco Smoker -- 0.25 packs/day for 3 years    Types: Cigarettes  . Smokeless tobacco: Never Used  . Alcohol Use: No   OB History    Gravida Para Term Preterm AB TAB SAB Ectopic Multiple Living   0 0 0 0 0 0 2     Review of Systems  Constitutional: Negative for fever and chills.  Skin: Negative for color change.       Black spot noted on suprapubic abdomen      Allergies  Metronidazole  Home Medications   Prior to Admission medications   Medication Sig Start Date End Date Taking? Authorizing Provider  ibuprofen (ADVIL,MOTRIN) 600  MG tablet Take 1 tablet (600 mg total) by mouth every 6 (six) hours as needed. 05/29/14   Earley Favor, NP  ondansetron (ZOFRAN) 4 MG tablet Take 1 tablet (4 mg total) by mouth daily as needed for nausea or vomiting. 02/09/15 02/09/16  III William C Ruffian, PA-C   BP 143/78 mmHg  Pulse 70  Temp(Src) 98.6 F (37 C) (Oral)  Resp 16  Ht  (1.676 m)  Wt 138 lb (62.596 kg)  BMI 22.28 kg/m2  SpO2 99%  LMP 03/08/2015 Physical Exam  Constitutional: She is oriented to person, place, and time. She appears well-developed and well-nourished. No distress.  HENT:  Head: Normocephalic and atraumatic.  Eyes: EOM are normal.  Neck: Neck supple. No tracheal deviation present.  Cardiovascular: Normal rate.   Pulmonary/Chest: Effort normal. No respiratory distress.  Abdominal:  Small pinpoint foreign body to abdominal wall in the mid suprapubic region. No tenderness and no induration or bleeding associated. No abdominal pain  Musculoskeletal: Normal range of motion.  Neurological: She is alert and oriented to person, place, and time.  Skin: Skin is warm and dry.  Psychiatric: She has a normal mood and affect. Her behavior is normal.  Nursing note and vitals reviewed.  ED Course  FOREIGN BODY REMOVAL Date/Time: 03/15/2015 2:41 PM Performed by: Marlon Pel Authorized by: Marlon Pel Consent given by: patient Patient sedated: no Patient restrained: no Complexity: simple 1 objects recovered. Objects recovered: bug head Post-procedure assessment: foreign body removed Patient tolerance: Patient tolerated the procedure well with no immediate complications Comments: No associated infection   (including critical care time) DIAGNOSTIC STUDIES: Oxygen Saturation is 100% on RA, nl by my interpretation.    COORDINATION OF CARE: 1:47 PM-Discussed treatment plan which includes removal of foreign body with pt at bedside and pt agreed to plan.   Labs Review Labs Reviewed - No data to  display  Imaging Review No results found.   EKG Interpretation None      MDM   Final diagnoses:  Insect bite    Head of bug removed, no signs of infection. Pt given s/sx to warrant return visit to ED. Especially, rash, fevers, myalgias, N/V/D.   Medications - No data to display  24 y.o.Yardley Vastine's evaluation in the Emergency Department is complete. It has been determined that no acute conditions requiring further emergency intervention are present at this time. The patient/guardian have been advised of the diagnosis and plan. We have discussed signs and symptoms that warrant return to the ED, such as changes or worsening in symptoms.  Vital signs are stable at discharge. Filed Vitals:   03/15/15 1404  BP: 143/78  Pulse: 70  Temp: 98.6 F (37 C)  Resp: 16    Patient/guardian has voiced understanding and agreed to follow-up with the PCP or specialist.   I personally performed the services described in this documentation, which was scribed in my presence. The recorded information has been reviewed and is accurate.    Marlon Pel, PA-C 03/15/15 1442  Donnetta Hutching, MD 03/15/15 [phone]

## 2015-03-15 NOTE — ED Notes (Signed)
Pt. Stated, I had a bug in my stomach and I pull  Part of a bug out of my stomach skin.

## 2015-03-15 NOTE — Discharge Instructions (Signed)
Tick Bite Information Ticks are insects that attach themselves to the skin and draw blood for food. There are various types of ticks. Common types include wood ticks and deer ticks. Most ticks live in shrubs and grassy areas. Ticks can climb onto your body when you make contact with leaves or grass where the tick is waiting. The most common places on the body for ticks to attach themselves are the scalp, neck, armpits, waist, and groin. Most tick bites are harmless, but sometimes ticks carry germs that cause diseases. These germs can be spread to a person during the tick's feeding process. The chance of a disease spreading through a tick bite depends on:   The type of tick.  Time of year.   How long the tick is attached.   Geographic location.  HOW CAN YOU PREVENT TICK BITES? Take these steps to help prevent tick bites when you are outdoors:  Wear protective clothing. Long sleeves and long pants are best.   Wear white clothes so you can see ticks more easily.  Tuck your pant legs into your socks.   If walking on a trail, stay in the middle of the trail to avoid brushing against bushes.  Avoid walking through areas with long grass.  Put insect repellent on all exposed skin and along boot tops, pant legs, and sleeve cuffs.   Check clothing, hair, and skin repeatedly and before going inside.   Brush off any ticks that are not attached.  Take a shower or bath as soon as possible after being outdoors.  WHAT IS THE PROPER WAY TO REMOVE A TICK? Ticks should be removed as soon as possible to help prevent diseases caused by tick bites. 1. If latex gloves are available, put them on before trying to remove a tick.  2. Using fine-point tweezers, grasp the tick as close to the skin as possible. You may also use curved forceps or a tick removal tool. Grasp the tick as close to its head as possible. Avoid grasping the tick on its body. 3. Pull gently with steady upward pressure until  the tick lets go. Do not twist the tick or jerk it suddenly. This may break off the tick's head or mouth parts. 4. Do not squeeze or crush the tick's body. This could force disease-carrying fluids from the tick into your body.  5. After the tick is removed, wash the bite area and your hands with soap and water or other disinfectant such as alcohol. 6. Apply a small amount of antiseptic cream or ointment to the bite site.  7. Wash and disinfect any instruments that were used.  Do not try to remove a tick by applying a hot match, petroleum jelly, or fingernail polish to the tick. These methods do not work and may increase the chances of disease being spread from the tick bite.  WHEN SHOULD YOU SEEK MEDICAL CARE? Contact your health care provider if you are unable to remove a tick from your skin or if a part of the tick breaks off and is stuck in the skin.  After a tick bite, you need to be aware of signs and symptoms that could be related to diseases spread by ticks. Contact your health care provider if you develop any of the following in the days or weeks after the tick bite:  Unexplained fever.  Rash. A circular rash that appears days or weeks after the tick bite may indicate the possibility of Lyme disease. The rash may resemble   a target with a bull's-eye and may occur at a different part of your body than the tick bite.  Redness and swelling in the area of the tick bite.   Tender, swollen lymph glands.   Diarrhea.   Weight loss.   Cough.   Fatigue.   Muscle, joint, or bone pain.   Abdominal pain.   Headache.   Lethargy or a change in your level of consciousness.  Difficulty walking or moving your legs.   Numbness in the legs.   Paralysis.  Shortness of breath.   Confusion.   Repeated vomiting.  Document Released: 09/16/2000 Document Revised: 07/10/2013 Document Reviewed: 02/27/2013 [employer] Patient Information 2015 [employer]. This information is  not intended to replace advice given to you by your health care provider. Make sure you discuss any questions you have with your health care provider.  

## 2015-03-15 NOTE — ED Notes (Signed)
Declined W/C at D/C and was escorted to lobby by RN. 

## 2015-07-02 ENCOUNTER — Emergency Department (HOSPITAL_COMMUNITY): Payer: Medicaid Other

## 2015-07-02 ENCOUNTER — Emergency Department (HOSPITAL_COMMUNITY)
Admission: EM | Admit: 2015-07-02 | Discharge: 2015-07-02 | Disposition: A | Discharge status: Home / Self Care | Payer: Medicaid Other | Attending: Emergency Medicine | Admitting: Emergency Medicine

## 2015-07-02 ENCOUNTER — Encounter (HOSPITAL_COMMUNITY): Payer: Self-pay | Admitting: Emergency Medicine

## 2015-07-02 DIAGNOSIS — Z3202 Encounter for pregnancy test, result negative: Secondary | ICD-10-CM | POA: Diagnosis not present

## 2015-07-02 DIAGNOSIS — Z720 Tobacco use: Secondary | ICD-10-CM | POA: Insufficient documentation

## 2015-07-02 DIAGNOSIS — R1011 Right upper quadrant pain: Secondary | ICD-10-CM | POA: Diagnosis present

## 2015-07-02 DIAGNOSIS — Z8619 Personal history of other infectious and parasitic diseases: Secondary | ICD-10-CM | POA: Insufficient documentation

## 2015-07-02 DIAGNOSIS — R0682 Tachypnea, not elsewhere classified: Secondary | ICD-10-CM | POA: Diagnosis not present

## 2015-07-02 DIAGNOSIS — K8020 Calculus of gallbladder without cholecystitis without obstruction: Secondary | ICD-10-CM | POA: Diagnosis not present

## 2015-07-02 DIAGNOSIS — F419 Anxiety disorder, unspecified: Secondary | ICD-10-CM | POA: Diagnosis not present

## 2015-07-02 LAB — I-STAT BETA HCG BLOOD, ED (MC, WL, AP ONLY): I-stat hCG, quantitative: <5 m[IU]/mL (ref <5)

## 2015-07-02 LAB — CBC WITH DIFFERENTIAL/PLATELET
BASOS ABS: 0 10*3/uL (ref 0.0–0.1)
BASOS PCT: 0 %
EOS ABS: 0.2 10*3/uL (ref 0.0–0.7)
Eosinophils Relative: 3 %
HCT: 35.6 % — ABNORMAL LOW (ref 36.0–46.0)
HEMOGLOBIN: 11.4 g/dL — AB (ref 12.0–15.0)
Lymphocytes Relative: 26 %
Lymphs Abs: 2.1 10*3/uL (ref 0.7–4.0)
MCH: 22.8 pg — ABNORMAL LOW (ref 26.0–34.0)
MCHC: 32 g/dL (ref 30.0–36.0)
MCV: 71.3 fL — ABNORMAL LOW (ref 78.0–100.0)
MONO ABS: 0.5 10*3/uL (ref 0.1–1.0)
MONOS PCT: 6 %
NEUTROS PCT: 65 %
Neutro Abs: 5.2 10*3/uL (ref 1.7–7.7)
Platelets: 331 10*3/uL (ref 150–400)
RBC: 4.99 MIL/uL (ref 3.87–5.11)
RDW: 15.2 % (ref 11.5–15.5)
WBC: 8 10*3/uL (ref 4.0–10.5)

## 2015-07-02 LAB — COMPREHENSIVE METABOLIC PANEL
ALT: 26 U/L (ref 14–54)
ANION GAP: 7 (ref 5–15)
AST: 68 U/L — AB (ref 15–41)
Albumin: 3.6 g/dL (ref 3.5–5.0)
Alkaline Phosphatase: 40 U/L (ref 38–126)
BUN: 9 mg/dL (ref 6–20)
CO2: 22 mmol/L (ref 22–32)
Calcium: 8.6 mg/dL — ABNORMAL LOW (ref 8.9–10.3)
Chloride: 109 mmol/L (ref 101–111)
Creatinine, Ser: 0.54 mg/dL (ref 0.44–1.00)
GFR calc Af Amer: >60 mL/min (ref >60)
GFR calc non Af Amer: >60 mL/min (ref >60)
GLUCOSE: 93 mg/dL (ref 65–99)
POTASSIUM: 3.7 mmol/L (ref 3.5–5.1)
SODIUM: 138 mmol/L (ref 135–145)
Total Bilirubin: 0.2 mg/dL — ABNORMAL LOW (ref 0.3–1.2)
Total Protein: 7.3 g/dL (ref 6.5–8.1)

## 2015-07-02 LAB — LIPASE, BLOOD: Lipase: 24 U/L (ref 22–51)

## 2015-07-02 MED ORDER — MORPHINE SULFATE (PF) 4 MG/ML IV SOLN
4.0000 mg | Freq: Once | INTRAVENOUS | Status: AC
  Administered 2015-07-02: 4 mg via INTRAVENOUS
  Filled 2015-07-02: qty 1

## 2015-07-02 MED ORDER — LORAZEPAM 2 MG/ML IJ SOLN
1.0000 mg | Freq: Once | INTRAMUSCULAR | Status: AC
  Administered 2015-07-02: 1 mg via INTRAVENOUS
  Filled 2015-07-02: qty 1

## 2015-07-02 MED ORDER — ONDANSETRON HCL 4 MG/2ML IJ SOLN
4.0000 mg | Freq: Once | INTRAMUSCULAR | Status: AC
  Administered 2015-07-02: 4 mg via INTRAVENOUS
  Filled 2015-07-02: qty 2

## 2015-07-02 MED ORDER — ONDANSETRON HCL 4 MG PO TABS: 4.0000 mg | ORAL_TABLET | Freq: Four times a day (QID) | ORAL | Status: DC

## 2015-07-02 MED ORDER — HYDROCODONE-ACETAMINOPHEN 5-325 MG PO TABS: 1.0000 | ORAL_TABLET | Freq: Four times a day (QID) | ORAL | Status: DC | PRN

## 2015-07-02 NOTE — ED Notes (Signed)
US at bedside

## 2015-07-02 NOTE — Discharge Instructions (Signed)
Take pain medication as needed for severe pain.  Do not drive or operate heavy machinery for 4-6 hours after taking pain medication.   Emergency Department Resource Guide 1) Find a Doctor and Pay Out of Pocket Although you won't have to find out who is covered by your insurance plan, it is a good idea to ask around and get recommendations. You will then need to call the office and see if the doctor you have chosen will accept you as a new patient and what types of options they offer for patients who are self-pay. Some doctors offer discounts or will set up payment plans for their patients who do not have insurance, but you will need to ask so you aren't surprised when you get to your appointment.  2) Contact Your Local Health Department Not all health departments have doctors that can see patients for sick visits, but many do, so it is worth a call to see if yours does. If you don't know where your local health department is, you can check in your phone book. The CDC also has a tool to help you locate your state's health department, and many state websites also have listings of all of their local health departments.  3) Find a Walk-in Clinic If your illness is not likely to be very severe or complicated, you may want to try a walk in clinic. These are popping up all over the country in pharmacies, drugstores, and shopping centers. They're usually staffed by nurse practitioners or physician assistants that have been trained to treat common illnesses and complaints. They're usually fairly quick and inexpensive. However, if you have serious medical issues or chronic medical problems, these are probably not your best option.  No Primary Care Doctor: - Call Health Connect at  [phone] - they can help you locate a primary care doctor that  accepts your insurance, provides certain services, etc. - Physician Referral Service- [phone]  Chronic Pain Problems: Organization         Address  Phone    Notes  [facility]  [phone] Patients need to be referred by their primary care doctor.   Medication Assistance: Organization         Address  Phone   Notes  [facility] Medication [facility] [address] [place], [place] [address] [phone] --Must be a resident of [facility] -- Must have NO insurance coverage whatsoever (no Medicaid/ Medicare, etc.) -- The pt. MUST have a primary care doctor that directs their care regularly and follows them in the community   [facility]  [phone]   [employer]  [phone]    Agencies that provide inexpensive medical care: Organization         Address  Phone   Notes  [facility]  [phone]   [facility]    [phone]   [facility] [address] [place], [place] [address] [phone]   [facility] [address] [place] [place] [phone]   Planned Parenthood    [phone]   [facility]    [phone]   [facility] and [facility]  [address], [place] Phone:  [phone], Fax:  [phone] Hours of Operation:  9 am - 6 pm, M-F.  Also accepts Medicaid/Medicare and self-pay.  [facility]  [address], [employer] Phone: [phone])  [phone], Fax: [phone]. Hours of Operation:  8:30 am - 5:30 pm, M-F.  Also accepts Medicaid and self-pay.  [facility] [place] [address] [place] Phone: [phone]   [place], [place], [place] [phone], Ext. [phone] Mondays & Thursdays: 7-9 AM.  First 15 patients are seen on a first come, first serve basis.    [place] Providers:  Organization         Address  Phone   Notes  [facility] [address], [place] [phone] Also accepts self-pay patients.  [facility]  [id number] [place], [place]  [phone]   [place], [address], [place] [phone]   [facility] [address] [place] [phone]   Lucianne Lei [address], [place]   [phone] Only accepts [place] Access [place] patients after they have their name applied to their card.   Self-Pay (no insurance) in [facility]:  Organization         Address  Phone   Notes  Sickle Cell Patients, [facility] Internal [facility] [place] [phone]   [facility] Urgent Care [place] [phone]   [facility] [place]  [place], [place], [place] [phone]   [facility]/Dr. Osei-Bonsu  [address], [place] or [address], [place] [phone] Phone number for both Dent and [place] locations is the same.  Urgent Medical and [facility] [address], [place] [phone]   [facility] [address] [place] or [address] [phone]   [facility] [address] [place] [phone], phone; [phone], fax Sees patients 1st and 3rd Saturday of every month.  Must not qualify for public or private insurance (i.e. Medicaid, Medicare, [place] Health Choice, Veterans' Benefits)  Household income should be no more than 200% of the poverty level The clinic cannot treat you if you are pregnant or think you are pregnant  Sexually transmitted diseases are not treated at the clinic.    Dental Care: Organization         Address  Phone  Notes  [facility] Department of [facility] [place] [phone] Accepts children up to age 49 who are enrolled in [place] or [place]; pregnant women with a Medicaid card; and children who have  applied for Medicaid or [place] Health Choice, but were declined, whose parents can pay a reduced fee at time of service.  [facility] Department of [facility]  [address], [place] [phone] Accepts children up to age 43 who are enrolled in [place] or [place]; pregnant women with a Medicaid card; and children who have applied for Medicaid or [place] Health Choice, but were declined, whose parents can pay a reduced fee at time of service.  [facility] PRO[facility]  [place] [phone] Patients are seen by appointment only. Walk-ins are not accepted. [place] will see patients 75 years of age and older. Monday - Tuesday (8am-5pm) Most Wednesdays (8:30-5pm) $30 per visit, cash only  [facility]  [address], [employer]  [place] [phone] Patients are seen by appointment only. Walk-ins are not accepted. [place] will see patients 52 years of age and older. One Wednesday Evening (Monthly: Volunteer Based).  $30 per visit, cash only  [place]  [phone] for adults; Children under age 58, call Graduate Pediatric Dentistry at [phone]. Children aged 77-14, please call [phone] to request a pediatric application.  Dental services are provided in all areas of dental care including fillings, crowns and bridges, complete and partial dentures, implants, gum treatment, root canals, and extractions. Preventive care is also provided. Treatment is provided to both adults and children. Patients are selected via a lottery and there is often a waiting list.   [facility] [address], [place]  [phone] [email or web address]   [facility] [address] [place], [place] [phone], Ext. [phone] Second and Fourth Thursday of each month, opens at 6:30 AM; Clinic ends at 9 AM.  Patients are seen on a first-come first-served basis, and a  limited number are seen during each clinic.   [facility]  [address] Hillard Danker [place], [place] [phone]   Eligibility Requirements You must have lived in [place], [place], or [place] counties for at least the last three months.   You cannot be eligible for state or federal sponsored [employer], including [employer], [place], or [employer].   You generally cannot be eligible for healthcare insurance through your employer.    How to apply: Eligibility screenings are held every Tuesday and Wednesday afternoon from 1:00 pm until 4:00 pm. You do not need an appointment for the interview!  [facility] [address], [place], [place]   [place]  [place] Department  [place]  [phone]    Behavioral Health Resources in the Community: Intensive Outpatient Programs Organization         Address  Phone  Notes  [place]. [address], [place], [place] [phone]   [facility] [address] [place], [place]   ADS: [facility] [address] [place], [place]   [place] [address]  [place], [place] or [phone]   Substance Abuse Resources Organization         Address  Phone  Notes  Alcohol and Drug Services  [phone]   [place]  [phone]   [facility] [place]   Chinita Pester  [phone]   Residential & Outpatient Substance Abuse Program  [phone]   Psychological Services Organization         Address  Phone  Notes  [facility] [place]  [place]  [phone]   [place] [address] [place] or [phone]    Mobile Crisis Teams Organization          Address  Phone  Notes  [facility], Mobile Crisis Care Unit  [phone]   [facility]  [address] [place], [place]   [place] [address] [place] [phone]    Self-Help/Support Groups Organization         Address  Phone             Notes  [place]. of [place] - variety of support groups  336- H3156881 Call for more information  Narcotics Anonymous (NA), [facility] [address], [employer] [place]  2 meetings at this location   Residential Facilities manager         Address  Phone  Notes  [facility] [place],    [place]  [phone]   [facility]  [address], [place] [id number], [place], [place]   [place], [place] [phone] Admissions: 8am-3pm M-F  Incentives Substance [place] [address]    [employer], [place] [phone]   [facility] [address] [place], [place], [place]   [facility] [address],  [place], [place]   [facility] - Intensive Outpatient [address] Kristeen [address], [place], [place]   [facility] ([place].,  [place], [place] [phone] or [phone]   [facility] ([facility]) [address], [place], [place] Accepts Medicaid  [facility] [address]  [place] [phone] Substance Abuse/Addiction Treatment   [facility] Organization         Address  Phone  Notes  [facility]  [phone]   Domenic Schwab, PhD [address] Arlis Porta [place], [place]   [phone] or [phone]   [place], [place] [phone]   [facility] [address], [place], [place] [phone] Insurance/Medicaid/sponsorship  through [facility] Families [address]                                    [place], [place] [phone] [place] [address], [place] [phone]    Dr. Adele Schilder  [phone]   [facility] Dept. 1) [address] [place] 2) [place] 3)  [place] [address], [place] [phone]  [phone]   [place] [phone] or [phone] (After Hours)

## 2015-07-02 NOTE — ED Notes (Signed)
PA at bedside.

## 2015-07-02 NOTE — ED Notes (Signed)
Bed: ZO10 Expected date:  Expected time:  Means of arrival:  Comments: 81f abd pain

## 2015-07-02 NOTE — Progress Notes (Addendum)
EDP in room with CM and pt when Cm explained the need to change her pcp Pt without hx of sickle cell disease Pt also mention hx of abdominal pain and anxiety Cm encouraged a good pcp to help her manage these concerns Pt nodded in agreement  1115 Pt pcp is listed as [facility] [address] [place] [address] [place], [place] [address] [phone] ED CM called the center to see if pt has been active Charlene confirms pt has not been seen at the center Cm entered in pt d/c instructions for her to contact the sickle cell center or to get DSS to remove this as her pcp and offered her a list of [place] [employer]

## 2015-07-02 NOTE — ED Provider Notes (Signed)
CSN: [id number]     Arrival date & time 07/02/15  4098 History   First MD Initiated Contact with Patient 07/02/15 [phone]     Chief Complaint  Patient presents with  . Abdominal Pain     (Consider location/radiation/quality/duration/timing/severity/associated sxs/prior Treatment) HPI Comments: Patient presents today with a chief complaint of abdominal pain.  Pain located in the epigastric and RUQ.  She states that the pain has been coming intermittently in spasms since 7:00 AM this morning.  She describes the pain as sharp.  Pain radiates to the right mid back and also to her chest.  She states that she has never had pain like this before.  She has not taken anything for pain prior to arrival.  No exacerbating or alleviating factors.  No association with eating or deep breaths.  She reports associated nausea, but denies vomiting.  Denies cough, fever, chills, hemoptysis, SOB, urinary symptoms, flank pain, vaginal bleeding, vaginal discharge, diarrhea, or constipation.  She reports last BM was yesterday and was normal.   LMP was approximately 2 weeks ago and was normal.  No known history of kidney stones or Gallstones.  Patient is a 24 y.o. female presenting with abdominal pain. The history is provided by the patient.  Abdominal Pain   Past Medical History  Diagnosis Date  . Chlamydia 10-2011  . Infection   . Headache(784.0)   . Heart palpitations    Past Surgical History  Procedure Laterality Date  . No past surgeries     Family History  Problem Relation Age of Onset  . Anesthesia problems Neg Hx   . Other Neg Hx   . Diabetes Maternal Grandmother   . Hypertension Mother    Social History  Substance Use Topics  . Smoking status: Light Tobacco Smoker -- 0.25 packs/day for 3 years    Types: Cigarettes  . Smokeless tobacco: Never Used  . Alcohol Use: No   OB History    Gravida Para Term Preterm AB TAB SAB Ectopic Multiple Living   0 0 0 0 0 0 2     Review of Systems   Gastrointestinal: Positive for abdominal pain.  All other systems reviewed and are negative.     Allergies  Metronidazole  Home Medications   Prior to Admission medications   Medication Sig Start Date End Date Taking? Authorizing Provider  ondansetron (ZOFRAN) 4 MG tablet Take 1 tablet (4 mg total) by mouth daily as needed for nausea or vomiting. Patient not taking: Reported on 07/02/2015 02/09/15 02/09/16  III William C Ruffian, PA-C   BP 138/79 mmHg  Pulse 82  Temp(Src) 97.5 F (36.4 C) (Oral)  Resp 30  SpO2 100% Physical Exam  Constitutional: She appears well-developed and well-nourished.  Patient appears very anxious  HENT:  Head: Normocephalic and atraumatic.  Mouth/Throat: Oropharynx is clear and moist.  Neck: Normal range of motion. Neck supple.  Cardiovascular: Normal rate, regular rhythm and normal heart sounds.   Pulmonary/Chest: Breath sounds normal. Tachypnea noted. She has no decreased breath sounds. She has no wheezes. She has no rhonchi. She has no rales.  Abdominal: Soft. Bowel sounds are normal. She exhibits no distension and no mass. There is tenderness in the right upper quadrant and epigastric area. There is positive Murphy's sign. There is no rebound, no guarding and no CVA tenderness.  Musculoskeletal: Normal range of motion.  Neurological: She is alert.  Skin: Skin is warm and dry.  Psychiatric: Her mood appears anxious.  Nursing note and vitals reviewed.   ED Course  Procedures (including critical care time) Labs Review Labs Reviewed  CBC WITH DIFFERENTIAL/PLATELET - Abnormal; Notable for the following:    Hemoglobin 11.4 (*)    HCT 35.6 (*)    MCV 71.3 (*)    MCH 22.8 (*)    All other components within normal limits  COMPREHENSIVE METABOLIC PANEL - Abnormal; Notable for the following:    Calcium 8.6 (*)    AST 68 (*)    Total Bilirubin 0.2 (*)    All other components within normal limits  LIPASE, BLOOD  I-STAT BETA HCG BLOOD, ED (MC, WL, AP  ONLY)    Imaging Review Dg Chest 2 View  07/02/2015   CLINICAL DATA:  Upper abdominal pain and anemia  EXAM: CHEST  2 VIEW  COMPARISON:  July 24, 2012  FINDINGS: There is no edema or consolidation. The heart size and pulmonary vascularity are normal. No adenopathy. No bone lesions. Trachea appears unremarkable.  IMPRESSION: No edema or consolidation.   Electronically Signed   By: Bretta Bang III M.D.   On: 07/02/2015 10:12   US Abdomen Limited Ruq  07/02/2015   CLINICAL DATA:  Acute right upper quadrant abdominal pain.  EXAM: US ABDOMEN LIMITED - RIGHT UPPER QUADRANT  COMPARISON:  None.  FINDINGS: Gallbladder:  Multiple small gallstones are noted without gallbladder wall thickening or pericholecystic fluid. No sonographic Murphy's sign is noted.  Common bile duct:  Diameter: 3.9 mm which is within normal limits.  Liver:  No focal lesion identified. Within normal limits in parenchymal echogenicity.  IMPRESSION: Cholelithiasis without evidence of cholecystitis. No other abnormality seen in the right upper quadrant of the abdomen.   Electronically Signed   By: Lupita Raider, M.D.   On: 07/02/2015 09:51   I have personally reviewed and evaluated these images and lab results as part of my medical decision-making.   EKG Interpretation None     9:52 AM Reassessed patient.  Patient is comfortable at this time.  Today's Vitals   07/02/15 0845 07/02/15 0850 07/02/15 0941 07/02/15 1136  BP: 138/79   121/65  Pulse: 82   72  Temp: 97.5 F (36.4 C)     TempSrc: Oral     Resp: 30   18  SpO2: 100%   100%  PainSc:  6  Asleep 10-Worst pain ever   MDM   Final diagnoses:  RUQ abdominal pain  RUQ abdominal pain   Patient presents today with a chief complaint of RUQ abdominal pain.  On exam, patient with tenderness to the RUQ, but no rebound or guarding.  Labs today unremarkable aside from a slightly elevated AST.  Pain and nausea controlled in the ED.  Ultrasound showing Cholelithiasis  without evidence of Cholecystitis.  Feel that the patient is stable for discharge.  Patient given referral to GI and CCS.   Return precautions given.    Santiago Glad, PA-C 07/03/15 9604  Mancel Bale, MD 07/06/15 [phone]

## 2015-07-02 NOTE — ED Notes (Signed)
Per EMS pt c/o sharp generalized abdominal pain onset 0700 today, no rebound tenderness, rigidity. Pain is tender on palpation and radiating to RUQ, pt breathing fast and heavy. Denies emesis, change in bowel movements, abnormal diet.

## 2015-07-24 ENCOUNTER — Emergency Department (HOSPITAL_COMMUNITY)
Admission: EM | Admit: 2015-07-24 | Discharge: 2015-07-24 | Disposition: A | Discharge status: Home / Self Care | Payer: Medicaid Other | Attending: Emergency Medicine | Admitting: Emergency Medicine

## 2015-07-24 ENCOUNTER — Encounter (HOSPITAL_COMMUNITY): Payer: Self-pay | Admitting: Emergency Medicine

## 2015-07-24 ENCOUNTER — Emergency Department (HOSPITAL_COMMUNITY): Payer: Medicaid Other

## 2015-07-24 DIAGNOSIS — K8020 Calculus of gallbladder without cholecystitis without obstruction: Secondary | ICD-10-CM | POA: Insufficient documentation

## 2015-07-24 DIAGNOSIS — Z8619 Personal history of other infectious and parasitic diseases: Secondary | ICD-10-CM | POA: Insufficient documentation

## 2015-07-24 DIAGNOSIS — Z79899 Other long term (current) drug therapy: Secondary | ICD-10-CM | POA: Insufficient documentation

## 2015-07-24 DIAGNOSIS — R740 Nonspecific elevation of levels of transaminase and lactic acid dehydrogenase [LDH]: Secondary | ICD-10-CM | POA: Insufficient documentation

## 2015-07-24 DIAGNOSIS — Z3202 Encounter for pregnancy test, result negative: Secondary | ICD-10-CM | POA: Insufficient documentation

## 2015-07-24 DIAGNOSIS — Z862 Personal history of diseases of the blood and blood-forming organs and certain disorders involving the immune mechanism: Secondary | ICD-10-CM | POA: Insufficient documentation

## 2015-07-24 DIAGNOSIS — Z720 Tobacco use: Secondary | ICD-10-CM | POA: Insufficient documentation

## 2015-07-24 DIAGNOSIS — R7401 Elevation of levels of liver transaminase levels: Secondary | ICD-10-CM

## 2015-07-24 DIAGNOSIS — R1010 Upper abdominal pain, unspecified: Secondary | ICD-10-CM | POA: Diagnosis present

## 2015-07-24 HISTORY — DX: Anemia, unspecified: D64.9

## 2015-07-24 LAB — CBC
HCT: 35.5 % — ABNORMAL LOW (ref 36.0–46.0)
Hemoglobin: 11.4 g/dL — ABNORMAL LOW (ref 12.0–15.0)
MCH: 23 pg — ABNORMAL LOW (ref 26.0–34.0)
MCHC: 32.1 g/dL (ref 30.0–36.0)
MCV: 71.7 fL — AB (ref 78.0–100.0)
PLATELETS: 377 10*3/uL (ref 150–400)
RBC: 4.95 MIL/uL (ref 3.87–5.11)
RDW: 14.9 % (ref 11.5–15.5)
WBC: 7.2 10*3/uL (ref 4.0–10.5)

## 2015-07-24 LAB — COMPREHENSIVE METABOLIC PANEL
ALBUMIN: 3.8 g/dL (ref 3.5–5.0)
ALK PHOS: 85 U/L (ref 38–126)
ALT: 332 U/L — AB (ref 14–54)
AST: 538 U/L — AB (ref 15–41)
Anion gap: 10 (ref 5–15)
BUN: <5 mg/dL — ABNORMAL LOW (ref 6–20)
CHLORIDE: 103 mmol/L (ref 101–111)
CO2: 26 mmol/L (ref 22–32)
CREATININE: 0.67 mg/dL (ref 0.44–1.00)
Calcium: 9.3 mg/dL (ref 8.9–10.3)
GFR calc non Af Amer: >60 mL/min (ref >60)
Glucose, Bld: 98 mg/dL (ref 65–99)
Potassium: 3.7 mmol/L (ref 3.5–5.1)
SODIUM: 139 mmol/L (ref 135–145)
Total Bilirubin: 1.5 mg/dL — ABNORMAL HIGH (ref 0.3–1.2)
Total Protein: 7.7 g/dL (ref 6.5–8.1)

## 2015-07-24 LAB — I-STAT BETA HCG BLOOD, ED (MC, WL, AP ONLY)

## 2015-07-24 LAB — URINALYSIS, ROUTINE W REFLEX MICROSCOPIC
Glucose, UA: NEGATIVE mg/dL
Hgb urine dipstick: NEGATIVE
KETONES UR: 15 mg/dL — AB
NITRITE: NEGATIVE
PH: 8.5 — AB (ref 5.0–8.0)
PROTEIN: NEGATIVE mg/dL
Specific Gravity, Urine: 1.019 (ref 1.005–1.030)
Urobilinogen, UA: 1 mg/dL (ref 0.0–1.0)

## 2015-07-24 LAB — URINE MICROSCOPIC-ADD ON

## 2015-07-24 LAB — LIPASE, BLOOD: LIPASE: 22 U/L (ref 11–51)

## 2015-07-24 MED ORDER — HYDROMORPHONE HCL 1 MG/ML IJ SOLN
1.0000 mg | Freq: Once | INTRAMUSCULAR | Status: AC
  Administered 2015-07-24: 1 mg via INTRAVENOUS
  Filled 2015-07-24: qty 1

## 2015-07-24 MED ORDER — HYDROCODONE-ACETAMINOPHEN 5-325 MG PO TABS: 1.0000 | ORAL_TABLET | Freq: Four times a day (QID) | ORAL | Status: DC | PRN

## 2015-07-24 MED ORDER — ONDANSETRON 4 MG PO TBDP
4.0000 mg | ORAL_TABLET | Freq: Once | ORAL | Status: AC | PRN
  Administered 2015-07-24: 4 mg via ORAL

## 2015-07-24 MED ORDER — ONDANSETRON 4 MG PO TBDP
4.0000 mg | ORAL_TABLET | Freq: Once | ORAL | Status: AC | PRN
  Administered 2015-07-24: 4 mg via ORAL
  Filled 2015-07-24: qty 1

## 2015-07-24 MED ORDER — SODIUM CHLORIDE 0.9 % IV BOLUS (SEPSIS)
1000.0000 mL | Freq: Once | INTRAVENOUS | Status: AC
  Administered 2015-07-24: 1000 mL via INTRAVENOUS

## 2015-07-24 MED ORDER — ONDANSETRON 8 MG PO TBDP: 8.0000 mg | ORAL_TABLET | Freq: Three times a day (TID) | ORAL | Status: DC | PRN

## 2015-07-24 MED ORDER — OXYCODONE-ACETAMINOPHEN 5-325 MG PO TABS
ORAL_TABLET | ORAL | Status: AC
  Filled 2015-07-24: qty 1

## 2015-07-24 MED ORDER — OXYCODONE-ACETAMINOPHEN 5-325 MG PO TABS
1.0000 | ORAL_TABLET | Freq: Once | ORAL | Status: AC
  Administered 2015-07-24: 1 via ORAL

## 2015-07-24 MED ORDER — LORAZEPAM 2 MG/ML IJ SOLN
1.0000 mg | Freq: Once | INTRAMUSCULAR | Status: AC
  Administered 2015-07-24: 1 mg via INTRAVENOUS
  Filled 2015-07-24: qty 1

## 2015-07-24 MED ORDER — ONDANSETRON 4 MG PO TBDP
ORAL_TABLET | ORAL | Status: AC
  Filled 2015-07-24: qty 1

## 2015-07-24 NOTE — ED Notes (Signed)
Per EMS: pt from home for eval of RUQ and LUQ abd pain, pt seen for same last week and referred to GI doctor for follow up, pt states she has not gone. Pt reports some nausea and tearful in triage. Denies any vaginal discharge, odor, or dysuria. EMS noted pt hyperventilating upon arrival and c/o chest tightness due to hyperventilating, pt with hx of anxiety and states she does not take anything. nad noted. Denies any sob.

## 2015-07-24 NOTE — Discharge Instructions (Signed)
Call the surgeon and the Gi doctor for the earliest available appointment. Return to the ER if the pain is unbearable and you are unable to keep any meds down.  DO NOT EAT FRIED FOODS OR FATTY FOODS.   Cholelithiasis Cholelithiasis (also called gallstones) is a form of gallbladder disease in which gallstones form in your gallbladder. The gallbladder is an organ that stores bile made in the liver, which helps digest fats. Gallstones begin as small crystals and slowly grow into stones. Gallstone pain occurs when the gallbladder spasms and a gallstone is blocking the duct. Pain can also occur when a stone passes out of the duct.  RISK FACTORS  Being female.   Having multiple pregnancies. Health care providers sometimes advise removing diseased gallbladders before future pregnancies.   Being obese.  Eating a diet heavy in fried foods and fat.   Being older than 60 years and increasing age.   Prolonged use of medicines containing female hormones.   Having diabetes mellitus.   Rapidly losing weight.   Having a family history of gallstones (heredity).  SYMPTOMS  Nausea.   Vomiting.  Abdominal pain.   Yellowing of the skin (jaundice).   Sudden pain. It may persist from several minutes to several hours.  Fever.   Tenderness to the touch. In some cases, when gallstones do not move into the bile duct, people have no pain or symptoms. These are called "silent" gallstones.  TREATMENT Silent gallstones do not need treatment. In severe cases, emergency surgery may be required. Options for treatment include:  Surgery to remove the gallbladder. This is the most common treatment.  Medicines. These do not always work and may take 6-12 months or more to work.  Shock wave treatment (extracorporeal biliary lithotripsy). In this treatment an ultrasound machine sends shock waves to the gallbladder to break gallstones into smaller pieces that can pass into the intestines or be  dissolved by medicine. HOME CARE INSTRUCTIONS   Only take over-the-counter or prescription medicines for pain, discomfort, or fever as directed by your health care provider.   Follow a low-fat diet until seen again by your health care provider. Fat causes the gallbladder to contract, which can result in pain.   Follow up with your health care provider as directed. Attacks are almost always recurrent and surgery is usually required for permanent treatment.  SEEK IMMEDIATE MEDICAL CARE IF:   Your pain increases and is not controlled by medicines.   You have a fever or persistent symptoms for more than 2-3 days.   You have a fever and your symptoms suddenly get worse.   You have persistent nausea and vomiting.  MAKE SURE YOU:   Understand these instructions.  Will watch your condition.  Will get help right away if you are not doing well or get worse.   This information is not intended to replace advice given to you by your health care provider. Make sure you discuss any questions you have with your health care provider.   Document Released: 09/15/2005 Document Revised: 05/22/2013 Document Reviewed: 03/13/2013 Elsevier Interactive Patient Education 2016 Elsevier Inc. Low-Fat Diet for Pancreatitis or Gallbladder Conditions A low-fat diet can be helpful if you have pancreatitis or a gallbladder condition. With these conditions, your pancreas and gallbladder have trouble digesting fats. A healthy eating plan with less fat will help rest your pancreas and gallbladder and reduce your symptoms. WHAT DO I NEED TO KNOW ABOUT THIS DIET?  Eat a low-fat diet.  Reduce your fat  intake to less than 20-30% of your total daily calories. This is less than 50-60 g of fat per day.  Remember that you need some fat in your diet. Ask your dietician what your daily goal should be.  Choose nonfat and low-fat healthy foods. Look for the words "nonfat," "low fat," or "fat free."  As a guide, look  on the label and choose foods with less than 3 g of fat per serving. Eat only one serving.  Avoid alcohol.  Do not smoke. If you need help quitting, talk with your health care provider.  Eat small frequent meals instead of three large heavy meals. WHAT FOODS CAN I EAT? Grains Include healthy grains and starches such as potatoes, wheat bread, fiber-rich cereal, and brown rice. Choose whole grain options whenever possible. In adults, whole grains should account for 45-65% of your daily calories.  Fruits and Vegetables Eat plenty of fruits and vegetables. Fresh fruits and vegetables add fiber to your diet. Meats and Other Protein Sources Eat lean meat such as chicken and pork. Trim any fat off of meat before cooking it. Eggs, fish, and beans are other sources of protein. In adults, these foods should account for 10-35% of your daily calories. Dairy Choose low-fat milk and dairy options. Dairy includes fat and protein, as well as calcium.  Fats and Oils Limit high-fat foods such as fried foods, sweets, baked goods, sugary drinks.  Other Creamy sauces and condiments, such as mayonnaise, can add extra fat. Think about whether or not you need to use them, or use smaller amounts or low fat options. WHAT FOODS ARE NOT RECOMMENDED?  High fat foods, such as:  [employer].  Ice cream.  [place] toast.  Sweet rolls.  Pizza.  Cheese bread.  Foods covered with batter, butter, creamy sauces, or cheese.  Fried foods.  Sugary drinks and desserts.  Foods that cause gas or bloating   This information is not intended to replace advice given to you by your health care provider. Make sure you discuss any questions you have with your health care provider.   Document Released: 09/24/2013 Document Reviewed: 09/24/2013 Elsevier Interactive Patient Education [employer]

## 2015-07-24 NOTE — ED Notes (Signed)
Pt vomited large amount of brown emesis; linens changed, gown changed

## 2015-07-24 NOTE — ED Provider Notes (Signed)
CSN: [id number]     Arrival date & time 07/24/15  1634 History   First MD Initiated Contact with Patient 07/24/15 1820     Chief Complaint  Patient presents with  . Abdominal Pain     (Consider location/radiation/quality/duration/timing/severity/associated sxs/prior Treatment) HPI Comments: Pt comes in with cc of abd pain. Pt has hx of cholelithiasis. She reports that she has been having abd pain in the upper quadrants for few days now, but today the pain is more severe. The pain started at 3 am, woke her up from sleep. Pain has been constant since then outside of a small time when she was pain free. The pain is described as severe sharp pain moving to her back. She has never had pain radiate in the back. In the afternoon she had fried chicken (leftover from last night) again, and her pain which was easing up got severe again. Pt has nausea, no emesis. She denies alcohol abuse, illicit drug use or any cardiac hx.  Marland Kitchen ROS 10 Systems reviewed and are negative for acute change except as noted in the HPI.     Patient is a 24 y.o. female presenting with abdominal pain. The history is provided by the patient.  Abdominal Pain Associated symptoms: no chest pain     Past Medical History  Diagnosis Date  . Chlamydia 10-2011  . Infection   . Headache(784.0)   . Heart palpitations   . Anemia    Past Surgical History  Procedure Laterality Date  . No past surgeries     Family History  Problem Relation Age of Onset  . Anesthesia problems Neg Hx   . Other Neg Hx   . Diabetes Maternal Grandmother   . Hypertension Mother    Social History  Substance Use Topics  . Smoking status: Light Tobacco Smoker -- 0.25 packs/day for 3 years    Types: Cigarettes  . Smokeless tobacco: Never Used  . Alcohol Use: No   OB History    Gravida Para Term Preterm AB TAB SAB Ectopic Multiple Living   0 0 0 0 0 0 2     Review of Systems  Cardiovascular: Negative for chest pain.   Gastrointestinal: Positive for abdominal pain.  All other systems reviewed and are negative.     Allergies  Metronidazole  Home Medications   Prior to Admission medications   Medication Sig Start Date End Date Taking? Authorizing Provider  ibuprofen (ADVIL,MOTRIN) 200 MG tablet Take 200 mg by mouth every 6 (six) hours as needed for mild pain or moderate pain.   Yes Historical Provider, MD  ondansetron (ZOFRAN) 4 MG tablet Take 1 tablet (4 mg total) by mouth every 6 (six) hours. 07/02/15  Yes Heather Laisure, PA-C  HYDROcodone-acetaminophen (NORCO/VICODIN) 5-325 MG tablet Take 1-2 tablets by mouth every 6 (six) hours as needed. 07/24/15   Derwood Kaplan, MD  ondansetron (ZOFRAN ODT) 8 MG disintegrating tablet Take 1 tablet (8 mg total) by mouth every 8 (eight) hours as needed for nausea. 07/24/15   Kenzie Thoreson Rhunette Croft, MD   BP 123/87 mmHg  Pulse 83  Temp(Src) 97.9 F (36.6 C) (Oral)  Resp 16  Ht  (1.676 m)  Wt 138 lb (62.596 kg)  BMI 22.28 kg/m2  SpO2 100%  LMP 07/14/2015 Physical Exam  Constitutional: She is oriented to person, place, and time. She appears well-developed.  HENT:  Head: Normocephalic and atraumatic.  Eyes: Conjunctivae and EOM are normal. Pupils are equal, round, and  reactive to light.  Neck: Normal range of motion. Neck supple.  Cardiovascular: Normal rate, regular rhythm and normal heart sounds.   Pulmonary/Chest: Effort normal and breath sounds normal. No respiratory distress.  Abdominal: Soft. Bowel sounds are normal. She exhibits no distension. There is tenderness. There is guarding. There is no rebound.  Tenderness over the RUQ with + murphy's and epigastrium + RLQ tenderness  Neurological: She is alert and oriented to person, place, and time.  Skin: Skin is warm and dry.  Nursing note and vitals reviewed.   ED Course  Procedures (including critical care time) Labs Review Labs Reviewed  COMPREHENSIVE METABOLIC PANEL - Abnormal; Notable for the  following:    BUN <5 (*)    AST 538 (*)    ALT 332 (*)    Total Bilirubin 1.5 (*)    All other components within normal limits  CBC - Abnormal; Notable for the following:    Hemoglobin 11.4 (*)    HCT 35.5 (*)    MCV 71.7 (*)    MCH 23.0 (*)    All other components within normal limits  URINALYSIS, ROUTINE W REFLEX MICROSCOPIC (NOT AT [facility]) - Abnormal; Notable for the following:    Color, Urine AMBER (*)    APPearance TURBID (*)    pH 8.5 (*)    Bilirubin Urine SMALL (*)    Ketones, ur 15 (*)    Leukocytes, UA TRACE (*)    All other components within normal limits  URINE MICROSCOPIC-ADD ON - Abnormal; Notable for the following:    Squamous Epithelial / LPF FEW (*)    Bacteria, UA MANY (*)    All other components within normal limits  LIPASE, BLOOD  I-STAT BETA HCG BLOOD, ED ([facility], [facility], [facility] ONLY)    Imaging Review US Abdomen Limited  07/24/2015  CLINICAL DATA:  Right upper quadrant abdominal pain, nausea EXAM: US ABDOMEN LIMITED - RIGHT UPPER QUADRANT COMPARISON:  07/02/2015 FINDINGS: Gallbladder: Layering gallstones with gallbladder sludge. No gallbladder wall thickening or pericholecystic fluid. Negative sonographic Murphy's sign. Common bile duct: Diameter: 4 mm Liver: No focal lesion identified. Within normal limits in parenchymal echogenicity. IMPRESSION: Cholelithiasis, without associated sonographic findings to suggest acute cholecystitis. Electronically Signed   By: Charline Bills M.D.   On: 07/24/2015 19:45   I have personally reviewed and evaluated these images and lab results as part of my medical decision-making.   EKG Interpretation   Date/Time:  Friday July 24 2015 16:43:42 EDT Ventricular Rate:  75 PR Interval:  128 QRS Duration: 78 QT Interval:  394 QTC Calculation: 439 R Axis:   88 Text Interpretation:  Normal sinus rhythm Normal ECG Nonspecific ST and T  wave abnormality unchanged inferior and lateral q waves No significant  change was found Confirmed  by Sheridan Hew, MD, Tamanika Heiney ([id number]) on 07/24/2015  7:02:22 PM       pm: Pt is symptom free. Pain is 1/10. [place] is neg for acute chole. She has no guarding. No lower quad abd tenderness either. She has no f/u. Advised that she must f/u with surg. Dietary recs given.  MDM   Final diagnoses:  Calculus of gallbladder without cholecystitis without obstruction  Elevated transaminase level    Pt comes in with cc of abd pain. Pain in the RUQ and epigastrium - with guarding. She has some RLQ tenderness as well. LFTs back when i saw her, and they are eelvated, with a normal lipase. Initial clinical concern is that pt has cholecystitis.  If [place] is showing cholelithiasis only, then we will reassess her, and decide if she needs a pelvic exam/workup and any other advanced imaging.     Derwood KaplanAnkit Sterling Mondo, MD 07/24/15 2232

## 2015-07-24 NOTE — ED Notes (Signed)
Pt had one episode of vomiting. Nanavati MD made aware. ODT zofran ordered

## 2015-07-24 NOTE — ED Notes (Signed)
Pt transported to [facility]

## 2015-08-19 ENCOUNTER — Emergency Department (HOSPITAL_COMMUNITY)
Admission: EM | Admit: 2015-08-19 | Discharge: 2015-08-19 | Disposition: A | Discharge status: Home / Self Care | Payer: Medicaid Other | Attending: Emergency Medicine | Admitting: Emergency Medicine

## 2015-08-19 ENCOUNTER — Encounter (HOSPITAL_COMMUNITY): Payer: Self-pay | Admitting: Emergency Medicine

## 2015-08-19 ENCOUNTER — Emergency Department (HOSPITAL_COMMUNITY): Payer: Medicaid Other

## 2015-08-19 DIAGNOSIS — Z862 Personal history of diseases of the blood and blood-forming organs and certain disorders involving the immune mechanism: Secondary | ICD-10-CM | POA: Diagnosis not present

## 2015-08-19 DIAGNOSIS — R1010 Upper abdominal pain, unspecified: Secondary | ICD-10-CM

## 2015-08-19 DIAGNOSIS — K8020 Calculus of gallbladder without cholecystitis without obstruction: Secondary | ICD-10-CM | POA: Diagnosis not present

## 2015-08-19 DIAGNOSIS — Z8659 Personal history of other mental and behavioral disorders: Secondary | ICD-10-CM | POA: Insufficient documentation

## 2015-08-19 DIAGNOSIS — Z3202 Encounter for pregnancy test, result negative: Secondary | ICD-10-CM | POA: Insufficient documentation

## 2015-08-19 DIAGNOSIS — F17210 Nicotine dependence, cigarettes, uncomplicated: Secondary | ICD-10-CM | POA: Diagnosis not present

## 2015-08-19 DIAGNOSIS — Z8619 Personal history of other infectious and parasitic diseases: Secondary | ICD-10-CM | POA: Diagnosis not present

## 2015-08-19 HISTORY — DX: Anxiety disorder, unspecified: F41.9

## 2015-08-19 HISTORY — DX: Cholecystitis, unspecified: K81.9

## 2015-08-19 LAB — CBC WITH DIFFERENTIAL/PLATELET
BASOS PCT: 1 %
Basophils Absolute: 0.1 10*3/uL (ref 0.0–0.1)
Eosinophils Absolute: 0.3 10*3/uL (ref 0.0–0.7)
Eosinophils Relative: 4 %
HEMATOCRIT: 35.7 % — AB (ref 36.0–46.0)
HEMOGLOBIN: 11.4 g/dL — AB (ref 12.0–15.0)
LYMPHS PCT: 43 %
Lymphs Abs: 3.3 10*3/uL (ref 0.7–4.0)
MCH: 23.5 pg — AB (ref 26.0–34.0)
MCHC: 31.9 g/dL (ref 30.0–36.0)
MCV: 73.5 fL — ABNORMAL LOW (ref 78.0–100.0)
MONOS PCT: 6 %
Monocytes Absolute: 0.5 10*3/uL (ref 0.1–1.0)
NEUTROS ABS: 3.5 10*3/uL (ref 1.7–7.7)
NEUTROS PCT: 46 %
Platelets: 374 10*3/uL (ref 150–400)
RBC: 4.86 MIL/uL (ref 3.87–5.11)
RDW: 14.8 % (ref 11.5–15.5)
WBC: 7.7 10*3/uL (ref 4.0–10.5)

## 2015-08-19 LAB — COMPREHENSIVE METABOLIC PANEL
ALBUMIN: 3.7 g/dL (ref 3.5–5.0)
ALK PHOS: 50 U/L (ref 38–126)
ALT: 25 U/L (ref 14–54)
ANION GAP: 6 (ref 5–15)
AST: 70 U/L — ABNORMAL HIGH (ref 15–41)
BILIRUBIN TOTAL: 0.4 mg/dL (ref 0.3–1.2)
BUN: 7 mg/dL (ref 6–20)
CALCIUM: 8.7 mg/dL — AB (ref 8.9–10.3)
CO2: 28 mmol/L (ref 22–32)
CREATININE: 0.69 mg/dL (ref 0.44–1.00)
Chloride: 106 mmol/L (ref 101–111)
GFR calc non Af Amer: >60 mL/min (ref >60)
GLUCOSE: 75 mg/dL (ref 65–99)
Potassium: 3.6 mmol/L (ref 3.5–5.1)
Sodium: 140 mmol/L (ref 135–145)
TOTAL PROTEIN: 7.3 g/dL (ref 6.5–8.1)

## 2015-08-19 LAB — I-STAT BETA HCG BLOOD, ED (MC, WL, AP ONLY): I-stat hCG, quantitative: <5 m[IU]/mL (ref <5)

## 2015-08-19 LAB — LIPASE, BLOOD: Lipase: 27 U/L (ref 11–51)

## 2015-08-19 MED ORDER — GI COCKTAIL ~~LOC~~
30.0000 mL | Freq: Once | ORAL | Status: AC
Start: 2015-08-19 — End: 2015-08-19
  Administered 2015-08-19: 30 mL via ORAL

## 2015-08-19 MED ORDER — PANTOPRAZOLE SODIUM 40 MG IV SOLR
40.0000 mg | Freq: Once | INTRAVENOUS | Status: AC
  Administered 2015-08-19: 40 mg via INTRAVENOUS

## 2015-08-19 MED ORDER — ONDANSETRON HCL 4 MG/2ML IJ SOLN
4.0000 mg | Freq: Once | INTRAMUSCULAR | Status: AC
  Administered 2015-08-19: 4 mg via INTRAVENOUS

## 2015-08-19 MED ORDER — DICYCLOMINE HCL 20 MG PO TABS: 20.0000 mg | ORAL_TABLET | Freq: Two times a day (BID) | ORAL | Status: DC

## 2015-08-19 MED ORDER — KETOROLAC TROMETHAMINE 30 MG/ML IJ SOLN
30.0000 mg | Freq: Once | INTRAMUSCULAR | Status: AC
  Administered 2015-08-19: 30 mg via INTRAVENOUS

## 2015-08-19 MED ORDER — SODIUM CHLORIDE 0.9 % IV BOLUS (SEPSIS)
1000.0000 mL | Freq: Once | INTRAVENOUS | Status: AC
  Administered 2015-08-19: 1000 mL via INTRAVENOUS

## 2015-08-19 MED ORDER — DICYCLOMINE HCL 10 MG/ML IM SOLN
20.0000 mg | Freq: Once | INTRAMUSCULAR | Status: AC
  Administered 2015-08-19: 20 mg via INTRAMUSCULAR

## 2015-08-19 MED ORDER — MORPHINE SULFATE (PF) 4 MG/ML IV SOLN: 4.0000 mg | Freq: Once | INTRAVENOUS | Status: DC

## 2015-08-19 NOTE — ED Notes (Signed)
Pt presents from home via EMS c/o upper abdominal pain.  Hx of cholecystitis w/o surgical removal despite recommendations for same.  EMS reports BP 128/88, HR 74, RR 24.  Alert/oriented, ambulatory.

## 2015-08-19 NOTE — ED Provider Notes (Signed)
CSN: [id number]     Arrival date & time 08/19/15  0005 History   First MD Initiated Contact with Patient 08/19/15 0009     Chief Complaint  Patient presents with  . Abdominal Pain     (Consider location/radiation/quality/duration/timing/severity/associated sxs/prior Treatment) HPI Comments: 24 year old female with a history of cholelithiasis presents to the emergency department for evaluation of sudden onset upper abdominal pain which woke her from sleep this evening. Pain has been constant and worsening. It is nonradiating, present mostly in her epigastrium. She reports that she usually has Norco to take for pain control, but ran out of this medication. Patient has had no fever, nausea, or vomiting. She has been having normal bowel movements. No complaints of dysuria or hematuria. No vaginal discharge or pelvic pain. Patient is currently menstruating. She reports that she has not followed up with a general surgeon as previously instructed because she "has a lot of things going on". She denies a history of abdominal surgeries.  Patient is a 24 y.o. female presenting with abdominal pain. The history is provided by the patient. No language interpreter was used.  Abdominal Pain Associated symptoms: no dysuria, no fever, no nausea, no vaginal discharge and no vomiting     Past Medical History  Diagnosis Date  . Chlamydia 10-2011  . Infection   . Headache(784.0)   . Heart palpitations   . Anemia   . Cholecystitis   . Anxiety    Past Surgical History  Procedure Laterality Date  . No past surgeries     Family History  Problem Relation Age of Onset  . Anesthesia problems Neg Hx   . Other Neg Hx   . Diabetes Maternal Grandmother   . Hypertension Mother    Social History  Substance Use Topics  . Smoking status: Light Tobacco Smoker -- 0.25 packs/day for 3 years    Types: Cigarettes  . Smokeless tobacco: Never Used  . Alcohol Use: Yes     Comment: socially   OB History    Gravida  Para Term Preterm AB TAB SAB Ectopic Multiple Living   2 2 2  0 0 0 0 0 0 2      Review of Systems  Constitutional: Negative for fever.  Gastrointestinal: Positive for abdominal pain. Negative for nausea and vomiting.  Genitourinary: Negative for dysuria, vaginal discharge and pelvic pain.  All other systems reviewed and are negative.   Allergies  Metronidazole  Home Medications   Prior to Admission medications   Medication Sig Start Date End Date Taking? Authorizing Provider  HYDROcodone-acetaminophen (NORCO/VICODIN) 5-325 MG tablet Take 1-2 tablets by mouth every 6 (six) hours as needed. Patient taking differently: Take 1-2 tablets by mouth every 6 (six) hours as needed for moderate pain.  07/24/15  Yes Derwood KaplanAnkit Nanavati, MD  ibuprofen (ADVIL,MOTRIN) 200 MG tablet Take 200 mg by mouth every 6 (six) hours as needed for mild pain or moderate pain.   Yes Historical Provider, MD  ondansetron (ZOFRAN ODT) 8 MG disintegrating tablet Take 1 tablet (8 mg total) by mouth every 8 (eight) hours as needed for nausea. 07/24/15  Yes Ankit Nanavati, MD  ondansetron (ZOFRAN) 4 MG tablet Take 1 tablet (4 mg total) by mouth every 6 (six) hours. Patient not taking: Reported on 08/19/2015 07/02/15   Santiago GladHeather Laisure, PA-C   BP 102/71 mmHg  Pulse 72  Temp(Src) 97.8 F (36.6 C) (Oral)  Resp 16  SpO2 100%  LMP 08/19/2015 (Exact Date)   Physical Exam  Constitutional:  She is oriented to person, place, and time. She appears well-developed and well-nourished. No distress.  Patient appears uncomfortable. She is nontoxic/nonseptic appearing  HENT:  Head: Normocephalic and atraumatic.  Eyes: Conjunctivae and EOM are normal. No scleral icterus.  Neck: Normal range of motion.  Cardiovascular: Normal rate, regular rhythm and intact distal pulses.   Pulmonary/Chest: Effort normal. No respiratory distress. She has no wheezes.  Respirations even and unlabored  Abdominal: Soft. She exhibits no distension. There  is tenderness. There is guarding. There is no rebound.  TTP in the epigastric and RUQ. Negative Murphy's sign. There is voluntary guarding. No masses or rebound. No TTP at McBurney's point.  Musculoskeletal: Normal range of motion.  Neurological: She is alert and oriented to person, place, and time. She exhibits normal muscle tone. Coordination normal.  GCS 15. Speech is goal oriented.  Skin: Skin is warm and dry. No rash noted. She is not diaphoretic. No erythema. No pallor.  Psychiatric: She has a normal mood and affect. Her behavior is normal.  Nursing note and vitals reviewed.   ED Course  Procedures (including critical care time) Labs Review Labs Reviewed  CBC WITH DIFFERENTIAL/PLATELET - Abnormal; Notable for the following:    Hemoglobin 11.4 (*)    HCT 35.7 (*)    MCV 73.5 (*)    MCH 23.5 (*)    All other components within normal limits  COMPREHENSIVE METABOLIC PANEL - Abnormal; Notable for the following:    Calcium 8.7 (*)    AST 70 (*)    All other components within normal limits  LIPASE, BLOOD  URINALYSIS, ROUTINE W REFLEX MICROSCOPIC (NOT AT [facility])  I-STAT BETA HCG BLOOD, ED ([facility], [facility], [facility] ONLY)    Imaging Review US Abdomen Limited  08/19/2015  CLINICAL DATA:  Acute onset right upper quadrant abdominal pain. Initial encounter. EXAM: US ABDOMEN LIMITED - RIGHT UPPER QUADRANT COMPARISON:  None. FINDINGS: Gallbladder: Multiple mobile stones are seen within the gallbladder. The gallbladder is otherwise unremarkable. No gallbladder wall thickening or pericholecystic fluid is seen. No ultrasonographic Murphy's sign is elicited. Common bile duct: Diameter: 0.3 cm, within normal limits in caliber. Liver: No focal lesion identified. Within normal limits in parenchymal echogenicity. IMPRESSION: Cholelithiasis; gallbladder otherwise unremarkable in appearance. No acute abnormality seen within the right upper quadrant. Electronically Signed   By: Roanna Raider M.D.   On: 08/19/2015 01:56      I have personally reviewed and evaluated these images and lab results as part of my medical decision-making.   EKG Interpretation None      0226 - Laboratory workup is noncontributory. Ultrasound reveals cholelithiasis which is known. No evidence of cholecystitis. Common bile duct is normal in diameter. Patient continues to complain of mild pain, though physically the patient appears to be much more comfortable. Will add Protonix, Toradol, Bentyl, and GI cocktail and reassess.  0330 - Patient sleeping comfortably; in no distress or discomfort.   Medications  morphine 4 MG/ML injection 4 mg (4 mg Intravenous Not Given 08/19/15 0118)  sodium chloride 0.9 % bolus 1,000 mL (0 mLs Intravenous Stopped 08/19/15 0249)  ondansetron (ZOFRAN) injection 4 mg (4 mg Intravenous Given 08/19/15 0118)  pantoprazole (PROTONIX) injection 40 mg (40 mg Intravenous Given 08/19/15 0241)  gi cocktail (Maalox,Lidocaine,Donnatal) (30 mLs Oral Given 08/19/15 0241)  dicyclomine (BENTYL) injection 20 mg (20 mg Intramuscular Given 08/19/15 0241)  ketorolac (TORADOL) 30 MG/ML injection 30 mg (30 mg Intravenous Given 08/19/15 0241)    MDM   Final  diagnoses:  Calculus of gallbladder without cholecystitis without obstruction  Pain of upper abdomen    24 year old female presents to the emergency department for sudden onset of upper abdominal pain. She has a history of cholelithiasis and states that her pain feels similar to her 2 prior ED presentations. She has had no nausea or vomiting. She is afebrile today and without leukocytosis. Liver function tests are improved compared to prior visit. Her ultrasound shows no evidence of acute cholecystitis or choledocholithiasis.  Patient with adequate pain control with morphine, Protonix, and GI cocktail. She has been sleeping in her exam room bed in no discomfort on her 2 most recent reassessments. Doubt emergent abdominopelvic process and do not believe further ED  workup is indicated at this time. Patient has been referred to GI for further evaluation of her frequent abdominal pain. Have also recommended that she follow-up with a general surgeon. Return precautions given at discharge. Patient discharged in good condition with no unaddressed concerns.   Filed Vitals:   08/19/15 0007 08/19/15 0030 08/19/15 0100 08/19/15 0218  BP: 141/88 98/60 109/65 102/71  Pulse: 90 77 78 72  Temp: 97.8 F (36.6 C)     TempSrc: Oral     Resp: 20   16  SpO2: 98% 100% 100% 100%     Antony Madura, PA-C 08/19/15 0416  Gilda Crease, MD 08/19/15 0700

## 2015-08-19 NOTE — Discharge Instructions (Signed)
Follow-up with a gastroenterologist and/or a general surgeon as soon as you are able. Take Bentyl as needed for pain control. You may also try Tylenol or ibuprofen.  Biliary Colic Biliary colic is a pain in the upper abdomen. The pain:  Is usually felt on the right side of the abdomen, but it may also be felt in the center of the abdomen, just below the breastbone (sternum).  May spread back toward the right shoulder blade.  May be steady or irregular.  May be accompanied by nausea and vomiting. Most of the time, the pain goes away in 1-5 hours. After the most intense pain passes, the abdomen may continue to ache mildly for about 24 hours. Biliary colic is caused by a blockage in the bile duct. The bile duct is a pathway that carries bile--a liquid that helps to digest fats--from the gallbladder to the small intestine. Biliary colic usually occurs after eating, when the digestive system demands bile. The pain develops when muscle cells contract forcefully to try to move the blockage so that bile can get by. HOME CARE INSTRUCTIONS  Take medicines only as directed by your health care provider.  Drink enough fluid to keep your urine clear or pale yellow.  Avoid fatty, greasy, and fried foods. These kinds of foods increase your body's demand for bile.  Avoid any foods that make your pain worse.  Avoid overeating.  Avoid having a large meal after fasting. SEEK MEDICAL CARE IF:  You develop a fever.  Your pain gets worse.  You vomit.  You develop nausea that prevents you from eating and drinking. SEEK IMMEDIATE MEDICAL CARE IF:  You suddenly develop a fever and shaking chills.  You develop a yellowish discoloration (jaundice) of:  Skin.  Whites of the eyes.  Mucous membranes.  You have continuous or severe pain that is not relieved with medicines.  You have nausea and vomiting that is not relieved with medicines.  You develop dizziness or you faint.   This information  is not intended to replace advice given to you by your health care provider. Make sure you discuss any questions you have with your health care provider.   Document Released: 02/20/2006 Document Revised: 02/03/2015 Document Reviewed: 07/01/2014 Elsevier Interactive Patient Education [employer]2016 [employer]

## 2015-10-04 NOTE — L&D Delivery Note (Signed)
Delivery Note IOL for GHTN, received pitocin then arom At 2:45 AM a viable female was delivered via Vaginal, Spontaneous Delivery (Presentation: ROA).  APGAR: 9, 10; weight:pending at time of note. Tight nuchal, delivered through and immediately reduced at birth. Infant placed directly on mom's abdomen for bonding/skin-to-skin. Delayed cord clamping, then cord clamped x 2, and cut by fob.     Placenta status: delivered spontaneously, intact.  Cord: 3VC, with the following complications: .  Cord pH: n/a  Anesthesia:  epidural Episiotomy: None Lacerations: Shallow supraurethral, hemostatic, not repaired Suture Repair: none Est. Blood Loss (mL): 50  Mom to postpartum.  Baby to Couplet care / Skin to Skin. Plans to breast and bottlefeed, IUD for contraception  [name], Devere Brem Randall 08/24/2016, 3:31 AM

## 2015-12-06 DIAGNOSIS — Z862 Personal history of diseases of the blood and blood-forming organs and certain disorders involving the immune mechanism: Secondary | ICD-10-CM | POA: Insufficient documentation

## 2015-12-06 DIAGNOSIS — F17210 Nicotine dependence, cigarettes, uncomplicated: Secondary | ICD-10-CM | POA: Insufficient documentation

## 2015-12-06 DIAGNOSIS — R61 Generalized hyperhidrosis: Secondary | ICD-10-CM | POA: Insufficient documentation

## 2015-12-06 DIAGNOSIS — Z8719 Personal history of other diseases of the digestive system: Secondary | ICD-10-CM | POA: Insufficient documentation

## 2015-12-06 DIAGNOSIS — Z8619 Personal history of other infectious and parasitic diseases: Secondary | ICD-10-CM | POA: Insufficient documentation

## 2015-12-06 DIAGNOSIS — Z79899 Other long term (current) drug therapy: Secondary | ICD-10-CM | POA: Insufficient documentation

## 2015-12-06 DIAGNOSIS — Z8659 Personal history of other mental and behavioral disorders: Secondary | ICD-10-CM | POA: Insufficient documentation

## 2015-12-06 DIAGNOSIS — Z3202 Encounter for pregnancy test, result negative: Secondary | ICD-10-CM | POA: Insufficient documentation

## 2015-12-06 DIAGNOSIS — R1084 Generalized abdominal pain: Secondary | ICD-10-CM | POA: Insufficient documentation

## 2015-12-06 MED ORDER — OXYCODONE-ACETAMINOPHEN 5-325 MG PO TABS
1.0000 | ORAL_TABLET | Freq: Once | ORAL | Status: AC
  Administered 2015-12-07: 1 via ORAL

## 2015-12-06 NOTE — ED Notes (Signed)
C/o R sided abd pain since 9pm.  [facility]  Pt states she was diagnosed with gallstones a few months ago and was suppose to have had surgery already.  Denies nausea, vomiting, and diarrhea.  Pt yelling and cursing.

## 2015-12-07 ENCOUNTER — Emergency Department (HOSPITAL_COMMUNITY): Payer: Self-pay

## 2015-12-07 ENCOUNTER — Emergency Department (HOSPITAL_COMMUNITY)
Admission: EM | Admit: 2015-12-07 | Discharge: 2015-12-07 | Disposition: A | Discharge status: Home / Self Care | Payer: Self-pay | Attending: Emergency Medicine | Admitting: Emergency Medicine

## 2015-12-07 ENCOUNTER — Encounter (HOSPITAL_COMMUNITY): Payer: Self-pay | Admitting: Emergency Medicine

## 2015-12-07 DIAGNOSIS — R1084 Generalized abdominal pain: Secondary | ICD-10-CM

## 2015-12-07 LAB — COMPREHENSIVE METABOLIC PANEL
ALK PHOS: 50 U/L (ref 38–126)
ALT: 17 U/L (ref 14–54)
AST: 23 U/L (ref 15–41)
Albumin: 3.7 g/dL (ref 3.5–5.0)
Anion gap: 11 (ref 5–15)
BUN: 5 mg/dL — ABNORMAL LOW (ref 6–20)
CALCIUM: 9.5 mg/dL (ref 8.9–10.3)
CO2: 23 mmol/L (ref 22–32)
CREATININE: 0.85 mg/dL (ref 0.44–1.00)
Chloride: 106 mmol/L (ref 101–111)
Glucose, Bld: 44 mg/dL — CL (ref 65–99)
Potassium: 4.2 mmol/L (ref 3.5–5.1)
Sodium: 140 mmol/L (ref 135–145)
Total Bilirubin: 0.2 mg/dL — ABNORMAL LOW (ref 0.3–1.2)
Total Protein: 8.1 g/dL (ref 6.5–8.1)

## 2015-12-07 LAB — CBC
HEMATOCRIT: 39.8 % (ref 36.0–46.0)
HEMOGLOBIN: 12.8 g/dL (ref 12.0–15.0)
MCH: 23 pg — ABNORMAL LOW (ref 26.0–34.0)
MCHC: 32.2 g/dL (ref 30.0–36.0)
MCV: 71.6 fL — AB (ref 78.0–100.0)
Platelets: 419 10*3/uL — ABNORMAL HIGH (ref 150–400)
RBC: 5.56 MIL/uL — AB (ref 3.87–5.11)
RDW: 14.3 % (ref 11.5–15.5)
WBC: 7.3 10*3/uL (ref 4.0–10.5)

## 2015-12-07 LAB — URINALYSIS, ROUTINE W REFLEX MICROSCOPIC
BILIRUBIN URINE: NEGATIVE
GLUCOSE, UA: NEGATIVE mg/dL
Hgb urine dipstick: NEGATIVE
Ketones, ur: NEGATIVE mg/dL
Leukocytes, UA: NEGATIVE
NITRITE: NEGATIVE
PH: 7 (ref 5.0–8.0)
Protein, ur: NEGATIVE mg/dL
SPECIFIC GRAVITY, URINE: 1.004 — AB (ref 1.005–1.030)

## 2015-12-07 LAB — CBG MONITORING, ED
GLUCOSE-CAPILLARY: 68 mg/dL (ref 65–99)
Glucose-Capillary: 68 mg/dL (ref 65–99)

## 2015-12-07 LAB — POC URINE PREG, ED: Preg Test, Ur: NEGATIVE

## 2015-12-07 LAB — LIPASE, BLOOD: Lipase: 28 U/L (ref 11–51)

## 2015-12-07 MED ORDER — HYDROMORPHONE HCL 1 MG/ML IJ SOLN
1.0000 mg | Freq: Once | INTRAMUSCULAR | Status: AC
  Administered 2015-12-07: 1 mg via INTRAVENOUS
  Filled 2015-12-07: qty 1

## 2015-12-07 MED ORDER — SODIUM CHLORIDE 0.9 % IV BOLUS (SEPSIS)
1000.0000 mL | Freq: Once | INTRAVENOUS | Status: AC
  Administered 2015-12-07: 1000 mL via INTRAVENOUS

## 2015-12-07 MED ORDER — OXYCODONE-ACETAMINOPHEN 5-325 MG PO TABS
ORAL_TABLET | ORAL | Status: AC
  Filled 2015-12-07: qty 1

## 2015-12-07 MED ORDER — ONDANSETRON 8 MG PO TBDP: 8.0000 mg | ORAL_TABLET | Freq: Three times a day (TID) | ORAL | Status: DC | PRN

## 2015-12-07 MED ORDER — DEXTROSE 50 % IV SOLN
1.0000 | Freq: Once | INTRAVENOUS | Status: AC
  Administered 2015-12-07: 50 mL via INTRAVENOUS
  Filled 2015-12-07: qty 50

## 2015-12-07 MED ORDER — DIPHENHYDRAMINE HCL 50 MG/ML IJ SOLN
25.0000 mg | Freq: Once | INTRAMUSCULAR | Status: AC
  Administered 2015-12-07: 25 mg via INTRAVENOUS
  Filled 2015-12-07: qty 1

## 2015-12-07 MED ORDER — HYDROMORPHONE HCL 1 MG/ML IJ SOLN
INTRAMUSCULAR | Status: AC
  Filled 2015-12-07: qty 1

## 2015-12-07 MED ORDER — ONDANSETRON HCL 4 MG/2ML IJ SOLN
4.0000 mg | Freq: Once | INTRAMUSCULAR | Status: AC
  Administered 2015-12-07: 4 mg via INTRAVENOUS
  Filled 2015-12-07: qty 2

## 2015-12-07 MED ORDER — IOHEXOL 300 MG/ML  SOLN
100.0000 mL | Freq: Once | INTRAMUSCULAR | Status: AC | PRN
  Administered 2015-12-07: 100 mL via INTRAVENOUS

## 2015-12-07 MED ORDER — HYDROCODONE-ACETAMINOPHEN 5-325 MG PO TABS: 1.0000 | ORAL_TABLET | Freq: Four times a day (QID) | ORAL | Status: DC | PRN

## 2015-12-07 MED ORDER — HYDROMORPHONE HCL 1 MG/ML IJ SOLN
0.5000 mg | Freq: Once | INTRAMUSCULAR | Status: AC
  Administered 2015-12-07: 0.5 mg via INTRAVENOUS

## 2015-12-07 NOTE — ED Notes (Signed)
Pt stable, ambulatory, states understanding of discharge instructions, family at bedside. 

## 2015-12-07 NOTE — ED Provider Notes (Signed)
CSN: [id number]     Arrival date & time 12/06/15  2347 History   By signing my name below, I, Arlan Organ, attest that this documentation has been prepared under the direction and in the presence of Gerhard Munch, MD.  Electronically Signed: Arlan Organ, ED Scribe. 12/07/2015. 2:45 AM.   Chief Complaint  Patient presents with  . Cholelithiasis   Patient is a 25 y.o. female presenting with abdominal pain. The history is provided by the patient. No language interpreter was used.  Abdominal Pain Pain location:  RUQ Pain radiates to:  Does not radiate Pain severity:  Moderate Onset quality:  Sudden Duration:  5 hours Timing:  Constant Progression:  Unchanged Chronicity:  Recurrent Relieved by:  None tried Worsened by:  Nothing tried Ineffective treatments:  None tried Associated symptoms: no chest pain, no chills, no constipation, no cough, no diarrhea, no fever, no nausea, no shortness of breath and no vomiting     HPI Comments: [name] [name] is a 25 y.o. female with a PMHx of cholecystitis who presents to the Emergency Department complaining of constant, ongoing R sided abdominal pain onset 10:00 PM this evening. Pain described as "contractions". No aggravating or alleviating factors at this time. No other associated symptoms reported. OTC Aleve and prescribed Hydrocodone attempted prior to arrival without any improvement. Pt states current symptoms feel similar to previous cholecystitis flares. No recent fever, chills, nausea, or vomiting. Pt plans to follow with a surgeon to have her gallbladder removed in the near future.  PCP: PROVIDER NOT IN SYSTEM    Past Medical History  Diagnosis Date  . Chlamydia 10-2011  . Infection   . Headache(784.0)   . Heart palpitations   . Anemia   . Cholecystitis   . Anxiety    Past Surgical History  Procedure Laterality Date  . No past surgeries     Family History  Problem Relation Age of Onset  . Anesthesia problems Neg Hx   . Other  Neg Hx   . Diabetes Maternal Grandmother   . Hypertension Mother    Social History  Substance Use Topics  . Smoking status: Current Every Day Smoker -- 0.25 packs/day for 3 years    Types: Cigarettes  . Smokeless tobacco: Never Used  . Alcohol Use: Yes     Comment: socially   OB History    Gravida Para Term Preterm AB TAB SAB Ectopic Multiple Living   0 0 0 0 0 0 2     Review of Systems  Constitutional: Negative for fever and chills.  Respiratory: Negative for cough and shortness of breath.   Cardiovascular: Negative for chest pain.  Gastrointestinal: Positive for abdominal pain. Negative for nausea, vomiting, diarrhea and constipation.  Neurological: Negative for headaches.  Psychiatric/Behavioral: Negative for confusion.  All other systems reviewed and are negative.     Allergies  Metronidazole  Home Medications   Prior to Admission medications   Medication Sig Start Date End Date Taking? Authorizing Provider  dicyclomine (BENTYL) 20 MG tablet Take 1 tablet (20 mg total) by mouth 2 (two) times daily. 08/19/15   Antony Madura, PA-C  HYDROcodone-acetaminophen (NORCO/VICODIN) 5-325 MG tablet Take 1-2 tablets by mouth every 6 (six) hours as needed. Patient taking differently: Take 1-2 tablets by mouth every 6 (six) hours as needed for moderate pain.  07/24/15   Derwood Kaplan, MD  ibuprofen (ADVIL,MOTRIN) 200 MG tablet Take 200 mg by mouth every 6 (six) hours as needed for mild  pain or moderate pain.    Historical Provider, MD  ondansetron (ZOFRAN ODT) 8 MG disintegrating tablet Take 1 tablet (8 mg total) by mouth every 8 (eight) hours as needed for nausea. 07/24/15   Derwood KaplanAnkit Nanavati, MD  ondansetron (ZOFRAN) 4 MG tablet Take 1 tablet (4 mg total) by mouth every 6 (six) hours. Patient not taking: Reported on 08/19/2015 07/02/15   Santiago GladHeather Laisure, PA-C   Triage Vitals: BP 125/71 mmHg  Pulse 77  Temp(Src) 98.3 F (36.8 C) (Oral)  Resp 17  Ht 5\' 6"  (1.676 m)  SpO2 98%   LMP 11/30/2015   Physical Exam  Constitutional: She is oriented to person, place, and time. She appears well-developed and well-nourished. She appears distressed.  Very uncomfortable young female, screaming  HENT:  Head: Normocephalic and atraumatic.  Eyes: EOM are normal.  Neck: Normal range of motion.  Cardiovascular: Normal rate, regular rhythm and normal heart sounds.   Pulmonary/Chest: Effort normal and breath sounds normal.  Abdominal: Soft. She exhibits no distension.  Tenderness to palpation throughout the right side of the abdomen.  Musculoskeletal: Normal range of motion.  Neurological: She is alert and oriented to person, place, and time.  Skin: Skin is warm. She is diaphoretic.  Psychiatric: She has a normal mood and affect. Judgment normal.  Nursing note and vitals reviewed.   ED Course  Procedures (including critical care time)  DIAGNOSTIC STUDIES: Oxygen Saturation is 100% on RA, Normal by my interpretation.    COORDINATION OF CARE: 2:23 AM- Will order imaging, blood work, and urinalysis. Will give fluids, Dilaudid, Percocet, and Zofran. Discussed treatment plan with pt at bedside and pt agreed to plan.     Labs Review Labs Reviewed  COMPREHENSIVE METABOLIC PANEL - Abnormal; Notable for the following:    Glucose, Bld 44 (*)    BUN 5 (*)    Total Bilirubin 0.2 (*)    All other components within normal limits  CBC - Abnormal; Notable for the following:    RBC 5.56 (*)    MCV 71.6 (*)    MCH 23.0 (*)    Platelets 419 (*)    All other components within normal limits  URINALYSIS, ROUTINE W REFLEX MICROSCOPIC (NOT AT [facility]) - Abnormal; Notable for the following:    Specific Gravity, Urine 1.004 (*)    All other components within normal limits  LIPASE, BLOOD  POC URINE PREG, ED    Imaging Review [place] Abdomen Limited  12/07/2015  CLINICAL DATA:  Right upper quadrant pain since yesterday, known gallstones. EXAM: US ABDOMEN LIMITED - RIGHT UPPER QUADRANT  COMPARISON:  Right upper quadrant ultrasound 08/19/2015 FINDINGS: Gallbladder: Physiologically distended. Multiple gallstones. No wall thickening visualized, wall thickness of 2.6 mm. No sonographic Murphy sign noted by sonographer. Common bile duct: Diameter: 2 mm. Liver: No focal lesion identified. Within normal limits in parenchymal echogenicity. Normal directional flow in the main portal vein. IMPRESSION: Cholelithiasis without sonographic findings of acute cholecystitis. No biliary dilatation. Electronically Signed   By: Rubye OaksMelanie  Ehinger M.D.   On: 12/07/2015 02:04   Repeat exam patient x-rays pain throughout her abdomen, right lower quadrant, right upper quadrant. CT scan ordered  I have personally reviewed and evaluated these images and lab results as part of my medical decision-making.  7:06 AM Patient calm, no new complaints. I discussed all findings with the patient and multiple family members. MDM  Female with history of gallstones presents with new severe abdominal pain. Patient is screaming, writhing, very uncomfortable initially.  Patient did have substantial pain control here. Given her diffuse tenderness, CT scans performed after initial ultrasound was reassuring. All findings reassuring, no evidence for acute cholecystitis, appendicitis, peritonitis. Patient's pain resolved, and she was discharged in stable condition with outpatient follow-up, including surgery.  Gerhard Munch, MD 12/07/15 [phone]

## 2015-12-07 NOTE — ED Notes (Addendum)
Lab called with critical glucose of 44.  Dr. Rubin PayorPickering notified and states to move pt to trauma room.

## 2015-12-07 NOTE — ED Notes (Signed)
Pt remains in triage room in severe pain.  Dr.Pickering notified of pt's pain and orders received.

## 2015-12-07 NOTE — Discharge Instructions (Signed)
As discussed, your evaluation today has been largely reassuring.  But, it is important that you monitor your condition carefully, and do not hesitate to return to the ED if you develop new, or concerning changes in your condition. ° °Otherwise, please follow-up with your physician for appropriate ongoing care. ° ° °Abdominal Pain, Adult °Many things can cause abdominal pain. Usually, abdominal pain is not caused by a disease and will improve without treatment. It can often be observed and treated at home. Your health care provider will do a physical exam and possibly order blood tests and X-rays to help determine the seriousness of your pain. However, in many cases, more time must pass before a clear cause of the pain can be found. Before that point, your health care provider may not know if you need more testing or further treatment. °HOME CARE INSTRUCTIONS °Monitor your abdominal pain for any changes. The following actions may help to alleviate any discomfort you are experiencing: °· Only take over-the-counter or prescription medicines as directed by your health care provider. °· Do not take laxatives unless directed to do so by your health care provider. °· Try a clear liquid diet (broth, tea, or water) as directed by your health care provider. Slowly move to a bland diet as tolerated. °SEEK MEDICAL CARE IF: °· You have unexplained abdominal pain. °· You have abdominal pain associated with nausea or diarrhea. °· You have pain when you urinate or have a bowel movement. °· You experience abdominal pain that wakes you in the night. °· You have abdominal pain that is worsened or improved by eating food. °· You have abdominal pain that is worsened with eating fatty foods. °· You have a fever. °SEEK IMMEDIATE MEDICAL CARE IF: °· Your pain does not go away within 2 hours. °· You keep throwing up (vomiting). °· Your pain is felt only in portions of the abdomen, such as the right side or the left lower portion of the  abdomen. °· You pass bloody or black tarry stools. °MAKE SURE YOU: °· Understand these instructions. °· Will watch your condition. °· Will get help right away if you are not doing well or get worse. °  °This information is not intended to replace advice given to you by your health care provider. Make sure you discuss any questions you have with your health care provider. °  °Document Released: 06/29/2005 Document Revised: 06/10/2015 Document Reviewed: 05/29/2013 °Elsevier Interactive Patient Education ©2016 [employer] ° ° °

## 2015-12-30 ENCOUNTER — Encounter: Payer: Self-pay | Admitting: Family Medicine

## 2015-12-30 ENCOUNTER — Emergency Department (HOSPITAL_COMMUNITY)
Admission: EM | Admit: 2015-12-30 | Discharge: 2015-12-30 | Disposition: A | Discharge status: Home / Self Care | Payer: Medicaid Other

## 2015-12-30 ENCOUNTER — Ambulatory Visit (INDEPENDENT_AMBULATORY_CARE_PROVIDER_SITE_OTHER): Payer: Medicaid Other

## 2015-12-30 DIAGNOSIS — Z3200 Encounter for pregnancy test, result unknown: Secondary | ICD-10-CM

## 2015-12-30 DIAGNOSIS — Z3201 Encounter for pregnancy test, result positive: Secondary | ICD-10-CM

## 2015-12-30 NOTE — Progress Notes (Signed)
Pt presented to clinic today for a pregnancy test which shows she is pregnant. EDD 09/01/2016. Letter verification has been given to patient to follow up to start New OB care.

## 2015-12-30 NOTE — ED Notes (Signed)
No answer

## 2015-12-30 NOTE — ED Notes (Signed)
Pt called for triage no answer x2 

## 2015-12-30 NOTE — ED Notes (Signed)
Pt called for triage. No answer.

## 2016-01-01 ENCOUNTER — Inpatient Hospital Stay (HOSPITAL_COMMUNITY): Payer: Self-pay

## 2016-01-01 ENCOUNTER — Inpatient Hospital Stay (HOSPITAL_COMMUNITY)
Admission: AD | Admit: 2016-01-01 | Discharge: 2016-01-01 | Disposition: A | Discharge status: Home / Self Care | Payer: Self-pay | Source: Ambulatory Visit | Attending: Obstetrics and Gynecology | Admitting: Obstetrics and Gynecology

## 2016-01-01 ENCOUNTER — Encounter (HOSPITAL_COMMUNITY): Payer: Self-pay

## 2016-01-01 DIAGNOSIS — F17210 Nicotine dependence, cigarettes, uncomplicated: Secondary | ICD-10-CM | POA: Insufficient documentation

## 2016-01-01 DIAGNOSIS — O26891 Other specified pregnancy related conditions, first trimester: Secondary | ICD-10-CM | POA: Insufficient documentation

## 2016-01-01 DIAGNOSIS — O26899 Other specified pregnancy related conditions, unspecified trimester: Secondary | ICD-10-CM

## 2016-01-01 DIAGNOSIS — O99331 Smoking (tobacco) complicating pregnancy, first trimester: Secondary | ICD-10-CM | POA: Insufficient documentation

## 2016-01-01 DIAGNOSIS — O9989 Other specified diseases and conditions complicating pregnancy, childbirth and the puerperium: Secondary | ICD-10-CM

## 2016-01-01 DIAGNOSIS — O99611 Diseases of the digestive system complicating pregnancy, first trimester: Secondary | ICD-10-CM | POA: Insufficient documentation

## 2016-01-01 DIAGNOSIS — A749 Chlamydial infection, unspecified: Secondary | ICD-10-CM

## 2016-01-01 DIAGNOSIS — Z3A01 Less than 8 weeks gestation of pregnancy: Secondary | ICD-10-CM | POA: Insufficient documentation

## 2016-01-01 DIAGNOSIS — R1011 Right upper quadrant pain: Secondary | ICD-10-CM | POA: Insufficient documentation

## 2016-01-01 DIAGNOSIS — R197 Diarrhea, unspecified: Secondary | ICD-10-CM | POA: Insufficient documentation

## 2016-01-01 DIAGNOSIS — K8020 Calculus of gallbladder without cholecystitis without obstruction: Secondary | ICD-10-CM | POA: Insufficient documentation

## 2016-01-01 DIAGNOSIS — R109 Unspecified abdominal pain: Secondary | ICD-10-CM

## 2016-01-01 DIAGNOSIS — O98819 Other maternal infectious and parasitic diseases complicating pregnancy, unspecified trimester: Secondary | ICD-10-CM

## 2016-01-01 LAB — URINALYSIS, ROUTINE W REFLEX MICROSCOPIC
Bilirubin Urine: NEGATIVE
Glucose, UA: NEGATIVE mg/dL
Hgb urine dipstick: NEGATIVE
KETONES UR: NEGATIVE mg/dL
LEUKOCYTES UA: NEGATIVE
NITRITE: NEGATIVE
PROTEIN: NEGATIVE mg/dL
Specific Gravity, Urine: 1.025 (ref 1.005–1.030)
pH: 6 (ref 5.0–8.0)

## 2016-01-01 LAB — CBC
HCT: 34.1 % — ABNORMAL LOW (ref 36.0–46.0)
Hemoglobin: 11.1 g/dL — ABNORMAL LOW (ref 12.0–15.0)
MCH: 23.5 pg — AB (ref 26.0–34.0)
MCHC: 32.6 g/dL (ref 30.0–36.0)
MCV: 72.2 fL — AB (ref 78.0–100.0)
PLATELETS: 364 10*3/uL (ref 150–400)
RBC: 4.72 MIL/uL (ref 3.87–5.11)
RDW: 14.6 % (ref 11.5–15.5)
WBC: 6.7 10*3/uL (ref 4.0–10.5)

## 2016-01-01 LAB — WET PREP, GENITAL
Sperm: NONE SEEN
TRICH WET PREP: NONE SEEN
YEAST WET PREP: NONE SEEN

## 2016-01-01 LAB — HCG, QUANTITATIVE, PREGNANCY: hCG, Beta Chain, Quant, S: 5754 m[IU]/mL — ABNORMAL HIGH (ref <5)

## 2016-01-01 LAB — POCT PREGNANCY, URINE: PREG TEST UR: POSITIVE — AB

## 2016-01-01 NOTE — Discharge Instructions (Signed)
First Trimester of Pregnancy The first trimester of pregnancy is from week 1 until the end of week 12 (months 1 through 3). A week after a sperm fertilizes an egg, the egg will implant on the wall of the uterus. This embryo will begin to develop into a baby. Genes from you and your partner are forming the baby. The female genes determine whether the baby is a boy or a girl. At 6-8 weeks, the eyes and face are formed, and the heartbeat can be seen on ultrasound. At the end of 12 weeks, all the baby's organs are formed.  Now that you are pregnant, you will want to do everything you can to have a healthy baby. Two of the most important things are to get good prenatal care and to follow your health care provider's instructions. Prenatal care is all the medical care you receive before the baby's birth. This care will help prevent, find, and treat any problems during the pregnancy and childbirth. BODY CHANGES Your body goes through many changes during pregnancy. The changes vary from woman to woman.   You may gain or lose a couple of pounds at first.  You may feel sick to your stomach (nauseous) and throw up (vomit). If the vomiting is uncontrollable, call your health care provider.  You may tire easily.  You may develop headaches that can be relieved by medicines approved by your health care provider.  You may urinate more often. Painful urination may mean you have a bladder infection.  You may develop heartburn as a result of your pregnancy.  You may develop constipation because certain hormones are causing the muscles that push waste through your intestines to slow down.  You may develop hemorrhoids or swollen, bulging veins (varicose veins).  Your breasts may begin to grow larger and become tender. Your nipples may stick out more, and the tissue that surrounds them (areola) may become darker.  Your gums may bleed and may be sensitive to brushing and flossing.  Dark spots or blotches (chloasma,  mask of pregnancy) may develop on your face. This will likely fade after the baby is born.  Your menstrual periods will stop.  You may have a loss of appetite.  You may develop cravings for certain kinds of food.  You may have changes in your emotions from day to day, such as being excited to be pregnant or being concerned that something may go wrong with the pregnancy and baby.  You may have more vivid and strange dreams.  You may have changes in your hair. These can include thickening of your hair, rapid growth, and changes in texture. Some women also have hair loss during or after pregnancy, or hair that feels dry or thin. Your hair will most likely return to normal after your baby is born. WHAT TO EXPECT AT YOUR PRENATAL VISITS During a routine prenatal visit:  You will be weighed to make sure you and the baby are growing normally.  Your blood pressure will be taken.  Your abdomen will be measured to track your baby's growth.  The fetal heartbeat will be listened to starting around week 10 or 12 of your pregnancy.  Test results from any previous visits will be discussed. Your health care provider may ask you:  How you are feeling.  If you are feeling the baby move.  If you have had any abnormal symptoms, such as leaking fluid, bleeding, severe headaches, or abdominal cramping.  If you are using any tobacco products,   including cigarettes, chewing tobacco, and electronic cigarettes.  If you have any questions. Other tests that may be performed during your first trimester include:  Blood tests to find your blood type and to check for the presence of any previous infections. They will also be used to check for low iron levels (anemia) and Rh antibodies. Later in the pregnancy, blood tests for diabetes will be done along with other tests if problems develop.  Urine tests to check for infections, diabetes, or protein in the urine.  An ultrasound to confirm the proper growth  and development of the baby.  An amniocentesis to check for possible genetic problems.  Fetal screens for spina bifida and Down syndrome.  You may need other tests to make sure you and the baby are doing well.  HIV (human immunodeficiency virus) testing. Routine prenatal testing includes screening for HIV, unless you choose not to have this test. HOME CARE INSTRUCTIONS  Medicines  Follow your health care provider's instructions regarding medicine use. Specific medicines may be either safe or unsafe to take during pregnancy.  Take your prenatal vitamins as directed.  If you develop constipation, try taking a stool softener if your health care provider approves. Diet  Eat regular, well-balanced meals. Choose a variety of foods, such as meat or vegetable-based protein, fish, milk and low-fat dairy products, vegetables, fruits, and whole grain breads and cereals. Your health care provider will help you determine the amount of weight gain that is right for you.  Avoid raw meat and uncooked cheese. These carry germs that can cause birth defects in the baby.  Eating four or five small meals rather than three large meals a day may help relieve nausea and vomiting. If you start to feel nauseous, eating a few soda crackers can be helpful. Drinking liquids between meals instead of during meals also seems to help nausea and vomiting.  If you develop constipation, eat more high-fiber foods, such as fresh vegetables or fruit and whole grains. Drink enough fluids to keep your urine clear or pale yellow. Activity and Exercise  Exercise only as directed by your health care provider. Exercising will help you:  Control your weight.  Stay in shape.  Be prepared for labor and delivery.  Experiencing pain or cramping in the lower abdomen or low back is a good sign that you should stop exercising. Check with your health care provider before continuing normal exercises.  Try to avoid standing for long  periods of time. Move your legs often if you must stand in one place for a long time.  Avoid heavy lifting.  Wear low-heeled shoes, and practice good posture.  You may continue to have sex unless your health care provider directs you otherwise. Relief of Pain or Discomfort  Wear a good support bra for breast tenderness.   Take warm sitz baths to soothe any pain or discomfort caused by hemorrhoids. Use hemorrhoid cream if your health care provider approves.   Rest with your legs elevated if you have leg cramps or low back pain.  If you develop varicose veins in your legs, wear support hose. Elevate your feet for 15 minutes, 3-4 times a day. Limit salt in your diet. Prenatal Care  Schedule your prenatal visits by the twelfth week of pregnancy. They are usually scheduled monthly at first, then more often in the last 2 months before delivery.  Write down your questions. Take them to your prenatal visits.  Keep all your prenatal visits as directed by your   health care provider. Safety  Wear your seat belt at all times when driving.  Make a list of emergency phone numbers, including numbers for family, friends, the hospital, and police and fire departments. General Tips  Ask your health care provider for a referral to a local prenatal education class. Begin classes no later than at the beginning of month 6 of your pregnancy.  Ask for help if you have counseling or nutritional needs during pregnancy. Your health care provider can offer advice or refer you to specialists for help with various needs.  Do not use hot tubs, steam rooms, or saunas.  Do not douche or use tampons or scented sanitary pads.  Do not cross your legs for long periods of time.  Avoid cat litter boxes and soil used by cats. These carry germs that can cause birth defects in the baby and possibly loss of the fetus by miscarriage or stillbirth.  Avoid all smoking, herbs, alcohol, and medicines not prescribed by  your health care provider. Chemicals in these affect the formation and growth of the baby.  Do not use any tobacco products, including cigarettes, chewing tobacco, and electronic cigarettes. If you need help quitting, ask your health care provider. You may receive counseling support and other resources to help you quit.  Schedule a dentist appointment. At home, brush your teeth with a soft toothbrush and be gentle when you floss. SEEK MEDICAL CARE IF:   You have dizziness.  You have mild pelvic cramps, pelvic pressure, or nagging pain in the abdominal area.  You have persistent nausea, vomiting, or diarrhea.  You have a bad smelling vaginal discharge.  You have pain with urination.  You notice increased swelling in your face, hands, legs, or ankles. SEEK IMMEDIATE MEDICAL CARE IF:   You have a fever.  You are leaking fluid from your vagina.  You have spotting or bleeding from your vagina.  You have severe abdominal cramping or pain.  You have rapid weight gain or loss.  You vomit blood or material that looks like coffee grounds.  You are exposed to German measles and have never had them.  You are exposed to fifth disease or chickenpox.  You develop a severe headache.  You have shortness of breath.  You have any kind of trauma, such as from a fall or a car accident.   This information is not intended to replace advice given to you by your health care provider. Make sure you discuss any questions you have with your health care provider.   Document Released: 09/13/2001 Document Revised: 10/10/2014 Document Reviewed: 07/30/2013 Elsevier Interactive Patient Education 2016 [employer] Inc[employer]  

## 2016-01-01 NOTE — MAU Provider Note (Signed)
Chief Complaint: Abdominal Cramping and Back Pain   First Provider Initiated Contact with Patient 01/01/16 1707     SUBJECTIVE  HPI:   [name] [name] is a 25 y.o. G3P2002 at [redacted]w[redacted]d by LMP who presents to maternity admissions reporting lower abdominal pain which wraps around lower back.  Has also had diarrhea since yesterday.  Rates pain a "4'.  She denies vaginal bleeding, vaginal itching/burning, urinary symptoms, h/a, dizziness, n/v, or fever/chills.    Abdominal Pain This is a new problem. The current episode started yesterday. The onset quality is undetermined. The problem occurs intermittently. The problem has been unchanged. The pain is located in the LLQ, RLQ and suprapubic region. The pain is mild. The quality of the pain is cramping. The abdominal pain radiates to the back. Associated symptoms include diarrhea. Pertinent negatives include no anorexia, constipation, dysuria, fever, headaches, myalgias, nausea or vomiting. Nothing aggravates the pain. The pain is relieved by nothing. She has tried nothing for the symptoms.  Diarrhea  This is a new problem. The current episode started yesterday. The problem has been unchanged. The patient states that diarrhea does not awaken her from sleep. Associated symptoms include abdominal pain. Pertinent negatives include no chills, fever, headaches, myalgias or vomiting. Nothing aggravates the symptoms. There are no known risk factors. She has tried nothing for the symptoms.   RN Note: Cramping since last night, having back pain radiating down to back of thighs, denies vaginal bleeding or discharge, diarrhea x 10 since yesterday.         Past Medical History  Diagnosis Date  . Chlamydia 10-2011  . Infection   . Headache(784.0)   . Heart palpitations   . Anemia   . Cholecystitis   . Anxiety    Past Surgical History  Procedure Laterality Date  . No past surgeries     Social History   Social History  . Marital Status: Single    Spouse Name:  N/A  . Number of Children: N/A  . Years of Education: N/A   Occupational History  . Not on file.   Social History Main Topics  . Smoking status: Current Every Day Smoker -- 0.25 packs/day for 3 years    Types: Cigarettes  . Smokeless tobacco: Never Used  . Alcohol Use: Yes     Comment: socially  . Drug Use: No  . Sexual Activity: Not Currently    Birth Control/ Protection: None   Other Topics Concern  . Not on file   Social History Narrative   No current facility-administered medications on file prior to encounter.   Current Outpatient Prescriptions on File Prior to Encounter  Medication Sig Dispense Refill  . HYDROcodone-acetaminophen (NORCO/VICODIN) 5-325 MG tablet Take 1 tablet by mouth every 6 (six) hours as needed for moderate pain. 10 tablet 0  . ibuprofen (ADVIL,MOTRIN) 200 MG tablet Take 200 mg by mouth every 6 (six) hours as needed for mild pain or moderate pain.    Marland Kitchen ondansetron (ZOFRAN ODT) 8 MG disintegrating tablet Take 1 tablet (8 mg total) by mouth every 8 (eight) hours as needed for nausea. 20 tablet 0   Allergies  Allergen Reactions  . Metronidazole Nausea And Vomiting    I have reviewed patient's Past Medical Hx, Surgical Hx, Family Hx, Social Hx, medications and allergies.   ROS:  Review of Systems  Constitutional: Negative for fever and chills.  Gastrointestinal: Positive for abdominal pain and diarrhea. Negative for nausea, vomiting, constipation and anorexia.  Genitourinary: Negative for  dysuria, flank pain, vaginal bleeding and vaginal discharge.  Musculoskeletal: Positive for back pain. Negative for myalgias.  Neurological: Negative for headaches.  Other systems negative  Physical Exam  Patient Vitals for the past 24 hrs:  BP Temp Temp src Pulse Resp  01/01/16 1657 115/74 mmHg 98.2 F (36.8 C) Oral 87 16    Physical Exam  Constitutional: Well-developed, well-nourished female in no acute distress.  Cardiovascular: normal rate Respiratory:  normal effort GI: Abd soft, non-tender. Pos BS x 4 MS: Extremities nontender, no edema, normal ROM Neurologic: Alert and oriented x 4.  GU: Neg CVAT.  PELVIC EXAM: Cervix pink, visually closed, without lesion, scant white creamy discharge, vaginal walls and external genitalia normal Bimanual exam: Cervix 0/long/high, firm, anterior, neg CMT, uterus nontender, nonenlarged, adnexa without tenderness, enlargement, or mass   LAB RESULTS Results for orders placed or performed during the hospital encounter of 01/01/16 (from the past 24 hour(s))  Urinalysis, Routine w reflex microscopic (not at [facility])     Status: Abnormal   Collection Time: 01/01/16  4:53 PM  Result Value Ref Range   Color, Urine YELLOW YELLOW   APPearance HAZY (A) CLEAR   Specific Gravity, Urine 1.025 1.005 - 1.030   pH 6.0 5.0 - 8.0   Glucose, UA NEGATIVE NEGATIVE mg/dL   Hgb urine dipstick NEGATIVE NEGATIVE   Bilirubin Urine NEGATIVE NEGATIVE   Ketones, ur NEGATIVE NEGATIVE mg/dL   Protein, ur NEGATIVE NEGATIVE mg/dL   Nitrite NEGATIVE NEGATIVE   Leukocytes, UA NEGATIVE NEGATIVE  Wet prep, genital     Status: Abnormal   Collection Time: 01/01/16  5:15 PM  Result Value Ref Range   Yeast Wet Prep HPF POC NONE SEEN NONE SEEN   Trich, Wet Prep NONE SEEN NONE SEEN   Clue Cells Wet Prep HPF POC PRESENT (A) NONE SEEN   WBC, Wet Prep HPF POC MANY (A) NONE SEEN   Sperm NONE SEEN   CBC     Status: Abnormal   Collection Time: 01/01/16  5:38 PM  Result Value Ref Range   WBC 6.7 4.0 - 10.5 K/uL   RBC 4.72 3.87 - 5.11 MIL/uL   Hemoglobin 11.1 (L) 12.0 - 15.0 g/dL   HCT 16.1 (L) 09.6 - 04.5 %   MCV 72.2 (L) 78.0 - 100.0 fL   MCH 23.5 (L) 26.0 - 34.0 pg   MCHC 32.6 30.0 - 36.0 g/dL   RDW 40.9 81.1 - 91.4 %   Platelets 364 150 - 400 K/uL       IMAGING US Ob Comp Less 14 Wks  01/01/2016  CLINICAL DATA:  Acute abdominal pain, first trimester of pregnancy. EXAM: OBSTETRIC <14 WK [place] AND TRANSVAGINAL OB US TECHNIQUE: Both  transabdominal and transvaginal ultrasound examinations were performed for complete evaluation of the gestation as well as the maternal uterus, adnexal regions, and pelvic cul-de-sac. Transvaginal technique was performed to assess early pregnancy. COMPARISON:  None. FINDINGS: Intrauterine gestational sac: Visualized/normal in shape. Yolk sac:  Visualized. Embryo:  Not visualized. Cardiac Activity: Not visualized. MSD: 7  mm   5 w   0  d Subchorionic hemorrhage:  None visualized. Maternal uterus/adnexae: Probable corpus luteum cyst seen in left ovary. Normal right ovary. Small amount of free fluid is noted which most likely is physiologic. IMPRESSION: Probable early intrauterine gestational sac with yolk sac, but no fetal pole or cardiac activity yet visualized. Recommend follow-up quantitative B-HCG levels and follow-up US in 14 days to confirm and assess viability. This  recommendation follows SRU consensus guidelines: Diagnostic Criteria for Nonviable Pregnancy Early in the First Trimester. Malva Limes Engl J Med 2013; 295:6213-08; 369:1443-51. Electronically Signed   By: Lupita RaiderJames  Green Jr, M.D.   On: 01/01/2016 18:33   [place] Ob Transvaginal  01/01/2016  CLINICAL DATA:  Acute abdominal pain, first trimester of pregnancy. EXAM: OBSTETRIC <14 WK US AND TRANSVAGINAL OB US TECHNIQUE: Both transabdominal and transvaginal ultrasound examinations were performed for complete evaluation of the gestation as well as the maternal uterus, adnexal regions, and pelvic cul-de-sac. Transvaginal technique was performed to assess early pregnancy. COMPARISON:  None. FINDINGS: Intrauterine gestational sac: Visualized/normal in shape. Yolk sac:  Visualized. Embryo:  Not visualized. Cardiac Activity: Not visualized. MSD: 7  mm   5 w   0  d Subchorionic hemorrhage:  None visualized. Maternal uterus/adnexae: Probable corpus luteum cyst seen in left ovary. Normal right ovary. Small amount of free fluid is noted which most likely is physiologic. IMPRESSION: Probable  early intrauterine gestational sac with yolk sac, but no fetal pole or cardiac activity yet visualized. Recommend follow-up quantitative B-HCG levels and follow-up US in 14 days to confirm and assess viability. This recommendation follows SRU consensus guidelines: Diagnostic Criteria for Nonviable Pregnancy Early in the First Trimester. Malva Limes Engl J Med 2013; [id number]:8469-62; 369:1443-51. Electronically Signed   By: Lupita RaiderJames  Green Jr, M.D.   On: 01/01/2016 18:33   Ct Abdomen Pelvis W Contrast  12/07/2015  CLINICAL DATA:  Right-sided abdominal pain since 9 p.m. Diagnosed with gallstones a few months ago. Right lower quadrant and right upper quadrant pain. Nausea and vomiting. EXAM: CT ABDOMEN AND PELVIS WITH CONTRAST TECHNIQUE: Multidetector CT imaging of the abdomen and pelvis was performed using the standard protocol following bolus administration of intravenous contrast. CONTRAST:  100mL OMNIPAQUE IOHEXOL 300 MG/ML  SOLN COMPARISON:  Ultrasound abdomen 12/07/2015 FINDINGS: Atelectasis in the lung bases. Gallbladder is contracted. Stones seen at ultrasound are not visible on CT. Mild heterogeneous parenchymal pattern to the liver probably due to early phase contrast enhancement. No discrete focal liver lesion. The pancreas, spleen, adrenal glands, kidneys, abdominal aorta, inferior vena cava, and retroperitoneal lymph nodes are unremarkable. Stomach, small bowel, and colon are not abnormally distended. Scattered stool in the colon. No free air or free fluid in the abdomen. Pelvis: Uterus and ovaries are not enlarged. No free or loculated pelvic fluid collections. No pelvic mass or lymphadenopathy. Appendix is normal. Bladder wall is not thickened. Mildly enlarged lymph nodes in the groin regions bilaterally, likely reactive. No destructive bone lesions. IMPRESSION: No acute process demonstrated. No evidence of bowel obstruction or inflammation. Electronically Signed   By: Burman NievesWilliam  Stevens M.D.   On: 12/07/2015 06:29   [place] Abdomen  Limited  12/07/2015  CLINICAL DATA:  Right upper quadrant pain since yesterday, known gallstones. EXAM: US ABDOMEN LIMITED - RIGHT UPPER QUADRANT COMPARISON:  Right upper quadrant ultrasound 08/19/2015 FINDINGS: Gallbladder: Physiologically distended. Multiple gallstones. No wall thickening visualized, wall thickness of 2.6 mm. No sonographic Murphy sign noted by sonographer. Common bile duct: Diameter: 2 mm. Liver: No focal lesion identified. Within normal limits in parenchymal echogenicity. Normal directional flow in the main portal vein. IMPRESSION: Cholelithiasis without sonographic findings of acute cholecystitis. No biliary dilatation. Electronically Signed   By: Rubye OaksMelanie  Ehinger M.D.   On: 12/07/2015 02:04    MAU Management/MDM: Ordered labs and reviewed results.    Ultrasound showed a yolk sac within the gestational sac, which effectively rules out ectopic pregnancy.  This pain could have represented a  normal pregnancy with pain, spontaneous abortion or even an ectopic which can be life-threatening.   I suspect this pain may be related to her diarrhea She does not appear ill.  Has had no nausea, vomiting or fever.   Cultures were done to rule out pelvic infection Blood drawn for Quant HCG, CBC, ABO/Rh  Pt stable at time of discharge.  ASSESSMENT Single intrauterine pregnancy at [redacted]w[redacted]d  Yolk sac seen Diarrhea Abdominal pain, probably related to diarrhea  PLAN Discharge home Recommend starting prenatal care soon May use Immodium at night for comfort, but recommend letting Diarrhea run its course    Medication List    ASK your doctor about these medications        HYDROcodone-acetaminophen 5-325 MG tablet  Commonly known as:  NORCO/VICODIN  Take 1 tablet by mouth every 6 (six) hours as needed for moderate pain.     ibuprofen 200 MG tablet  Commonly known as:  ADVIL,MOTRIN  Take 200 mg by mouth every 6 (six) hours as needed for mild pain or moderate pain.     ondansetron 8  MG disintegrating tablet  Commonly known as:  ZOFRAN ODT  Take 1 tablet (8 mg total) by mouth every 8 (eight) hours as needed for nausea.         Encouraged to return here or to other Urgent Care/ED if she develops worsening of symptoms, increase in pain, fever, or other concerning symptoms.    Wynelle Bourgeois CNM, MSN Certified Nurse-Midwife 01/01/2016  5:20 PM

## 2016-01-01 NOTE — MAU Note (Signed)
Cramping since last night, having back pain radiating down to back of thighs, denies vaginal bleeding or discharge, diarrhea x 10 since yesterday.

## 2016-01-02 LAB — HIV ANTIBODY (ROUTINE TESTING W REFLEX): HIV SCREEN 4TH GENERATION: NONREACTIVE

## 2016-01-04 LAB — GC/CHLAMYDIA PROBE AMP (~~LOC~~) NOT AT ARMC
Chlamydia: POSITIVE — AB
Neisseria Gonorrhea: NEGATIVE

## 2016-01-05 ENCOUNTER — Telehealth (HOSPITAL_COMMUNITY): Payer: Self-pay | Admitting: *Deleted

## 2016-01-05 DIAGNOSIS — O98811 Other maternal infectious and parasitic diseases complicating pregnancy, first trimester: Principal | ICD-10-CM

## 2016-01-05 DIAGNOSIS — A749 Chlamydial infection, unspecified: Secondary | ICD-10-CM

## 2016-01-05 MED ORDER — AZITHROMYCIN 500 MG PO TABS: ORAL_TABLET | ORAL | Status: DC

## 2016-01-05 NOTE — Telephone Encounter (Signed)
Telephone call to patient regarding positive chlamydia culture, patient notified.  Rx routed to patients pharmacy per protocol.  Instructed patient to notify her partner for treatment and to abstain from sex for seven days post treatment.  Report faxed to health department.  

## 2016-01-06 DIAGNOSIS — O98819 Other maternal infectious and parasitic diseases complicating pregnancy, unspecified trimester: Secondary | ICD-10-CM

## 2016-01-06 DIAGNOSIS — A749 Chlamydial infection, unspecified: Secondary | ICD-10-CM

## 2016-01-29 ENCOUNTER — Inpatient Hospital Stay (HOSPITAL_COMMUNITY)
Admission: AD | Admit: 2016-01-29 | Discharge: 2016-01-29 | Disposition: A | Discharge status: Home / Self Care | Payer: Medicaid Other | Source: Ambulatory Visit | Attending: Obstetrics & Gynecology | Admitting: Obstetrics & Gynecology

## 2016-01-29 ENCOUNTER — Encounter (HOSPITAL_COMMUNITY): Payer: Self-pay | Admitting: *Deleted

## 2016-01-29 ENCOUNTER — Inpatient Hospital Stay (HOSPITAL_COMMUNITY): Payer: Medicaid Other

## 2016-01-29 DIAGNOSIS — O418X10 Other specified disorders of amniotic fluid and membranes, first trimester, not applicable or unspecified: Secondary | ICD-10-CM

## 2016-01-29 DIAGNOSIS — O43891 Other placental disorders, first trimester: Secondary | ICD-10-CM

## 2016-01-29 DIAGNOSIS — Z3A09 9 weeks gestation of pregnancy: Secondary | ICD-10-CM | POA: Insufficient documentation

## 2016-01-29 DIAGNOSIS — O26899 Other specified pregnancy related conditions, unspecified trimester: Secondary | ICD-10-CM

## 2016-01-29 DIAGNOSIS — O99331 Smoking (tobacco) complicating pregnancy, first trimester: Secondary | ICD-10-CM | POA: Insufficient documentation

## 2016-01-29 DIAGNOSIS — F17210 Nicotine dependence, cigarettes, uncomplicated: Secondary | ICD-10-CM | POA: Diagnosis not present

## 2016-01-29 DIAGNOSIS — O208 Other hemorrhage in early pregnancy: Secondary | ICD-10-CM | POA: Insufficient documentation

## 2016-01-29 DIAGNOSIS — O468X1 Other antepartum hemorrhage, first trimester: Secondary | ICD-10-CM

## 2016-01-29 DIAGNOSIS — R109 Unspecified abdominal pain: Secondary | ICD-10-CM | POA: Diagnosis present

## 2016-01-29 LAB — URINALYSIS, ROUTINE W REFLEX MICROSCOPIC
BILIRUBIN URINE: NEGATIVE
Glucose, UA: NEGATIVE mg/dL
HGB URINE DIPSTICK: NEGATIVE
Ketones, ur: NEGATIVE mg/dL
Leukocytes, UA: NEGATIVE
Nitrite: NEGATIVE
PROTEIN: NEGATIVE mg/dL
Specific Gravity, Urine: >1.03 — ABNORMAL HIGH (ref 1.005–1.030)
pH: 5.5 (ref 5.0–8.0)

## 2016-01-29 MED ORDER — OXYCODONE-ACETAMINOPHEN 5-325 MG PO TABS: 1.0000 | ORAL_TABLET | Freq: Four times a day (QID) | ORAL | Status: DC | PRN

## 2016-01-29 NOTE — MAU Provider Note (Signed)
History     CSN: [id number]  Arrival date and time: 01/29/16 1717   First Provider Initiated Contact with Patient 01/29/16 1753      Chief Complaint  Patient presents with  . Abdominal Pain  . Pelvic Pain  . Back Pain   HPI [name] is a 25 y.o. G3P2002 at 6099w1d who presents to MAU today with complaint of abdominal pain. The patient was seen in MAU on 01/01/16 with abdominal pain and US showed IUGS and YS at that time. The patient was also diagnosed with Chlamydia and treated as outpatient. She states that her partner was treated as well and they did not have sex for at least 1 week after his treatment. She rates her pain at 7/10 now. She denies vaginal bleeding, abnormal discharge or UTI symptoms today. She has not taken anything for pain. She does endorse frequent nausea without vomiting, diarrhea or constipation. She also denies fever.    OB History    Gravida Para Term Preterm AB TAB SAB Ectopic Multiple Living   3 2 2  0 0 0 0 0 0 2      Past Medical History  Diagnosis Date  . Chlamydia 10-2011  . Infection   . Headache(784.0)   . Heart palpitations   . Anemia   . Cholecystitis   . Anxiety     Past Surgical History  Procedure Laterality Date  . No past surgeries      Family History  Problem Relation Age of Onset  . Anesthesia problems Neg Hx   . Other Neg Hx   . Diabetes Maternal Grandmother   . Hypertension Mother     Social History  Substance Use Topics  . Smoking status: Current Every Day Smoker -- 0.25 packs/day for 3 years    Types: Cigarettes  . Smokeless tobacco: Never Used  . Alcohol Use: Yes     Comment: socially    Allergies:  Allergies  Allergen Reactions  . Metronidazole Nausea And Vomiting    Prescriptions prior to admission  Medication Sig Dispense Refill Last Dose  . azithromycin (ZITHROMAX) 500 MG tablet Take two tablets by mouth once. (Patient not taking: Reported on 01/29/2016) 2 tablet 0   . HYDROcodone-acetaminophen  (NORCO/VICODIN) 5-325 MG tablet Take 1 tablet by mouth every 6 (six) hours as needed for moderate pain. (Patient not taking: Reported on 01/29/2016) 10 tablet 0 Past Week at Unknown time  . ondansetron (ZOFRAN ODT) 8 MG disintegrating tablet Take 1 tablet (8 mg total) by mouth every 8 (eight) hours as needed for nausea. (Patient not taking: Reported on 01/29/2016) 20 tablet 0 Past Week at Unknown time    Review of Systems  Constitutional: Negative for fever and malaise/fatigue.  Gastrointestinal: Positive for nausea and abdominal pain. Negative for vomiting, diarrhea and constipation.  Genitourinary: Negative for dysuria, urgency and frequency.       Neg - vaginal bleeding, abnormal discharge   Physical Exam   Blood pressure 124/63, pulse 87, temperature 98.3 F (36.8 C), temperature source Oral, resp. rate 16, height 5\' 6"  (1.676 m), weight 139 lb 4 oz (63.163 kg), last menstrual period 11/26/2015, currently breastfeeding.  Physical Exam  Nursing note and vitals reviewed. Constitutional: She is oriented to person, place, and time. She appears well-developed and well-nourished. No distress.  HENT:  Head: Normocephalic and atraumatic.  Cardiovascular: Normal rate.   Respiratory: Effort normal.  GI: Soft. She exhibits no distension and no mass. There is tenderness (mild tenderness to  palpation of the suprapubic region at midline and LLQ). There is no rebound and no guarding.  Genitourinary: Uterus is tender (mild). Uterus is not enlarged. Cervix exhibits no motion tenderness, no discharge and no friability. Right adnexum displays no mass and no tenderness. Left adnexum displays no mass and no tenderness. No bleeding in the vagina. Vaginal discharge (small amount of thin, white discharge noted) found.  Neurological: She is alert and oriented to person, place, and time.  Skin: Skin is warm and dry. No erythema.  Psychiatric: She has a normal mood and affect.   Results for orders placed or  performed during the hospital encounter of 01/29/16 (from the past 24 hour(s))  Urinalysis, Routine w reflex microscopic (not at [facility])     Status: Abnormal   Collection Time: 01/29/16  5:30 PM  Result Value Ref Range   Color, Urine YELLOW YELLOW   APPearance CLEAR CLEAR   Specific Gravity, Urine >1.030 (H) 1.005 - 1.030   pH 5.5 5.0 - 8.0   Glucose, UA NEGATIVE NEGATIVE mg/dL   Hgb urine dipstick NEGATIVE NEGATIVE   Bilirubin Urine NEGATIVE NEGATIVE   Ketones, ur NEGATIVE NEGATIVE mg/dL   Protein, ur NEGATIVE NEGATIVE mg/dL   Nitrite NEGATIVE NEGATIVE   Leukocytes, UA NEGATIVE NEGATIVE   US Ob Transvaginal  01/29/2016  CLINICAL DATA:  Acute onset of lower abdominal and back pain. Initial encounter. EXAM: TRANSVAGINAL OB ULTRASOUND TECHNIQUE: Transvaginal ultrasound was performed for complete evaluation of the gestation as well as the maternal uterus, adnexal regions, and pelvic cul-de-sac. COMPARISON:  None. FINDINGS: Intrauterine gestational sac: Visualized; normal in shape. Yolk sac:  Yes Embryo:  Yes Cardiac Activity: Yes Heart Rate: 180 bpm CRL:   2.50 cm   9 w 1 d                  [place] EDC: 09/01/2016 Subchorionic hemorrhage: A small amount of subchorionic hemorrhage is noted. Maternal uterus/adnexae: The uterus is otherwise unremarkable. The ovaries are within normal limits. The right ovary measures 3.8 x 1.6 x 2.6 cm, while the left ovary measures 3.6 x 2.0 x 2.6 cm. No suspicious adnexal masses are seen; there is no evidence for ovarian torsion. No free fluid is seen within the pelvic cul-de-sac. IMPRESSION: 1. Single live intrauterine pregnancy noted, with a crown-rump length of 2.5 cm, corresponding to a gestational age of [redacted] weeks 1 day. This matches the gestational age by LMP, and reflects an estimated date of delivery of September 01, 2016. 2. Small amount of subchorionic hemorrhage noted. Electronically Signed   By: Roanna Raider M.D.   On: 01/29/2016 18:29    MAU Course   Procedures None  MDM UA today [place] ordered to confirm viability due to increased abdominal pain TOC for Chlamydia obtained due to abdominal pain. Patient will be contacted with abnormal results.   Assessment and Plan  A: SIUP at [redacted]w[redacted]d Small subchorionic hemorrhage Abdominal pain in pregnancy Recent chlamydia, treated  P: Discharge home Rx for Percocet given to patient  First trimester/bleeding precautions discussed Patient advised to follow-up with [facility] as planned to start prenatal care Patient may return to MAU as needed or if her condition were to change or worsen   Marny Lowenstein, PA-C  01/29/2016, 6:57 PM

## 2016-01-29 NOTE — MAU Note (Signed)
Patient presents at [redacted] weeks gestation with c/o pelvic, back and abdominal pain since last night. Denies bleeding but states she does have a discharge.

## 2016-01-29 NOTE — Discharge Instructions (Signed)

## 2016-02-01 LAB — GC/CHLAMYDIA PROBE AMP (~~LOC~~) NOT AT ARMC
CHLAMYDIA, DNA PROBE: NEGATIVE
NEISSERIA GONORRHEA: NEGATIVE

## 2016-02-10 ENCOUNTER — Ambulatory Visit (INDEPENDENT_AMBULATORY_CARE_PROVIDER_SITE_OTHER): Payer: Medicaid Other | Admitting: Certified Nurse Midwife

## 2016-02-10 VITALS — BP 128/76 | HR 98 | Temp 98.7°F | Wt 140.0 lb

## 2016-02-10 DIAGNOSIS — Z3491 Encounter for supervision of normal pregnancy, unspecified, first trimester: Secondary | ICD-10-CM

## 2016-02-10 DIAGNOSIS — O26891 Other specified pregnancy related conditions, first trimester: Secondary | ICD-10-CM | POA: Diagnosis not present

## 2016-02-10 DIAGNOSIS — O219 Vomiting of pregnancy, unspecified: Secondary | ICD-10-CM

## 2016-02-10 DIAGNOSIS — R519 Headache, unspecified: Secondary | ICD-10-CM

## 2016-02-10 DIAGNOSIS — B3731 Acute candidiasis of vulva and vagina: Secondary | ICD-10-CM

## 2016-02-10 DIAGNOSIS — I499 Cardiac arrhythmia, unspecified: Secondary | ICD-10-CM | POA: Diagnosis not present

## 2016-02-10 DIAGNOSIS — R011 Cardiac murmur, unspecified: Secondary | ICD-10-CM

## 2016-02-10 DIAGNOSIS — Z3490 Encounter for supervision of normal pregnancy, unspecified, unspecified trimester: Secondary | ICD-10-CM | POA: Insufficient documentation

## 2016-02-10 DIAGNOSIS — Z716 Tobacco abuse counseling: Secondary | ICD-10-CM

## 2016-02-10 DIAGNOSIS — R51 Headache: Secondary | ICD-10-CM

## 2016-02-10 DIAGNOSIS — B373 Candidiasis of vulva and vagina: Secondary | ICD-10-CM

## 2016-02-10 DIAGNOSIS — Z3481 Encounter for supervision of other normal pregnancy, first trimester: Secondary | ICD-10-CM

## 2016-02-10 LAB — POCT URINALYSIS DIPSTICK
BILIRUBIN UA: NEGATIVE
Blood, UA: NEGATIVE
Clarity, UA: NEGATIVE
Glucose, UA: NEGATIVE
KETONES UA: NEGATIVE
LEUKOCYTES UA: NEGATIVE
Nitrite, UA: NEGATIVE
PH UA: 6
PROTEIN UA: NEGATIVE
Spec Grav, UA: 1.015
Urobilinogen, UA: NEGATIVE

## 2016-02-10 MED ORDER — DOXYLAMINE-PYRIDOXINE 10-10 MG PO TBEC: DELAYED_RELEASE_TABLET | ORAL | Status: DC

## 2016-02-10 MED ORDER — TERCONAZOLE 0.4 % VA CREA: 1.0000 | TOPICAL_CREAM | Freq: Every day | VAGINAL | Status: DC

## 2016-02-10 MED ORDER — BUTALBITAL-APAP-CAFFEINE 50-325-40 MG PO TABS: 1.0000 | ORAL_TABLET | Freq: Four times a day (QID) | ORAL | Status: DC | PRN

## 2016-02-10 MED ORDER — PRENATE PIXIE 10-0.6-0.4-200 MG PO CAPS: 1.0000 | ORAL_CAPSULE | Freq: Every day | ORAL | Status: DC

## 2016-02-10 MED ORDER — BUPROPION HCL ER (SR) 150 MG PO TB12: 150.0000 mg | ORAL_TABLET | Freq: Two times a day (BID) | ORAL | Status: DC

## 2016-02-10 NOTE — Progress Notes (Signed)
Subjective:    [name] [name] is being seen today for her first obstetrical visit.  This is a planned pregnancy. She is at [redacted]w[redacted]d gestation. Her obstetrical history is significant for heart palpitations, last time to cardiology was 2 years ago. Relationship with FOB: significant other, living together. Patient does intend to breast feed. Pregnancy history fully reviewed.  Reports daily HA, has a hx of migraine headaches.  Also, has a hx of reflux since she was 25 years of age.  Smoking cessation discussed, is smoking about a 1/2 pack/day.    The information documented in the HPI was reviewed and verified.  Menstrual History: OB History    Gravida Para Term Preterm AB TAB SAB Ectopic Multiple Living   0 0 0 0 0 0 2       Patient's last menstrual period was 11/26/2015 (exact date).    Past Medical History  Diagnosis Date  . Chlamydia 10-2011  . Infection   . Headache(784.0)   . Heart palpitations   . Anemia   . Cholecystitis   . Anxiety     Past Surgical History  Procedure Laterality Date  . No past surgeries       (Not in a hospital admission) Allergies  Allergen Reactions  . Metronidazole Nausea And Vomiting    Social History  Substance Use Topics  . Smoking status: Current Every Day Smoker -- 0.25 packs/day for 3 years    Types: Cigarettes  . Smokeless tobacco: Never Used  . Alcohol Use: Yes     Comment: socially    Family History  Problem Relation Age of Onset  . Anesthesia problems Neg Hx   . Other Neg Hx   . Diabetes Maternal Grandmother   . Hypertension Mother      Review of Systems Constitutional: negative for weight loss Gastrointestinal: + for vomiting, + nausea Genitourinary:negative for genital lesions and vaginal discharge and dysuria Musculoskeletal:negative for back pain Behavioral/Psych: negative for abusive relationship, depression, illegal drug usage and + tobacco use    Objective:    BP 128/76 mmHg  Pulse 98  Temp(Src) 98.7 F (37.1  C)  Wt 140 lb (63.504 kg)  LMP 11/26/2015 (Exact Date) General Appearance:    Alert, cooperative, no distress, appears stated age  Head:    Normocephalic, without obvious abnormality, atraumatic  Eyes:    PERRL, conjunctiva/corneas clear, EOM's intact, fundi    benign, both eyes  Ears:    Normal TM's and external ear canals, both ears  Nose:   Nares normal, septum midline, mucosa normal, no drainage    or sinus tenderness  Throat:   Lips, mucosa, and tongue normal; teeth and gums normal  Neck:   Supple, symmetrical, trachea midline, no adenopathy;    thyroid:  no enlargement/tenderness/nodules; no carotid   bruit or JVD  Back:     Symmetric, no curvature, ROM normal, no CVA tenderness  Lungs:     Clear to auscultation bilaterally, respirations unlabored  Chest Wall:    No tenderness or deformity   Heart:    Irregular rate and rhythm, S1 and S2 normal, + diastolic murmur, no rubs   or gallops noted.   Breast Exam:    No tenderness, masses, or nipple abnormality  Abdomen:     Soft, non-tender, bowel sounds active all four quadrants,    no masses, no organomegaly  Genitalia:    Normal female without lesion, discharge or tenderness  Extremities:   Extremities normal, atraumatic, no  cyanosis or edema  Pulses:   2+ and symmetric all extremities  Skin:   Skin color, texture, turgor normal, no rashes or lesions  Lymph nodes:   Cervical, supraclavicular, and axillary nodes normal  Neurologic:   CNII-XII intact, normal strength, sensation and reflexes    throughout        Cervix:  Long, thick, closed and posterior. Vaginal: + white thick chunky vaginal discharge noted.  FHR seen with bedside US.      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  Counseling provided regarding continued use of seat belts, cessation of alcohol consumption, smoking or use of illicit drugs; infection precautions i.e., influenza/TDAP immunizations, toxoplasmosis,CMV, parvovirus, listeria and varicella; workplace safety, exercise during pregnancy; routine dental care, safe medications, sexual activity, hot tubs, saunas, pools, travel, caffeine use, fish and methlymercury, potential toxins, hair treatments, varicose veins Weight gain recommendations per IOM guidelines reviewed: underweight/BMI< 18.5--> gain 28 - 40 lbs; normal weight/BMI 18.5 - 24.9--> gain 25 - 35 lbs; overweight/BMI 25 - 29.9--> gain 15 - 25 lbs; obese/BMI >30->gain  11 - 20 lbs Problem list reviewed and updated. FIRST/CF mutation testing/NIPT/QUAD SCREEN/fragile X/Ashkenazi Jewish population testing/Spinal muscular atrophy discussed: ordered. Role of ultrasound in pregnancy discussed; fetal survey: ordered. Amniocentesis discussed: not indicated. VBAC calculator score: VBAC consent form provided Meds ordered this encounter  Medications  . Doxylamine-Pyridoxine (DICLEGIS) 10-10 MG TBEC    Sig: Take 1 tablet with breakfast and lunch.  Take 2 tablets at bedtime.    Dispense:  100 tablet    Refill:  4  . butalbital-acetaminophen-caffeine (FIORICET) 50-325-40 MG tablet    Sig: Take 1-2 tablets by mouth every 6 (six) hours as needed for headache.    Dispense:  45 tablet    Refill:  4  . buPROPion (WELLBUTRIN SR) 150 MG 12 hr tablet    Sig: Take 1 tablet (150 mg total) by mouth 2 (two) times daily.    Dispense:  60 tablet    Refill:  2  . Prenat-FeAsp-Meth-FA-DHA w/o A (PRENATE PIXIE) 10-0.6-0.4-200 MG CAPS    Sig: Take 1 tablet by mouth daily.    Dispense:  30 capsule    Refill:  12  . terconazole (TERAZOL 7) 0.4 % vaginal cream    Sig: Place 1 applicator vaginally at bedtime.    Dispense:  45 g    Refill:  0   Orders Placed This Encounter  Procedures  .  Culture, OB Urine  . HIV antibody  . TSH  . Hemoglobinopathy evaluation  . Varicella zoster antibody, IgG  . Prenatal Profile I  . MaterniT21 PLUS Core+SCA    Order Specific Question:  Is the patient insulin dependent?    Answer:  No    Order Specific Question:  Please enter gestational age. This should be expressed as weeks AND days, i.e. 16w 6d. Enter weeks here. Enter days in next question.    Answer:  54    Order Specific Question:  Please enter gestational age. This should be expressed as weeks AND days, i.e. 16w 6d. Enter days here. Enter weeks in previous question.    Answer:  6    Order Specific Question:  How was gestational  age calculated?    Answer:  Ultrasound    Order Specific Question:  Please give the date of LMP OR Ultrasound OR Estimated date of delivery.    Answer:  01/29/2016    Order Specific Question:  Number of Fetuses (Type of Pregnancy):    Answer:  1    Order Specific Question:  Indications for performing the test? (please choose all that apply):    Answer:  Routine screening    Order Specific Question:  Other Indications? (Y=Yes, N=No)    Answer:  Y    Order Specific Question:  Please specify other indications, if any:    Answer:  cardiac arrthymia    Order Specific Question:  If this is a repeat specimen, please indicate the reason:    Answer:  Not indicated    Order Specific Question:  Please specify the patient's race: (C=White/Caucasion, B=Black, I=Native American, A=Asian, H=Hispanic, O=Other, U=Unknown)    Answer:  B    Order Specific Question:  Donor Egg - indicate if the egg was obtained from in vitro fertilization.    Answer:  N    Order Specific Question:  Age of Egg Donor.    Answer:  [age]    Order Specific Question:  Prior Down Syndrome/ONTD screening during current pregnancy.    Answer:  N    Order Specific Question:  Prior First Trimester Testing    Answer:  N    Order Specific Question:  Prior Second Trimester Testing    Answer:  N    Order  Specific Question:  Family History of Neural Tube Defects    Answer:  N    Order Specific Question:  Prior Pregnancy with Down Syndrome    Answer:  N    Order Specific Question:  Please give the patient's weight (in pounds)    Answer:  140  . Ambulatory referral to Cardiology    Referral Priority:  Urgent    Referral Type:  Consultation    Referral Reason:  Specialty Services Required    Requested Specialty:  Cardiology    Number of Visits Requested:  1  . POCT Urinalysis Dipstick    Follow up in 4 weeks. 50% of 30 min visit spent on counseling and coordination of care.

## 2016-02-11 ENCOUNTER — Emergency Department (HOSPITAL_COMMUNITY): Payer: Medicaid Other

## 2016-02-11 ENCOUNTER — Inpatient Hospital Stay (HOSPITAL_COMMUNITY)
Admission: EM | Admit: 2016-02-11 | Discharge: 2016-02-17 | DRG: 493 | Disposition: A | Discharge status: Home / Self Care | Payer: Medicaid Other | Attending: General Surgery | Admitting: General Surgery

## 2016-02-11 ENCOUNTER — Encounter (HOSPITAL_COMMUNITY): Payer: Self-pay | Admitting: Emergency Medicine

## 2016-02-11 DIAGNOSIS — F419 Anxiety disorder, unspecified: Secondary | ICD-10-CM | POA: Diagnosis present

## 2016-02-11 DIAGNOSIS — M79671 Pain in right foot: Secondary | ICD-10-CM

## 2016-02-11 DIAGNOSIS — S82899A Other fracture of unspecified lower leg, initial encounter for closed fracture: Secondary | ICD-10-CM

## 2016-02-11 DIAGNOSIS — S82852A Displaced trimalleolar fracture of left lower leg, initial encounter for closed fracture: Principal | ICD-10-CM | POA: Diagnosis present

## 2016-02-11 DIAGNOSIS — O9A211 Injury, poisoning and certain other consequences of external causes complicating pregnancy, first trimester: Secondary | ICD-10-CM | POA: Diagnosis present

## 2016-02-11 DIAGNOSIS — S82843A Displaced bimalleolar fracture of unspecified lower leg, initial encounter for closed fracture: Secondary | ICD-10-CM | POA: Diagnosis present

## 2016-02-11 DIAGNOSIS — O99341 Other mental disorders complicating pregnancy, first trimester: Secondary | ICD-10-CM | POA: Diagnosis present

## 2016-02-11 DIAGNOSIS — M79676 Pain in unspecified toe(s): Secondary | ICD-10-CM

## 2016-02-11 DIAGNOSIS — Z419 Encounter for procedure for purposes other than remedying health state, unspecified: Secondary | ICD-10-CM

## 2016-02-11 DIAGNOSIS — F17210 Nicotine dependence, cigarettes, uncomplicated: Secondary | ICD-10-CM | POA: Diagnosis present

## 2016-02-11 DIAGNOSIS — W51XXXA Accidental striking against or bumped into by another person, initial encounter: Secondary | ICD-10-CM

## 2016-02-11 DIAGNOSIS — M25569 Pain in unspecified knee: Secondary | ICD-10-CM

## 2016-02-11 DIAGNOSIS — S060XAA Concussion with loss of consciousness status unknown, initial encounter: Secondary | ICD-10-CM | POA: Diagnosis present

## 2016-02-11 DIAGNOSIS — D62 Acute posthemorrhagic anemia: Secondary | ICD-10-CM | POA: Diagnosis not present

## 2016-02-11 DIAGNOSIS — R52 Pain, unspecified: Secondary | ICD-10-CM

## 2016-02-11 DIAGNOSIS — T07XXXA Unspecified multiple injuries, initial encounter: Secondary | ICD-10-CM

## 2016-02-11 DIAGNOSIS — O99331 Smoking (tobacco) complicating pregnancy, first trimester: Secondary | ICD-10-CM | POA: Diagnosis present

## 2016-02-11 DIAGNOSIS — M25471 Effusion, right ankle: Secondary | ICD-10-CM

## 2016-02-11 DIAGNOSIS — S82842A Displaced bimalleolar fracture of left lower leg, initial encounter for closed fracture: Secondary | ICD-10-CM

## 2016-02-11 DIAGNOSIS — S060X9A Concussion with loss of consciousness of unspecified duration, initial encounter: Secondary | ICD-10-CM | POA: Diagnosis present

## 2016-02-11 DIAGNOSIS — M542 Cervicalgia: Secondary | ICD-10-CM

## 2016-02-11 DIAGNOSIS — S161XXA Strain of muscle, fascia and tendon at neck level, initial encounter: Secondary | ICD-10-CM | POA: Diagnosis present

## 2016-02-11 DIAGNOSIS — Z3A10 10 weeks gestation of pregnancy: Secondary | ICD-10-CM

## 2016-02-11 LAB — COMPREHENSIVE METABOLIC PANEL
ALT: 91 U/L — AB (ref 14–54)
ANION GAP: 12 (ref 5–15)
AST: 176 U/L — ABNORMAL HIGH (ref 15–41)
Albumin: 3.4 g/dL — ABNORMAL LOW (ref 3.5–5.0)
Alkaline Phosphatase: 34 U/L — ABNORMAL LOW (ref 38–126)
BUN: 7 mg/dL (ref 6–20)
CALCIUM: 8.9 mg/dL (ref 8.9–10.3)
CHLORIDE: 108 mmol/L (ref 101–111)
CO2: 16 mmol/L — AB (ref 22–32)
CREATININE: 0.75 mg/dL (ref 0.44–1.00)
Glucose, Bld: 149 mg/dL — ABNORMAL HIGH (ref 65–99)
Potassium: 3.4 mmol/L — ABNORMAL LOW (ref 3.5–5.1)
SODIUM: 136 mmol/L (ref 135–145)
Total Bilirubin: 0.5 mg/dL (ref 0.3–1.2)
Total Protein: 6.9 g/dL (ref 6.5–8.1)

## 2016-02-11 LAB — CDS SEROLOGY

## 2016-02-11 LAB — I-STAT CG4 LACTIC ACID, ED: Lactic Acid, Venous: 2.67 mmol/L (ref 0.5–2.0)

## 2016-02-11 LAB — I-STAT CHEM 8, ED
BUN: 7 mg/dL (ref 6–20)
CALCIUM ION: 1.13 mmol/L (ref 1.12–1.23)
CREATININE: 0.6 mg/dL (ref 0.44–1.00)
Chloride: 106 mmol/L (ref 101–111)
GLUCOSE: 149 mg/dL — AB (ref 65–99)
HCT: 39 % (ref 36.0–46.0)
HEMOGLOBIN: 13.3 g/dL (ref 12.0–15.0)
POTASSIUM: 3.4 mmol/L — AB (ref 3.5–5.1)
Sodium: 139 mmol/L (ref 135–145)
TCO2: 19 mmol/L (ref 0–100)

## 2016-02-11 LAB — CBC
HCT: 33.2 % — ABNORMAL LOW (ref 36.0–46.0)
HEMOGLOBIN: 10.6 g/dL — AB (ref 12.0–15.0)
MCH: 22.6 pg — ABNORMAL LOW (ref 26.0–34.0)
MCHC: 31.9 g/dL (ref 30.0–36.0)
MCV: 70.9 fL — AB (ref 78.0–100.0)
PLATELETS: 323 10*3/uL (ref 150–400)
RBC: 4.68 MIL/uL (ref 3.87–5.11)
RDW: 13.5 % (ref 11.5–15.5)
WBC: 18 10*3/uL — AB (ref 4.0–10.5)

## 2016-02-11 LAB — SAMPLE TO BLOOD BANK

## 2016-02-11 LAB — ETHANOL

## 2016-02-11 LAB — PROTIME-INR
INR: 1.13 (ref 0.00–1.49)
PROTHROMBIN TIME: 14.7 s (ref 11.6–15.2)

## 2016-02-11 LAB — I-STAT BETA HCG BLOOD, ED (MC, WL, AP ONLY): I-stat hCG, quantitative: >2000 m[IU]/mL — ABNORMAL HIGH (ref <5)

## 2016-02-11 MED ORDER — MORPHINE SULFATE (PF) 4 MG/ML IV SOLN
4.0000 mg | Freq: Once | INTRAVENOUS | Status: AC
  Administered 2016-02-11: 4 mg via INTRAVENOUS
  Filled 2016-02-11: qty 1

## 2016-02-11 MED ORDER — TETANUS-DIPHTH-ACELL PERTUSSIS 5-2.5-18.5 LF-MCG/0.5 IM SUSP: 0.5000 mL | Freq: Once | INTRAMUSCULAR | Status: DC

## 2016-02-11 MED ORDER — FENTANYL CITRATE (PF) 100 MCG/2ML IJ SOLN
50.0000 ug | Freq: Once | INTRAMUSCULAR | Status: AC
  Administered 2016-02-11: 50 ug via INTRAVENOUS
  Filled 2016-02-11: qty 2

## 2016-02-11 MED ORDER — MORPHINE SULFATE (PF) 4 MG/ML IV SOLN
4.0000 mg | Freq: Once | INTRAVENOUS | Status: AC
  Administered 2016-02-12: 4 mg via INTRAVENOUS
  Filled 2016-02-11: qty 1

## 2016-02-11 NOTE — ED Notes (Signed)
Family at beside. Family given emotional support. 
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
## 2016-02-11 NOTE — ED Notes (Signed)
Family at bedside. 

## 2016-02-11 NOTE — ED Notes (Signed)
Mother called at request of patient.   Pt signing consent for radiation with pregnancy. Portable of xray at bedside.

## 2016-02-11 NOTE — ED Notes (Signed)
Notified Kathy OB RR of patient's presence in department.
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
## 2016-02-12 ENCOUNTER — Inpatient Hospital Stay (HOSPITAL_COMMUNITY): Payer: Medicaid Other

## 2016-02-12 ENCOUNTER — Inpatient Hospital Stay (HOSPITAL_COMMUNITY): Payer: Medicaid Other | Admitting: Anesthesiology

## 2016-02-12 ENCOUNTER — Other Ambulatory Visit: Payer: Self-pay | Admitting: Certified Nurse Midwife

## 2016-02-12 ENCOUNTER — Encounter (HOSPITAL_COMMUNITY): Admission: EM | Disposition: A | Discharge status: Home / Self Care | Payer: Self-pay | Source: Home / Self Care

## 2016-02-12 DIAGNOSIS — Z3A10 10 weeks gestation of pregnancy: Secondary | ICD-10-CM | POA: Diagnosis not present

## 2016-02-12 DIAGNOSIS — D62 Acute posthemorrhagic anemia: Secondary | ICD-10-CM | POA: Diagnosis present

## 2016-02-12 DIAGNOSIS — S161XXA Strain of muscle, fascia and tendon at neck level, initial encounter: Secondary | ICD-10-CM | POA: Diagnosis present

## 2016-02-12 DIAGNOSIS — F17210 Nicotine dependence, cigarettes, uncomplicated: Secondary | ICD-10-CM | POA: Diagnosis present

## 2016-02-12 DIAGNOSIS — S060X9A Concussion with loss of consciousness of unspecified duration, initial encounter: Secondary | ICD-10-CM | POA: Diagnosis present

## 2016-02-12 DIAGNOSIS — O99341 Other mental disorders complicating pregnancy, first trimester: Secondary | ICD-10-CM | POA: Diagnosis present

## 2016-02-12 DIAGNOSIS — M79604 Pain in right leg: Secondary | ICD-10-CM | POA: Diagnosis present

## 2016-02-12 DIAGNOSIS — F419 Anxiety disorder, unspecified: Secondary | ICD-10-CM | POA: Diagnosis present

## 2016-02-12 DIAGNOSIS — O9A211 Injury, poisoning and certain other consequences of external causes complicating pregnancy, first trimester: Secondary | ICD-10-CM | POA: Diagnosis present

## 2016-02-12 DIAGNOSIS — S82843A Displaced bimalleolar fracture of unspecified lower leg, initial encounter for closed fracture: Secondary | ICD-10-CM | POA: Diagnosis present

## 2016-02-12 DIAGNOSIS — O99331 Smoking (tobacco) complicating pregnancy, first trimester: Secondary | ICD-10-CM | POA: Diagnosis present

## 2016-02-12 DIAGNOSIS — S82852A Displaced trimalleolar fracture of left lower leg, initial encounter for closed fracture: Secondary | ICD-10-CM | POA: Diagnosis present

## 2016-02-12 HISTORY — PX: ORIF ANKLE FRACTURE: SHX5408

## 2016-02-12 LAB — URINALYSIS, ROUTINE W REFLEX MICROSCOPIC
BILIRUBIN URINE: NEGATIVE
Glucose, UA: NEGATIVE mg/dL
KETONES UR: 40 mg/dL — AB
Leukocytes, UA: NEGATIVE
NITRITE: NEGATIVE
Protein, ur: 100 mg/dL — AB
SPECIFIC GRAVITY, URINE: 1.019 (ref 1.005–1.030)
pH: 6.5 (ref 5.0–8.0)

## 2016-02-12 LAB — SURGICAL PCR SCREEN
MRSA, PCR: NEGATIVE
Staphylococcus aureus: NEGATIVE

## 2016-02-12 LAB — CBC
HCT: 29.2 % — ABNORMAL LOW (ref 36.0–46.0)
Hemoglobin: 9.4 g/dL — ABNORMAL LOW (ref 12.0–15.0)
MCH: 23.5 pg — AB (ref 26.0–34.0)
MCHC: 32.2 g/dL (ref 30.0–36.0)
MCV: 73 fL — ABNORMAL LOW (ref 78.0–100.0)
PLATELETS: 283 10*3/uL (ref 150–400)
RBC: 4 MIL/uL (ref 3.87–5.11)
RDW: 13.8 % (ref 11.5–15.5)
WBC: 12 10*3/uL — ABNORMAL HIGH (ref 4.0–10.5)

## 2016-02-12 LAB — PAP IG W/ RFLX HPV ASCU: PAP Smear Comment: 0

## 2016-02-12 LAB — URINE MICROSCOPIC-ADD ON

## 2016-02-12 LAB — BASIC METABOLIC PANEL
Anion gap: 12 (ref 5–15)
BUN: 6 mg/dL (ref 6–20)
CALCIUM: 8.5 mg/dL — AB (ref 8.9–10.3)
CHLORIDE: 110 mmol/L (ref 101–111)
CO2: 17 mmol/L — ABNORMAL LOW (ref 22–32)
CREATININE: 0.55 mg/dL (ref 0.44–1.00)
GFR calc Af Amer: >60 mL/min (ref >60)
Glucose, Bld: 75 mg/dL (ref 65–99)
Potassium: 3.6 mmol/L (ref 3.5–5.1)
SODIUM: 139 mmol/L (ref 135–145)

## 2016-02-12 LAB — URINE CULTURE, OB REFLEX

## 2016-02-12 LAB — CULTURE, OB URINE

## 2016-02-12 SURGERY — OPEN REDUCTION INTERNAL FIXATION (ORIF) ANKLE FRACTURE
Anesthesia: General | Laterality: Left

## 2016-02-12 MED ORDER — LACTATED RINGERS IV SOLN
INTRAVENOUS | Status: DC
Start: 2016-02-12 — End: 2016-02-15
  Administered 2016-02-12 (×3): via INTRAVENOUS

## 2016-02-12 MED ORDER — MORPHINE SULFATE (PF) 2 MG/ML IV SOLN
2.0000 mg | INTRAVENOUS | Status: DC | PRN
  Administered 2016-02-12: 2 mg via INTRAVENOUS
  Filled 2016-02-12: qty 1

## 2016-02-12 MED ORDER — ROPIVACAINE HCL 5 MG/ML IJ SOLN
INTRAMUSCULAR | Status: DC | PRN
  Administered 2016-02-12: 20 mL via PERINEURAL

## 2016-02-12 MED ORDER — MORPHINE SULFATE (PF) 2 MG/ML IV SOLN
4.0000 mg | INTRAVENOUS | Status: DC | PRN
  Administered 2016-02-12 – 2016-02-13 (×13): 4 mg via INTRAVENOUS
  Filled 2016-02-12 (×13): qty 2

## 2016-02-12 MED ORDER — PANTOPRAZOLE SODIUM 40 MG PO TBEC
40.0000 mg | DELAYED_RELEASE_TABLET | Freq: Every day | ORAL | Status: DC
  Administered 2016-02-13 – 2016-02-15 (×3): 40 mg via ORAL
  Filled 2016-02-12 (×3): qty 1

## 2016-02-12 MED ORDER — PHENYLEPHRINE 40 MCG/ML (10ML) SYRINGE FOR IV PUSH (FOR BLOOD PRESSURE SUPPORT)
PREFILLED_SYRINGE | INTRAVENOUS | Status: AC
  Filled 2016-02-12: qty 10

## 2016-02-12 MED ORDER — CEFAZOLIN SODIUM-DEXTROSE 2-4 GM/100ML-% IV SOLN
INTRAVENOUS | Status: AC
  Administered 2016-02-12: 2 g via INTRAVENOUS
  Filled 2016-02-12: qty 100

## 2016-02-12 MED ORDER — FENTANYL CITRATE (PF) 250 MCG/5ML IJ SOLN
INTRAMUSCULAR | Status: AC
  Filled 2016-02-12: qty 5

## 2016-02-12 MED ORDER — KCL IN DEXTROSE-NACL 20-5-0.45 MEQ/L-%-% IV SOLN
INTRAVENOUS | Status: DC
  Administered 2016-02-12: 05:00:00 via INTRAVENOUS
  Filled 2016-02-12 (×3): qty 1000

## 2016-02-12 MED ORDER — PROPOFOL 10 MG/ML IV BOLUS
INTRAVENOUS | Status: DC | PRN
  Administered 2016-02-12: 50 mg via INTRAVENOUS
  Administered 2016-02-12: 20 mg via INTRAVENOUS
  Administered 2016-02-12: 50 mg via INTRAVENOUS
  Administered 2016-02-12: 40 mg via INTRAVENOUS

## 2016-02-12 MED ORDER — MIDAZOLAM HCL 2 MG/2ML IJ SOLN
INTRAMUSCULAR | Status: AC
  Filled 2016-02-12: qty 2

## 2016-02-12 MED ORDER — PROPOFOL 10 MG/ML IV BOLUS
1.0000 mg/kg | Freq: Once | INTRAVENOUS | Status: AC
  Administered 2016-02-12: 60 mg via INTRAVENOUS
  Filled 2016-02-12: qty 20

## 2016-02-12 MED ORDER — LIDOCAINE HCL (CARDIAC) 20 MG/ML IV SOLN
INTRAVENOUS | Status: DC | PRN
  Administered 2016-02-12: 60 mg via INTRAVENOUS

## 2016-02-12 MED ORDER — PANTOPRAZOLE SODIUM 40 MG IV SOLR: 40.0000 mg | Freq: Every day | INTRAVENOUS | Status: DC

## 2016-02-12 MED ORDER — MUPIROCIN 2 % EX OINT
TOPICAL_OINTMENT | CUTANEOUS | Status: AC
  Filled 2016-02-12: qty 22

## 2016-02-12 MED ORDER — ONDANSETRON HCL 4 MG/2ML IJ SOLN: 4.0000 mg | Freq: Four times a day (QID) | INTRAMUSCULAR | Status: DC | PRN

## 2016-02-12 MED ORDER — CEFAZOLIN SODIUM 1-5 GM-% IV SOLN
1.0000 g | Freq: Three times a day (TID) | INTRAVENOUS | Status: AC
  Administered 2016-02-12 – 2016-02-13 (×3): 1 g via INTRAVENOUS
  Filled 2016-02-12 (×3): qty 50

## 2016-02-12 MED ORDER — SODIUM CHLORIDE 0.9 % IR SOLN
Status: DC | PRN
  Administered 2016-02-12: 1000 mL

## 2016-02-12 MED ORDER — ONDANSETRON HCL 4 MG/2ML IJ SOLN
INTRAMUSCULAR | Status: DC | PRN
  Administered 2016-02-12: 4 mg via INTRAVENOUS

## 2016-02-12 MED ORDER — MUPIROCIN 2 % EX OINT
1.0000 "application " | TOPICAL_OINTMENT | Freq: Once | CUTANEOUS | Status: AC
  Administered 2016-02-12: 1 via TOPICAL

## 2016-02-12 MED ORDER — FENTANYL CITRATE (PF) 100 MCG/2ML IJ SOLN
INTRAMUSCULAR | Status: DC | PRN
  Administered 2016-02-12: 25 ug via INTRAVENOUS
  Administered 2016-02-12: 250 ug via INTRAVENOUS
  Administered 2016-02-12: 25 ug via INTRAVENOUS

## 2016-02-12 MED ORDER — SUCCINYLCHOLINE CHLORIDE 20 MG/ML IJ SOLN
INTRAMUSCULAR | Status: DC | PRN
  Administered 2016-02-12: 40 mg via INTRAVENOUS

## 2016-02-12 MED ORDER — BUPROPION HCL ER (SR) 150 MG PO TB12
150.0000 mg | ORAL_TABLET | Freq: Two times a day (BID) | ORAL | Status: DC
  Administered 2016-02-12 – 2016-02-17 (×11): 150 mg via ORAL
  Filled 2016-02-12 (×11): qty 1

## 2016-02-12 MED ORDER — PROPOFOL 10 MG/ML IV BOLUS
INTRAVENOUS | Status: AC
  Filled 2016-02-12: qty 20

## 2016-02-12 MED ORDER — ONDANSETRON HCL 4 MG PO TABS
4.0000 mg | ORAL_TABLET | Freq: Four times a day (QID) | ORAL | Status: DC | PRN
  Administered 2016-02-17: 4 mg via ORAL
  Filled 2016-02-12: qty 1

## 2016-02-12 MED ORDER — CEFAZOLIN SODIUM-DEXTROSE 2-4 GM/100ML-% IV SOLN: 2.0000 g | Freq: Once | INTRAVENOUS | Status: DC

## 2016-02-12 MED ORDER — FENTANYL CITRATE (PF) 100 MCG/2ML IJ SOLN
INTRAMUSCULAR | Status: AC
  Administered 2016-02-12: 100 ug via INTRAVENOUS
  Filled 2016-02-12: qty 2

## 2016-02-12 MED ORDER — PHENYLEPHRINE HCL 10 MG/ML IJ SOLN
INTRAMUSCULAR | Status: DC | PRN
  Administered 2016-02-12 (×8): 40 ug via INTRAVENOUS

## 2016-02-12 MED ORDER — BUPIVACAINE-EPINEPHRINE (PF) 0.5% -1:200000 IJ SOLN
INTRAMUSCULAR | Status: DC | PRN
  Administered 2016-02-12: 25 mL via PERINEURAL

## 2016-02-12 MED ORDER — ESMOLOL HCL 100 MG/10ML IV SOLN
INTRAVENOUS | Status: DC | PRN
  Administered 2016-02-12 (×2): 20 mg via INTRAVENOUS

## 2016-02-12 SURGICAL SUPPLY — 88 items
BANDAGE ACE 4X5 VEL STRL LF (GAUZE/BANDAGES/DRESSINGS) ×3 IMPLANT
BANDAGE ACE 6X5 VEL STRL LF (GAUZE/BANDAGES/DRESSINGS) ×3 IMPLANT
BANDAGE ELASTIC 4 VELCRO ST LF (GAUZE/BANDAGES/DRESSINGS) ×3 IMPLANT
BANDAGE ELASTIC 6 VELCRO ST LF (GAUZE/BANDAGES/DRESSINGS) ×3 IMPLANT
BANDAGE ESMARK 6X9 LF (GAUZE/BANDAGES/DRESSINGS) ×1 IMPLANT
BIT DRILL 2 QR W/DEPTH MARKS (BIT) ×3
BIT DRILL 2.8 QR W/DEPTH MARKS (BIT) ×3 IMPLANT
BIT DRILL 2MM QR W/DEPTH MARKS (BIT) ×1 IMPLANT
BNDG COHESIVE 6X5 TAN STRL LF (GAUZE/BANDAGES/DRESSINGS) ×3 IMPLANT
BNDG ESMARK 6X9 LF (GAUZE/BANDAGES/DRESSINGS) ×3
BNDG GAUZE ELAST 4 BULKY (GAUZE/BANDAGES/DRESSINGS) ×6 IMPLANT
BRUSH SCRUB DISP (MISCELLANEOUS) ×6 IMPLANT
CLEANER TIP ELECTROSURG 2X2 (MISCELLANEOUS) ×3 IMPLANT
CLOSURE WOUND 1/2 X4 (GAUZE/BANDAGES/DRESSINGS)
COVER SURGICAL LIGHT HANDLE (MISCELLANEOUS) ×6 IMPLANT
CUFF TOURNIQUET SINGLE 18IN (TOURNIQUET CUFF) IMPLANT
CUFF TOURNIQUET SINGLE 24IN (TOURNIQUET CUFF) IMPLANT
CUFF TOURNIQUET SINGLE 34IN LL (TOURNIQUET CUFF) IMPLANT
DRAPE C-ARM 42X72 X-RAY (DRAPES) IMPLANT
DRAPE C-ARMOR (DRAPES) ×3 IMPLANT
DRAPE U-SHAPE 47X51 STRL (DRAPES) ×3 IMPLANT
DRSG ADAPTIC 3X8 NADH LF (GAUZE/BANDAGES/DRESSINGS) ×3 IMPLANT
ELECT REM PT RETURN 9FT ADLT (ELECTROSURGICAL) ×3
ELECTRODE REM PT RTRN 9FT ADLT (ELECTROSURGICAL) ×1 IMPLANT
EVACUATOR 1/8 PVC DRAIN (DRAIN) IMPLANT
GAUZE SPONGE 4X4 12PLY STRL (GAUZE/BANDAGES/DRESSINGS) ×3 IMPLANT
GLOVE BIO SURGEON STRL SZ7.5 (GLOVE) ×3 IMPLANT
GLOVE BIO SURGEON STRL SZ8 (GLOVE) ×3 IMPLANT
GLOVE BIOGEL PI IND STRL 7.5 (GLOVE) ×1 IMPLANT
GLOVE BIOGEL PI IND STRL 8 (GLOVE) ×1 IMPLANT
GLOVE BIOGEL PI INDICATOR 7.5 (GLOVE) ×2
GLOVE BIOGEL PI INDICATOR 8 (GLOVE) ×2
GOWN STRL REUS W/ TWL LRG LVL3 (GOWN DISPOSABLE) ×2 IMPLANT
GOWN STRL REUS W/ TWL XL LVL3 (GOWN DISPOSABLE) ×1 IMPLANT
GOWN STRL REUS W/TWL LRG LVL3 (GOWN DISPOSABLE) ×4
GOWN STRL REUS W/TWL XL LVL3 (GOWN DISPOSABLE) ×2
GUIDEWIRE ORTH 6X062XTROC NS (WIRE) ×4 IMPLANT
HANDPIECE INTERPULSE COAX TIP (DISPOSABLE)
K-WIRE .062 (WIRE) ×8
KIT BASIN OR (CUSTOM PROCEDURE TRAY) ×3 IMPLANT
KIT ROOM TURNOVER OR (KITS) ×3 IMPLANT
MANIFOLD NEPTUNE II (INSTRUMENTS) ×3 IMPLANT
NEEDLE 22X1 1/2 (OR ONLY) (NEEDLE) IMPLANT
NS IRRIG 1000ML POUR BTL (IV SOLUTION) ×3 IMPLANT
PACK ORTHO EXTREMITY (CUSTOM PROCEDURE TRAY) ×3 IMPLANT
PAD ABD 8X10 STRL (GAUZE/BANDAGES/DRESSINGS) ×3 IMPLANT
PAD ARMBOARD 7.5X6 YLW CONV (MISCELLANEOUS) ×6 IMPLANT
PAD CAST 4YDX4 CTTN HI CHSV (CAST SUPPLIES) ×1 IMPLANT
PADDING CAST COTTON 4X4 STRL (CAST SUPPLIES) ×2
PADDING CAST COTTON 6X4 STRL (CAST SUPPLIES) ×3 IMPLANT
PEG CORT LOCK 2.3X36MM (Peg) ×1 IMPLANT
PEG CORTICAL 2.3MMX36MM (Peg) ×2 IMPLANT
PLATE LATERAL FIBULA 5HOLE (Plate) ×3 IMPLANT
PLATE PEG HOOK LOCK 3H 59MM (Plate) ×1 IMPLANT
PLATE PEG HOOK LOCKING 3HOLE (Plate) ×1 IMPLANT
SCREW HEX LOCK 2.7X14MM (Screw) ×9 IMPLANT
SCREW HEX NL 2.7X32MM (Screw) ×1 IMPLANT
SCREW HEXALOBE NON-LOCK 3.5X14 (Screw) ×3 IMPLANT
SCREW LOCK 12X2.7X HEXALOBE (Screw) ×1 IMPLANT
SCREW LOCKING 2.7X12MM (Screw) ×2 IMPLANT
SCREW NONLOCK 2.7MMX38MM (Screw) ×2 IMPLANT
SCREW NONLOCK 2.7X32MM (Screw) ×2 IMPLANT
SCREW NONLOCK HEX 3.5X12 (Screw) ×9 IMPLANT
SET HNDPC FAN SPRY TIP SCT (DISPOSABLE) IMPLANT
SPLINT PLASTER CAST XFAST 5X30 (CAST SUPPLIES) ×1 IMPLANT
SPLINT PLASTER XFAST SET 5X30 (CAST SUPPLIES) ×2
SPONGE GAUZE 4X4 12PLY STER LF (GAUZE/BANDAGES/DRESSINGS) ×3 IMPLANT
SPONGE LAP 18X18 X RAY DECT (DISPOSABLE) ×3 IMPLANT
SPONGE SCRUB IODOPHOR (GAUZE/BANDAGES/DRESSINGS) ×3 IMPLANT
STAPLER VISISTAT 35W (STAPLE) IMPLANT
STOCKINETTE IMPERVIOUS LG (DRAPES) ×3 IMPLANT
STRIP CLOSURE SKIN 1/2X4 (GAUZE/BANDAGES/DRESSINGS) IMPLANT
SUCTION FRAZIER HANDLE 10FR (MISCELLANEOUS)
SUCTION TUBE FRAZIER 10FR DISP (MISCELLANEOUS) IMPLANT
SUT ETHILON 3 0 PS 1 (SUTURE) IMPLANT
SUT VIC AB 0 CT1 27 (SUTURE) ×4
SUT VIC AB 0 CT1 27XBRD ANBCTR (SUTURE) ×2 IMPLANT
SUT VIC AB 2-0 CT1 27 (SUTURE) ×4
SUT VIC AB 2-0 CT1 TAPERPNT 27 (SUTURE) ×2 IMPLANT
SYR CONTROL 10ML LL (SYRINGE) IMPLANT
TOWEL OR 17X24 6PK STRL BLUE (TOWEL DISPOSABLE) ×6 IMPLANT
TOWEL OR 17X26 10 PK STRL BLUE (TOWEL DISPOSABLE) ×6 IMPLANT
TUBE CONNECTING 12'X1/4 (SUCTIONS) ×1
TUBE CONNECTING 12X1/4 (SUCTIONS) ×2 IMPLANT
UNDERPAD 30X30 INCONTINENT (UNDERPADS AND DIAPERS) ×3 IMPLANT
WATER STERILE IRR 1000ML POUR (IV SOLUTION) ×6 IMPLANT
WIRE TACK PLATE PL-PTACK (WIRE) ×3 IMPLANT
YANKAUER SUCT BULB TIP NO VENT (SUCTIONS) ×3 IMPLANT

## 2016-02-12 NOTE — Progress Notes (Signed)
Dr Chaney MallingHodierne updated.

## 2016-02-12 NOTE — Anesthesia Procedure Notes (Addendum)
Procedure Name: Intubation Date/Time: 02/12/2016 11:33 AM Performed by: Dairl PonderJIANG, FUDAN Pre-anesthesia Checklist: Patient identified, Emergency Drugs available, Suction available, Patient being monitored and Timeout performed Patient Re-evaluated:Patient Re-evaluated prior to inductionOxygen Delivery Method: Circle system utilized Preoxygenation: Pre-oxygenation with 100% oxygen Intubation Type: IV induction Laryngoscope Size: Glidescope and 3 (limited neck ROM) Grade View: Grade I Tube type: Oral Tube size: 6.5 mm Number of attempts: 1 Airway Equipment and Method: Stylet Placement Confirmation: ETT inserted through vocal cords under direct vision,  positive ETCO2 and breath sounds checked- equal and bilateral Secured at: 23 cm Tube secured with: Tape Dental Injury: Teeth and Oropharynx as per pre-operative assessment    Anesthesia Regional Block:  Popliteal block  Pre-Anesthetic Checklist: ,, timeout performed, Correct Patient, Correct Site, Correct Laterality, Correct Procedure, Correct Position, site marked, Risks and benefits discussed,  Surgical consent,  Pre-op evaluation,  At surgeon's request and post-op pain management  Laterality: Left  Prep: chloraprep       Needles:  Injection technique: Single-shot  Needle Type: Stimiplex     Needle Length: 9cm 9 cm Needle Gauge: 21 and 21 G    Additional Needles:  Procedures: ultrasound guided (picture in chart) Popliteal block Narrative:  Injection made incrementally with aspirations every 5 mL.  Performed by: Personally  Anesthesiologist: Jory Tanguma  Additional Notes: Risks, benefits and alternative to block explained extensively.  Patient tolerated procedure well, without complications.  Adductor canal block also performed with 0.5% Ropivicaine 20ml. No complications.
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
## 2016-02-12 NOTE — Anesthesia Preprocedure Evaluation (Addendum)
Anesthesia Evaluation  Patient identified by MRN, date of birth, ID band Patient awake    Reviewed: Allergy & Precautions, NPO status , Patient's Chart, lab work & pertinent test results  History of Anesthesia Complications Negative for: history of anesthetic complications  Airway Mallampati: III  TM Distance: >3 FB Neck ROM: Full   Comment: c collar Dental no notable dental hx. (+) Dental Advisory Given   Pulmonary Current Smoker,    Pulmonary exam normal breath sounds clear to auscultation       Cardiovascular negative cardio ROS Normal cardiovascular exam Rhythm:Regular Rate:Normal     Neuro/Psych  Headaches, negative psych ROS   GI/Hepatic negative GI ROS, Neg liver ROS,   Endo/Other  negative endocrine ROS  Renal/GU negative Renal ROS  negative genitourinary   Musculoskeletal negative musculoskeletal ROS (+)   Abdominal   Peds negative pediatric ROS (+)  Hematology negative hematology ROS (+)   Anesthesia Other Findings   Reproductive/Obstetrics (+) Pregnancy 10 weeks                            Anesthesia Physical Anesthesia Plan  ASA: II  Anesthesia Plan: General   Post-op Pain Management: GA combined w/ Regional for post-op pain   Induction: Intravenous  Airway Management Planned: Oral ETT  Additional Equipment:   Intra-op Plan:   Post-operative Plan: Extubation in OR  Informed Consent: I have reviewed the patients History and Physical, chart, labs and discussed the procedure including the risks, benefits and alternatives for the proposed anesthesia with the patient or authorized representative who has indicated his/her understanding and acceptance.   Dental advisory given  Plan Discussed with: CRNA  Anesthesia Plan Comments:        Anesthesia Quick Evaluation

## 2016-02-12 NOTE — Transfer of Care (Signed)
Immediate Anesthesia Transfer of Care Note  Patient: Renella Cunasrika Kalmar  Procedure(s) Performed: Procedure(s): OPEN REDUCTION INTERNAL FIXATION (ORIF) ANKLE FRACTURE (Left)  Patient Location: PACU  Anesthesia Type:General  Level of Consciousness: awake, alert  and oriented  Airway & Oxygen Therapy: Patient Spontanous Breathing and Patient connected to nasal cannula oxygen  Post-op Assessment: Report given to RN and Post -op Vital signs reviewed and stable  Post vital signs: Reviewed and stable  Last Vitals:  Filed Vitals:   02/12/16 0512 02/12/16 1106  BP: 129/59   Pulse: 100 98  Temp: 36.9 C   Resp: 20 16    Last Pain:  Filed Vitals:   02/12/16 1308  PainSc: 4       Patients Stated Pain Goal: 3 (02/12/16 16100637)  Complications: No apparent anesthesia complications

## 2016-02-12 NOTE — ED Notes (Signed)
Patient transported to CT 
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
## 2016-02-12 NOTE — Progress Notes (Signed)
Orthopedic Tech Progress Note Patient Details:  Renella Cunasrika Mitchener 1991/03/25 [id number]  Ortho Devices Type of Ortho Device: Stirrup splint, Post (short leg) splint Ortho Device/Splint Location: lle Ortho Device/Splint Interventions: Ordered, Application Applied splints per dr order during conscious sedation  Trinna PostMartinez, Tifanny Dollens J 02/12/2016, 1:17 AM
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
## 2016-02-12 NOTE — ED Notes (Signed)
Pt was in a lot of pain after CT, medicated her with admit orders

## 2016-02-12 NOTE — Progress Notes (Signed)
Left ankle fracture dislocation.  Post reduction x-rays still show significant displacement.   She will likely need an ex-fix and delayed ORIF.   Full consult to follow, keep npo for now.    Eulas PostLANDAU,[name] Skowron P, MD

## 2016-02-12 NOTE — ED Notes (Signed)
Report given to RN on 5N.

## 2016-02-13 ENCOUNTER — Inpatient Hospital Stay (HOSPITAL_COMMUNITY): Payer: Medicaid Other

## 2016-02-13 LAB — CBC
HEMATOCRIT: 25.4 % — AB (ref 36.0–46.0)
Hemoglobin: 8.1 g/dL — ABNORMAL LOW (ref 12.0–15.0)
MCH: 23.1 pg — ABNORMAL LOW (ref 26.0–34.0)
MCHC: 31.9 g/dL (ref 30.0–36.0)
MCV: 72.4 fL — ABNORMAL LOW (ref 78.0–100.0)
PLATELETS: 233 10*3/uL (ref 150–400)
RBC: 3.51 MIL/uL — ABNORMAL LOW (ref 3.87–5.11)
RDW: 13.7 % (ref 11.5–15.5)
WBC: 7.3 10*3/uL (ref 4.0–10.5)

## 2016-02-13 MED ORDER — ACETAMINOPHEN 500 MG PO TABS
1000.0000 mg | ORAL_TABLET | Freq: Three times a day (TID) | ORAL | Status: DC | PRN
  Administered 2016-02-13 – 2016-02-14 (×3): 1000 mg via ORAL
  Filled 2016-02-13 (×3): qty 2

## 2016-02-13 MED ORDER — OXYCODONE HCL 5 MG PO TABS
5.0000 mg | ORAL_TABLET | ORAL | Status: DC | PRN
  Administered 2016-02-13 – 2016-02-15 (×9): 15 mg via ORAL
  Filled 2016-02-13 (×9): qty 3

## 2016-02-13 MED ORDER — MORPHINE SULFATE (PF) 2 MG/ML IV SOLN
4.0000 mg | INTRAVENOUS | Status: DC | PRN
  Administered 2016-02-13 – 2016-02-15 (×10): 4 mg via INTRAVENOUS
  Filled 2016-02-13 (×10): qty 2
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
## 2016-02-13 NOTE — Progress Notes (Signed)
SPORTS MEDICINE AND JOINT REPLACEMENT  Georgena SpurlingStephen Lucey, MD   [facility] Bre[facility]in Zafar PA-C [address], [place], [place]  [address]                             [phone]   PROGRESS NOTE  Subjective:  negative for Chest Pain  negative for Shortness of Breath  negative for Nausea/Vomiting   negative for Calf Pain  negative for Bowel Movement   Tolerating Diet: yes         Patient reports pain as 5 on 0-10 scale.    Objective: Vital signs in last 24 hours:   Patient Vitals for the past 24 hrs:  BP Temp Temp src Pulse Resp SpO2  02/13/16 0414 (!) 115/57 mmHg 98.6 F (37 C) Oral (!) 114 18 95 %  02/12/16 2047 113/67 mmHg 99.1 F (37.3 C) Oral (!) 113 20 100 %  02/12/16 1520 131/71 mmHg 98.6 F (37 C) Oral (!) 107 20 100 %  02/12/16 1458 114/80 mmHg 98 F (36.7 C) - (!) 105 20 100 %  02/12/16 1445 123/79 mmHg - - (!) 108 (!) 21 100 %  02/12/16 1433 - 98.3 F (36.8 C) - - - -  02/12/16 1430 127/85 mmHg - - (!) 114 (!) 24 98 %    @flow {1959:LAST@   Intake/Output from previous day:   05/12 0701 - 05/13 0700 In: 1200 [I.V.:1200] Out: 100    Intake/Output this shift:       Intake/Output      05/12 0701 - 05/13 0700 05/13 0701 - 05/14 0700   P.O. 0    I.V. (mL/kg) 1200 (18.9)    Total Intake(mL/kg) 1200 (18.9)    Urine (mL/kg/hr) 0 (0)    Blood 100 (0.1)    Total Output 100     Net +1100          Urine Occurrence 5 x 2 x      LABORATORY DATA:  Recent Labs  02/10/16 1358 02/11/16 2300 02/11/16 2324 02/12/16 0537 02/13/16 0549  WBC 7.0 18.0*  --  12.0* 7.3  HGB  --  10.6* 13.3 9.4* 8.1*  HCT 36.0 33.2* 39.0 29.2* 25.4*  PLT 339 323  --  283 233    Recent Labs  02/11/16 2300 02/11/16 2324 02/12/16 0537  NA 136 139 139  K 3.4* 3.4* 3.6  CL 108 106 110  CO2 16*  --  17*  BUN 7 7 6   CREATININE 0.75 0.60 0.55  GLUCOSE 149* 149* 75  CALCIUM 8.9  --  8.5*   Lab Results  Component Value Date   INR 1.13 02/11/2016    Examination:  General  appearance: alert and no distress Extremities: extremities normal, atraumatic, no cyanosis or edema  Wound Exam: clean, dry, intact   Drainage:  None: wound tissue dry  Motor Exam: Quadriceps and Hamstrings Intact  Sensory Exam: Superficial Peroneal, Deep Peroneal and Tibial normal   Assessment:    1 Day Post-Op  Procedure(s) (LRB): OPEN REDUCTION INTERNAL FIXATION (ORIF) ANKLE FRACTURE (Left)  ADDITIONAL DIAGNOSIS:  Active Problems:   Bimalleolar ankle fracture  Acute Blood Loss Anemia   Plan: Physical Therapy as ordered Non Weight Bearing (NWB)  DVT Prophylaxis:  None  DISCHARGE PLAN: Home  Will follow patient through the weekend until MD orders discharge - probably monday         Guy SandiferColby Alan Jodeci Rini 02/13/2016, 1:58 PM
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
## 2016-02-14 ENCOUNTER — Inpatient Hospital Stay (HOSPITAL_COMMUNITY): Payer: Medicaid Other

## 2016-02-14 LAB — NUSWAB VG+, CANDIDA 6SP
ATOPOBIUM VAGINAE: HIGH {score} — AB
BVAB 2: HIGH {score} — AB
CANDIDA GLABRATA, NAA: NEGATIVE
CANDIDA LUSITANIAE, NAA: NEGATIVE
CANDIDA PARAPSILOSIS, NAA: NEGATIVE
CANDIDA TROPICALIS, NAA: NEGATIVE
Candida albicans, NAA: NEGATIVE
Candida krusei, NAA: NEGATIVE
Chlamydia trachomatis, NAA: NEGATIVE
MEGASPHAERA 1: HIGH {score} — AB
NEISSERIA GONORRHOEAE, NAA: NEGATIVE
Trich vag by NAA: NEGATIVE

## 2016-02-14 LAB — CBC
HEMATOCRIT: 25.4 % — AB (ref 36.0–46.0)
Hemoglobin: 8 g/dL — ABNORMAL LOW (ref 12.0–15.0)
MCH: 22.9 pg — ABNORMAL LOW (ref 26.0–34.0)
MCHC: 31.5 g/dL (ref 30.0–36.0)
MCV: 72.8 fL — AB (ref 78.0–100.0)
Platelets: 225 10*3/uL (ref 150–400)
RBC: 3.49 MIL/uL — AB (ref 3.87–5.11)
RDW: 13.6 % (ref 11.5–15.5)
WBC: 7.6 10*3/uL (ref 4.0–10.5)
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
## 2016-02-14 NOTE — Progress Notes (Signed)
Orthopedic Tech Progress Note Patient Details:  [name] [name] 1990-12-27 [id number]  Ortho Devices Type of Ortho Device: Ankle splint, Soft collar Ortho Device/Splint Location: c-collar to neck and RLE air cast Ortho Device/Splint Interventions: Ordered, Application   Jennye MoccasinHughes, Gohan Collister Craig 02/14/2016, 3:42 PM

## 2016-02-14 NOTE — Progress Notes (Signed)
Rehab Admissions Coordinator Note:  Patient was screened by Clois DupesBoyette, Okla Qazi Godwin for appropriateness for an Inpatient Acute Rehab Consult per OT recommendation. PT recommends HH.  At this time, we are recommending HH. Do not see medical necesity for a hospital rehab admission.   Clois DupesBoyette, Rosselyn Martha Godwin 02/14/2016, 9:41 AM  I can be reached at [phone].
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
## 2016-02-14 NOTE — Progress Notes (Signed)
OT Cancellation Note  Patient Details Name: [name] [name] MRN: [id number] DOB: 1991-02-06   Cancelled Treatment:    Reason Eval/Treat Not Completed: Other (comment). Currently waiting on reading of X-rays for right foot. Will attempt treatment at later time/date.  [name], [name] [phone] 02/14/2016, 1:16 PM
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
## 2016-02-15 ENCOUNTER — Inpatient Hospital Stay (HOSPITAL_COMMUNITY): Payer: Medicaid Other

## 2016-02-15 ENCOUNTER — Encounter (HOSPITAL_COMMUNITY): Payer: Self-pay | Admitting: Orthopedic Surgery

## 2016-02-15 ENCOUNTER — Other Ambulatory Visit: Payer: Self-pay | Admitting: Certified Nurse Midwife

## 2016-02-15 ENCOUNTER — Telehealth: Payer: Self-pay | Admitting: *Deleted

## 2016-02-15 ENCOUNTER — Encounter: Payer: Self-pay | Admitting: *Deleted

## 2016-02-15 DIAGNOSIS — N760 Acute vaginitis: Principal | ICD-10-CM

## 2016-02-15 DIAGNOSIS — D62 Acute posthemorrhagic anemia: Secondary | ICD-10-CM | POA: Diagnosis not present

## 2016-02-15 DIAGNOSIS — S060XAA Concussion with loss of consciousness status unknown, initial encounter: Secondary | ICD-10-CM | POA: Diagnosis present

## 2016-02-15 DIAGNOSIS — S060X9A Concussion with loss of consciousness of unspecified duration, initial encounter: Secondary | ICD-10-CM | POA: Diagnosis present

## 2016-02-15 DIAGNOSIS — B9689 Other specified bacterial agents as the cause of diseases classified elsewhere: Secondary | ICD-10-CM

## 2016-02-15 MED ORDER — CLINDAMYCIN PHOSPHATE 2 % VA CREA: 1.0000 | TOPICAL_CREAM | Freq: Every day | VAGINAL | Status: DC

## 2016-02-15 MED ORDER — MORPHINE SULFATE (PF) 2 MG/ML IV SOLN
2.0000 mg | INTRAVENOUS | Status: DC | PRN
  Administered 2016-02-15 – 2016-02-16 (×3): 2 mg via INTRAVENOUS
  Filled 2016-02-15 (×3): qty 1

## 2016-02-15 MED ORDER — HYDROCODONE-ACETAMINOPHEN 10-325 MG PO TABS
0.5000 | ORAL_TABLET | ORAL | Status: DC | PRN
  Administered 2016-02-15 – 2016-02-16 (×5): 2 via ORAL
  Administered 2016-02-16: 1 via ORAL
  Administered 2016-02-17 (×4): 2 via ORAL
  Filled 2016-02-15 (×5): qty 2
  Filled 2016-02-15: qty 1
  Filled 2016-02-15 (×4): qty 2

## 2016-02-15 MED ORDER — PRENATAL MULTIVITAMIN CH
1.0000 | ORAL_TABLET | Freq: Every day | ORAL | Status: DC
  Administered 2016-02-15 – 2016-02-17 (×3): 1 via ORAL
  Filled 2016-02-15 (×3): qty 1

## 2016-02-15 MED ORDER — COMPLETENATE 29-1 MG PO CHEW
1.0000 | CHEWABLE_TABLET | Freq: Every day | ORAL | Status: DC
  Filled 2016-02-15: qty 1

## 2016-02-15 MED ORDER — ENOXAPARIN SODIUM 40 MG/0.4ML ~~LOC~~ SOLN
40.0000 mg | SUBCUTANEOUS | Status: DC
  Filled 2016-02-15: qty 0.4

## 2016-02-15 NOTE — Progress Notes (Signed)
1100 Dr. Clearance CootsHarper notified of order previously placed by Earney HamburgMichael Jeffrey, PA-C for FHTs QS on this 24yo G3P2 @ 11.[redacted] wks GA.  Per Dr. Clearance CootsHarper, who is the patient's OB-GYN, there is no need for [facility] on this non-viable fetus.
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
## 2016-02-15 NOTE — Anesthesia Postprocedure Evaluation (Signed)
Anesthesia Post Note  Patient: [name] [name]  Procedure(s) Performed: Procedure(s) (LRB): OPEN REDUCTION INTERNAL FIXATION (ORIF) ANKLE FRACTURE (Left)  Patient location during evaluation: PACU Anesthesia Type: General Level of consciousness: awake and alert and patient cooperative Pain management: pain level controlled Vital Signs Assessment: post-procedure vital signs reviewed and stable Respiratory status: spontaneous breathing and respiratory function stable Cardiovascular status: stable Anesthetic complications: no    Last Vitals:  Filed Vitals:   02/14/16 2007 02/15/16 0508  BP: 110/53 122/60  Pulse: 115 102  Temp: 37.4 C 37.2 C  Resp: 16 16    Last Pain:  Filed Vitals:   02/15/16 1141  PainSc: 10-Worst pain ever                 Georgette Helmer S
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
## 2016-02-16 MED ORDER — FENTANYL 25 MCG/HR TD PT72
25.0000 ug | MEDICATED_PATCH | TRANSDERMAL | Status: DC
  Administered 2016-02-16: 25 ug via TRANSDERMAL
  Filled 2016-02-16: qty 1

## 2016-02-16 MED ORDER — HYDROCODONE-ACETAMINOPHEN 10-325 MG PO TABS: 0.5000 | ORAL_TABLET | ORAL | Status: DC | PRN

## 2016-02-16 MED ORDER — PRENATAL MULTIVITAMIN CH: 1.0000 | ORAL_TABLET | Freq: Every day | ORAL | Status: DC

## 2016-02-16 MED ORDER — FENTANYL 25 MCG/HR TD PT72: MEDICATED_PATCH | TRANSDERMAL | Status: DC

## 2016-02-16 NOTE — Care Management Note (Addendum)
Case Management Note  Patient Details  Name: [name] [name] MRN: [id number] Date of Birth: May 08, 1991  Subjective/Objective:  Pt medically stable for dc home today.  She needs home therapies, if possible and DME for home.                    Action/Plan: Met with pt to discuss home arrangements.  Pt states she doesn't think she is ready to leave the hospital, because she cannot walk.  Pt is NWB on Lt side, and has cam walker ordered for Rt side.  She states that her boyfriend has to work 7p-7am, and she has 2 small children.  The friend that she lives with is having issue with her bringing a large amount of equipment to the house.  She states that her mother is a Administrator, is on the road, and cannot help her.  Explained to pt that she has been discharged, and that she needs to find someone to assist her when she goes home.  She asks that I come back later to see if she is able to find help.    Expected Discharge Date:                  Expected Discharge Plan:  [place]  In-House Referral:     Discharge planning Services  CM Consult  Post Acute Care Choice:    Choice offered to:     DME Arranged:    DME Agency:     HH Arranged:    [place] Agency:     Status of Service:  In process, will continue to follow  Medicare Important Message Given:    Date Medicare IM Given:    Medicare IM give by:    Date Additional Medicare IM Given:    Additional Medicare Important Message give by:     If discussed at [place] of Stay Meetings, dates discussed:    Additional Comments:  Reinaldo Raddle, RN, BSN  Trauma/Neuro ICU Case Manager [phone]
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
## 2016-02-16 NOTE — Progress Notes (Signed)
Met with pt to see if she was able to find assistance for home.  Sister now present in the room.  Pt states she was unable to find any help for home, though sister says she will help her out.  She states "I can't believe you all are going to discharge me, and I can't walk."  "Why can't I go to rehab?"   Explained to pt that she did not have the medical necessity needed for CIR, and that rehab is not an option.  CSW consulted to investigate the possibility of SNF for rehab, but this is unlikely.    Reinaldo Raddle, RN, BSN  Trauma/Neuro ICU Case Manager [phone]
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
## 2016-02-16 NOTE — Discharge Instructions (Addendum)
Orthopaedic Trauma Service Discharge Instructions,   General Discharge Instructions  WEIGHT BEARING STATUS: Nonweightbearing Left Leg, Weigthbearing as tolerated Right leg with CAM boot  RANGE OF MOTION/ACTIVITY: Range of motion as tolerated Right ankle. No range of motion L ankle   Wound Care: Do not remove splint on Left leg. Local wound care to Right foot. See instructions below. Ok to remove ace wrap on right ankle   PAIN MEDICATION USE AND EXPECTATIONS  You have likely been given narcotic medications to help control your pain.  After a traumatic event that results in an fracture (broken bone) with or without surgery, it is ok to use narcotic pain medications to help control one's pain.  We understand that everyone responds to pain differently and each individual patient will be evaluated on a regular basis for the continued need for narcotic medications. Ideally, narcotic medication use should last no more than 6-8 weeks (coinciding with fracture healing).   As a patient it is your responsibility as well to monitor narcotic medication use and report the amount and frequency you use these medications when you come to your office visit.   We would also advise that if you are using narcotic medications, you should take a dose prior to therapy to maximize you participation.  IF YOU ARE ON NARCOTIC MEDICATIONS IT IS NOT PERMISSIBLE TO OPERATE A MOTOR VEHICLE (MOTORCYCLE/CAR/TRUCK/MOPED) OR HEAVY MACHINERY DO NOT MIX NARCOTICS WITH OTHER CNS (CENTRAL NERVOUS SYSTEM) DEPRESSANTS SUCH AS ALCOHOL  Diet: as you were eating previously.  Can use over the counter stool softeners and bowel preparations, such as Miralax, to help with bowel movements.  Narcotics can be constipating.  Be sure to drink plenty of fluids    STOP SMOKING OR USING NICOTINE PRODUCTS!!!!  As discussed nicotine severely impairs your body's ability to heal surgical and traumatic wounds but also impairs bone healing.  Wounds and  bone heal by forming microscopic blood vessels (angiogenesis) and nicotine is a vasoconstrictor (essentially, shrinks blood vessels).  Therefore, if vasoconstriction occurs to these microscopic blood vessels they essentially disappear and are unable to deliver necessary nutrients to the healing tissue.  This is one modifiable factor that you can do to dramatically increase your chances of healing your injury.    (This means no smoking, no nicotine gum, patches, etc)  DO NOT USE NONSTEROIDAL ANTI-INFLAMMATORY DRUGS (NSAID'S)  Using products such as Advil (ibuprofen), Aleve (naproxen), Motrin (ibuprofen) for additional pain control during fracture healing can delay and/or prevent the healing response.  If you would like to take over the counter (OTC) medication, Tylenol (acetaminophen) is ok.  However, some narcotic medications that are given for pain control contain acetaminophen as well. Therefore, you should not exceed more than 4000 mg of tylenol in a day if you do not have liver disease.  Also note that there are may OTC medicines, such as cold medicines and allergy medicines that my contain tylenol as well.  If you have any questions about medications and/or interactions please ask your doctor/PA or your pharmacist.      ICE AND ELEVATE INJURED/OPERATIVE EXTREMITY  Using ice and elevating the injured extremity above your heart can help with swelling and pain control.  Icing in a pulsatile fashion, such as 20 minutes on and 20 minutes off, can be followed.    Do not place ice directly on skin. Make sure there is a barrier between to skin and the ice pack.    Using frozen items such as frozen peas  works well as the conform nicely to the are that needs to be iced.  USE AN ACE WRAP OR TED HOSE FOR SWELLING CONTROL  In addition to icing and elevation, Ace wraps or TED hose are used to help limit and resolve swelling.  It is recommended to use Ace wraps or TED hose until you are informed to stop.    When  using Ace Wraps start the wrapping distally (farthest away from the body) and wrap proximally (closer to the body)   Example: If you had surgery on your leg or thing and you do not have a splint on, start the ace wrap at the toes and work your way up to the thigh        If you had surgery on your upper extremity and do not have a splint on, start the ace wrap at your fingers and work your way up to the upper arm  IF YOU ARE IN A SPLINT OR CAST DO NOT REMOVE IT FOR ANY REASON   If your splint gets wet for any reason please contact the office immediately. You may shower in your splint or cast as long as you keep it dry.  This can be done by wrapping in a cast cover or garbage back (or similar)  Do Not stick any thing down your splint or cast such as pencils, money, or hangers to try and scratch yourself with.  If you feel itchy take benadryl as prescribed on the bottle for itching  IF YOU ARE IN A CAM BOOT (BLACK BOOT)  You may remove boot periodically. Perform daily dressing changes as noted below.  Wash the liner of the boot regularly and wear a sock when wearing the boot. It is recommended that you sleep in the boot until told otherwise  CALL THE OFFICE WITH ANY QUESTIONS OR CONCERTS: [phone]     Discharge Wound Care Instructions  Do NOT apply any ointments, solutions or lotions to pin sites or surgical wounds.  These prevent needed drainage and even though solutions like hydrogen peroxide kill bacteria, they also damage cells lining the pin sites that help fight infection.  Applying lotions or ointments can keep the wounds moist and can cause them to breakdown and open up as well. This can increase the risk for infection. When in doubt call the office.  Surgical incisions should be dressed daily.  If any drainage is noted, use one layer of adaptic, then gauze, Kerlix, and an ace wrap.  Once the incision is completely dry and without drainage, it may be left open to air out.  Showering  may begin 36-48 hours later.  Cleaning gently with soap and water.  Traumatic wounds should be dressed daily as well.    One layer of adaptic, gauze, Kerlix, then ace wrap.  The adaptic can be discontinued once the draining has ceased    If you have a wet to dry dressing: wet the gauze with saline the squeeze as much saline out so the gauze is moist (not soaking wet), place moistened gauze over wound, then place a dry gauze over the moist one, followed by Kerlix wrap, then ace wrap.   No driving while taking hydrocodone.

## 2016-02-16 NOTE — Progress Notes (Signed)
Orthopedic Tech Progress Note Patient Details:  [name] [name] [birth year] [id number]  Ortho Devices Type of Ortho Device: CAM walker Ortho Device/Splint Location: c-collar to neck and RLE air cast Ortho Device/Splint Interventions: Application   Saul FordyceJennifer C Lyrik Dockstader 02/16/2016, 3:03 PM
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
## 2016-02-16 NOTE — Progress Notes (Signed)
Spoke with pt, her sister and boyfriend to discuss discharge issues.   Per CSW, skilled nursing facility will not be approved.   Pt/family made aware of this, and they are quite upset.  They are requesting to speak with MD or PA.  Notified M. Leotis ShamesJeffery, PA to come and speak with pt and family regarding dc.    Quintella BatonJulie W. Jerrard Bradburn, RN, BSN  Trauma/Neuro ICU Case Manager [phone]
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
## 2016-02-17 ENCOUNTER — Encounter (HOSPITAL_COMMUNITY): Payer: Self-pay | Admitting: Orthopedic Surgery

## 2016-02-17 ENCOUNTER — Other Ambulatory Visit: Payer: Self-pay | Admitting: Certified Nurse Midwife

## 2016-02-17 LAB — PRENATAL PROFILE I(LABCORP)
Antibody Screen: NEGATIVE
BASOS ABS: 0 10*3/uL (ref 0.0–0.2)
BASOS: 0 %
EOS (ABSOLUTE): 0.1 10*3/uL (ref 0.0–0.4)
Eos: 1 %
HEMATOCRIT: 36 % (ref 34.0–46.6)
HEMOGLOBIN: 11.4 g/dL (ref 11.1–15.9)
Hepatitis B Surface Ag: NEGATIVE
Immature Grans (Abs): 0 10*3/uL (ref 0.0–0.1)
Immature Granulocytes: 0 %
Lymphocytes Absolute: 1.9 10*3/uL (ref 0.7–3.1)
Lymphs: 27 %
MCH: 23.2 pg — ABNORMAL LOW (ref 26.6–33.0)
MCHC: 31.7 g/dL (ref 31.5–35.7)
MCV: 73 fL — AB (ref 79–97)
MONOS ABS: 0.6 10*3/uL (ref 0.1–0.9)
Monocytes: 9 %
NEUTROS ABS: 4.4 10*3/uL (ref 1.4–7.0)
Neutrophils: 63 %
PLATELETS: 339 10*3/uL (ref 150–379)
RBC: 4.92 x10E6/uL (ref 3.77–5.28)
RDW: 15.6 % — AB (ref 12.3–15.4)
RPR: NONREACTIVE
Rh Factor: POSITIVE
Rubella Antibodies, IGG: 3.17 index (ref >0.99)
WBC: 7 10*3/uL (ref 3.4–10.8)

## 2016-02-17 LAB — MATERNIT21 PLUS CORE+SCA
CHROMOSOME 21: NEGATIVE
Chromosome 13: NEGATIVE
Chromosome 18: NEGATIVE
PDF: 0
Y CHROMOSOME: NOT DETECTED

## 2016-02-17 LAB — HEMOGLOBINOPATHY EVALUATION
HGB A: 97.7 % (ref 94.0–98.0)
HGB C: 0 %
HGB S: 0 %
Hemoglobin A2 Quantitation: 2.3 % (ref 0.7–3.1)
Hemoglobin F Quantitation: 0 % (ref 0.0–2.0)

## 2016-02-17 LAB — VARICELLA ZOSTER ANTIBODY, IGG: Varicella zoster IgG: <135 index — ABNORMAL LOW (ref >165)

## 2016-02-17 LAB — HIV ANTIBODY (ROUTINE TESTING W REFLEX): HIV Screen 4th Generation wRfx: NONREACTIVE

## 2016-02-17 LAB — TSH: TSH: 0.912 u[IU]/mL (ref 0.450–4.500)

## 2016-02-17 MED ORDER — MAGNESIUM CITRATE PO SOLN
1.0000 | Freq: Once | ORAL | Status: AC
  Administered 2016-02-17: 1 via ORAL
  Filled 2016-02-17: qty 296
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
## 2016-02-17 NOTE — Care Management Note (Addendum)
Case Management Note  Patient Details  Name: [name] [name] MRN: [id number] Date of Birth: Jul 14, 1991  Subjective/Objective:    Pt for dc home today.  She states her friend has agreed for her to dc to her home as prior to admission.  She has progressed to where she does not need hospital bed, per OT.                  Action/Plan: [facility].  Pt requests RW, 3 in 1, and wheelchair with cushion only.  She states boyfriend and other friends/family members will provide care at dc.  Pt does not have qualifying diagnosis for home therapies with Medicaid as payor.  Pt agreeable to dc plan and DME for home.   DME to be delivered to pt's room prior to dc home.    Expected Discharge Date:    02/17/2016              Expected Discharge Plan:  Home w Home Health Services  In-House Referral:     Discharge planning Services  CM Consult  Post Acute Care Choice:    Choice offered to:     DME Arranged:  3-N-1, Walker rolling, Wheelchair manual DME Agency:  [facility]  HH Arranged:    HH Agency:     Status of Service:  Completed, signed off  Medicare Important Message Given:    Date Medicare IM Given:    Medicare IM give by:    Date Additional Medicare IM Given:    Additional Medicare Important Message give by:     If discussed at Long Length of Stay Meetings, dates discussed:    Additional Comments:  Quintella BatonJulie W. Krishawna Stiefel, RN, BSN  Trauma/Neuro ICU Case Manager [phone]
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
## 2016-02-17 NOTE — Progress Notes (Signed)
[name] CunasErika [name] to be D/C'd Home per MD order.  Discussed with the patient and all questions fully answered.  VSS, Skin clean, and dry. IV catheter discontinued intact. Site without signs and symptoms of complications.  An After Visit Summary was printed and given to the patient. Patient received prescription.  D/c education completed with patient/family including follow up instructions, medication list, d/c activities limitations if indicated, with other d/c instructions as indicated by MD - patient able to verbalize understanding, all questions fully answered.   Patient instructed to return to ED, call 911, or call MD for any changes in condition.   Patient escorted via WC, and D/C home via private auto.  [name] [name] 02/17/2016 2:30 PM
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
## 2016-02-19 ENCOUNTER — Telehealth: Payer: Self-pay | Admitting: *Deleted

## 2016-02-19 ENCOUNTER — Encounter: Payer: Self-pay | Admitting: Obstetrics

## 2016-02-19 ENCOUNTER — Ambulatory Visit (INDEPENDENT_AMBULATORY_CARE_PROVIDER_SITE_OTHER): Payer: Medicaid Other | Admitting: Obstetrics

## 2016-02-19 VITALS — BP 122/74 | HR 103

## 2016-02-19 DIAGNOSIS — Z3492 Encounter for supervision of normal pregnancy, unspecified, second trimester: Secondary | ICD-10-CM

## 2016-02-19 LAB — POCT URINALYSIS DIPSTICK
Bilirubin, UA: NEGATIVE
Blood, UA: NEGATIVE
GLUCOSE UA: NEGATIVE
Ketones, UA: NEGATIVE
LEUKOCYTES UA: NEGATIVE
Nitrite, UA: NEGATIVE
Protein, UA: NEGATIVE
Spec Grav, UA: 1.015
UROBILINOGEN UA: NEGATIVE
pH, UA: 6
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
## 2016-02-19 NOTE — Telephone Encounter (Signed)
PA for Diclegis has been submitted and approved. Pharmacy aware.
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
## 2016-02-23 NOTE — Telephone Encounter (Signed)
Attempted to call patient, not able to accept calls at this time is what the phone number stated.  She should be able to come for her ROB appointment that is scheduled in June.  Ortho should be able to manage her ankle fracture.

## 2016-02-25 ENCOUNTER — Encounter: Payer: Self-pay | Admitting: *Deleted

## 2016-03-09 ENCOUNTER — Encounter: Payer: Medicaid Other | Admitting: Certified Nurse Midwife

## 2016-03-18 ENCOUNTER — Ambulatory Visit (INDEPENDENT_AMBULATORY_CARE_PROVIDER_SITE_OTHER): Payer: Medicaid Other | Admitting: Certified Nurse Midwife

## 2016-03-18 VITALS — BP 115/72 | HR 100 | Temp 98.2°F

## 2016-03-18 DIAGNOSIS — S82892S Other fracture of left lower leg, sequela: Secondary | ICD-10-CM

## 2016-03-18 DIAGNOSIS — Z3492 Encounter for supervision of normal pregnancy, unspecified, second trimester: Secondary | ICD-10-CM

## 2016-03-18 DIAGNOSIS — Z3482 Encounter for supervision of other normal pregnancy, second trimester: Secondary | ICD-10-CM

## 2016-03-18 LAB — POCT URINALYSIS DIPSTICK
BILIRUBIN UA: NEGATIVE
GLUCOSE UA: NEGATIVE
KETONES UA: NEGATIVE
Nitrite, UA: NEGATIVE
Protein, UA: NEGATIVE
RBC UA: NEGATIVE
SPEC GRAV UA: 1.02
Urobilinogen, UA: NEGATIVE
pH, UA: 6.5
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
## 2016-03-25 ENCOUNTER — Telehealth: Payer: Self-pay

## 2016-03-25 NOTE — Telephone Encounter (Signed)
Patient said to call [phone] - she has pregnancy medicaid and it won't cover orthopedics - told her to talk to caseworker to get changed To regular medicaid

## 2016-04-07 ENCOUNTER — Ambulatory Visit (INDEPENDENT_AMBULATORY_CARE_PROVIDER_SITE_OTHER): Payer: Medicaid Other

## 2016-04-07 DIAGNOSIS — Z3482 Encounter for supervision of other normal pregnancy, second trimester: Secondary | ICD-10-CM

## 2016-04-07 DIAGNOSIS — Z36 Encounter for antenatal screening of mother: Secondary | ICD-10-CM | POA: Diagnosis not present

## 2016-04-15 ENCOUNTER — Ambulatory Visit (INDEPENDENT_AMBULATORY_CARE_PROVIDER_SITE_OTHER): Payer: Medicaid Other | Admitting: Certified Nurse Midwife

## 2016-04-15 VITALS — BP 114/67 | HR 96 | Temp 98.1°F | Wt 141.5 lb

## 2016-04-15 DIAGNOSIS — Z3481 Encounter for supervision of other normal pregnancy, first trimester: Secondary | ICD-10-CM

## 2016-04-15 LAB — POCT URINALYSIS DIPSTICK
Bilirubin, UA: NEGATIVE
GLUCOSE UA: NORMAL
Ketones, UA: NEGATIVE
NITRITE UA: NEGATIVE
Protein, UA: NEGATIVE
RBC UA: NEGATIVE
Spec Grav, UA: 1.01
UROBILINOGEN UA: NEGATIVE
pH, UA: 6
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
## 2016-05-13 ENCOUNTER — Ambulatory Visit (INDEPENDENT_AMBULATORY_CARE_PROVIDER_SITE_OTHER): Payer: Medicaid Other | Admitting: Certified Nurse Midwife

## 2016-05-13 VITALS — BP 113/71 | HR 87 | Wt 151.5 lb

## 2016-05-13 DIAGNOSIS — Z3482 Encounter for supervision of other normal pregnancy, second trimester: Secondary | ICD-10-CM

## 2016-05-13 LAB — POCT URINALYSIS DIPSTICK
Bilirubin, UA: NEGATIVE
Glucose, UA: NEGATIVE
Ketones, UA: NEGATIVE
LEUKOCYTES UA: NEGATIVE
NITRITE UA: NEGATIVE
PH UA: 7.5
PROTEIN UA: NEGATIVE
RBC UA: NEGATIVE
Spec Grav, UA: 1.01
UROBILINOGEN UA: NEGATIVE
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
## 2016-05-17 ENCOUNTER — Other Ambulatory Visit: Payer: Self-pay | Admitting: Certified Nurse Midwife

## 2016-06-10 ENCOUNTER — Other Ambulatory Visit: Payer: Medicaid Other

## 2016-06-10 ENCOUNTER — Ambulatory Visit (INDEPENDENT_AMBULATORY_CARE_PROVIDER_SITE_OTHER): Payer: Medicaid Other | Admitting: Advanced Practice Midwife

## 2016-06-10 VITALS — BP 112/79 | HR 101 | Temp 98.7°F | Wt 162.3 lb

## 2016-06-10 DIAGNOSIS — Z23 Encounter for immunization: Secondary | ICD-10-CM

## 2016-06-10 DIAGNOSIS — Z3481 Encounter for supervision of other normal pregnancy, first trimester: Secondary | ICD-10-CM

## 2016-06-10 DIAGNOSIS — O2693 Pregnancy related conditions, unspecified, third trimester: Secondary | ICD-10-CM

## 2016-06-10 DIAGNOSIS — O26893 Other specified pregnancy related conditions, third trimester: Principal | ICD-10-CM

## 2016-06-10 DIAGNOSIS — M545 Low back pain: Secondary | ICD-10-CM

## 2016-06-10 MED ORDER — COMFORT FIT MATERNITY SUPP SM MISC: 1.0000 | Freq: Every day | 0 refills | Status: DC

## 2016-06-10 MED ORDER — TETANUS-DIPHTH-ACELL PERTUSSIS 5-2.5-18.5 LF-MCG/0.5 IM SUSP
0.5000 mL | Freq: Once | INTRAMUSCULAR | Status: AC
  Administered 2016-06-10: 0.5 mL via INTRAMUSCULAR

## 2016-06-10 NOTE — Addendum Note (Signed)
Addended by: Marya LandryFOSTER, SUZANNE D on: 06/10/2016 10:27 AM   Modules accepted: Orders
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
## 2016-06-11 LAB — CBC
HEMOGLOBIN: 9.8 g/dL — AB (ref 11.1–15.9)
Hematocrit: 29.7 % — ABNORMAL LOW (ref 34.0–46.6)
MCH: 23.4 pg — ABNORMAL LOW (ref 26.6–33.0)
MCHC: 33 g/dL (ref 31.5–35.7)
MCV: 71 fL — ABNORMAL LOW (ref 79–97)
PLATELETS: 292 10*3/uL (ref 150–379)
RBC: 4.19 x10E6/uL (ref 3.77–5.28)
RDW: 15.5 % — AB (ref 12.3–15.4)
WBC: 8.3 10*3/uL (ref 3.4–10.8)

## 2016-06-11 LAB — HIV ANTIBODY (ROUTINE TESTING W REFLEX): HIV SCREEN 4TH GENERATION: NONREACTIVE

## 2016-06-11 LAB — GLUCOSE TOLERANCE, 2 HOURS W/ 1HR
GLUCOSE, 1 HOUR: 70 mg/dL (ref 65–179)
Glucose, 2 hour: 59 mg/dL — ABNORMAL LOW (ref 65–152)
Glucose, Fasting: 70 mg/dL (ref 65–91)

## 2016-06-11 LAB — RPR: RPR: NONREACTIVE

## 2016-06-16 ENCOUNTER — Encounter: Payer: Self-pay | Admitting: Advanced Practice Midwife

## 2016-06-16 DIAGNOSIS — O99013 Anemia complicating pregnancy, third trimester: Secondary | ICD-10-CM | POA: Insufficient documentation

## 2016-06-16 DIAGNOSIS — O99019 Anemia complicating pregnancy, unspecified trimester: Secondary | ICD-10-CM | POA: Insufficient documentation

## 2016-06-21 ENCOUNTER — Telehealth: Payer: Self-pay | Admitting: *Deleted

## 2016-06-21 NOTE — Telephone Encounter (Signed)
Patient complains of SOB/ muscle tightness- she doesn't want to go to the hospital. 9/15 10:26 Call to patient- patient states she has SOB with exertion. Her arms hurt and she feels fatigue. Discussed her medical finds- heart murmur. She does not have history of asthma,allergy or cold at this time. Advised patient she should get checked for her SOB. She could have underlying problems that the pregnancy is bring out. She states this has happened with all of her previous pregnancies and it is getting worse. She does have an appointment coming up on 9/19. Told patient if she does not go to the ed to get checked she should at least let her provider know at her appointment so orders can be placed for testing.

## 2016-06-27 ENCOUNTER — Ambulatory Visit (INDEPENDENT_AMBULATORY_CARE_PROVIDER_SITE_OTHER): Payer: Medicaid Other | Admitting: Family

## 2016-06-27 VITALS — BP 121/73 | HR 91 | Temp 98.7°F | Wt 165.0 lb

## 2016-06-27 DIAGNOSIS — N898 Other specified noninflammatory disorders of vagina: Secondary | ICD-10-CM

## 2016-06-27 DIAGNOSIS — O99013 Anemia complicating pregnancy, third trimester: Secondary | ICD-10-CM

## 2016-06-27 DIAGNOSIS — O26893 Other specified pregnancy related conditions, third trimester: Secondary | ICD-10-CM

## 2016-06-27 DIAGNOSIS — Z3483 Encounter for supervision of other normal pregnancy, third trimester: Secondary | ICD-10-CM | POA: Diagnosis not present

## 2016-06-27 DIAGNOSIS — Z3493 Encounter for supervision of normal pregnancy, unspecified, third trimester: Secondary | ICD-10-CM

## 2016-06-27 NOTE — Addendum Note (Signed)
Addended by: Marlis EdelsonKARIM, Alyus Mofield N on: 06/27/2016 03:34 PM   Modules accepted: Orders

## 2016-06-27 NOTE — Addendum Note (Signed)
Addended by: Natale MilchSTALLING, BRITTANY D on: 06/27/2016 02:48 PM   Modules accepted: Orders

## 2016-06-27 NOTE — Progress Notes (Signed)
Patient states that she has been feeling irritability in her back and having SOB, but not daily. Patient also states that she has discharge with a slight odor.

## 2016-06-27 NOTE — Patient Instructions (Signed)
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
## 2016-06-29 ENCOUNTER — Other Ambulatory Visit: Payer: Self-pay | Admitting: Family

## 2016-06-29 DIAGNOSIS — N760 Acute vaginitis: Principal | ICD-10-CM

## 2016-06-29 DIAGNOSIS — B9689 Other specified bacterial agents as the cause of diseases classified elsewhere: Secondary | ICD-10-CM

## 2016-06-29 LAB — VAGINITIS/VAGINOSIS, DNA PROBE
Candida Species: POSITIVE — AB
Gardnerella vaginalis: POSITIVE — AB
Trichomonas vaginosis: NEGATIVE

## 2016-06-29 MED ORDER — CLINDAMYCIN HCL 300 MG PO CAPS: 300.0000 mg | ORAL_CAPSULE | Freq: Two times a day (BID) | ORAL | 0 refills | Status: DC

## 2016-07-05 ENCOUNTER — Encounter: Payer: Self-pay | Admitting: Family

## 2016-07-05 ENCOUNTER — Other Ambulatory Visit: Payer: Self-pay | Admitting: Family

## 2016-07-05 DIAGNOSIS — B9689 Other specified bacterial agents as the cause of diseases classified elsewhere: Secondary | ICD-10-CM | POA: Insufficient documentation

## 2016-07-05 DIAGNOSIS — N760 Acute vaginitis: Principal | ICD-10-CM

## 2016-07-05 DIAGNOSIS — B379 Candidiasis, unspecified: Secondary | ICD-10-CM

## 2016-07-05 MED ORDER — TERCONAZOLE 0.4 % VA CREA: 1.0000 | TOPICAL_CREAM | Freq: Every day | VAGINAL | 0 refills | Status: DC

## 2016-07-06 ENCOUNTER — Telehealth: Payer: Self-pay | Admitting: *Deleted

## 2016-07-06 ENCOUNTER — Other Ambulatory Visit: Payer: Self-pay | Admitting: Certified Nurse Midwife

## 2016-07-06 NOTE — Telephone Encounter (Signed)
Called pt and informed her of test results showing +BV and yeast. Rx has been prescribed. Pt confirmed that she picked up the medications yesterday and has started taking. She voiced understanding and had no questions.

## 2016-07-08 LAB — URINE CULTURE, OB REFLEX

## 2016-07-08 LAB — CULTURE, OB URINE

## 2016-07-12 ENCOUNTER — Ambulatory Visit (INDEPENDENT_AMBULATORY_CARE_PROVIDER_SITE_OTHER): Payer: Medicaid Other | Admitting: Certified Nurse Midwife

## 2016-07-12 VITALS — BP 115/74 | HR 103

## 2016-07-12 DIAGNOSIS — Z3483 Encounter for supervision of other normal pregnancy, third trimester: Secondary | ICD-10-CM | POA: Diagnosis not present
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
## 2016-07-25 ENCOUNTER — Encounter (HOSPITAL_COMMUNITY): Payer: Self-pay | Admitting: Student

## 2016-07-25 ENCOUNTER — Inpatient Hospital Stay (HOSPITAL_COMMUNITY)
Admission: AD | Admit: 2016-07-25 | Discharge: 2016-07-25 | Disposition: A | Discharge status: Home / Self Care | Payer: Medicaid Other | Source: Ambulatory Visit | Attending: Family Medicine | Admitting: Family Medicine

## 2016-07-25 DIAGNOSIS — O26893 Other specified pregnancy related conditions, third trimester: Secondary | ICD-10-CM

## 2016-07-25 DIAGNOSIS — Z3A34 34 weeks gestation of pregnancy: Secondary | ICD-10-CM | POA: Insufficient documentation

## 2016-07-25 DIAGNOSIS — O99333 Smoking (tobacco) complicating pregnancy, third trimester: Secondary | ICD-10-CM | POA: Diagnosis not present

## 2016-07-25 DIAGNOSIS — R12 Heartburn: Secondary | ICD-10-CM | POA: Insufficient documentation

## 2016-07-25 DIAGNOSIS — R102 Pelvic and perineal pain: Secondary | ICD-10-CM | POA: Diagnosis present

## 2016-07-25 DIAGNOSIS — O479 False labor, unspecified: Secondary | ICD-10-CM

## 2016-07-25 DIAGNOSIS — O4703 False labor before 37 completed weeks of gestation, third trimester: Secondary | ICD-10-CM | POA: Diagnosis not present

## 2016-07-25 DIAGNOSIS — F17210 Nicotine dependence, cigarettes, uncomplicated: Secondary | ICD-10-CM | POA: Insufficient documentation

## 2016-07-25 LAB — URINALYSIS, ROUTINE W REFLEX MICROSCOPIC
BILIRUBIN URINE: NEGATIVE
Glucose, UA: NEGATIVE mg/dL
HGB URINE DIPSTICK: NEGATIVE
KETONES UR: NEGATIVE mg/dL
NITRITE: NEGATIVE
PH: 7 (ref 5.0–8.0)
Protein, ur: NEGATIVE mg/dL
Specific Gravity, Urine: 1.02 (ref 1.005–1.030)

## 2016-07-25 LAB — URINE MICROSCOPIC-ADD ON
Bacteria, UA: NONE SEEN
RBC / HPF: NONE SEEN RBC/hpf (ref 0–5)

## 2016-07-25 MED ORDER — RANITIDINE HCL 150 MG PO TABS: 150.0000 mg | ORAL_TABLET | Freq: Two times a day (BID) | ORAL | 0 refills | Status: DC

## 2016-07-25 MED ORDER — GI COCKTAIL ~~LOC~~
30.0000 mL | Freq: Once | ORAL | Status: AC
  Administered 2016-07-25: 30 mL via ORAL
  Filled 2016-07-25: qty 30

## 2016-07-25 NOTE — MAU Provider Note (Signed)
History     CSN: [id number]  Arrival date and time: 07/25/16 1649   First Provider Initiated Contact with Patient 07/25/16 1739      Chief Complaint  Patient presents with  . Pelvic Pain  . Abdominal Pain   HPI [name] [name] is a [age] y.o. G3P2002 at 653w4d who presents with abdominal & pelvic pain. Pt reports lower pelvic pain & pressure that comes & goes today. Rates pain 8/10. Has not treated. Nothing makes pain better or worse. Denies vaginal bleeding or LOF. Positive fetal movement.  Also endorses epigastric pain that is constant & describes as burning. Rates pain 8/10. Has not treated pain. Some nausea. No vomiting.   OB History    Gravida Para Term Preterm AB Living   3 2 2  0 0 2   SAB TAB Ectopic Multiple Live Births   0 0 0 0 2      Past Medical History:  Diagnosis Date  . Anemia   . Anxiety   . Chlamydia 10-2011  . Cholecystitis   . Headache(784.0)   . Heart palpitations     Past Surgical History:  Procedure Laterality Date  . ORIF ANKLE FRACTURE Left 02/12/2016   Procedure: OPEN REDUCTION INTERNAL FIXATION (ORIF) ANKLE FRACTURE;  Surgeon: Myrene GalasMichael Handy, MD;  [facility];  Service: Orthopedics;  Laterality: Left;    Family History  Problem Relation Age of Onset  . Diabetes Maternal Grandmother   . Hypertension Mother   . Anesthesia problems Neg Hx   . Other Neg Hx     Social History  Substance Use Topics  . Smoking status: Current Every Day Smoker    Packs/day: 0.25    Years: 3.00    Types: Cigarettes  . Smokeless tobacco: Never Used  . Alcohol use Yes     Comment: socially    Allergies:  Allergies  Allergen Reactions  . Metronidazole Nausea And Vomiting    Prescriptions Prior to Admission  Medication Sig Dispense Refill Last Dose  . Prenatal Vit-Fe Fumarate-FA (PNV PRENATAL PLUS MULTIVITAMIN) 27-1 MG TABS TAKE 1 TABLET BY MOUTH DAILY AT NOON (Patient not taking: Reported on 07/25/2016) 30 tablet 0 Not Taking at Unknown time    Review  of Systems  Constitutional: Negative.   Gastrointestinal: Positive for abdominal pain, heartburn and nausea. Negative for constipation, diarrhea and vomiting.  Genitourinary: Negative.    Physical Exam   Temperature 98.1 F (36.7 C), temperature source Oral, resp. rate 16, height 5\' 6"  (1.676 m), weight 172 lb (78 kg), last menstrual period 11/26/2015, SpO2 100 %.  Physical Exam  Nursing note and vitals reviewed. Constitutional: She is oriented to person, place, and time. She appears well-developed and well-nourished. No distress.  HENT:  Head: Normocephalic and atraumatic.  Eyes: Conjunctivae are normal. Right eye exhibits no discharge. Left eye exhibits no discharge. No scleral icterus.  Neck: Normal range of motion.  Cardiovascular: Normal rate, regular rhythm and normal heart sounds.   No murmur heard. Respiratory: Effort normal and breath sounds normal. No respiratory distress. She has no wheezes.  GI: Soft. There is no tenderness.  Neurological: She is alert and oriented to person, place, and time.  Skin: Skin is warm and dry. She is not diaphoretic.  Psychiatric: She has a normal mood and affect. Her behavior is normal. Judgment and thought content normal.   Dilation: Closed Effacement (%): Thick Cervical Position: Middle Station: -3 Exam by:: Judeth HornErin Stevenson Windmiller  NP  Fetal Tracing:  Baseline: 125 Variability:  moderate Accelerations: 15x15 Decelerations: none  Toco:  UI MAU Course  Procedures Results for orders placed or performed during the hospital encounter of 07/25/16 (from the past 24 hour(s))  Urinalysis, Routine w reflex microscopic (not at [facility])     Status: Abnormal   Collection Time: 07/25/16  5:10 PM  Result Value Ref Range   Color, Urine YELLOW YELLOW   APPearance CLOUDY (A) CLEAR   Specific Gravity, Urine 1.020 1.005 - 1.030   pH 7.0 5.0 - 8.0   Glucose, UA NEGATIVE NEGATIVE mg/dL   Hgb urine dipstick NEGATIVE NEGATIVE   Bilirubin Urine NEGATIVE  NEGATIVE   Ketones, ur NEGATIVE NEGATIVE mg/dL   Protein, ur NEGATIVE NEGATIVE mg/dL   Nitrite NEGATIVE NEGATIVE   Leukocytes, UA SMALL (A) NEGATIVE  Urine microscopic-add on     Status: Abnormal   Collection Time: 07/25/16  5:10 PM  Result Value Ref Range   Squamous Epithelial / LPF 6-30 (A) NONE SEEN   WBC, UA 0-5 0 - 5 WBC/hpf   RBC / HPF NONE SEEN 0 - 5 RBC/hpf   Bacteria, UA NONE SEEN NONE SEEN   Urine-Other AMORPHOUS URATES/PHOSPHATES     MDM Category 1 tracing Cervix closed GI cocktail -- epigastric pain resolved UI resolved with PO fluids Assessment and Plan  A: 1. Braxton Hicks contractions   2. Heartburn during pregnancy in third trimester    P: Discharge home rx zantac Discussed reasons to return to MAU Keep f/u with OB  Judeth Horn 07/25/2016, 5:39 PM

## 2016-07-25 NOTE — MAU Note (Signed)
Having a lot of pelvic pain/pressure, stomach is burning.  Like she has eaten something hot and it is reacting, but she hasn't.  The pain is making her nauseated.

## 2016-07-25 NOTE — Discharge Instructions (Signed)
Heartburn During Pregnancy Heartburn is a burning sensation in the chest caused by stomach acid backing up into the esophagus. Heartburn is common in pregnancy because a certain hormone (progesterone) is released when a woman is pregnant. The progesterone hormone may relax the valve that separates the esophagus from the stomach. This allows acid to go up into the esophagus, causing heartburn. Heartburn may also happen in pregnancy because the enlarging uterus pushes up on the stomach, which pushes more acid into the esophagus. This is especially true in the later stages of pregnancy. Heartburn problems usually go away after giving birth. CAUSES  Heartburn is caused by stomach acid backing up into the esophagus. During pregnancy, this may result from various things, including:   The progesterone hormone.  Changing hormone levels.  The growing uterus pushing stomach acid upward.  Large meals.  Certain foods and drinks.  Exercise.  Increased acid production. SIGNS AND SYMPTOMS   Burning pain in the chest or lower throat.  Bitter taste in the mouth.  Coughing. DIAGNOSIS  Your health care provider will typically diagnose heartburn by taking a careful history of your concern. Blood tests may be done to check for a certain type of bacteria that is associated with heartburn. Sometimes, heartburn is diagnosed by prescribing a heartburn medicine to see if the symptoms improve. In some cases, a procedure called an endoscopy may be done. In this procedure, a tube with a light and a camera on the end (endoscope) is used to examine the esophagus and the stomach. TREATMENT  Treatment will vary depending on the severity of your symptoms. Your health care provider may recommend:  Over-the-counter medicines (antacids, acid reducers) for mild heartburn.  Prescription medicines to decrease stomach acid or to protect your stomach lining.  Certain changes in your diet.  Elevating the head of your bed  by putting blocks under the legs. This helps prevent stomach acid from backing up into the esophagus when you are lying down. HOME CARE INSTRUCTIONS   Only take over-the-counter or prescription medicines as directed by your health care provider.  Raise the head of your bed by putting blocks under the legs if instructed to do so by your health care provider. Sleeping with more pillows is not effective because it only changes the position of your head.  Do not exercise right after eating.  Avoid eating 2-3 hours before bed. Do not lie down right after eating.  Eat small meals throughout the day instead of three large meals.  Identify foods and beverages that make your symptoms worse and avoid them. Foods you may want to avoid include:  Peppers.  Chocolate.  High-fat foods, including fried foods.  Spicy foods.  Garlic and onions.  Citrus fruits, including oranges, grapefruit, lemons, and limes.  Food containing tomatoes or tomato products.  Mint.  Carbonated and caffeinated drinks.  Vinegar. SEEK MEDICAL CARE IF:  You have abdominal pain of any kind.  You feel burning in your upper abdomen or chest, especially after eating or lying down.  You have nausea and vomiting.  Your stomach feels upset after you eat. SEEK IMMEDIATE MEDICAL CARE IF:   You have severe chest pain that goes down your arm or into your jaw or neck.  You feel sweaty, dizzy, or light-headed.  You become short of breath.  You vomit blood.  You have difficulty or pain with swallowing.  You have bloody or black, tarry stools.  You have episodes of heartburn more than 3 times a week,  for more than 2 weeks. MAKE SURE YOU:  Understand these instructions.  Will watch your condition.  Will get help right away if you are not doing well or get worse.   This information is not intended to replace advice given to you by your health care provider. Make sure you discuss any questions you have with  your health care provider.   Document Released: 09/16/2000 Document Revised: 10/10/2014 Document Reviewed: 05/08/2013 Elsevier Interactive Patient Education 2016 Elsevier Inc.      [employer] of the uterus can occur throughout pregnancy. Contractions are not always a sign that you are in labor.  WHAT ARE BRAXTON HICKS CONTRACTIONS?  Contractions that occur before labor are called Braxton Hicks contractions, or false labor. Toward the end of pregnancy (32-34 weeks), these contractions can develop more often and may become more forceful. This is not true labor because these contractions do not result in opening (dilatation) and thinning of the cervix. They are sometimes difficult to tell apart from true labor because these contractions can be forceful and people have different pain tolerances. You should not feel embarrassed if you go to the hospital with false labor. Sometimes, the only way to tell if you are in true labor is for your health care provider to look for changes in the cervix. If there are no prenatal problems or other health problems associated with the pregnancy, it is completely safe to be sent home with false labor and await the onset of true labor. HOW CAN YOU TELL THE DIFFERENCE BETWEEN TRUE AND FALSE LABOR? False Labor  The contractions of false labor are usually shorter and not as hard as those of true labor.   The contractions are usually irregular.   The contractions are often felt in the front of the lower abdomen and in the groin.   The contractions may go away when you walk around or change positions while lying down.   The contractions get weaker and are shorter lasting as time goes on.   The contractions do not usually become progressively stronger, regular, and closer together as with true labor.  True Labor  Contractions in true labor last 30-70 seconds, become very regular, usually become more intense, and increase in  frequency.   The contractions do not go away with walking.   The discomfort is usually felt in the top of the uterus and spreads to the lower abdomen and low back.   True labor can be determined by your health care provider with an exam. This will show that the cervix is dilating and getting thinner.  WHAT TO REMEMBER  Keep up with your usual exercises and follow other instructions given by your health care provider.   Take medicines as directed by your health care provider.   Keep your regular prenatal appointments.   Eat and drink lightly if you think you are going into labor.   If Braxton Hicks contractions are making you uncomfortable:   Change your position from lying down or resting to walking, or from walking to resting.   Sit and rest in a tub of warm water.   Drink 2-3 glasses of water. Dehydration may cause these contractions.   Do slow and deep breathing several times an hour.  WHEN SHOULD I SEEK IMMEDIATE MEDICAL CARE? Seek immediate medical care if:  Your contractions become stronger, more regular, and closer together.   You have fluid leaking or gushing from your vagina.   You have a fever.  You pass blood-tinged mucus.   You have vaginal bleeding.   You have continuous abdominal pain.   You have low back pain that you never had before.   You feel your baby's head pushing down and causing pelvic pressure.   Your baby is not moving as much as it used to.    This information is not intended to replace advice given to you by your health care provider. Make sure you discuss any questions you have with your health care provider.   Document Released: 09/19/2005 Document Revised: 09/24/2013 Document Reviewed: 07/01/2013 Elsevier Interactive Patient Education [employer]2016 Elsevier Inc.

## 2016-07-26 ENCOUNTER — Ambulatory Visit (INDEPENDENT_AMBULATORY_CARE_PROVIDER_SITE_OTHER): Payer: Medicaid Other | Admitting: Certified Nurse Midwife

## 2016-07-26 VITALS — BP 126/78 | HR 96 | Wt 172.0 lb

## 2016-07-26 DIAGNOSIS — Z3483 Encounter for supervision of other normal pregnancy, third trimester: Secondary | ICD-10-CM
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
## 2016-08-02 ENCOUNTER — Ambulatory Visit (INDEPENDENT_AMBULATORY_CARE_PROVIDER_SITE_OTHER): Payer: Medicaid Other | Admitting: Obstetrics

## 2016-08-02 ENCOUNTER — Encounter: Payer: Self-pay | Admitting: Obstetrics

## 2016-08-02 VITALS — BP 138/79 | HR 98 | Wt 174.0 lb

## 2016-08-02 DIAGNOSIS — Z3483 Encounter for supervision of other normal pregnancy, third trimester: Secondary | ICD-10-CM

## 2016-08-02 DIAGNOSIS — Z3480 Encounter for supervision of other normal pregnancy, unspecified trimester: Secondary | ICD-10-CM
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
## 2016-08-02 NOTE — Progress Notes (Signed)
Pt states she is having increase in pelvic pain, 8/10 on scale. Pt states  Increase in hip pain.

## 2016-08-04 LAB — STREP GP B NAA: STREP GROUP B AG: POSITIVE — AB

## 2016-08-06 ENCOUNTER — Encounter (HOSPITAL_COMMUNITY): Payer: Self-pay | Admitting: Certified Nurse Midwife

## 2016-08-06 ENCOUNTER — Inpatient Hospital Stay (HOSPITAL_COMMUNITY)
Admission: AD | Admit: 2016-08-06 | Discharge: 2016-08-06 | Disposition: A | Discharge status: Home / Self Care | Payer: Medicaid Other | Source: Ambulatory Visit | Attending: Obstetrics and Gynecology | Admitting: Obstetrics and Gynecology

## 2016-08-06 DIAGNOSIS — N760 Acute vaginitis: Secondary | ICD-10-CM | POA: Diagnosis not present

## 2016-08-06 DIAGNOSIS — B9689 Other specified bacterial agents as the cause of diseases classified elsewhere: Secondary | ICD-10-CM | POA: Diagnosis not present

## 2016-08-06 DIAGNOSIS — O23593 Infection of other part of genital tract in pregnancy, third trimester: Secondary | ICD-10-CM | POA: Diagnosis not present

## 2016-08-06 DIAGNOSIS — O26893 Other specified pregnancy related conditions, third trimester: Secondary | ICD-10-CM | POA: Diagnosis not present

## 2016-08-06 DIAGNOSIS — N7689 Other specified inflammation of vagina and vulva: Secondary | ICD-10-CM | POA: Diagnosis present

## 2016-08-06 DIAGNOSIS — Z3A36 36 weeks gestation of pregnancy: Secondary | ICD-10-CM | POA: Insufficient documentation

## 2016-08-06 DIAGNOSIS — O23599 Infection of other part of genital tract in pregnancy, unspecified trimester: Secondary | ICD-10-CM

## 2016-08-06 LAB — WET PREP, GENITAL
SPERM: NONE SEEN
TRICH WET PREP: NONE SEEN

## 2016-08-06 LAB — OB RESULTS CONSOLE GC/CHLAMYDIA: GC PROBE AMP, GENITAL: NEGATIVE

## 2016-08-06 LAB — OB RESULTS CONSOLE GBS: GBS: POSITIVE

## 2016-08-06 MED ORDER — TERCONAZOLE 0.4 % VA CREA: 1.0000 | TOPICAL_CREAM | Freq: Every day | VAGINAL | 0 refills | Status: AC

## 2016-08-06 NOTE — MAU Note (Signed)
Pt states she had intercourse a few days ago then had bleeding and now her vaginal area is tender and swollen. Pt denies ctxs or LOF. Fetus is active.

## 2016-08-06 NOTE — Discharge Instructions (Signed)
Bacterial Vaginosis Bacterial vaginosis is an infection of the vagina. It happens when too many germs (bacteria) grow in the vagina. Having this infection puts you at risk for getting other infections from sex. Treating this infection can help lower your risk for other infections, such as:   Chlamydia.  Gonorrhea.  HIV.  Herpes. HOME CARE  Take your medicine as told by your doctor.  Finish your medicine even if you start to feel better.  Tell your sex partner that you have an infection. They should see their doctor for treatment.  During treatment:  Avoid sex or use condoms correctly.  Do not douche.  Do not drink alcohol unless your doctor tells you it is ok.  Do not breastfeed unless your doctor tells you it is ok. GET HELP IF:  You are not getting better after 3 days of treatment.  You have more grey fluid (discharge) coming from your vagina than before.  You have more pain than before.  You have a fever. MAKE SURE YOU:   Understand these instructions.  Will watch your condition.  Will get help right away if you are not doing well or get worse.   This information is not intended to replace advice given to you by your health care provider. Make sure you discuss any questions you have with your health care provider.   Document Released: 06/28/2008 Document Revised: 10/10/2014 Document Reviewed: 05/01/2013 Elsevier Interactive Patient Education 2016 Elsevier Inc. Monilial Vaginitis Vaginitis in a soreness, swelling and redness (inflammation) of the vagina and vulva. Monilial vaginitis is not a sexually transmitted infection. CAUSES  Yeast vaginitis is caused by yeast (candida) that is normally found in your vagina. With a yeast infection, the candida has overgrown in number to a point that upsets the chemical balance. SYMPTOMS   White, thick vaginal discharge.  Swelling, itching, redness and irritation of the vagina and possibly the lips of the vagina  (vulva).  Burning or painful urination.  Painful intercourse. DIAGNOSIS  Things that may contribute to monilial vaginitis are:  Postmenopausal and virginal states.  Pregnancy.  Infections.  Being tired, sick or stressed, especially if you had monilial vaginitis in the past.  Diabetes. Good control will help lower the chance.  Birth control pills.  Tight fitting garments.  Using bubble bath, feminine sprays, douches or deodorant tampons.  Taking certain medications that kill germs (antibiotics).  Sporadic recurrence can occur if you become ill. TREATMENT  Your caregiver will give you medication.  There are several kinds of anti monilial vaginal creams and suppositories specific for monilial vaginitis. For recurrent yeast infections, use a suppository or cream in the vagina 2 times a week, or as directed.  Anti-monilial or steroid cream for the itching or irritation of the vulva may also be used. Get your caregiver's permission.  Painting the vagina with methylene blue solution may help if the monilial cream does not work.  Eating yogurt may help prevent monilial vaginitis. HOME CARE INSTRUCTIONS   Finish all medication as prescribed.  Do not have sex until treatment is completed or after your caregiver tells you it is okay.  Take warm sitz baths.  Do not douche.  Do not use tampons, especially scented ones.  Wear cotton underwear.  Avoid tight pants and panty hose.  Tell your sexual partner that you have a yeast infection. They should go to their caregiver if they have symptoms such as mild rash or itching.  Your sexual partner should be treated as well if   your infection is difficult to eliminate.  Practice safer sex. Use condoms.  Some vaginal medications cause latex condoms to fail. Vaginal medications that harm condoms are:  Cleocin cream.  Butoconazole (Femstat).  Terconazole (Terazol) vaginal suppository.  Miconazole (Monistat) (may be  purchased over the counter). SEEK MEDICAL CARE IF:   You have a temperature by mouth above 102 F (38.9 C).  The infection is getting worse after 2 days of treatment.  The infection is not getting better after 3 days of treatment.  You develop blisters in or around your vagina.  You develop vaginal bleeding, and it is not your menstrual period.  You have pain when you urinate.  You develop intestinal problems.  You have pain with sexual intercourse.   This information is not intended to replace advice given to you by your health care provider. Make sure you discuss any questions you have with your health care provider.   Document Released: 06/29/2005 Document Revised: 12/12/2011 Document Reviewed: 03/23/2015 Elsevier Interactive Patient Education 2016 Elsevier Inc.  

## 2016-08-06 NOTE — MAU Provider Note (Signed)
Chief Complaint:  Vaginal Pain   First Provider Initiated Contact with Patient 08/06/16 1550      HPI: [name] [name] is a 25 y.o. G3P2002 at 5371w2d who presents to maternity admissions reporting vaginal pain.  Patient states for 1 week she has been having worsening vaginal pain, from the mucosa of the vagina. She denies any different discharge or bleeding. Denies odor. States hurts to have sex as well. Not getting better, has tried Vagisil without relief.   Denies contractions, leakage of fluid or vaginal bleeding. Good fetal movement.     Past Medical History: Past Medical History:  Diagnosis Date  . Anemia   . Anxiety   . Chlamydia 10-2011  . Cholecystitis   . Headache(784.0)   . Heart palpitations     Past obstetric history: OB History  Gravida Para Term Preterm AB Living  3 2 2  0 0 2  SAB TAB Ectopic Multiple Live Births  0 0 0 0 2    # Outcome Date GA Lbr Len/2nd Weight Sex Delivery Anes PTL Lv  3 Current           2 Term 12/14/13 7167w1d 10:03 / 00:08 6 lb 4.5 oz (2.85 kg) F Vag-Spont EPI  LIV  1 Term 07/03/12 3674w1d 33:31 / 00:09 5 lb 7.1 oz (2.469 kg) F Vag-Spont EPI  LIV      Past Surgical History: Past Surgical History:  Procedure Laterality Date  . ORIF ANKLE FRACTURE Left 02/12/2016   Procedure: OPEN REDUCTION INTERNAL FIXATION (ORIF) ANKLE FRACTURE;  Surgeon: Myrene GalasMichael Handy, MD;  Location: [facility];  Service: Orthopedics;  Laterality: Left;     Family History: Family History  Problem Relation Age of Onset  . Diabetes Maternal Grandmother   . Hypertension Mother   . Anesthesia problems Neg Hx   . Other Neg Hx     Social History: Social History  Substance Use Topics  . Smoking status: Current Every Day Smoker    Packs/day: 0.25    Years: 3.00    Types: Cigarettes  . Smokeless tobacco: Never Used  . Alcohol use No     Comment: socially    Allergies:  Allergies  Allergen Reactions  . Metronidazole Nausea And Vomiting    Meds:  Prescriptions Prior  to Admission  Medication Sig Dispense Refill Last Dose  . Prenat-FeAsp-Meth-FA-DHA w/o A (PRENATE PIXIE) 10-0.6-0.4-200 MG CAPS Take 1 capsule by mouth daily.   08/06/2016 at Unknown time    I have reviewed patient's Past Medical Hx, Surgical Hx, Family Hx, Social Hx, medications and allergies.   ROS:  A comprehensive ROS was negative except per HPI.    Physical Exam  Patient Vitals for the past 24 hrs:  BP Temp Temp src Pulse Resp  08/06/16 1439 139/80 98.4 F (36.9 C) Oral 88 18   Constitutional: Well-developed, well-nourished female in no acute distress.  Cardiovascular: normal rate Respiratory: normal effort GI: Abd soft, non-tender, gravid appropriate for gestational age. Pos BS x 4 MS: Extremities nontender, no edema, normal ROM Neurologic: Alert and oriented x 4.  GU: Neg CVAT. Pelvic: NEFG, erythematous vaginal mucosa with yeast-like white chunky discharge and large amount of yellow liquidy discharge, no overt blood but friable cervix. No CMT  Dilation: Fingertip Effacement (%): Thick Cervical Position: Posterior Station: -3 Presentation: Vertex Exam by:: Dr. Omer JackMumaw  FHT:  Baseline 130 , moderate variability, accelerations present, no decelerations Contractions: None   Labs: Results for orders placed or performed during the hospital encounter  of 08/06/16 (from the past 24 hour(s))  OB RESULT CONSOLE Group B Strep     Status: None   Collection Time: 08/06/16  3:05 PM  Result Value Ref Range   GBS Positive   Wet prep, genital     Status: Abnormal   Collection Time: 08/06/16  3:10 PM  Result Value Ref Range   Yeast Wet Prep HPF POC PRESENT (A) NONE SEEN   Trich, Wet Prep NONE SEEN NONE SEEN   Clue Cells Wet Prep HPF POC PRESENT (A) NONE SEEN   WBC, Wet Prep HPF POC MANY (A) NONE SEEN   Sperm NONE SEEN     Imaging:  No results found.  MAU Course: SSE  Dilation: Fingertip Effacement (%): Thick Cervical Position: Posterior Station: -3 Presentation:  Vertex Exam by:: Dr. Laurell RoofMumaw Wet Prep - +Yeast, +BV Crist FatFern - NEG Gc/Ct  MDM: Plan of care reviewed with patient, including labs and tests ordered and medical treatment.  I personally reviewed the patient's NST today, found to be REACTIVE. 130 bpm, mod var, +accels, no decels. CTX: None.   Assessment: 1. Vaginitis complicating current pregnancy   2. Bacterial vaginosis     Plan: Discharge home in stable condition.  Preterm Labor precautions and fetal kick counts      Medication List    ASK your doctor about these medications   terconazole 0.4 % vaginal cream Commonly known as:  TERAZOL 7 Place 1 applicator vaginally at bedtime. Ask about: Should I take this medication?       Earnstine RegalElizabeth W Seal BeachMumaw, DO OB Fellow 08/06/2016 3:51 PM

## 2016-08-08 LAB — GC/CHLAMYDIA PROBE AMP (~~LOC~~) NOT AT ARMC
CHLAMYDIA, DNA PROBE: NEGATIVE
NEISSERIA GONORRHEA: NEGATIVE

## 2016-08-09 ENCOUNTER — Ambulatory Visit (INDEPENDENT_AMBULATORY_CARE_PROVIDER_SITE_OTHER): Payer: Medicaid Other | Admitting: Obstetrics

## 2016-08-09 VITALS — BP 134/88 | HR 86 | Temp 97.9°F | Wt 179.0 lb

## 2016-08-09 DIAGNOSIS — N760 Acute vaginitis: Secondary | ICD-10-CM

## 2016-08-09 DIAGNOSIS — Z3483 Encounter for supervision of other normal pregnancy, third trimester: Secondary | ICD-10-CM

## 2016-08-09 DIAGNOSIS — B9689 Other specified bacterial agents as the cause of diseases classified elsewhere: Secondary | ICD-10-CM

## 2016-08-09 MED ORDER — CLINDAMYCIN HCL 300 MG PO CAPS: 300.0000 mg | ORAL_CAPSULE | Freq: Three times a day (TID) | ORAL | 0 refills | Status: DC

## 2016-08-11 ENCOUNTER — Encounter: Payer: Self-pay | Admitting: Obstetrics

## 2016-08-11 ENCOUNTER — Other Ambulatory Visit: Payer: Self-pay | Admitting: Obstetrics

## 2016-08-11 LAB — NUSWAB VG+, CANDIDA 6SP
CANDIDA ALBICANS, NAA: POSITIVE — AB
CANDIDA GLABRATA, NAA: NEGATIVE
CANDIDA TROPICALIS, NAA: NEGATIVE
Candida krusei, NAA: NEGATIVE
Candida lusitaniae, NAA: NEGATIVE
Candida parapsilosis, NAA: NEGATIVE
Chlamydia trachomatis, NAA: NEGATIVE
Neisseria gonorrhoeae, NAA: NEGATIVE
Trich vag by NAA: NEGATIVE
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
## 2016-08-13 ENCOUNTER — Encounter (HOSPITAL_COMMUNITY): Payer: Self-pay | Admitting: *Deleted

## 2016-08-13 ENCOUNTER — Inpatient Hospital Stay (HOSPITAL_COMMUNITY)
Admission: AD | Admit: 2016-08-13 | Discharge: 2016-08-13 | Disposition: A | Discharge status: Home / Self Care | Payer: Medicaid Other | Source: Ambulatory Visit | Attending: Obstetrics and Gynecology | Admitting: Obstetrics and Gynecology

## 2016-08-13 DIAGNOSIS — Z3A37 37 weeks gestation of pregnancy: Secondary | ICD-10-CM | POA: Diagnosis not present

## 2016-08-13 DIAGNOSIS — Z3493 Encounter for supervision of normal pregnancy, unspecified, third trimester: Secondary | ICD-10-CM | POA: Insufficient documentation

## 2016-08-13 DIAGNOSIS — B9689 Other specified bacterial agents as the cause of diseases classified elsewhere: Secondary | ICD-10-CM

## 2016-08-13 DIAGNOSIS — N760 Acute vaginitis: Secondary | ICD-10-CM

## 2016-08-13 NOTE — Discharge Instructions (Signed)
Keep scheduled appt for prenatal care. Drink 8-10 glasses of water per day. Call the office or provider on call with further concerns.

## 2016-08-13 NOTE — MAU Note (Signed)
States she has been feeling contractions and a lot of pressure all day.

## 2016-08-16 ENCOUNTER — Encounter: Payer: Self-pay | Admitting: Obstetrics

## 2016-08-16 ENCOUNTER — Ambulatory Visit (INDEPENDENT_AMBULATORY_CARE_PROVIDER_SITE_OTHER): Payer: Medicaid Other | Admitting: Obstetrics

## 2016-08-16 VITALS — BP 133/88 | HR 92 | Temp 98.0°F | Wt 181.5 lb

## 2016-08-16 DIAGNOSIS — Z3483 Encounter for supervision of other normal pregnancy, third trimester: Secondary | ICD-10-CM

## 2016-08-16 DIAGNOSIS — Z3480 Encounter for supervision of other normal pregnancy, unspecified trimester: Secondary | ICD-10-CM
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
## 2016-08-16 NOTE — Progress Notes (Signed)
Patient would like to be checked for dialation

## 2016-08-23 ENCOUNTER — Inpatient Hospital Stay (HOSPITAL_COMMUNITY): Payer: Medicaid Other | Admitting: Anesthesiology

## 2016-08-23 ENCOUNTER — Encounter (HOSPITAL_COMMUNITY): Payer: Self-pay | Admitting: *Deleted

## 2016-08-23 ENCOUNTER — Ambulatory Visit (INDEPENDENT_AMBULATORY_CARE_PROVIDER_SITE_OTHER): Payer: Medicaid Other | Admitting: Obstetrics

## 2016-08-23 ENCOUNTER — Encounter: Payer: Self-pay | Admitting: Obstetrics

## 2016-08-23 ENCOUNTER — Inpatient Hospital Stay (HOSPITAL_COMMUNITY)
Admission: AD | Admit: 2016-08-23 | Discharge: 2016-08-26 | DRG: 775 | Disposition: A | Discharge status: Home / Self Care | Payer: Medicaid Other | Source: Ambulatory Visit | Attending: Obstetrics & Gynecology | Admitting: Obstetrics & Gynecology

## 2016-08-23 VITALS — BP 143/97 | HR 84 | Temp 98.2°F | Wt 176.1 lb

## 2016-08-23 DIAGNOSIS — O99334 Smoking (tobacco) complicating childbirth: Secondary | ICD-10-CM | POA: Diagnosis present

## 2016-08-23 DIAGNOSIS — O134 Gestational [pregnancy-induced] hypertension without significant proteinuria, complicating childbirth: Principal | ICD-10-CM | POA: Diagnosis present

## 2016-08-23 DIAGNOSIS — Z8249 Family history of ischemic heart disease and other diseases of the circulatory system: Secondary | ICD-10-CM | POA: Diagnosis not present

## 2016-08-23 DIAGNOSIS — N760 Acute vaginitis: Secondary | ICD-10-CM

## 2016-08-23 DIAGNOSIS — Z3483 Encounter for supervision of other normal pregnancy, third trimester: Secondary | ICD-10-CM | POA: Diagnosis not present

## 2016-08-23 DIAGNOSIS — Z3480 Encounter for supervision of other normal pregnancy, unspecified trimester: Secondary | ICD-10-CM

## 2016-08-23 DIAGNOSIS — F17210 Nicotine dependence, cigarettes, uncomplicated: Secondary | ICD-10-CM | POA: Diagnosis present

## 2016-08-23 DIAGNOSIS — Z833 Family history of diabetes mellitus: Secondary | ICD-10-CM

## 2016-08-23 DIAGNOSIS — O139 Gestational [pregnancy-induced] hypertension without significant proteinuria, unspecified trimester: Secondary | ICD-10-CM | POA: Diagnosis present

## 2016-08-23 DIAGNOSIS — B9689 Other specified bacterial agents as the cause of diseases classified elsewhere: Secondary | ICD-10-CM

## 2016-08-23 DIAGNOSIS — O99824 Streptococcus B carrier state complicating childbirth: Secondary | ICD-10-CM | POA: Diagnosis present

## 2016-08-23 DIAGNOSIS — Z3A38 38 weeks gestation of pregnancy: Secondary | ICD-10-CM

## 2016-08-23 HISTORY — DX: Essential (primary) hypertension: I10

## 2016-08-23 LAB — CBC
HCT: 30.2 % — ABNORMAL LOW (ref 36.0–46.0)
HEMOGLOBIN: 9.8 g/dL — AB (ref 12.0–15.0)
MCH: 22.4 pg — ABNORMAL LOW (ref 26.0–34.0)
MCHC: 32.5 g/dL (ref 30.0–36.0)
MCV: 69.1 fL — ABNORMAL LOW (ref 78.0–100.0)
PLATELETS: 281 10*3/uL (ref 150–400)
RBC: 4.37 MIL/uL (ref 3.87–5.11)
RDW: 15.6 % — ABNORMAL HIGH (ref 11.5–15.5)
WBC: 9 10*3/uL (ref 4.0–10.5)

## 2016-08-23 LAB — COMPREHENSIVE METABOLIC PANEL
ALK PHOS: 169 U/L — AB (ref 38–126)
ALT: 9 U/L — AB (ref 14–54)
ANION GAP: 8 (ref 5–15)
AST: 13 U/L — ABNORMAL LOW (ref 15–41)
Albumin: 2.7 g/dL — ABNORMAL LOW (ref 3.5–5.0)
BUN: 5 mg/dL — ABNORMAL LOW (ref 6–20)
CALCIUM: 8 mg/dL — AB (ref 8.9–10.3)
CO2: 21 mmol/L — AB (ref 22–32)
CREATININE: 0.42 mg/dL — AB (ref 0.44–1.00)
Chloride: 105 mmol/L (ref 101–111)
Glucose, Bld: 75 mg/dL (ref 65–99)
Potassium: 3.2 mmol/L — ABNORMAL LOW (ref 3.5–5.1)
SODIUM: 134 mmol/L — AB (ref 135–145)
TOTAL PROTEIN: 6.6 g/dL (ref 6.5–8.1)
Total Bilirubin: 0.4 mg/dL (ref 0.3–1.2)

## 2016-08-23 LAB — CBC WITH DIFFERENTIAL/PLATELET
Basophils Absolute: 0 10*3/uL (ref 0.0–0.1)
Basophils Relative: 0 %
EOS ABS: 0.1 10*3/uL (ref 0.0–0.7)
EOS PCT: 1 %
HCT: 28.3 % — ABNORMAL LOW (ref 36.0–46.0)
Hemoglobin: 9.5 g/dL — ABNORMAL LOW (ref 12.0–15.0)
LYMPHS ABS: 2.1 10*3/uL (ref 0.7–4.0)
Lymphocytes Relative: 28 %
MCH: 23.1 pg — AB (ref 26.0–34.0)
MCHC: 33.6 g/dL (ref 30.0–36.0)
MCV: 68.9 fL — ABNORMAL LOW (ref 78.0–100.0)
MONO ABS: 0.4 10*3/uL (ref 0.1–1.0)
Monocytes Relative: 6 %
Neutro Abs: 5.1 10*3/uL (ref 1.7–7.7)
Neutrophils Relative %: 66 %
PLATELETS: 272 10*3/uL (ref 150–400)
RBC: 4.11 MIL/uL (ref 3.87–5.11)
RDW: 15.6 % — AB (ref 11.5–15.5)
WBC: 7.7 10*3/uL (ref 4.0–10.5)

## 2016-08-23 LAB — PROTEIN / CREATININE RATIO, URINE
CREATININE, URINE: 194 mg/dL
Protein Creatinine Ratio: 0.24 mg/mg{Cre} — ABNORMAL HIGH (ref 0.00–0.15)
Total Protein, Urine: 46 mg/dL

## 2016-08-23 LAB — URINE MICROSCOPIC-ADD ON: RBC / HPF: NONE SEEN RBC/hpf (ref 0–5)

## 2016-08-23 LAB — URINALYSIS, ROUTINE W REFLEX MICROSCOPIC
BILIRUBIN URINE: NEGATIVE
GLUCOSE, UA: NEGATIVE mg/dL
HGB URINE DIPSTICK: NEGATIVE
Ketones, ur: NEGATIVE mg/dL
Nitrite: NEGATIVE
PROTEIN: NEGATIVE mg/dL
Specific Gravity, Urine: 1.02 (ref 1.005–1.030)
pH: 6.5 (ref 5.0–8.0)

## 2016-08-23 LAB — TYPE AND SCREEN
ABO/RH(D): O POS
Antibody Screen: NEGATIVE

## 2016-08-23 MED ORDER — LACTATED RINGERS IV SOLN
INTRAVENOUS | Status: DC
  Administered 2016-08-23: 14:00:00 via INTRAVENOUS

## 2016-08-23 MED ORDER — OXYTOCIN 40 UNITS IN LACTATED RINGERS INFUSION - SIMPLE MED
INTRAVENOUS | Status: AC
  Administered 2016-08-23: 2 m[IU]/min via INTRAVENOUS
  Filled 2016-08-23: qty 1000

## 2016-08-23 MED ORDER — OXYTOCIN 40 UNITS IN LACTATED RINGERS INFUSION - SIMPLE MED: 1.0000 m[IU]/min | INTRAVENOUS | Status: DC

## 2016-08-23 MED ORDER — PHENYLEPHRINE 40 MCG/ML (10ML) SYRINGE FOR IV PUSH (FOR BLOOD PRESSURE SUPPORT)
80.0000 ug | PREFILLED_SYRINGE | INTRAVENOUS | Status: DC | PRN
  Filled 2016-08-23: qty 5

## 2016-08-23 MED ORDER — TERBUTALINE SULFATE 1 MG/ML IJ SOLN
0.2500 mg | Freq: Once | INTRAMUSCULAR | Status: DC | PRN
  Filled 2016-08-23: qty 1

## 2016-08-23 MED ORDER — ACETAMINOPHEN 325 MG PO TABS: 650.0000 mg | ORAL_TABLET | ORAL | Status: DC | PRN

## 2016-08-23 MED ORDER — EPHEDRINE 5 MG/ML INJ
10.0000 mg | INTRAVENOUS | Status: DC | PRN
  Filled 2016-08-23: qty 4

## 2016-08-23 MED ORDER — DIPHENHYDRAMINE HCL 50 MG/ML IJ SOLN: 12.5000 mg | INTRAMUSCULAR | Status: DC | PRN

## 2016-08-23 MED ORDER — PHENYLEPHRINE 40 MCG/ML (10ML) SYRINGE FOR IV PUSH (FOR BLOOD PRESSURE SUPPORT): 80.0000 ug | PREFILLED_SYRINGE | INTRAVENOUS | Status: DC | PRN

## 2016-08-23 MED ORDER — OXYTOCIN BOLUS FROM INFUSION: 500.0000 mL | Freq: Once | INTRAVENOUS | Status: DC

## 2016-08-23 MED ORDER — EPHEDRINE 5 MG/ML INJ: 10.0000 mg | INTRAVENOUS | Status: DC | PRN

## 2016-08-23 MED ORDER — FENTANYL CITRATE (PF) 100 MCG/2ML IJ SOLN
50.0000 ug | INTRAMUSCULAR | Status: DC | PRN
  Administered 2016-08-23: 50 ug via INTRAVENOUS
  Filled 2016-08-23: qty 2

## 2016-08-23 MED ORDER — OXYCODONE-ACETAMINOPHEN 5-325 MG PO TABS: 2.0000 | ORAL_TABLET | ORAL | Status: DC | PRN

## 2016-08-23 MED ORDER — LACTATED RINGERS IV SOLN: INTRAVENOUS | Status: DC

## 2016-08-23 MED ORDER — OXYCODONE-ACETAMINOPHEN 5-325 MG PO TABS: 1.0000 | ORAL_TABLET | ORAL | Status: DC | PRN

## 2016-08-23 MED ORDER — OXYTOCIN 40 UNITS IN LACTATED RINGERS INFUSION - SIMPLE MED
1.0000 m[IU]/min | INTRAVENOUS | Status: DC
  Administered 2016-08-23: 2 m[IU]/min via INTRAVENOUS

## 2016-08-23 MED ORDER — OXYTOCIN 40 UNITS IN LACTATED RINGERS INFUSION - SIMPLE MED: 2.5000 [IU]/h | INTRAVENOUS | Status: DC

## 2016-08-23 MED ORDER — ONDANSETRON HCL 4 MG/2ML IJ SOLN: 4.0000 mg | Freq: Four times a day (QID) | INTRAMUSCULAR | Status: DC | PRN

## 2016-08-23 MED ORDER — SOD CITRATE-CITRIC ACID 500-334 MG/5ML PO SOLN: 30.0000 mL | ORAL | Status: DC | PRN

## 2016-08-23 MED ORDER — LIDOCAINE HCL (PF) 1 % IJ SOLN
INTRAMUSCULAR | Status: DC | PRN
  Administered 2016-08-23: 4 mL via EPIDURAL

## 2016-08-23 MED ORDER — LACTATED RINGERS IV SOLN: 500.0000 mL | Freq: Once | INTRAVENOUS | Status: DC

## 2016-08-23 MED ORDER — LIDOCAINE HCL (PF) 1 % IJ SOLN
30.0000 mL | INTRAMUSCULAR | Status: DC | PRN
Start: 2016-08-23 — End: 2016-08-24
  Filled 2016-08-23: qty 30

## 2016-08-23 MED ORDER — LACTATED RINGERS IV SOLN: 500.0000 mL | INTRAVENOUS | Status: DC | PRN

## 2016-08-23 MED ORDER — PHENYLEPHRINE 40 MCG/ML (10ML) SYRINGE FOR IV PUSH (FOR BLOOD PRESSURE SUPPORT)
80.0000 ug | PREFILLED_SYRINGE | INTRAVENOUS | Status: DC | PRN
  Filled 2016-08-23: qty 10
  Filled 2016-08-23: qty 5

## 2016-08-23 MED ORDER — PENICILLIN G POT IN DEXTROSE 60000 UNIT/ML IV SOLN
3.0000 10*6.[IU] | INTRAVENOUS | Status: DC
  Administered 2016-08-23 (×2): 3 10*6.[IU] via INTRAVENOUS
  Filled 2016-08-23 (×5): qty 50

## 2016-08-23 MED ORDER — FENTANYL 2.5 MCG/ML BUPIVACAINE 1/10 % EPIDURAL INFUSION (WH - ANES)
14.0000 mL/h | INTRAMUSCULAR | Status: DC | PRN
  Administered 2016-08-23 – 2016-08-24 (×2): 14 mL/h via EPIDURAL
  Filled 2016-08-23 (×2): qty 100

## 2016-08-23 MED ORDER — DEXTROSE 5 % IV SOLN
5.0000 10*6.[IU] | Freq: Once | INTRAVENOUS | Status: AC
  Administered 2016-08-23: 5 10*6.[IU] via INTRAVENOUS
  Filled 2016-08-23: qty 5

## 2016-08-23 NOTE — MAU Note (Signed)
Sent from clinic for further evaluation of BP;

## 2016-08-23 NOTE — Anesthesia Preprocedure Evaluation (Signed)
Anesthesia Evaluation  Patient identified by MRN, date of birth, ID band Patient awake    Reviewed: Allergy & Precautions, Patient's Chart, lab work & pertinent test results  Airway Mallampati: I       Dental  (+) Teeth Intact   Pulmonary Current Smoker,    breath sounds clear to auscultation       Cardiovascular hypertension, negative cardio ROS   Rhythm:Regular Rate:Normal     Neuro/Psych  Headaches, PSYCHIATRIC DISORDERS Anxiety    GI/Hepatic negative GI ROS, Neg liver ROS,   Endo/Other  negative endocrine ROS  Renal/GU negative Renal ROS  negative genitourinary   Musculoskeletal negative musculoskeletal ROS (+)   Abdominal   Peds negative pediatric ROS (+)  Hematology negative hematology ROS (+)   Anesthesia Other Findings   Reproductive/Obstetrics (+) Pregnancy                             Lab Results  Component Value Date   WBC 9.0 08/23/2016   HGB 9.8 (L) 08/23/2016   HCT 30.2 (L) 08/23/2016   MCV 69.1 (L) 08/23/2016   PLT 281 08/23/2016   Lab Results  Component Value Date   INR 1.13 02/11/2016     Anesthesia Physical Anesthesia Plan  ASA: II  Anesthesia Plan: Epidural   Post-op Pain Management:    Induction:   Airway Management Planned:   Additional Equipment:   Intra-op Plan:   Post-operative Plan:   Informed Consent: I have reviewed the patients History and Physical, chart, labs and discussed the procedure including the risks, benefits and alternatives for the proposed anesthesia with the patient or authorized representative who has indicated his/her understanding and acceptance.     Plan Discussed with:   Anesthesia Plan Comments:         Anesthesia Quick Evaluation
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
## 2016-08-23 NOTE — Progress Notes (Signed)
Patient ID: [name] [name], female   DOB: 1991-07-01, 25 y.o.   MRN: [id number] [name] [name] is a 25 y.o. G3P2002 at 9167w5d [facility]  Subjective: Doing well, just got epidural. Mild headache all day, not bad enough to want meds for. Wants to have baby on 11/22, so wants to hold off on arom, etc until after midnight  Objective: BP (!) 146/70   Pulse 81   Temp 97.8 F (36.6 C) (Oral)   Resp 16   Ht 5\' 6"  (1.676 m)   Wt 79.8 kg (176 lb)   LMP 11/26/2015 (Exact Date)   SpO2 100%   BMI 28.41 kg/m  No intake/output data recorded.  FHT:  FHR: 120 bpm, variability: moderate,  accelerations:  Present,  decelerations:  Absent UC:   q 2-376mins  SVE:   Dilation: 3 Effacement (%): 70 Station: -2 Exam by:: Genella RifeK. Booker, CNM   Pitocin @ 10 mu/min  Labs: Lab Results  Component Value Date   WBC 9.0 08/23/2016   HGB 9.8 (L) 08/23/2016   HCT 30.2 (L) 08/23/2016   MCV 69.1 (L) 08/23/2016   PLT 281 08/23/2016    Assessment / Plan: IOL d/t GHTN, continue to increase pitocin per protocol to acheive adequate labor  Labor: Progressing normally Fetal Wellbeing:  Category I Pain Control:  Epidural Pre-eclampsia: GHTN, bp's non-severe, labs normal I/D:  pcn for gbs+ Anticipated MOD:  NSVD  [name], Kimberly Randall CNM, WHNP-BC 08/23/2016, 9:18 PM

## 2016-08-23 NOTE — Anesthesia Pain Management Evaluation Note (Signed)
  [facility] Pain Management Visit Note  Patient: Renella Cunasrika Lafoy, 25 y.o., female  "Hello I am a [facility] of the anesthesia [facility]. We have an anesthesia team available at all times to provide care throughout the hospital, including epidural management and anesthesia for C-section. I don't know your plan for the delivery whether it a natural birth, water birth, IV sedation, nitrous supplementation, doula or epidural, but we want to meet your pain goals."   1.Was your pain managed to your expectations on prior hospitalizations?   Yes   2.What is your expectation for pain management during this hospitalization?     Epidural  3.How can we help you reach that goal? epidural  Record the patient's initial score and the patient's pain goal.   Pain: 0  Pain Goal: 5 [facility] wants you to be able to say your pain was always managed very well.  Rica RecordsICKELTON,Maryjean Corpening 08/23/2016

## 2016-08-23 NOTE — Anesthesia Procedure Notes (Signed)
Epidural Patient location during procedure: OB Start time: 08/23/2016 8:43 PM End time: 08/23/2016 8:50 PM  Staffing Anesthesiologist: [name] SimpsonHOLLIS, Cayleigh Paull D Performed: anesthesiologist   Preanesthetic Checklist Completed: patient identified, site marked, surgical consent, pre-op evaluation, timeout performed, IV checked, risks and benefits discussed and monitors and equipment checked  Epidural Patient position: sitting Prep: ChloraPrep Patient monitoring: heart rate, continuous pulse ox and blood pressure Approach: midline Location: L3-L4 Injection technique: LOR saline  Needle:  Needle type: Tuohy  Needle gauge: 17 G Needle length: 9 cm Catheter type: closed end flexible Catheter size: 20 Guage Test dose: negative and 1.5% lidocaine  Assessment Events: blood not aspirated, injection not painful, no injection resistance and no paresthesia  Additional Notes LOR @ 5  Patient identified. Risks/Benefits/Options discussed with patient including but not limited to bleeding, infection, nerve damage, paralysis, failed block, incomplete pain control, headache, blood pressure changes, nausea, vomiting, reactions to medications, itching and postpartum back pain. Confirmed with bedside nurse the patient's most recent platelet count. Confirmed with patient that they are not currently taking any anticoagulation, have any bleeding history or any family history of bleeding disorders. Patient expressed understanding and wished to proceed. All questions were answered. Sterile technique was used throughout the entire procedure. Please see nursing notes for vital signs. Test dose was given through epidural catheter and negative prior to continuing to dose epidural or start infusion. Warning signs of high block given to the patient including shortness of breath, tingling/numbness in hands, complete motor block, or any concerning symptoms with instructions to call for help. Patient was given instructions on  fall risk and not to get out of bed. All questions and concerns addressed with instructions to call with any issues or inadequate analgesia.    Reason for block:procedure for pain
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
## 2016-08-24 ENCOUNTER — Encounter (HOSPITAL_COMMUNITY): Payer: Self-pay

## 2016-08-24 DIAGNOSIS — O134 Gestational [pregnancy-induced] hypertension without significant proteinuria, complicating childbirth: Secondary | ICD-10-CM

## 2016-08-24 DIAGNOSIS — Z3A38 38 weeks gestation of pregnancy: Secondary | ICD-10-CM

## 2016-08-24 DIAGNOSIS — O99824 Streptococcus B carrier state complicating childbirth: Secondary | ICD-10-CM

## 2016-08-24 LAB — RPR: RPR: NONREACTIVE

## 2016-08-24 MED ORDER — WITCH HAZEL-GLYCERIN EX PADS: 1.0000 "application " | MEDICATED_PAD | CUTANEOUS | Status: DC | PRN

## 2016-08-24 MED ORDER — SODIUM CHLORIDE 0.9 % IV SOLN: 250.0000 mL | INTRAVENOUS | Status: DC | PRN

## 2016-08-24 MED ORDER — FLEET ENEMA 7-19 GM/118ML RE ENEM: 1.0000 | ENEMA | Freq: Every day | RECTAL | Status: DC | PRN

## 2016-08-24 MED ORDER — ZOLPIDEM TARTRATE 5 MG PO TABS: 5.0000 mg | ORAL_TABLET | Freq: Every evening | ORAL | Status: DC | PRN

## 2016-08-24 MED ORDER — PRENATAL MULTIVITAMIN CH
1.0000 | ORAL_TABLET | Freq: Every day | ORAL | Status: DC
  Administered 2016-08-24 – 2016-08-26 (×3): 1 via ORAL
  Filled 2016-08-24 (×3): qty 1

## 2016-08-24 MED ORDER — SIMETHICONE 80 MG PO CHEW: 80.0000 mg | CHEWABLE_TABLET | ORAL | Status: DC | PRN

## 2016-08-24 MED ORDER — COCONUT OIL OIL
1.0000 "application " | TOPICAL_OIL | Status: DC | PRN
  Filled 2016-08-24: qty 120

## 2016-08-24 MED ORDER — SALINE SPRAY 0.65 % NA SOLN
1.0000 | NASAL | Status: DC | PRN
  Administered 2016-08-25: 1 via NASAL
  Filled 2016-08-24: qty 44

## 2016-08-24 MED ORDER — BENZOCAINE-MENTHOL 20-0.5 % EX AERO: 1.0000 "application " | INHALATION_SPRAY | CUTANEOUS | Status: DC | PRN

## 2016-08-24 MED ORDER — TETANUS-DIPHTH-ACELL PERTUSSIS 5-2.5-18.5 LF-MCG/0.5 IM SUSP: 0.5000 mL | Freq: Once | INTRAMUSCULAR | Status: DC

## 2016-08-24 MED ORDER — BISACODYL 10 MG RE SUPP: 10.0000 mg | Freq: Every day | RECTAL | Status: DC | PRN

## 2016-08-24 MED ORDER — OXYCODONE HCL 5 MG PO TABS
10.0000 mg | ORAL_TABLET | Freq: Four times a day (QID) | ORAL | Status: DC | PRN
  Administered 2016-08-24: 10 mg via ORAL
  Filled 2016-08-24 (×2): qty 2

## 2016-08-24 MED ORDER — SENNOSIDES-DOCUSATE SODIUM 8.6-50 MG PO TABS
2.0000 | ORAL_TABLET | ORAL | Status: DC
  Administered 2016-08-24 – 2016-08-25 (×2): 2 via ORAL
  Filled 2016-08-24 (×2): qty 2

## 2016-08-24 MED ORDER — MEASLES, MUMPS & RUBELLA VAC ~~LOC~~ INJ
0.5000 mL | INJECTION | Freq: Once | SUBCUTANEOUS | Status: DC
  Filled 2016-08-24: qty 0.5

## 2016-08-24 MED ORDER — ONDANSETRON HCL 4 MG PO TABS
4.0000 mg | ORAL_TABLET | ORAL | Status: DC | PRN
  Administered 2016-08-25: 4 mg via ORAL
  Filled 2016-08-24: qty 1

## 2016-08-24 MED ORDER — AMLODIPINE BESYLATE 10 MG PO TABS
10.0000 mg | ORAL_TABLET | Freq: Every day | ORAL | Status: DC
  Administered 2016-08-24 – 2016-08-26 (×3): 10 mg via ORAL
  Filled 2016-08-24 (×4): qty 1

## 2016-08-24 MED ORDER — DIBUCAINE 1 % RE OINT: 1.0000 "application " | TOPICAL_OINTMENT | RECTAL | Status: DC | PRN

## 2016-08-24 MED ORDER — SODIUM CHLORIDE 0.9% FLUSH: 3.0000 mL | INTRAVENOUS | Status: DC | PRN

## 2016-08-24 MED ORDER — ACETAMINOPHEN 325 MG PO TABS: 650.0000 mg | ORAL_TABLET | ORAL | Status: DC | PRN

## 2016-08-24 MED ORDER — SODIUM CHLORIDE 0.9% FLUSH: 3.0000 mL | Freq: Two times a day (BID) | INTRAVENOUS | Status: DC

## 2016-08-24 MED ORDER — IBUPROFEN 600 MG PO TABS
600.0000 mg | ORAL_TABLET | Freq: Four times a day (QID) | ORAL | Status: DC
  Administered 2016-08-24 – 2016-08-26 (×10): 600 mg via ORAL
  Filled 2016-08-24 (×10): qty 1

## 2016-08-24 MED ORDER — DIPHENHYDRAMINE HCL 25 MG PO CAPS: 25.0000 mg | ORAL_CAPSULE | Freq: Four times a day (QID) | ORAL | Status: DC | PRN

## 2016-08-24 MED ORDER — ONDANSETRON HCL 4 MG/2ML IJ SOLN: 4.0000 mg | INTRAMUSCULAR | Status: DC | PRN

## 2016-08-24 NOTE — Anesthesia Postprocedure Evaluation (Signed)
Anesthesia Post Note  Patient: [name] [name]  Procedure(s) Performed: * No procedures listed *  Patient location during evaluation: Mother Baby Anesthesia Type: Epidural Level of consciousness: awake and alert and oriented Pain management: satisfactory to patient Vital Signs Assessment: post-procedure vital signs reviewed and stable Respiratory status: spontaneous breathing and nonlabored ventilation Cardiovascular status: stable Postop Assessment: no headache, no backache, no signs of nausea or vomiting, adequate PO intake and patient able to bend at knees (patient up walking) Anesthetic complications: no     Last Vitals:  Vitals:   08/24/16 0900 08/24/16 1032  BP: 138/77 138/86  Pulse:  81  Resp:  18  Temp:  36.6 C    Last Pain:  Vitals:   08/24/16 1243  TempSrc:   PainSc: Asleep   Pain Goal:                 Madison HickmanGREGORY,Ghassan Coggeshall

## 2016-08-24 NOTE — Lactation Note (Addendum)
This note was copied from a baby's chart. Lactation Consultation Note  [name] [id number]'s Date: 08/24/2016 Reason for consult: Initial assessment  With this mom of a term baby, SGA at 5 1/2 pounds, with hyperbilirubinemia and phototherapy , now 10 hours old. Bili at 7 hours of age was 7. Mom is breast and formula feeding. The baby ws cuing, but mom said she had just fed so could not be hungry. I reviewed cued based feeding with mom, and advised parents to feed baby at least every 3 hours, due to her size, and more often with cues. I gave them the LPI green sheet on feeding , and explained that despite there baby being full term, a lot of the information applies, eg the supplementation amounts.  I observed the baby at the breast, and she was sleepy, but sucking with stimulation. Mom has drops of colostrum with HE. Mom agreed to supplement with formula, so I spoke to [employer]mom's nurse, Sallyanne KusterEllen B, and we chose Alimentum, sue to the bliirubin binding of the formula. Dad fed the 10 ml's and baby tolerated well. Side lying shown to parents and why .  DEP set up for mom, with 21 flanges, and mom to use coconut oil with pumping. Mom states her right nipple is already tender. I showed mom how to obtain and deep latch, with an assymetrical latch. Joaquim Lai. Latch was comfortable. Mom was having severe cramping at this time, after breastfeeding, so I set up DEP, and mom to pump every 3, after feeding, supplement with her EBm prior to formula, 10 ml's per feeding for today, and increase as per feeding supplement sheet.  [facility] and get [facility].    Maternal Data Formula Feeding for Exclusion: Yes Reason for exclusion: Mother's choice to formula and breast feed on admission Has patient been taught Hand Expression?: Yes Does the patient have breastfeeding experience prior to this delivery?: Yes  Feeding Feeding Type: Bottle Fed - Formula Nipple Type: Slow - flow Length of feed: 15  min  LATCH Score/Interventions Latch: Grasps breast easily, tongue down, lips flanged, rhythmical sucking.  Audible Swallowing: A few with stimulation Intervention(s): Skin to skin;Hand expression  Type of Nipple: Everted at rest and after stimulation  Comfort (Breast/Nipple): Filling, red/small blisters or bruises, mild/mod discomfort  Problem noted: Mild/Moderate discomfort (tender right nipple, appears intact) Interventions (Mild/moderate discomfort): Hand expression;Post-pump  Hold (Positioning): Assistance needed to correctly position infant at breast and maintain latch. Intervention(s): Breastfeeding basics reviewed;Support Pillows;Position options;Skin to skin  LATCH Score: 7  Lactation Tools Discussed/Used Tools: Pump Breast pump type: Double-Electric Breast Pump WIC Program: No (fax sent for mom to apply and get DEP) Pump Review: Setup, frequency, and cleaning;Milk Storage Initiated by:: Danton Clapchristine Jacquese Hackman, RN, IBCLC Date initiated:: 08/24/16   Consult Status Consult Status: Follow-up Date: 08/25/16 Follow-up type: In-patient    Alfred LevinsLee, Maryalice Pasley Anne 08/24/2016, 12:49 PM

## 2016-08-24 NOTE — Progress Notes (Signed)
UR chart review completed.  

## 2016-08-24 NOTE — Progress Notes (Addendum)
[facility]. Called physician regarding elevated BP. Told to recheck and if still > 140/90 to call for further orders.  Called physician again regarding BP of 150/89. See orders.

## 2016-08-24 NOTE — Progress Notes (Signed)
Patient ID: [name] [name], female   DOB: [birth year], 25 y.o.   MRN: [id number] [name] [name] is a 25 y.o. G3P2002 at 4280w6d admitted [facility]  Subjective: Doing well, sitting up in bed putting on makeup  Objective: BP (!) 144/88   Pulse 75   Temp 97.8 F (36.6 C) (Axillary)   Resp 18   Ht 5\' 6"  (1.676 m)   Wt 79.8 kg (176 lb)   LMP 11/26/2015 (Exact Date)   SpO2 100%   BMI 28.41 kg/m  No intake/output data recorded.  FHT:  FHR: 115 bpm, variability: moderate,  accelerations:  Present,  decelerations:  Absent UC:   regular, every 2-4 minutes  SVE:   Dilation: 4 Effacement (%): 80 Station: -2 Exam by:: Genella RifeK. Jaysha Lasure, CNM  AROM BBOW, mod amt clear fluid  Pitocin @ 10 mu/min  Labs: Lab Results  Component Value Date   WBC 9.0 08/23/2016   HGB 9.8 (L) 08/23/2016   HCT 30.2 (L) 08/23/2016   MCV 69.1 (L) 08/23/2016   PLT 281 08/23/2016    Assessment / Plan: Induction of labor due to GHTN,  progressing well on pitocin, now arom'd- had wanted to wait until after midnight- states always needs her water broken to help w/ labor  Labor: Progressing normally Fetal Wellbeing:  Category I Pain Control:  Epidural Pre-eclampsia: GHTN, bp's stable I/D:  pcn for gbs+ Anticipated MOD:  NSVD  [name], [name] [name] CNM, WHNP-BC 08/24/2016, 12:54 AM

## 2016-08-24 NOTE — Clinical Social Work Maternal (Signed)
  CLINICAL SOCIAL WORK MATERNAL/CHILD NOTE  Patient Details  Name: [name] [name] MRN: [id number] Date of Birth: 04/27/1991  Date:  08/24/2016  Clinical Social Worker Initiating Note:  Laurey Arrow Date/ Time Initiated:  08/24/16/1448     Child's Name:  [name] [name]   Legal Guardian:  Mother   Need for Interpreter:  None   Date of Referral:  08/17/16     Reason for Referral:  Behavioral Health Issues, including SI    Referral Source:  Central Nursery   Address:  [place] [address]  Phone number:  [phone]   Household Members:  Self, Significant Other, Other (Comment), Minor Children (FOB's mother)   Natural Supports (not living in the home):  Parent, Immediate Family, Extended Family   Professional Supports: None   Employment: Unemployed   Type of Work:     Education:  Database administrator Resources:  Medicaid   Other Resources:  [employer], Food Stamps    Cultural/Religious Considerations Which May Impact Care:  None Reported.   Strengths:  Ability to meet basic needs , Pediatrician chosen , Home prepared for child    Risk Factors/Current Problems:  Mental Health Concerns    Cognitive State:  Alert , Able to Concentrate , Linear Thinking , Insightful    Mood/Affect:  Bright , Happy , Interested , Comfortable    CSW Assessment: CSW met with [name] to complete an assessment for MH hx.  [name] was polite and receptive to meeting with CSW. CSW inquired about [name]'s MH hx and [name] acknowledged a hx of anxiety/depression.  [name] denied medication regiment and stated that [name] has been utilizing natural coping mechanisms (taking walks, self-care, and listening to music).  [name] communicated that [name] has a wealth of supports including FOB's immediate and extended family. CSW educated [name] about PPD. CSW informed [name] of possible supports and interventions to decrease PPD.  CSW also encouraged [name] to seek medical attention if needed for  increased signs and symptoms for PPD. [name] acknowledged experiencing PPD symptoms with [name].  [name] communicated that [name] was tearful most days, sad, and stress for about 3 months. [name] reported the [name]'s symptoms subsided with [name] utilizing natural coping skills (no medication or counseling). CSW offered [name] resources for outpatient counseling, and [name] declined. CSW reviewed safe sleep, and SIDS. [name] asked appropriate questions and appeared to be knowledgeable.  [name] communicated that she has a safe sleeper for the baby, and feels prepared for the infant. CSW provided [name] with information to add infant onto [name]'s current Food Stamp and [facility] applications.  [name] did not have any further questions, concerns, or needs at this time. CSW provided [name] with CSW contact information.  CSW Plan/Description:  Information/Referral to [employer] , No Further Intervention Required/No Barriers to Discharge, Patient/Family Education     Dimple Nanas, LCSW 08/24/2016, 2:50 PM

## 2016-08-25 NOTE — Lactation Note (Signed)
This note was copied from a baby's chart. Lactation Consultation Note  [name] Hott RUEAV'WToday's Date: 08/25/2016 Reason for consult: Follow-up assessment;Infant < 6lbs;Hyperbilirubinemia  Visited with Mom, baby 3034 hrs old.  Baby on double phototherapy (bili 9.8 at 0230).  Baby starting to cue, and Mom giving baby a pacifier.  Recommended she put baby to breast.  Assisted with pillow support, and importance of good support to avoid Mom hunching over.  Mom hand expressed colostrum prior to latching baby.  Baby able to latch deeply, and was sleepy at first, but after a few minutes became more rhythmic.  Swallowing identified.  Talked about importance of waking baby every 3 hrs and putting baby at the breast, and offering both breasts at every feeding.  Mom supplementing baby 15-20 ml EBM+/formula by slow flow bottle after breastfeeding.  Discussed importance of double pumping for 15 minutes after BFing.  To ask for assistance with any colostrum she expresses.  To bottle feed it or syringe/spoon feed her colostrum.  To call for assistance with this as needed. To follow up in am.    Consult Status Consult Status: Follow-up Date: 08/26/16 Follow-up type: In-patient    Judee ClaraSmith, Terran Hollenkamp E 08/25/2016, 1:42 PM

## 2016-08-26 MED ORDER — AMLODIPINE BESYLATE 10 MG PO TABS: 10.0000 mg | ORAL_TABLET | Freq: Every day | ORAL | 2 refills | Status: DC

## 2016-08-26 MED ORDER — IBUPROFEN 600 MG PO TABS: 600.0000 mg | ORAL_TABLET | Freq: Four times a day (QID) | ORAL | 0 refills | Status: DC

## 2016-08-26 MED ORDER — ACETAMINOPHEN 325 MG PO TABS: 650.0000 mg | ORAL_TABLET | ORAL | 0 refills | Status: DC | PRN

## 2016-08-26 NOTE — Lactation Note (Signed)
This note was copied from a baby's chart. Lactation Consultation Note  [name] NWGNF'AToday's Date: 08/26/2016 Reason for consult: Follow-up assessment;Hyperbilirubinemia;Infant < 6lbs  Visited with Mom on day of discharge, baby 54 hrs.  Baby has been BFing followed by supplementing with formula. Weight loss at 4% .  Mom asleep with baby laying on her chest in bed.  Took baby to place in crib, and baby woke and started cueing.  Mom positioned baby in football hold, and baby latched after a couple tries.  Baby vigorous. Swallowing identified.  Encouraged alternate breast compression to increase milk transfer.  Mom continues to supplement baby with 10-25 ml formula by bottle.  Encouraged Mom to post BF pump for 15-20 minutes and offer her EBM back to baby.  Phototherapy DC'd, bili 9.8.  To repeat bili at 4 pm.  Encouraged feeding baby every 2 hrs today, and pumping and supplementing with her EBM.   Mom doesn't have DEBP at home, but has WIC.  [facility]  Demonstrated how the manual pump works.  Mom to let us know is she would like a loaner pump before discharge.  Consult Status Consult Status: Complete Date: 08/26/16 Follow-up type: Call as needed    [name] [name], [name] [name] 08/26/2016, 9:27 AM

## 2016-08-26 NOTE — Discharge Summary (Signed)
OB Discharge Summary     [name] DOB: 1990-12-06 MRN: [id number]  Date of admission: 08/23/2016 Delivering MD: Shawna ClampBOOKER, KIMBERLY R   Date of discharge: 08/26/2016  Admitting diagnosis: 38WKS, HIGH BP Intrauterine pregnancy: 4743w6d     Secondary diagnosis:  Active Problems:   Gestational hypertension  Additional problems:      Discharge diagnosis: Term Pregnancy Delivered and Gestational Hypertension                                                                                                Post partum procedures:none  Augmentation: AROM and Pitocin  Complications: None  Hospital course:  Induction of Labor With Vaginal Delivery   25 y.o. yo G3P3003 at 2343w6d was admitted to the hospital 08/23/2016 for induction of labor.  Indication for induction: Gestational hypertension.  Patient had an uncomplicated labor course as follows: Membrane Rupture Time/Date: 12:48 AM ,08/24/2016   Intrapartum Procedures: Episiotomy: None [1]                                         Lacerations:  Periurethral [8]  Patient had delivery of a Viable infant.  Information for the patient's newborn:  Galen Maniladams, Girl Safire [[id number]][[id number]]  Delivery Method: Vaginal, Spontaneous Delivery (Filed from Delivery Summary)   08/24/2016  Details of delivery can be found in separate delivery note.  Patient had a routine postpartum course. Patient is discharged home 08/26/16.   Physical exam Vitals:   08/25/16 0826 08/25/16 0900 08/25/16 1900 08/26/16 0540  BP: 138/90 (!) 138/92 (!) 145/80 (!) 145/95  Pulse:   94 93  Resp:   20 18  Temp:   98.4 F (36.9 C) 98.4 F (36.9 C)  TempSrc:   Oral Oral  SpO2:   98%   Weight:      Height:       General: alert, cooperative and no distress Lochia: appropriate Uterine Fundus: firm Incision: N/A DVT Evaluation: No evidence of DVT seen on physical exam. Negative Homan's sign. No cords or calf tenderness. Labs: Lab Results  Component Value Date   WBC 9.0 08/23/2016   HGB 9.8 (L) 08/23/2016   HCT 30.2 (L) 08/23/2016   MCV 69.1 (L) 08/23/2016   PLT 281 08/23/2016   CMP Latest Ref Rng & Units 08/23/2016  Glucose 65 - 99 mg/dL 75  BUN 6 - 20 mg/dL 5(L)  Creatinine 7.820.44 - 1.00 mg/dL 9.56(O0.42(L)  Sodium 130135 - 865145 mmol/L 134(L)  Potassium 3.5 - 5.1 mmol/L 3.2(L)  Chloride 101 - 111 mmol/L 105  CO2 22 - 32 mmol/L 21(L)  Calcium 8.9 - 10.3 mg/dL 8.0(L)  Total Protein 6.5 - 8.1 g/dL 6.6  Total Bilirubin 0.3 - 1.2 mg/dL 0.4  Alkaline Phos 38 - 126 U/L 169(H)  AST 15 - 41 U/L 13(L)  ALT 14 - 54 U/L 9(L)    Discharge instruction: per After Visit Summary and "Baby and Me Booklet".  After visit meds:    Medication List  STOP taking these medications   clindamycin 300 MG capsule Commonly known as:  CLEOCIN     TAKE these medications   acetaminophen 325 MG tablet Commonly known as:  TYLENOL Take 2 tablets (650 mg total) by mouth every 4 (four) hours as needed (for pain scale < 4).   amLODipine 10 MG tablet Commonly known as:  NORVASC Take 1 tablet (10 mg total) by mouth daily. Start taking on:  08/27/2016   ibuprofen 600 MG tablet Commonly known as:  ADVIL,MOTRIN Take 1 tablet (600 mg total) by mouth every 6 (six) hours.   prenatal multivitamin Tabs tablet Take 1 tablet by mouth daily at 12 noon.       Diet: routine diet  Activity: Advance as tolerated. Pelvic rest for 6 weeks.   Outpatient follow up:2 weeks Follow up Appt:No future appointments. Follow up Visit:No Follow-up on file.  Postpartum contraception: IUD Mirena  Newborn Data: Live born female  Birth Weight: 5 lb 8.5 oz (2509 g) APGAR: 9, 10  Baby Feeding: Bottle and Breast Disposition:home with mother   08/26/2016 Deforest HoylesWALLACE, NOAH I, DO   OB FELLOW DISCHARGE ATTESTATION  I have seen and examined this patient and agree with above documentation in the resident's note.   Ernestina PennaNicholas Kinlee Garrison, MD 1:56 PM

## 2016-08-26 NOTE — Discharge Instructions (Signed)

## 2016-08-31 ENCOUNTER — Encounter: Payer: Medicaid Other | Admitting: Obstetrics

## 2016-10-03 NOTE — L&D Delivery Note (Signed)
Patient is a 26 y.o. now [id number] s/p NSVD at 147w4d, who was admitted for IOL due to fetal pericardial effusion and SGA.  Delivery Note At 7:14 PM a viable female was delivered via Vaginal, Spontaneous (Presentation: ROA; left hand compound presentation).  APGAR: , ; weight pending Placenta status: intact.  Cord: 3-vessels with the following complications: none  Anesthesia:  Epidural Episiotomy: None Lacerations: None Suture Repair: n/a Est. Blood Loss (mL): 200  Mom to postpartum.  Baby to Couplet care / Skin to Skin.  Arlyce Harmanimothy Lockamy 08/12/2017, 7:26 PM   Head delivered ROA with left hand compound presentation. No nuchal cord present. Shoulder and body delivered in usual fashion. Infant with spontaneous cry, placed on mother's abdomen, dried and bulb suctioned. Cord clamped x 2 after 1-minute delay, and cut by family member. Cord blood drawn. Placenta delivered spontaneously with gentle cord traction. Fundus firm with massage and Pitocin. Perineum inspected and found to have no laceration, which was found to be hemostatic with good hemostasis achieved.  Jules Schickim Lockamy, DO PGY-1, [facility] 08/12/17, 7:26 PM   I was present and gloved for the entire delivery. I agree with the note above.   Thressa ShellerHeather Addaline Peplinski 7:34 PM 08/12/17

## 2017-02-24 ENCOUNTER — Inpatient Hospital Stay (HOSPITAL_COMMUNITY)
Admission: AD | Admit: 2017-02-24 | Discharge: 2017-02-24 | Disposition: A | Discharge status: Home / Self Care | Payer: Medicaid Other | Source: Ambulatory Visit | Attending: Obstetrics & Gynecology | Admitting: Obstetrics & Gynecology

## 2017-02-24 ENCOUNTER — Encounter (HOSPITAL_COMMUNITY): Payer: Self-pay | Admitting: *Deleted

## 2017-02-24 DIAGNOSIS — Z3A12 12 weeks gestation of pregnancy: Secondary | ICD-10-CM | POA: Insufficient documentation

## 2017-02-24 DIAGNOSIS — N760 Acute vaginitis: Secondary | ICD-10-CM

## 2017-02-24 DIAGNOSIS — O99611 Diseases of the digestive system complicating pregnancy, first trimester: Secondary | ICD-10-CM | POA: Insufficient documentation

## 2017-02-24 DIAGNOSIS — R102 Pelvic and perineal pain: Secondary | ICD-10-CM | POA: Diagnosis not present

## 2017-02-24 DIAGNOSIS — O99331 Smoking (tobacco) complicating pregnancy, first trimester: Secondary | ICD-10-CM | POA: Diagnosis not present

## 2017-02-24 DIAGNOSIS — O99341 Other mental disorders complicating pregnancy, first trimester: Secondary | ICD-10-CM | POA: Diagnosis not present

## 2017-02-24 DIAGNOSIS — F17210 Nicotine dependence, cigarettes, uncomplicated: Secondary | ICD-10-CM | POA: Insufficient documentation

## 2017-02-24 DIAGNOSIS — Z3491 Encounter for supervision of normal pregnancy, unspecified, first trimester: Secondary | ICD-10-CM

## 2017-02-24 DIAGNOSIS — O26891 Other specified pregnancy related conditions, first trimester: Secondary | ICD-10-CM | POA: Insufficient documentation

## 2017-02-24 DIAGNOSIS — B9689 Other specified bacterial agents as the cause of diseases classified elsewhere: Secondary | ICD-10-CM | POA: Diagnosis not present

## 2017-02-24 DIAGNOSIS — B3731 Acute candidiasis of vulva and vagina: Secondary | ICD-10-CM

## 2017-02-24 DIAGNOSIS — K5900 Constipation, unspecified: Secondary | ICD-10-CM

## 2017-02-24 DIAGNOSIS — B373 Candidiasis of vulva and vagina: Secondary | ICD-10-CM | POA: Diagnosis not present

## 2017-02-24 DIAGNOSIS — O219 Vomiting of pregnancy, unspecified: Secondary | ICD-10-CM | POA: Diagnosis not present

## 2017-02-24 DIAGNOSIS — O161 Unspecified maternal hypertension, first trimester: Secondary | ICD-10-CM | POA: Insufficient documentation

## 2017-02-24 DIAGNOSIS — O98811 Other maternal infectious and parasitic diseases complicating pregnancy, first trimester: Secondary | ICD-10-CM | POA: Diagnosis not present

## 2017-02-24 LAB — URINALYSIS, ROUTINE W REFLEX MICROSCOPIC
Glucose, UA: NEGATIVE mg/dL
Hgb urine dipstick: NEGATIVE
KETONES UR: 40 mg/dL — AB
Leukocytes, UA: NEGATIVE
NITRITE: NEGATIVE
Protein, ur: 30 mg/dL — AB
Specific Gravity, Urine: 1.025 (ref 1.005–1.030)
pH: 6 (ref 5.0–8.0)

## 2017-02-24 LAB — URINALYSIS, MICROSCOPIC (REFLEX)

## 2017-02-24 LAB — WET PREP, GENITAL
SPERM: NONE SEEN
Trich, Wet Prep: NONE SEEN

## 2017-02-24 MED ORDER — PROMETHAZINE HCL 25 MG PO TABS: 25.0000 mg | ORAL_TABLET | Freq: Four times a day (QID) | ORAL | 0 refills | Status: DC | PRN

## 2017-02-24 MED ORDER — METRONIDAZOLE 0.75 % VA GEL: 1.0000 | Freq: Two times a day (BID) | VAGINAL | 0 refills | Status: DC

## 2017-02-24 MED ORDER — TERCONAZOLE 0.8 % VA CREA: 1.0000 | TOPICAL_CREAM | Freq: Every day | VAGINAL | 0 refills | Status: DC

## 2017-02-24 MED ORDER — DOCUSATE SODIUM 100 MG PO CAPS: 100.0000 mg | ORAL_CAPSULE | Freq: Two times a day (BID) | ORAL | 0 refills | Status: DC

## 2017-02-24 NOTE — MAU Provider Note (Signed)
History     CSN: [id number]  Arrival date and time: 02/24/17 1429  First Provider Initiated Contact with Patient 02/24/17 1535      Chief Complaint  Patient presents with  . Abdominal Pain   HPI  [name] [name] is a 26 y.o. G4P3003 at 3950w3d by LMP who presents with lower abdominal cramping, nausea, and vaginal discharge. Reports lower abdominal cramping intermittently x 1 month. Rates pain 9/10. Has not treated. Nothing makes better or worse. Has had continued nausea & vomiting. No vomiting in the last few days & currently not nauseated. Does not have antiemetic at home. Vaginal discharge x 1 week. Thin discharge with foul odor. No vaginal irritation or bleeding. Has been constipated for the las 2 week. Last BM was 3 days ago. Not treating symptoms.  Denies vaginal bleeding, dysuria, fever, dyspareunia, or postcoital bleeding. Has not started prenatal care. Delivered last baby in November.   OB History    Gravida Para Term Preterm AB Living   4 3 3  0 0 3   SAB TAB Ectopic Multiple Live Births   0 0 0 0 3      Past Medical History:  Diagnosis Date  . Anemia   . Anxiety   . Chlamydia 10-2011  . Cholecystitis   . Headache(784.0)   . Heart palpitations   . Hypertension     Past Surgical History:  Procedure Laterality Date  . ORIF ANKLE FRACTURE Left 02/12/2016   Procedure: OPEN REDUCTION INTERNAL FIXATION (ORIF) ANKLE FRACTURE;  Surgeon: Myrene GalasMichael Handy, MD;  [facility];  Service: Orthopedics;  Laterality: Left;    Family History  Problem Relation Age of Onset  . Diabetes Maternal Grandmother   . Hypertension Mother   . Anesthesia problems Neg Hx   . Other Neg Hx     Social History  Substance Use Topics  . Smoking status: Current Every Day Smoker    Packs/day: 0.25    Years: 3.00    Types: Cigarettes  . Smokeless tobacco: Never Used  . Alcohol use No     Comment: socially    Allergies:  Allergies  Allergen Reactions  . Metronidazole Nausea And Vomiting     No prescriptions prior to admission.    Review of Systems  Constitutional: Negative.   Gastrointestinal: Positive for abdominal pain, constipation and nausea. Negative for abdominal distention, diarrhea and vomiting.  Genitourinary: Positive for vaginal discharge. Negative for dyspareunia, dysuria and vaginal bleeding.   Physical Exam   Blood pressure 136/77, pulse 88, temperature 98.3 F (36.8 C), temperature source Oral, resp. rate 18, weight 175 lb (79.4 kg), last menstrual period 11/29/2016, SpO2 96 %, unknown if currently breastfeeding.  Physical Exam  Nursing note and vitals reviewed. Constitutional: She is oriented to person, place, and time. She appears well-developed and well-nourished. No distress.  HENT:  Head: Normocephalic and atraumatic.  Eyes: Conjunctivae are normal. Right eye exhibits no discharge. Left eye exhibits no discharge. No scleral icterus.  Neck: Normal range of motion.  Cardiovascular: Normal rate, regular rhythm and normal heart sounds.   No murmur heard. Respiratory: Effort normal and breath sounds normal. No respiratory distress. She has no wheezes.  GI: Soft. Bowel sounds are normal. There is no tenderness.  Genitourinary: Uterus is enlarged. Cervix exhibits no motion tenderness and no friability. There is erythema in the vagina. No bleeding in the vagina. Vaginal discharge (minimal amount of yellow clumpy discharge & small amount of clear watery discharge) found.  Genitourinary Comments: Cervix  closed  Neurological: She is alert and oriented to person, place, and time.  Skin: Skin is warm and dry. She is not diaphoretic.  Psychiatric: She has a normal mood and affect. Her behavior is normal. Judgment and thought content normal.    MAU Course  Procedures Results for orders placed or performed during the hospital encounter of 02/24/17 (from the past 24 hour(s))  Urinalysis, Routine w reflex microscopic     Status: Abnormal   Collection Time:  02/24/17  2:51 PM  Result Value Ref Range   Color, Urine ORANGE (A) YELLOW   APPearance HAZY (A) CLEAR   Specific Gravity, Urine 1.025 1.005 - 1.030   pH 6.0 5.0 - 8.0   Glucose, UA NEGATIVE NEGATIVE mg/dL   Hgb urine dipstick NEGATIVE NEGATIVE   Bilirubin Urine SMALL (A) NEGATIVE   Ketones, ur 40 (A) NEGATIVE mg/dL   Protein, ur 30 (A) NEGATIVE mg/dL   Nitrite NEGATIVE NEGATIVE   Leukocytes, UA NEGATIVE NEGATIVE  Urinalysis, Microscopic (reflex)     Status: Abnormal   Collection Time: 02/24/17  2:51 PM  Result Value Ref Range   RBC / HPF 0-5 0 - 5 RBC/hpf   WBC, UA 0-5 0 - 5 WBC/hpf   Bacteria, UA FEW (A) NONE SEEN   Squamous Epithelial / LPF 0-5 (A) NONE SEEN   Mucous PRESENT   Wet prep, genital     Status: Abnormal   Collection Time: 02/24/17  3:50 PM  Result Value Ref Range   Yeast Wet Prep HPF POC PRESENT (A) NONE SEEN   Trich, Wet Prep NONE SEEN NONE SEEN   Clue Cells Wet Prep HPF POC PRESENT (A) NONE SEEN   WBC, Wet Prep HPF POC MANY (A) NONE SEEN   Sperm NONE SEEN     MDM FHT 159 GC/CT & wet prep Cervix closed Pain resolved without intervention  Assessment and Plan  A:  1. Nausea and vomiting during pregnancy prior to [redacted] weeks gestation   2. Fetal heart tones present, first trimester   3. BV (bacterial vaginosis)   4. Vaginal yeast infection   5. Constipation during pregnancy in first trimester    P: Discharge home Rx metrogel, terazol, colace, phenergan Discussed tx of constipation Discussed reasons to return to MAU Start prenatal care GC/CT pending   Judeth Horn 02/24/2017, 3:34 PM

## 2017-02-24 NOTE — Discharge Instructions (Signed)

## 2017-02-24 NOTE — Progress Notes (Addendum)
g3P3 @? Ga. Presents to triage for cramping "upset stomach". No PNC. States last time had monthly period was back in Feb 27. UPT done. Denies bleeding.   1523: doppler 159  1628: discharge orders received.   1630: Discharge instructions given with pt understanding. Pt left unit via ambulatory with family.

## 2017-02-24 NOTE — MAU Note (Signed)
No PNC LMP 11/29/2016 +HPT in April +abdominal pain; cramping in nature; comes and goes; has not taken anything for the pain. +vaginal discharge that white and thick +odor Denies vaginal bleeding

## 2017-02-28 LAB — GC/CHLAMYDIA PROBE AMP (~~LOC~~) NOT AT ARMC
Chlamydia: NEGATIVE
Neisseria Gonorrhea: NEGATIVE

## 2017-03-13 ENCOUNTER — Emergency Department (HOSPITAL_COMMUNITY): Payer: Medicaid Other

## 2017-03-13 ENCOUNTER — Encounter (HOSPITAL_COMMUNITY): Payer: Self-pay | Admitting: Emergency Medicine

## 2017-03-13 ENCOUNTER — Emergency Department (HOSPITAL_COMMUNITY)
Admission: EM | Admit: 2017-03-13 | Discharge: 2017-03-13 | Disposition: A | Discharge status: Home / Self Care | Payer: Medicaid Other | Attending: Emergency Medicine | Admitting: Emergency Medicine

## 2017-03-13 DIAGNOSIS — Z79899 Other long term (current) drug therapy: Secondary | ICD-10-CM | POA: Insufficient documentation

## 2017-03-13 DIAGNOSIS — S20219A Contusion of unspecified front wall of thorax, initial encounter: Secondary | ICD-10-CM | POA: Diagnosis not present

## 2017-03-13 DIAGNOSIS — F17210 Nicotine dependence, cigarettes, uncomplicated: Secondary | ICD-10-CM | POA: Diagnosis not present

## 2017-03-13 DIAGNOSIS — Y92410 Unspecified street and highway as the place of occurrence of the external cause: Secondary | ICD-10-CM | POA: Insufficient documentation

## 2017-03-13 DIAGNOSIS — S20311A Abrasion of right front wall of thorax, initial encounter: Secondary | ICD-10-CM | POA: Diagnosis not present

## 2017-03-13 DIAGNOSIS — Y939 Activity, unspecified: Secondary | ICD-10-CM | POA: Insufficient documentation

## 2017-03-13 DIAGNOSIS — S62664A Nondisplaced fracture of distal phalanx of right ring finger, initial encounter for closed fracture: Secondary | ICD-10-CM | POA: Diagnosis not present

## 2017-03-13 DIAGNOSIS — R1030 Lower abdominal pain, unspecified: Secondary | ICD-10-CM | POA: Insufficient documentation

## 2017-03-13 DIAGNOSIS — S6991XA Unspecified injury of right wrist, hand and finger(s), initial encounter: Secondary | ICD-10-CM | POA: Diagnosis present

## 2017-03-13 DIAGNOSIS — I10 Essential (primary) hypertension: Secondary | ICD-10-CM | POA: Insufficient documentation

## 2017-03-13 DIAGNOSIS — T07XXXA Unspecified multiple injuries, initial encounter: Secondary | ICD-10-CM

## 2017-03-13 DIAGNOSIS — Y999 Unspecified external cause status: Secondary | ICD-10-CM | POA: Insufficient documentation

## 2017-03-13 LAB — URINALYSIS, ROUTINE W REFLEX MICROSCOPIC
Glucose, UA: NEGATIVE mg/dL
HGB URINE DIPSTICK: NEGATIVE
Ketones, ur: 20 mg/dL — AB
NITRITE: NEGATIVE
PH: 6 (ref 5.0–8.0)
Protein, ur: 100 mg/dL — AB
SPECIFIC GRAVITY, URINE: 1.028 (ref 1.005–1.030)

## 2017-03-13 MED ORDER — CYCLOBENZAPRINE HCL 5 MG PO TABS: 5.0000 mg | ORAL_TABLET | Freq: Three times a day (TID) | ORAL | 0 refills | Status: DC | PRN
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
## 2017-03-13 NOTE — Discharge Instructions (Signed)
Return if you have any problems. Take tylenol as needed.
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
## 2017-03-13 NOTE — ED Notes (Signed)
Patient transported to Ultrasound 
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
## 2017-03-13 NOTE — Progress Notes (Signed)
Orthopedic Tech Progress Note Patient Details:  [name] [name] 1990/11/28 [id number]  Ortho Devices Type of Ortho Device: Finger splint Ortho Device/Splint Location: applied finger splint static to pt right hand (ring finger)  pt tolerated fair.   right hand finger splint Static (ring finger) Ortho Device/Splint Interventions: Application, Adjustment   Alvina ChouWilliams, Jaidah Lomax C 03/13/2017, 9:51 PM

## 2017-03-14 NOTE — ED Notes (Signed)
Contacted patient via phone @ 0228 on 03/14/2017 regarding follow-up with hand surgery for her hand injury at the request of Kerrie BuffaloHope Neese, NP. Patient stated that she already received this information. Apologies made to patient for disturbing her in the middle of the night.

## 2017-03-21 ENCOUNTER — Encounter: Payer: Medicaid Other | Admitting: Obstetrics

## 2017-04-10 ENCOUNTER — Encounter: Payer: Self-pay | Admitting: Obstetrics and Gynecology

## 2017-04-10 ENCOUNTER — Other Ambulatory Visit (HOSPITAL_COMMUNITY)
Admission: RE | Admit: 2017-04-10 | Discharge: 2017-04-10 | Disposition: A | Discharge status: Home / Self Care | Payer: Medicaid Other | Source: Ambulatory Visit | Attending: Obstetrics and Gynecology | Admitting: Obstetrics and Gynecology

## 2017-04-10 ENCOUNTER — Ambulatory Visit (INDEPENDENT_AMBULATORY_CARE_PROVIDER_SITE_OTHER): Payer: Medicaid Other | Admitting: Obstetrics and Gynecology

## 2017-04-10 VITALS — BP 120/77 | HR 90 | Wt 180.2 lb

## 2017-04-10 DIAGNOSIS — Z3482 Encounter for supervision of other normal pregnancy, second trimester: Secondary | ICD-10-CM | POA: Diagnosis not present

## 2017-04-10 DIAGNOSIS — Z3490 Encounter for supervision of normal pregnancy, unspecified, unspecified trimester: Secondary | ICD-10-CM | POA: Insufficient documentation

## 2017-04-10 DIAGNOSIS — Z8759 Personal history of other complications of pregnancy, childbirth and the puerperium: Secondary | ICD-10-CM | POA: Insufficient documentation

## 2017-04-10 DIAGNOSIS — Z3492 Encounter for supervision of normal pregnancy, unspecified, second trimester: Secondary | ICD-10-CM | POA: Insufficient documentation

## 2017-04-10 DIAGNOSIS — O09899 Supervision of other high risk pregnancies, unspecified trimester: Secondary | ICD-10-CM | POA: Insufficient documentation

## 2017-04-10 MED ORDER — PREPLUS 27-1 MG PO TABS: 1.0000 | ORAL_TABLET | Freq: Every day | ORAL | 13 refills | Status: DC

## 2017-04-10 NOTE — Addendum Note (Signed)
Addended by: Hermina StaggersERVIN, MICHAEL L on: 04/10/2017 02:20 PM   Modules accepted: Orders

## 2017-04-10 NOTE — Patient Instructions (Signed)

## 2017-04-10 NOTE — Progress Notes (Signed)
Subjective:  [name] [name] is a 26 y.o. [phone] at 19w6dbeing seen today for first OB visit. She has no complaints today. H/O GHTN with last pregnancy. Short interval between pregnancy. She is currently monitored for the following issues for this low-risk pregnancy and has Murmur, cardiac; Encounter for supervision of normal pregnancy, unspecified, unspecified trimester; History of gestational hypertension; and Short interval between pregnancies affecting pregnancy, antepartum on her problem list.  Patient reports no complaints.  Contractions: Not present. Vag. Bleeding: None.  Movement: Present. Denies leaking of fluid.   The following portions of the patient's history were reviewed and updated as appropriate: allergies, current medications, past family history, past medical history, past social history, past surgical history and problem list. Problem list updated.  Objective:   Vitals:   04/10/17 1328  BP: 120/77  Pulse: 90  Weight: 180 lb 3.2 oz (81.7 kg)    Fetal Status: Fetal Heart Rate (bpm): 145   Movement: Present     General:  Alert, oriented and cooperative. Patient is in no acute distress.  Skin: Skin is warm and dry. No rash noted.   Cardiovascular: Normal heart rate noted  Respiratory: Normal respiratory effort, no problems with respiration noted  Abdomen: Soft, gravid, appropriate for gestational age. Pain/Pressure: Absent     Pelvic:  Cervical exam performed        Extremities: Normal range of motion.  Edema: None  Mental Status: Normal mood and affect. Normal behavior. Normal judgment and thought content.   Urinalysis:      Assessment and Plan:  Pregnancy: G4P3003 at 176w6d1. Encounter for supervision of normal pregnancy, antepartum, unspecified gravidity Prenatal care and labs reviewed with pt. - Culture, OB Urine - AFP TETRA - Cystic Fibrosis Mutation 97 - Hemoglobinopathy evaluation - Obstetric Panel, Including HIV - Cytology - PAP - VITAMIN D 25 Hydroxy  (Vit-D Deficiency, Fractures) - Varicella zoster antibody, IgG - [place] OB COMP + 14 WK; Future  2. History of gestational hypertension  - Comp Met (CMET) - Protein / creatinine ratio, urine  3. Short interval between pregnancies affecting pregnancy, antepartum   Preterm labor symptoms and general obstetric precautions including but not limited to vaginal bleeding, contractions, leaking of fluid and fetal movement were reviewed in detail with the patient. Please refer to After Visit Summary for other counseling recommendations.  Return in about 4 weeks (around 05/08/2017) for OB visit.   ErChancy MilroyMD

## 2017-04-11 LAB — CERVICOVAGINAL ANCILLARY ONLY
Bacterial vaginitis: POSITIVE — AB
CANDIDA VAGINITIS: POSITIVE — AB
CHLAMYDIA, DNA PROBE: NEGATIVE
Neisseria Gonorrhea: NEGATIVE
TRICH (WINDOWPATH): NEGATIVE

## 2017-04-11 LAB — CYTOLOGY - PAP: DIAGNOSIS: NEGATIVE

## 2017-04-11 LAB — PROTEIN / CREATININE RATIO, URINE
CREATININE, UR: 253.7 mg/dL
Protein, Ur: 32 mg/dL
Protein/Creat Ratio: 126 mg/g creat (ref 0–200)

## 2017-04-12 ENCOUNTER — Telehealth: Payer: Self-pay

## 2017-04-12 DIAGNOSIS — Z3400 Encounter for supervision of normal first pregnancy, unspecified trimester: Secondary | ICD-10-CM

## 2017-04-12 MED ORDER — FLUCONAZOLE 150 MG PO TABS: 150.0000 mg | ORAL_TABLET | Freq: Once | ORAL | 0 refills | Status: AC

## 2017-04-12 MED ORDER — TERCONAZOLE 0.4 % VA CREA: 1.0000 | TOPICAL_CREAM | Freq: Every day | VAGINAL | 0 refills | Status: DC

## 2017-04-12 NOTE — Telephone Encounter (Signed)
-----   Message from Hermina StaggersMichael L Ervin, MD sent at 04/12/2017 11:51 AM EDT ----- Flagyl 500 mg po bid x 7 days Diflucan 150 mg po now and repeat in 3 days Thanks Casimiro NeedleMichael

## 2017-04-12 NOTE — Telephone Encounter (Signed)
-----   Message from Michael L Ervin, MD sent at 04/12/2017 11:51 AM EDT ----- Flagyl 500 mg po bid x 7 days Diflucan 150 mg po now and repeat in 3 days Thanks Michael 

## 2017-04-12 NOTE — Progress Notes (Signed)
Patient notified of results and Rx. 

## 2017-04-12 NOTE — Telephone Encounter (Signed)
-----   Message from Hermina StaggersMichael L Ervin, MD sent at 04/12/2017  2:20 PM EDT ----- Metrogel vaginal bream 1 applicator qhs x 7 days Thanks Casimiro NeedleMichael  ----- Message ----- From: Maretta BeesMcGlashan, [place] Hohn J, RMA Sent: 04/12/2017   2:06 PM To: Hermina StaggersMichael L Ervin, MD  Patient prefers the vaginal cream for the BV, because the pill makes her sick. Please advice.

## 2017-04-12 NOTE — Telephone Encounter (Signed)
Patient notified of results and Rx. 

## 2017-04-13 LAB — OBSTETRIC PANEL, INCLUDING HIV
BASOS ABS: 0 10*3/uL (ref 0.0–0.2)
Basos: 0 %
EOS (ABSOLUTE): 0.1 10*3/uL (ref 0.0–0.4)
Eos: 1 %
HEP B S AG: NEGATIVE
HIV Screen 4th Generation wRfx: NONREACTIVE
Hematocrit: 33 % — ABNORMAL LOW (ref 34.0–46.6)
Hemoglobin: 10.1 g/dL — ABNORMAL LOW (ref 11.1–15.9)
IMMATURE GRANS (ABS): 0 10*3/uL (ref 0.0–0.1)
IMMATURE GRANULOCYTES: 0 %
LYMPHS: 24 %
Lymphocytes Absolute: 2.2 10*3/uL (ref 0.7–3.1)
MCH: 22.2 pg — ABNORMAL LOW (ref 26.6–33.0)
MCHC: 30.6 g/dL — ABNORMAL LOW (ref 31.5–35.7)
MCV: 73 fL — ABNORMAL LOW (ref 79–97)
MONOCYTES: 7 %
Monocytes Absolute: 0.7 10*3/uL (ref 0.1–0.9)
NEUTROS ABS: 6.2 10*3/uL (ref 1.4–7.0)
NEUTROS PCT: 68 %
PLATELETS: 331 10*3/uL (ref 150–379)
RBC: 4.55 x10E6/uL (ref 3.77–5.28)
RDW: 16.8 % — AB (ref 12.3–15.4)
RPR: NONREACTIVE
RUBELLA: 2.95 {index} (ref >0.99)
Rh Factor: POSITIVE
WBC: 9.2 10*3/uL (ref 3.4–10.8)

## 2017-04-13 LAB — COMPREHENSIVE METABOLIC PANEL
A/G RATIO: 1.1 — AB (ref 1.2–2.2)
ALT: 6 IU/L (ref 0–32)
AST: 11 IU/L (ref 0–40)
Albumin: 3.5 g/dL (ref 3.5–5.5)
Alkaline Phosphatase: 53 IU/L (ref 39–117)
BUN/Creatinine Ratio: 7 — ABNORMAL LOW (ref 9–23)
BUN: 3 mg/dL — AB (ref 6–20)
Bilirubin Total: <0.2 mg/dL (ref 0.0–1.2)
CALCIUM: 8.5 mg/dL — AB (ref 8.7–10.2)
CHLORIDE: 100 mmol/L (ref 96–106)
CO2: 20 mmol/L (ref 20–29)
Creatinine, Ser: 0.46 mg/dL — ABNORMAL LOW (ref 0.57–1.00)
GFR calc Af Amer: 161 mL/min/{1.73_m2} (ref >59)
GFR, EST NON AFRICAN AMERICAN: 140 mL/min/{1.73_m2} (ref >59)
GLUCOSE: 55 mg/dL — AB (ref 65–99)
Globulin, Total: 3.3 g/dL (ref 1.5–4.5)
POTASSIUM: 3.7 mmol/L (ref 3.5–5.2)
Sodium: 137 mmol/L (ref 134–144)
Total Protein: 6.8 g/dL (ref 6.0–8.5)

## 2017-04-13 LAB — AFP TETRA
DIA MOM VALUE: 1.09
DIA VALUE (EIA): 173.49 pg/mL
DSR (BY AGE) 1 IN: 1056
DSR (Second Trimester) 1 IN: 5484
Gestational Age: 19 WEEKS
MSAFP Mom: 1.25
MSAFP: 60 ng/mL
MSHCG MOM: 0.49
MSHCG: 10859 m[IU]/mL
Maternal Age At EDD: 24.4 yr
OSB RISK: 10000
T18 (By Age): 1:4113 {titer}
TEST RESULTS AFP: NEGATIVE
UE3 VALUE: 0.79 ng/mL
Weight: 180 [lb_av]
uE3 Mom: 0.54

## 2017-04-13 LAB — VARICELLA ZOSTER ANTIBODY, IGG

## 2017-04-13 LAB — HEMOGLOBINOPATHY EVALUATION
HGB C: 0 %
HGB S: 0 %
HGB VARIANT: 0 %
Hemoglobin A2 Quantitation: 2.4 % (ref 1.8–3.2)
Hemoglobin F Quantitation: 0 % (ref 0.0–2.0)
Hgb A: 97.6 % (ref 96.4–98.8)

## 2017-04-13 LAB — VITAMIN D 25 HYDROXY (VIT D DEFICIENCY, FRACTURES): VIT D 25 HYDROXY: 6.5 ng/mL — AB (ref 30.0–100.0)

## 2017-04-13 LAB — AB SCR+ANTIBODY ID

## 2017-04-14 LAB — CULTURE, OB URINE

## 2017-04-14 LAB — URINE CULTURE, OB REFLEX

## 2017-04-19 ENCOUNTER — Other Ambulatory Visit: Payer: Self-pay | Admitting: *Deleted

## 2017-04-19 DIAGNOSIS — N390 Urinary tract infection, site not specified: Secondary | ICD-10-CM

## 2017-04-19 MED ORDER — AMPICILLIN 500 MG PO CAPS: 500.0000 mg | ORAL_CAPSULE | Freq: Three times a day (TID) | ORAL | 0 refills | Status: DC

## 2017-04-19 NOTE — Progress Notes (Signed)
See lab result

## 2017-04-20 ENCOUNTER — Ambulatory Visit (HOSPITAL_COMMUNITY)
Admission: RE | Admit: 2017-04-20 | Discharge: 2017-04-20 | Disposition: A | Discharge status: Home / Self Care | Payer: Medicaid Other | Source: Ambulatory Visit | Attending: Obstetrics and Gynecology | Admitting: Obstetrics and Gynecology

## 2017-04-20 ENCOUNTER — Other Ambulatory Visit: Payer: Self-pay | Admitting: Obstetrics and Gynecology

## 2017-04-20 DIAGNOSIS — Z3490 Encounter for supervision of normal pregnancy, unspecified, unspecified trimester: Secondary | ICD-10-CM

## 2017-04-20 DIAGNOSIS — Z3A20 20 weeks gestation of pregnancy: Secondary | ICD-10-CM

## 2017-04-20 DIAGNOSIS — Z363 Encounter for antenatal screening for malformations: Secondary | ICD-10-CM | POA: Diagnosis not present

## 2017-04-24 ENCOUNTER — Other Ambulatory Visit: Payer: Self-pay | Admitting: *Deleted

## 2017-04-24 DIAGNOSIS — B951 Streptococcus, group B, as the cause of diseases classified elsewhere: Secondary | ICD-10-CM | POA: Insufficient documentation

## 2017-04-24 DIAGNOSIS — O2340 Unspecified infection of urinary tract in pregnancy, unspecified trimester: Secondary | ICD-10-CM

## 2017-04-24 DIAGNOSIS — N390 Urinary tract infection, site not specified: Secondary | ICD-10-CM

## 2017-04-24 MED ORDER — AMPICILLIN 500 MG PO CAPS: ORAL_CAPSULE | ORAL | 0 refills | Status: DC

## 2017-04-25 LAB — CYSTIC FIBROSIS MUTATION 97: Interpretation: NOT DETECTED

## 2017-05-03 ENCOUNTER — Telehealth: Payer: Self-pay

## 2017-05-03 DIAGNOSIS — N760 Acute vaginitis: Principal | ICD-10-CM

## 2017-05-03 DIAGNOSIS — B9689 Other specified bacterial agents as the cause of diseases classified elsewhere: Secondary | ICD-10-CM

## 2017-05-03 MED ORDER — METRONIDAZOLE 0.75 % VA GEL: 1.0000 | Freq: Two times a day (BID) | VAGINAL | 0 refills | Status: AC

## 2017-05-03 NOTE — Telephone Encounter (Signed)
Pt called wanting a rx for bv. States that the last rx did not clear the symptoms up completely. After reviewing rx with pt. She never picked up the metrogel, and the rx is expired now. Rx resent

## 2017-05-08 ENCOUNTER — Encounter: Payer: Medicaid Other | Admitting: Obstetrics and Gynecology

## 2017-05-11 ENCOUNTER — Ambulatory Visit (INDEPENDENT_AMBULATORY_CARE_PROVIDER_SITE_OTHER): Payer: Medicaid Other | Admitting: Obstetrics and Gynecology

## 2017-05-11 VITALS — BP 122/74 | HR 88 | Wt 189.0 lb

## 2017-05-11 DIAGNOSIS — O09892 Supervision of other high risk pregnancies, second trimester: Secondary | ICD-10-CM

## 2017-05-11 DIAGNOSIS — Z3490 Encounter for supervision of normal pregnancy, unspecified, unspecified trimester: Secondary | ICD-10-CM

## 2017-05-11 DIAGNOSIS — Z3492 Encounter for supervision of normal pregnancy, unspecified, second trimester: Secondary | ICD-10-CM

## 2017-05-11 DIAGNOSIS — B951 Streptococcus, group B, as the cause of diseases classified elsewhere: Secondary | ICD-10-CM

## 2017-05-11 DIAGNOSIS — O2342 Unspecified infection of urinary tract in pregnancy, second trimester: Secondary | ICD-10-CM

## 2017-05-11 DIAGNOSIS — Z8759 Personal history of other complications of pregnancy, childbirth and the puerperium: Secondary | ICD-10-CM

## 2017-05-11 DIAGNOSIS — O09899 Supervision of other high risk pregnancies, unspecified trimester: Secondary | ICD-10-CM

## 2017-05-11 MED ORDER — ONDANSETRON 4 MG PO TBDP: 4.0000 mg | ORAL_TABLET | Freq: Four times a day (QID) | ORAL | 1 refills | Status: DC | PRN

## 2017-05-11 NOTE — Progress Notes (Signed)
Subjective:  [name] [name] is a 26 y.o. [phone] at 5634w2d being seen today for ongoing prenatal care.  She is currently monitored for the following issues for this high-risk pregnancy and has Murmur, cardiac; Encounter for supervision of normal pregnancy, unspecified, unspecified trimester; History of gestational hypertension; Short interval between pregnancies affecting pregnancy, antepartum; and Group B streptococcus urinary tract infection affecting pregnancy on her problem list.  Patient reports no complaints.  Contractions: Irregular. Vag. Bleeding: None.  Movement: Present. Denies leaking of fluid.   The following portions of the patient's history were reviewed and updated as appropriate: allergies, current medications, past family history, past medical history, past social history, past surgical history and problem list. Problem list updated.  Objective:   Vitals:   05/11/17 1620  BP: 122/74  Pulse: 88  Weight: 189 lb (85.7 kg)    Fetal Status: Fetal Heart Rate (bpm): 140 Fundal Height: 24 cm Movement: Present     General:  Alert, oriented and cooperative. Patient is in no acute distress.  Skin: Skin is warm and dry. No rash noted.   Cardiovascular: Normal heart rate noted  Respiratory: Normal respiratory effort, no problems with respiration noted  Abdomen: Soft, gravid, appropriate for gestational age. Pain/Pressure: Absent     Pelvic:  Cervical exam deferred Dilation: Closed      Extremities: Normal range of motion.  Edema: None  Mental Status: Normal mood and affect. Normal behavior. Normal judgment and thought content.   Urinalysis:      Assessment and Plan:  Pregnancy: G4P3003 at 1934w2d  1. Encounter for supervision of normal pregnancy, antepartum, unspecified gravidity  - ondansetron (ZOFRAN ODT) 4 MG disintegrating tablet; Take 1 tablet (4 mg total) by mouth every 6 (six) hours as needed for nausea.  Dispense: 20 tablet; Refill: 1  2. Short interval between pregnancies  affecting pregnancy, antepartum   3. History of gestational hypertension BP stable  4. Group B Streptococcus urinary tract infection affecting pregnancy in second trimester Tx while in labor  Preterm labor symptoms and general obstetric precautions including but not limited to vaginal bleeding, contractions, leaking of fluid and fetal movement were reviewed in detail with the patient. Please refer to After Visit Summary for other counseling recommendations.  Return in about 4 weeks (around 06/08/2017) for OB visit.   Hermina StaggersErvin, Rohen Kimes L, MD

## 2017-06-08 ENCOUNTER — Other Ambulatory Visit: Payer: Medicaid Other

## 2017-06-08 ENCOUNTER — Ambulatory Visit (INDEPENDENT_AMBULATORY_CARE_PROVIDER_SITE_OTHER): Payer: Medicaid Other | Admitting: Obstetrics and Gynecology

## 2017-06-08 VITALS — BP 135/80 | HR 102 | Wt 196.6 lb

## 2017-06-08 DIAGNOSIS — B951 Streptococcus, group B, as the cause of diseases classified elsewhere: Secondary | ICD-10-CM

## 2017-06-08 DIAGNOSIS — Z8759 Personal history of other complications of pregnancy, childbirth and the puerperium: Secondary | ICD-10-CM

## 2017-06-08 DIAGNOSIS — O2342 Unspecified infection of urinary tract in pregnancy, second trimester: Secondary | ICD-10-CM

## 2017-06-08 DIAGNOSIS — Z23 Encounter for immunization: Secondary | ICD-10-CM

## 2017-06-08 DIAGNOSIS — Z3490 Encounter for supervision of normal pregnancy, unspecified, unspecified trimester: Secondary | ICD-10-CM

## 2017-06-08 DIAGNOSIS — Z3492 Encounter for supervision of normal pregnancy, unspecified, second trimester: Secondary | ICD-10-CM

## 2017-06-08 LAB — OB RESULTS CONSOLE GBS: GBS: POSITIVE

## 2017-06-08 MED ORDER — DOCUSATE SODIUM 100 MG PO CAPS: 100.0000 mg | ORAL_CAPSULE | Freq: Two times a day (BID) | ORAL | 2 refills | Status: DC | PRN

## 2017-06-08 NOTE — Progress Notes (Signed)
Subjective:  [name] [name] is a 26 y.o. [phone] at 244w2d being seen today for ongoing prenatal care.  She is currently monitored for the following issues for this high-risk pregnancy and has Murmur, cardiac; Encounter for supervision of normal pregnancy, unspecified, unspecified trimester; History of gestational hypertension; Short interval between pregnancies affecting pregnancy, antepartum; and Group B streptococcus urinary tract infection affecting pregnancy on her problem list.  Patient reports occasional contractions and constipation.  Contractions: Irregular. Vag. Bleeding: None.  Movement: Present. Denies leaking of fluid.   The following portions of the patient's history were reviewed and updated as appropriate: allergies, current medications, past family history, past medical history, past social history, past surgical history and problem list. Problem list updated.  Objective:   Vitals:   06/08/17 0843  BP: (!) 135/0  Pulse: (!) 102  Weight: 196 lb 9.6 oz (89.2 kg)    Fetal Status: Fetal Heart Rate (bpm): 141   Movement: Present     General:  Alert, oriented and cooperative. Patient is in no acute distress.  Skin: Skin is warm and dry. No rash noted.   Cardiovascular: Normal heart rate noted  Respiratory: Normal respiratory effort, no problems with respiration noted  Abdomen: Soft, gravid, appropriate for gestational age. Pain/Pressure: Present     Pelvic:  Cervical exam deferred        Extremities: Normal range of motion.  Edema: Mild pitting, slight indentation  Mental Status: Normal mood and affect. Normal behavior. Normal judgment and thought content.   Urinalysis:      Assessment and Plan:  Pregnancy: G4P3003 at 244w2d  1. Encounter for supervision of normal pregnancy, antepartum, unspecified gravidity Stable - Glucose Tolerance, 2 Hours w/1 Hour - CBC - HIV antibody (with reflex) - RPR - Tdap vaccine greater than or equal to 7yo IM - Flu Vaccine QUAD 36+ mos IM  (Fluarix, Quad PF)  2. Group B Streptococcus urinary tract infection affecting pregnancy in second trimester Tx while in labor  3. History of gestational hypertension BP stable  Preterm labor symptoms and general obstetric precautions including but not limited to vaginal bleeding, contractions, leaking of fluid and fetal movement were reviewed in detail with the patient. Please refer to After Visit Summary for other counseling recommendations.  Return in about 3 weeks (around 06/29/2017) for OB visit.   Hermina StaggersErvin, Sutton Plake L, MD

## 2017-06-08 NOTE — Progress Notes (Signed)
ROB/GTT. TDAP and FLU given in left deltoid. Tolerated well.

## 2017-06-09 LAB — GLUCOSE TOLERANCE, 2 HOURS W/ 1HR
GLUCOSE, 1 HOUR: 92 mg/dL (ref 65–179)
Glucose, 2 hour: 66 mg/dL (ref 65–152)
Glucose, Fasting: 71 mg/dL (ref 65–91)

## 2017-06-09 LAB — CBC
Hematocrit: 28.1 % — ABNORMAL LOW (ref 34.0–46.6)
Hemoglobin: 8.9 g/dL — ABNORMAL LOW (ref 11.1–15.9)
MCH: 22.5 pg — AB (ref 26.6–33.0)
MCHC: 31.7 g/dL (ref 31.5–35.7)
MCV: 71 fL — ABNORMAL LOW (ref 79–97)
PLATELETS: 320 10*3/uL (ref 150–379)
RBC: 3.95 x10E6/uL (ref 3.77–5.28)
RDW: 16.7 % — ABNORMAL HIGH (ref 12.3–15.4)
WBC: 11.4 10*3/uL — ABNORMAL HIGH (ref 3.4–10.8)

## 2017-06-09 LAB — RPR: RPR Ser Ql: NONREACTIVE

## 2017-06-09 LAB — HIV ANTIBODY (ROUTINE TESTING W REFLEX): HIV SCREEN 4TH GENERATION: NONREACTIVE

## 2017-06-23 ENCOUNTER — Other Ambulatory Visit (HOSPITAL_COMMUNITY)
Admission: RE | Admit: 2017-06-23 | Discharge: 2017-06-23 | Disposition: A | Discharge status: Home / Self Care | Payer: Medicaid Other | Source: Ambulatory Visit | Attending: Certified Nurse Midwife | Admitting: Certified Nurse Midwife

## 2017-06-23 ENCOUNTER — Ambulatory Visit (INDEPENDENT_AMBULATORY_CARE_PROVIDER_SITE_OTHER): Payer: Medicaid Other | Admitting: Certified Nurse Midwife

## 2017-06-23 VITALS — BP 132/66 | HR 108 | Temp 98.3°F | Wt 192.4 lb

## 2017-06-23 DIAGNOSIS — B951 Streptococcus, group B, as the cause of diseases classified elsewhere: Secondary | ICD-10-CM

## 2017-06-23 DIAGNOSIS — O2343 Unspecified infection of urinary tract in pregnancy, third trimester: Secondary | ICD-10-CM

## 2017-06-23 DIAGNOSIS — O09899 Supervision of other high risk pregnancies, unspecified trimester: Secondary | ICD-10-CM

## 2017-06-23 DIAGNOSIS — Z3483 Encounter for supervision of other normal pregnancy, third trimester: Secondary | ICD-10-CM | POA: Diagnosis not present

## 2017-06-23 DIAGNOSIS — Z8759 Personal history of other complications of pregnancy, childbirth and the puerperium: Secondary | ICD-10-CM

## 2017-06-23 DIAGNOSIS — N764 Abscess of vulva: Secondary | ICD-10-CM

## 2017-06-23 DIAGNOSIS — Z3480 Encounter for supervision of other normal pregnancy, unspecified trimester: Secondary | ICD-10-CM

## 2017-06-23 DIAGNOSIS — R011 Cardiac murmur, unspecified: Secondary | ICD-10-CM

## 2017-06-23 MED ORDER — MUPIROCIN CALCIUM 2 % EX CREA: 1.0000 "application " | TOPICAL_CREAM | Freq: Two times a day (BID) | CUTANEOUS | 0 refills | Status: DC

## 2017-06-23 MED ORDER — AMOXICILLIN-POT CLAVULANATE 875-125 MG PO TABS: 1.0000 | ORAL_TABLET | Freq: Two times a day (BID) | ORAL | 0 refills | Status: DC

## 2017-06-23 MED ORDER — ASPIRIN 81 MG PO CHEW: 81.0000 mg | CHEWABLE_TABLET | Freq: Every day | ORAL | 12 refills | Status: DC

## 2017-06-23 MED ORDER — BACIT-POLY-NEO HC 1 % EX OINT: 1.0000 "application " | TOPICAL_OINTMENT | Freq: Two times a day (BID) | CUTANEOUS | 2 refills | Status: DC

## 2017-06-23 NOTE — Progress Notes (Signed)
   PRENATAL VISIT NOTE  Subjective:  [name] [name] is a 26 y.o. [phone] at [redacted]w[redacted]d being seen today for ongoing prenatal care.  She is currently monitored for the following issues for this low-risk pregnancy and has Murmur, cardiac; Encounter for supervision of normal pregnancy, unspecified, unspecified trimester; History of gestational hypertension; Short interval between pregnancies affecting pregnancy, antepartum; and Group B streptococcus urinary tract infection affecting pregnancy on her problem list.  Patient reports no bleeding, no contractions, no cramping, no leaking and labial irriatation that stared a few days ago, painful sore, hx of FOB cheating on her with multiple women, no itching at the sore, has not had this before.  Contractions: Regular. Vag. Bleeding: None.  Movement: Present. Denies leaking of fluid.   The following portions of the patient's history were reviewed and updated as appropriate: allergies, current medications, past family history, past medical history, past social history, past surgical history and problem list. Problem list updated.  Objective:   Vitals:   06/23/17 1101  BP: 140/79  Pulse: (!) 108  Temp: 98.3 F (36.8 C)  Weight: 192 lb 6.4 oz (87.3 kg)    Fetal Status: Fetal Heart Rate (bpm): 142; doppler Fundal Height: 29 cm Movement: Present     General:  Alert, oriented and cooperative. Patient is in no acute distress.  Skin: Skin is warm and dry. No rash noted.   Cardiovascular: Normal heart rate noted  Respiratory: Normal respiratory effort, no problems with respiration noted  Abdomen: Soft, gravid, appropriate for gestational age.  Pain/Pressure: Present     Pelvic: Cervical exam deferred      Right labia towards introitus small lesion present, white sore, tender to palpation  Extremities: Normal range of motion.  Edema: Trace  Mental Status:  Normal mood and affect. Normal behavior. Normal judgment and thought content.   Assessment and Plan:    Pregnancy: G4P3003 at [redacted]w[redacted]d  1. Group B Streptococcus urinary tract infection affecting pregnancy in third trimester     PCN for labor/delivery  2. Supervision of other normal pregnancy, antepartum     Right labial abscess - Cervicovaginal ancillary only  3. History of gestational hypertension     Elevated blood pressure today.  Baseline labs obtained. Manual blood pressure improved.      Baby ASA ordered.   4. Short interval between pregnancies affecting pregnancy, antepartum      5. Murmur, cardiac     Normal EKG  6. Abscess of right genital labia      - amoxicillin-clavulanate (AUGMENTIN) 875-125 MG tablet; Take 1 tablet by mouth 2 (two) times daily.  Dispense: 14 tablet; Refill: 0 - mupirocin cream (BACTROBAN) 2 %; Apply 1 application topically 2 (two) times daily.  Dispense: 15 g; Refill: 0 - Anaerobic and Aerobic Culture   Preterm labor symptoms and general obstetric precautions including but not limited to vaginal bleeding, contractions, leaking of fluid and fetal movement were reviewed in detail with the patient. Please refer to After Visit Summary for other counseling recommendations.  Return in about 2 weeks (around 07/07/2017) for ROB, B/P check next week.   Roe Coombs, CNM

## 2017-06-23 NOTE — Progress Notes (Signed)
Patient presents for painful bump on vulva. Denies discharge and odor

## 2017-06-24 LAB — COMPREHENSIVE METABOLIC PANEL
A/G RATIO: 0.9 — AB (ref 1.2–2.2)
ALT: 8 IU/L (ref 0–32)
AST: 10 IU/L (ref 0–40)
Albumin: 3.1 g/dL — ABNORMAL LOW (ref 3.5–5.5)
Alkaline Phosphatase: 75 IU/L (ref 39–117)
BUN/Creatinine Ratio: 7 — ABNORMAL LOW (ref 9–23)
BUN: 3 mg/dL — ABNORMAL LOW (ref 6–20)
Bilirubin Total: <0.2 mg/dL (ref 0.0–1.2)
CALCIUM: 8.6 mg/dL — AB (ref 8.7–10.2)
CO2: 19 mmol/L — AB (ref 20–29)
CREATININE: 0.44 mg/dL — AB (ref 0.57–1.00)
Chloride: 105 mmol/L (ref 96–106)
GFR, EST AFRICAN AMERICAN: 163 mL/min/{1.73_m2} (ref >59)
GFR, EST NON AFRICAN AMERICAN: 142 mL/min/{1.73_m2} (ref >59)
GLOBULIN, TOTAL: 3.3 g/dL (ref 1.5–4.5)
Glucose: 101 mg/dL — ABNORMAL HIGH (ref 65–99)
Potassium: 3.5 mmol/L (ref 3.5–5.2)
Sodium: 138 mmol/L (ref 134–144)
TOTAL PROTEIN: 6.4 g/dL (ref 6.0–8.5)

## 2017-06-24 LAB — CBC WITH DIFFERENTIAL/PLATELET
BASOS: 0 %
Basophils Absolute: 0 10*3/uL (ref 0.0–0.2)
EOS (ABSOLUTE): 0.2 10*3/uL (ref 0.0–0.4)
Eos: 2 %
HEMATOCRIT: 28.7 % — AB (ref 34.0–46.6)
Hemoglobin: 8.7 g/dL — ABNORMAL LOW (ref 11.1–15.9)
IMMATURE GRANS (ABS): 0 10*3/uL (ref 0.0–0.1)
IMMATURE GRANULOCYTES: 0 %
LYMPHS: 25 %
Lymphocytes Absolute: 2.5 10*3/uL (ref 0.7–3.1)
MCH: 21.9 pg — ABNORMAL LOW (ref 26.6–33.0)
MCHC: 30.3 g/dL — ABNORMAL LOW (ref 31.5–35.7)
MCV: 72 fL — AB (ref 79–97)
Monocytes Absolute: 0.5 10*3/uL (ref 0.1–0.9)
Monocytes: 6 %
NEUTROS ABS: 6.5 10*3/uL (ref 1.4–7.0)
Neutrophils: 67 %
PLATELETS: 346 10*3/uL (ref 150–379)
RBC: 3.98 x10E6/uL (ref 3.77–5.28)
RDW: 17.3 % — ABNORMAL HIGH (ref 12.3–15.4)
WBC: 9.7 10*3/uL (ref 3.4–10.8)

## 2017-06-25 LAB — PROTEIN / CREATININE RATIO, URINE
CREATININE, UR: 259.1 mg/dL
PROTEIN UR: 66.5 mg/dL
PROTEIN/CREAT RATIO: 257 mg/g{creat} — AB (ref 0–200)

## 2017-06-26 LAB — CERVICOVAGINAL ANCILLARY ONLY
BACTERIAL VAGINITIS: NEGATIVE
CANDIDA VAGINITIS: NEGATIVE
CHLAMYDIA, DNA PROBE: NEGATIVE
Neisseria Gonorrhea: NEGATIVE
TRICH (WINDOWPATH): NEGATIVE

## 2017-06-27 ENCOUNTER — Other Ambulatory Visit: Payer: Self-pay | Admitting: Certified Nurse Midwife

## 2017-06-27 DIAGNOSIS — O99013 Anemia complicating pregnancy, third trimester: Secondary | ICD-10-CM

## 2017-06-27 LAB — TEST CODE CHANGE

## 2017-06-27 LAB — GENITAL CULTURE

## 2017-06-27 MED ORDER — CITRANATAL BLOOM 90-1 MG PO TABS: 1.0000 | ORAL_TABLET | Freq: Every day | ORAL | 12 refills | Status: DC

## 2017-06-29 ENCOUNTER — Ambulatory Visit (INDEPENDENT_AMBULATORY_CARE_PROVIDER_SITE_OTHER): Payer: Medicaid Other | Admitting: Certified Nurse Midwife

## 2017-06-29 VITALS — BP 133/78 | HR 88 | Wt 197.5 lb

## 2017-06-29 DIAGNOSIS — O2343 Unspecified infection of urinary tract in pregnancy, third trimester: Secondary | ICD-10-CM

## 2017-06-29 DIAGNOSIS — O09899 Supervision of other high risk pregnancies, unspecified trimester: Secondary | ICD-10-CM

## 2017-06-29 DIAGNOSIS — B951 Streptococcus, group B, as the cause of diseases classified elsewhere: Secondary | ICD-10-CM

## 2017-06-29 DIAGNOSIS — Z3480 Encounter for supervision of other normal pregnancy, unspecified trimester: Secondary | ICD-10-CM

## 2017-06-29 DIAGNOSIS — Z8759 Personal history of other complications of pregnancy, childbirth and the puerperium: Secondary | ICD-10-CM

## 2017-06-29 LAB — IRON AND TIBC
IRON: 30 ug/dL (ref 27–159)
Iron Saturation: 7 % — CL (ref 15–55)
Total Iron Binding Capacity: 420 ug/dL (ref 250–450)
UIBC: 390 ug/dL (ref 131–425)

## 2017-06-29 LAB — SPECIMEN STATUS REPORT

## 2017-06-29 LAB — FERRITIN: FERRITIN: 7 ng/mL — AB (ref 15–150)

## 2017-06-29 NOTE — Progress Notes (Signed)
   PRENATAL VISIT NOTE  Subjective:  [name] [name] is a 26 y.o. [phone] at [redacted]w[redacted]d being seen today for ongoing prenatal care.  She is currently monitored for the following issues for this low-risk pregnancy and has Murmur, cardiac; Encounter for supervision of normal pregnancy, unspecified, unspecified trimester; History of gestational hypertension; Short interval between pregnancies affecting pregnancy, antepartum; and Group B streptococcus urinary tract infection affecting pregnancy on her problem list.  Patient reports no complaints.  Contractions: Irregular. Vag. Bleeding: None.  Movement: Present. Denies leaking of fluid.   The following portions of the patient's history were reviewed and updated as appropriate: allergies, current medications, past family history, past medical history, past social history, past surgical history and problem list. Problem list updated.  Objective:   Vitals:   06/29/17 1451  BP: 133/78  Pulse: 88  Weight: 197 lb 8 oz (89.6 kg)    Fetal Status: Fetal Heart Rate (bpm): 136; doppler Fundal Height: 30 cm Movement: Present     General:  Alert, oriented and cooperative. Patient is in no acute distress.  Skin: Skin is warm and dry. No rash noted.   Cardiovascular: Normal heart rate noted  Respiratory: Normal respiratory effort, no problems with respiration noted  Abdomen: Soft, gravid, appropriate for gestational age.  Pain/Pressure: Absent     Pelvic: Cervical exam deferred        Extremities: Normal range of motion.  Edema: Trace  Mental Status:  Normal mood and affect. Normal behavior. Normal judgment and thought content.   Assessment and Plan:  Pregnancy: G4P3003 at [redacted]w[redacted]d  1. Group B Streptococcus urinary tract infection affecting pregnancy in third trimester     PCN for labor/delivery  2. History of gestational hypertension      Normotensive, baby asa  3. Supervision of other normal pregnancy, antepartum      States labial abscess is improved.    4. Short interval between pregnancies affecting pregnancy, antepartum         Preterm labor symptoms and general obstetric precautions including but not limited to vaginal bleeding, contractions, leaking of fluid and fetal movement were reviewed in detail with the patient. Please refer to After Visit Summary for other counseling recommendations.  Return in about 2 weeks (around 07/13/2017) for ROB.   Roe Coombs, CNM

## 2017-06-29 NOTE — Progress Notes (Signed)
Patient reports good fetal movement and reports some contractions yesterday.

## 2017-07-04 ENCOUNTER — Encounter: Payer: Self-pay | Admitting: Hematology

## 2017-07-04 ENCOUNTER — Telehealth: Payer: Self-pay | Admitting: Hematology

## 2017-07-04 NOTE — Telephone Encounter (Signed)
Appt has been scheduled for the pt to see Dr. Candise Che on 10/17 at 11am. Pt address verified. Letter mailed to the pt.

## 2017-07-06 ENCOUNTER — Encounter (HOSPITAL_COMMUNITY): Payer: Self-pay | Admitting: *Deleted

## 2017-07-06 ENCOUNTER — Inpatient Hospital Stay (HOSPITAL_COMMUNITY)
Admission: AD | Admit: 2017-07-06 | Discharge: 2017-07-06 | Disposition: A | Discharge status: Home / Self Care | Payer: Medicaid Other | Source: Ambulatory Visit | Attending: Obstetrics & Gynecology | Admitting: Obstetrics & Gynecology

## 2017-07-06 DIAGNOSIS — O99013 Anemia complicating pregnancy, third trimester: Secondary | ICD-10-CM | POA: Diagnosis not present

## 2017-07-06 DIAGNOSIS — O133 Gestational [pregnancy-induced] hypertension without significant proteinuria, third trimester: Secondary | ICD-10-CM | POA: Diagnosis not present

## 2017-07-06 DIAGNOSIS — O99333 Smoking (tobacco) complicating pregnancy, third trimester: Secondary | ICD-10-CM | POA: Insufficient documentation

## 2017-07-06 DIAGNOSIS — D649 Anemia, unspecified: Secondary | ICD-10-CM | POA: Insufficient documentation

## 2017-07-06 DIAGNOSIS — F17210 Nicotine dependence, cigarettes, uncomplicated: Secondary | ICD-10-CM | POA: Diagnosis not present

## 2017-07-06 DIAGNOSIS — O26893 Other specified pregnancy related conditions, third trimester: Secondary | ICD-10-CM

## 2017-07-06 DIAGNOSIS — R51 Headache: Secondary | ICD-10-CM | POA: Diagnosis present

## 2017-07-06 DIAGNOSIS — Z3A31 31 weeks gestation of pregnancy: Secondary | ICD-10-CM | POA: Insufficient documentation

## 2017-07-06 LAB — PROTEIN / CREATININE RATIO, URINE
CREATININE, URINE: 63 mg/dL
PROTEIN CREATININE RATIO: 0.14 mg/mg{creat} (ref 0.00–0.15)
TOTAL PROTEIN, URINE: 9 mg/dL

## 2017-07-06 LAB — COMPREHENSIVE METABOLIC PANEL
ALBUMIN: 2.3 g/dL — AB (ref 3.5–5.0)
ALT: 9 U/L — ABNORMAL LOW (ref 14–54)
ANION GAP: 8 (ref 5–15)
AST: 11 U/L — ABNORMAL LOW (ref 15–41)
Alkaline Phosphatase: 65 U/L (ref 38–126)
BILIRUBIN TOTAL: 0.2 mg/dL — AB (ref 0.3–1.2)
BUN: 5 mg/dL — ABNORMAL LOW (ref 6–20)
CO2: 22 mmol/L (ref 22–32)
Calcium: 7.9 mg/dL — ABNORMAL LOW (ref 8.9–10.3)
Chloride: 106 mmol/L (ref 101–111)
Creatinine, Ser: 0.31 mg/dL — ABNORMAL LOW (ref 0.44–1.00)
GFR calc Af Amer: >60 mL/min (ref >60)
GLUCOSE: 83 mg/dL (ref 65–99)
POTASSIUM: 3.4 mmol/L — AB (ref 3.5–5.1)
Sodium: 136 mmol/L (ref 135–145)
TOTAL PROTEIN: 6.5 g/dL (ref 6.5–8.1)

## 2017-07-06 LAB — URINALYSIS, ROUTINE W REFLEX MICROSCOPIC
Bilirubin Urine: NEGATIVE
GLUCOSE, UA: NEGATIVE mg/dL
HGB URINE DIPSTICK: NEGATIVE
Ketones, ur: NEGATIVE mg/dL
Leukocytes, UA: NEGATIVE
Nitrite: NEGATIVE
PROTEIN: NEGATIVE mg/dL
Specific Gravity, Urine: 1.01 (ref 1.005–1.030)
pH: 7 (ref 5.0–8.0)

## 2017-07-06 LAB — CBC
HEMATOCRIT: 25.1 % — AB (ref 36.0–46.0)
Hemoglobin: 8.2 g/dL — ABNORMAL LOW (ref 12.0–15.0)
MCH: 22.6 pg — ABNORMAL LOW (ref 26.0–34.0)
MCHC: 32.7 g/dL (ref 30.0–36.0)
MCV: 69.1 fL — ABNORMAL LOW (ref 78.0–100.0)
Platelets: 295 10*3/uL (ref 150–400)
RBC: 3.63 MIL/uL — ABNORMAL LOW (ref 3.87–5.11)
RDW: 15.4 % (ref 11.5–15.5)
WBC: 11 10*3/uL — ABNORMAL HIGH (ref 4.0–10.5)

## 2017-07-06 MED ORDER — DEXAMETHASONE SODIUM PHOSPHATE 10 MG/ML IJ SOLN
10.0000 mg | Freq: Once | INTRAMUSCULAR | Status: AC
  Administered 2017-07-06: 10 mg via INTRAVENOUS
  Filled 2017-07-06: qty 1

## 2017-07-06 MED ORDER — METOCLOPRAMIDE HCL 5 MG/ML IJ SOLN
10.0000 mg | Freq: Once | INTRAMUSCULAR | Status: AC
  Administered 2017-07-06: 10 mg via INTRAVENOUS
  Filled 2017-07-06: qty 2

## 2017-07-06 MED ORDER — BUTALBITAL-APAP-CAFFEINE 50-325-40 MG PO TABS
2.0000 | ORAL_TABLET | Freq: Once | ORAL | Status: AC
  Administered 2017-07-06: 2 via ORAL
  Filled 2017-07-06: qty 2

## 2017-07-06 MED ORDER — DIPHENHYDRAMINE HCL 50 MG/ML IJ SOLN
25.0000 mg | Freq: Once | INTRAMUSCULAR | Status: AC
  Administered 2017-07-06: 25 mg via INTRAVENOUS
  Filled 2017-07-06: qty 1

## 2017-07-06 MED ORDER — FERROUS SULFATE 325 (65 FE) MG PO TABS: 325.0000 mg | ORAL_TABLET | Freq: Two times a day (BID) | ORAL | 0 refills | Status: DC

## 2017-07-06 MED ORDER — SODIUM CHLORIDE 0.9 % IV SOLN
INTRAVENOUS | Status: DC
  Administered 2017-07-06: 06:00:00 via INTRAVENOUS

## 2017-07-06 MED ORDER — ONDANSETRON 8 MG PO TBDP
8.0000 mg | ORAL_TABLET | Freq: Once | ORAL | Status: AC
  Administered 2017-07-06: 8 mg via ORAL
  Filled 2017-07-06: qty 1

## 2017-07-06 NOTE — MAU Provider Note (Signed)
History     CSN: [id number]  Arrival date and time: 07/06/17 2952  First Provider Initiated Contact with Patient 07/06/17 0319      Chief Complaint  Patient presents with  . Headache   HPI [name] [name] is a 26 y.o. [phone] at [redacted]w[redacted]d who presents with headache. States around midnight tonight was trying to fall asleep when she started to feel "strange". Felt shaky. Since then has headache & tingling in extremities. Headache is frontal & occipital. Describes as throbbing. Rates pain 8/10. Took 1 ES tylenol at 1 am without relief. Standing makes pain worse; nothing makes pain better. Hx of migraines, feels like previous headaches. Hx of gestational hypertension in previous pregnancies; taking baby aspirin with current pregnancy. Was told anemic; has f/u appt with hem onc. Not taking iron supplement. Denies CP, SOB, visual disturbance, or epigastric pain.   OB History    Gravida Para Term Preterm AB Living   0 0 3   SAB TAB Ectopic Multiple Live Births   0 0 0 0 3      Past Medical History:  Diagnosis Date  . Anemia   . Anxiety   . Chlamydia 10-2011  . Cholecystitis   . Headache(784.0)   . Heart palpitations   . Hypertension     Past Surgical History:  Procedure Laterality Date  . ORIF ANKLE FRACTURE Left 02/12/2016   Procedure: OPEN REDUCTION INTERNAL FIXATION (ORIF) ANKLE FRACTURE;  Surgeon: Myrene Galas, MD;  Location: [facility] OR;  Service: Orthopedics;  Laterality: Left;    Family History  Problem Relation Age of Onset  . Diabetes Maternal Grandmother   . Hypertension Maternal Grandmother   . Cancer Maternal Grandmother   . Hypertension Mother   . Anesthesia problems Neg Hx   . Other Neg Hx     Social History  Substance Use Topics  . Smoking status: Current Every Day Smoker    Packs/day: 0.25    Years: 3.00    Types: Cigarettes  . Smokeless tobacco: Never Used  . Alcohol use No     Comment: socially    Allergies:  Allergies  Allergen Reactions  .  Metronidazole Nausea And Vomiting    Prescriptions Prior to Admission  Medication Sig Dispense Refill Last Dose  . amoxicillin-clavulanate (AUGMENTIN) 875-125 MG tablet Take 1 tablet by mouth 2 (two) times daily. 14 tablet 0 Taking  . bacitracin-neomycin-polymyxin-hydrocortisone (CORTISPORIN) 1 % ointment Apply 1 application topically 2 (two) times daily. 30 g 2 Taking  . Prenatal-DSS-FeCb-FeGl-FA (CITRANATAL BLOOM) 90-1 MG TABS Take 1 tablet by mouth daily. 30 tablet 12 Taking    Review of Systems  Constitutional: Negative.   Eyes: Negative for photophobia and visual disturbance.  Respiratory: Negative for shortness of breath.   Cardiovascular: Negative for chest pain, palpitations and leg swelling.  Gastrointestinal: Positive for nausea. Negative for abdominal pain and vomiting.  Genitourinary: Negative.   Neurological: Positive for headaches. Negative for dizziness.   Physical Exam   Blood pressure 132/73, pulse 92, temperature 98.2 F (36.8 C), temperature source Oral, resp. rate 17, height  (1.676 m), weight 200 lb (90.7 kg), last menstrual period 11/29/2016, SpO2 99 %, unknown if currently breastfeeding. Patient Vitals for the past 24 hrs:  BP Temp Temp src Pulse Resp SpO2 Height Weight  07/06/17 0430 132/73 - - 92 - - - -  07/06/17 0415 (!) 113/47 - - 87 - - - -  07/06/17 0400 122/63 - - 88 - - - -  07/06/17 0345 119/64 - - 84 - - - -  07/06/17 0330 (!) 117/56 - - 87 - - - -  07/06/17 0315 (!) 144/73 98.2 F (36.8 C) Oral 94 17 99 % - -  07/06/17 0303 - - - - - -  (1.676 m) 200 lb (90.7 kg)    Physical Exam  Nursing note and vitals reviewed. Constitutional: She is oriented to person, place, and time. She appears well-developed and well-nourished. No distress.  HENT:  Head: Normocephalic and atraumatic.  Eyes: Conjunctivae are normal. Right eye exhibits no discharge. Left eye exhibits no discharge. No scleral icterus.  Neck: Normal range of motion.   Cardiovascular: Normal rate, regular rhythm and normal heart sounds.   No murmur heard. Respiratory: Effort normal and breath sounds normal. No respiratory distress. She has no wheezes.  GI: Soft. Bowel sounds are normal. There is no tenderness.  Neurological: She is alert and oriented to person, place, and time. She has normal reflexes. No cranial nerve deficit.  No clonus  Skin: Skin is warm and dry. She is not diaphoretic.  Psychiatric: She has a normal mood and affect. Her behavior is normal. Judgment and thought content normal.   Fetal Tracing:  Baseline: 130 Variability: moderate Accelerations: 15x15 Decelerations: none  Toco: none MAU Course  Procedures Results for orders placed or performed during the hospital encounter of 07/06/17 (from the past 24 hour(s))  Urinalysis, Routine w reflex microscopic     Status: Abnormal   Collection Time: 07/06/17  2:59 AM  Result Value Ref Range   Color, Urine STRAW (A) YELLOW   APPearance CLEAR CLEAR   Specific Gravity, Urine 1.010 1.005 - 1.030   pH 7.0 5.0 - 8.0   Glucose, UA NEGATIVE NEGATIVE mg/dL   Hgb urine dipstick NEGATIVE NEGATIVE   Bilirubin Urine NEGATIVE NEGATIVE   Ketones, ur NEGATIVE NEGATIVE mg/dL   Protein, ur NEGATIVE NEGATIVE mg/dL   Nitrite NEGATIVE NEGATIVE   Leukocytes, UA NEGATIVE NEGATIVE  Protein / creatinine ratio, urine     Status: None   Collection Time: 07/06/17  2:59 AM  Result Value Ref Range   Creatinine, Urine 63.00 mg/dL   Total Protein, Urine 9 mg/dL   Protein Creatinine Ratio 0.14 0.00 - 0.15 mg/mg[Cre]  CBC     Status: Abnormal   Collection Time: 07/06/17  4:24 AM  Result Value Ref Range   WBC 11.0 (H) 4.0 - 10.5 K/uL   RBC 3.63 (L) 3.87 - 5.11 MIL/uL   Hemoglobin 8.2 (L) 12.0 - 15.0 g/dL   HCT 16.1 (L) 09.6 - 04.5 %   MCV 69.1 (L) 78.0 - 100.0 fL   MCH 22.6 (L) 26.0 - 34.0 pg   MCHC 32.7 30.0 - 36.0 g/dL   RDW 40.9 81.1 - 91.4 %   Platelets 295 150 - 400 K/uL  Comprehensive metabolic  panel     Status: Abnormal   Collection Time: 07/06/17  4:24 AM  Result Value Ref Range   Sodium 136 135 - 145 mmol/L   Potassium 3.4 (L) 3.5 - 5.1 mmol/L   Chloride 106 101 - 111 mmol/L   CO2 22 22 - 32 mmol/L   Glucose, Bld 83 65 - 99 mg/dL   BUN 5 (L) 6 - 20 mg/dL   Creatinine, Ser 7.82 (L) 0.44 - 1.00 mg/dL   Calcium 7.9 (L) 8.9 - 10.3 mg/dL   Total Protein 6.5 6.5 - 8.1 g/dL   Albumin 2.3 (L) 3.5 - 5.0 g/dL  AST 11 (L) 15 - 41 U/L   ALT 9 (L) 14 - 54 U/L   Alkaline Phosphatase 65 38 - 126 U/L   Total Bilirubin 0.2 (L) 0.3 - 1.2 mg/dL   GFR calc non Af Amer >60 >60 mL/min   GFR calc Af Amer >60 >60 mL/min   Anion gap 8 5 - 15    MDM Reactive NST Elevated BP initially; will cycle BPs CBC, CMP, urine PCR zofran & fioricet given -- pt reports minimal improvement in headache (8>6). Requesting headache cocktail. H/a cocktail ordered (decadron, benadryl, reglan).  Moderate improvement in h/a w/iv meds Assessment and Plan  A:  1. Anemia affecting pregnancy in third trimester   2. Pregnancy headache in third trimester   3. Gestational hypertension, third trimester   4. [redacted] weeks gestation of pregnancy    P: Discharge home Rx ferrous sulfate F/u as scheduled  Discussed reasons to return to MAU  Judeth Horn 07/06/2017, 3:18 AM

## 2017-07-06 NOTE — MAU Note (Signed)
Pt reports that around midnight she was trying to go to bed and "something didn't feel right." States leg felt weak, she started having tingling sensation all over body. "Something doesn't feel right." Pt reports mid back pain that she rates 8/10 and a HA that she rates 9/10. Reports HA-took tylenol around 1am-did not help. Pt reports she is on aspirin for hx of GHTN. Pt denies vision changes, RUQ pain. Pt denies LOF or vaginal bleeding. Reports good fetal movement.

## 2017-07-06 NOTE — Discharge Instructions (Signed)
Anemia, Nonspecific Anemia is a condition in which the concentration of red blood cells or hemoglobin in the blood is below normal. Hemoglobin is a substance in red blood cells that carries oxygen to the tissues of the body. Anemia results in not enough oxygen reaching these tissues. What are the causes? Common causes of anemia include:  Excessive bleeding. Bleeding may be internal or external. This includes excessive bleeding from periods (in women) or from the intestine.  Poor nutrition.  Chronic kidney, thyroid, and liver disease.  Bone marrow disorders that decrease red blood cell production.  Cancer and treatments for cancer.  HIV, AIDS, and their treatments.  Spleen problems that increase red blood cell destruction.  Blood disorders.  Excess destruction of red blood cells due to infection, medicines, and autoimmune disorders. What are the signs or symptoms?  Minor weakness.  Dizziness.  Headache.  Palpitations.  Shortness of breath, especially with exercise.  Paleness.  Cold sensitivity.  Indigestion.  Nausea.  Difficulty sleeping.  Difficulty concentrating. Symptoms may occur suddenly or they may develop slowly. How is this diagnosed? Additional blood tests are often needed. These help your health care provider determine the best treatment. Your health care provider will check your stool for blood and look for other causes of blood loss. How is this treated? Treatment varies depending on the cause of the anemia. Treatment can include:  Supplements of iron, vitamin B12, or folic acid.  Hormone medicines.  A blood transfusion. This may be needed if blood loss is severe.  Hospitalization. This may be needed if there is significant continual blood loss.  Dietary changes.  Spleen removal. Follow these instructions at home: Keep all follow-up appointments. It often takes many weeks to correct anemia, and having your health care provider check on your  condition and your response to treatment is very important. Get help right away if:  You develop extreme weakness, shortness of breath, or chest pain.  You become dizzy or have trouble concentrating.  You develop heavy vaginal bleeding.  You develop a rash.  You have bloody or black, tarry stools.  You faint.  You vomit up blood.  You vomit repeatedly.  You have abdominal pain.  You have a fever or persistent symptoms for more than 2-3 days.  You have a fever and your symptoms suddenly get worse.  You are dehydrated. This information is not intended to replace advice given to you by your health care provider. Make sure you discuss any questions you have with your health care provider. Document Released: 10/27/2004 Document Revised: 03/02/2016 Document Reviewed: 03/15/2013 Elsevier Interactive Patient Education  2017 [employer]  

## 2017-07-06 NOTE — MAU Note (Signed)
PT SAYS H/A  STARTED   AT [facility] - RATE 9.     BACK AND   BOTH FEET FEEL SAME  AS BEFORE -   FEELS TINGLING  THROUGH BODY .    SAYS SHE HAD SHAKING   AT  HOME .     [facility]-  [facility]-  WATCHING  BP  ,  SAYS HEMOGLOBIN   IS  LOW - HAS  AN APPOINTMENT ON 10-17  TO BE SEEN AT [facility].

## 2017-07-13 ENCOUNTER — Encounter: Payer: Medicaid Other | Admitting: Certified Nurse Midwife

## 2017-07-14 ENCOUNTER — Ambulatory Visit (INDEPENDENT_AMBULATORY_CARE_PROVIDER_SITE_OTHER): Payer: Medicaid Other | Admitting: Certified Nurse Midwife

## 2017-07-14 VITALS — BP 144/77 | HR 96 | Wt 199.0 lb

## 2017-07-14 DIAGNOSIS — Z3483 Encounter for supervision of other normal pregnancy, third trimester: Secondary | ICD-10-CM

## 2017-07-14 DIAGNOSIS — Z8759 Personal history of other complications of pregnancy, childbirth and the puerperium: Secondary | ICD-10-CM

## 2017-07-14 DIAGNOSIS — O2343 Unspecified infection of urinary tract in pregnancy, third trimester: Secondary | ICD-10-CM

## 2017-07-14 DIAGNOSIS — O163 Unspecified maternal hypertension, third trimester: Secondary | ICD-10-CM

## 2017-07-14 DIAGNOSIS — Z3480 Encounter for supervision of other normal pregnancy, unspecified trimester: Secondary | ICD-10-CM

## 2017-07-14 DIAGNOSIS — R51 Headache: Secondary | ICD-10-CM

## 2017-07-14 DIAGNOSIS — O26893 Other specified pregnancy related conditions, third trimester: Secondary | ICD-10-CM

## 2017-07-14 DIAGNOSIS — B951 Streptococcus, group B, as the cause of diseases classified elsewhere: Secondary | ICD-10-CM

## 2017-07-14 MED ORDER — BUTALBITAL-APAP-CAFFEINE 50-325-40 MG PO TABS: 1.0000 | ORAL_TABLET | Freq: Four times a day (QID) | ORAL | 4 refills | Status: DC | PRN

## 2017-07-14 NOTE — Progress Notes (Signed)
Pt was seen at [facility] on 10/4, see notes. Pt is having some LE swelling and back pain.  Pt states she is having HA's.

## 2017-07-14 NOTE — Progress Notes (Addendum)
PRENATAL VISIT NOTE  Subjective:  [name] [name] is a 26 y.o. [phone] at 31w3dbeing seen today for ongoing prenatal care.  She is currently monitored for the following issues for this low-risk pregnancy and has Murmur, cardiac; Encounter for supervision of normal pregnancy, unspecified, unspecified trimester; History of gestational hypertension; Short interval between pregnancies affecting pregnancy, antepartum; and Group B streptococcus urinary tract infection affecting pregnancy on her problem list.  Patient reports headache, nausea, no bleeding, no contractions, no cramping, no leaking and vomiting.  Contractions: Irregular. Vag. Bleeding: None.  Movement: Present. Denies leaking of fluid.   The following portions of the patient's history were reviewed and updated as appropriate: allergies, current medications, past family history, past medical history, past social history, past surgical history and problem list. Problem list updated.  Objective:   Vitals:   07/14/17 1125  BP: (!) 144/77  Pulse: 96  Weight: 199 lb (90.3 kg)    Fetal Status: Fetal Heart Rate (bpm): 130; doppler Fundal Height: 32 cm Movement: Present     General:  Alert, oriented and cooperative. Patient is in no acute distress.  Skin: Skin is warm and dry. No rash noted.   Cardiovascular: Normal heart rate noted  Respiratory: Normal respiratory effort, no problems with respiration noted  Abdomen: Soft, gravid, appropriate for gestational age.  Pain/Pressure: Present     Pelvic: Cervical exam deferred        Extremities: Normal range of motion.     Mental Status:  Normal mood and affect. Normal behavior. Normal judgment and thought content.   Assessment and Plan:  Pregnancy: G4P3003 at 372w3d1. Supervision of other normal pregnancy, antepartum      Slightly elevated blood pressure today.  Discussed increased water intake and no salt.   2. History of gestational hypertension     Is taking baby ASA  3. Group B  Streptococcus urinary tract infection affecting pregnancy in third trimester     PCN for labor/delivery  4. Elevated blood pressure affecting pregnancy in third trimester, antepartum      Baseline labs to repeat from MAU visit. BPP ordered for next week.  - Comp Met (CMET) - CBC - Protein / creatinine ratio, urine  5. Pregnancy headache in third trimester      - butalbital-acetaminophen-caffeine (FIORICET, ESGIC) 50-325-40 MG tablet; Take 1-2 tablets by mouth every 6 (six) hours as needed.  Dispense: 45 tablet; Refill: 2  Preterm labor symptoms and general obstetric precautions including but not limited to vaginal bleeding, contractions, leaking of fluid and fetal movement were reviewed in detail with the patient. Please refer to After Visit Summary for other counseling recommendations.  Return in about 1 week (around 07/21/2017) for nurse visit b/p check early next week, Bi-weekly Antenatal testing, HOB.   RaMorene CrockerCNM

## 2017-07-15 IMAGING — US US ABDOMEN LIMITED
1 series · 14 of 25 positions shown · non-contrast
Comparison: 07/02/2015

CLINICAL DATA: Right upper quadrant abdominal pain, nausea

EXAM:
US ABDOMEN LIMITED - RIGHT UPPER QUADRANT

[Series 1: us abdomen limited · 0.22mm/px · 14 of 35 slices shown]
[im 1/35]
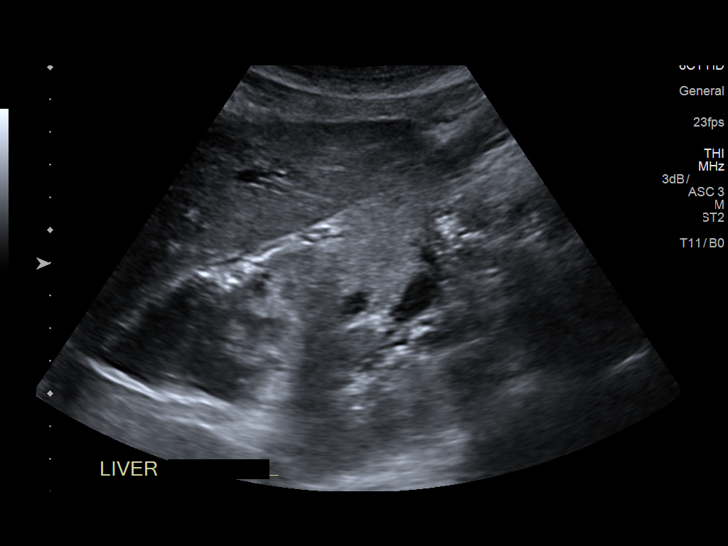
[im 3/35]
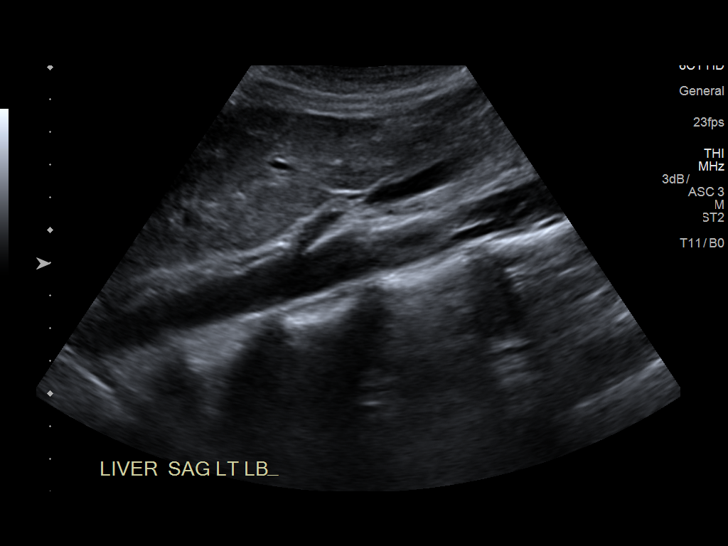
[im 6/35]
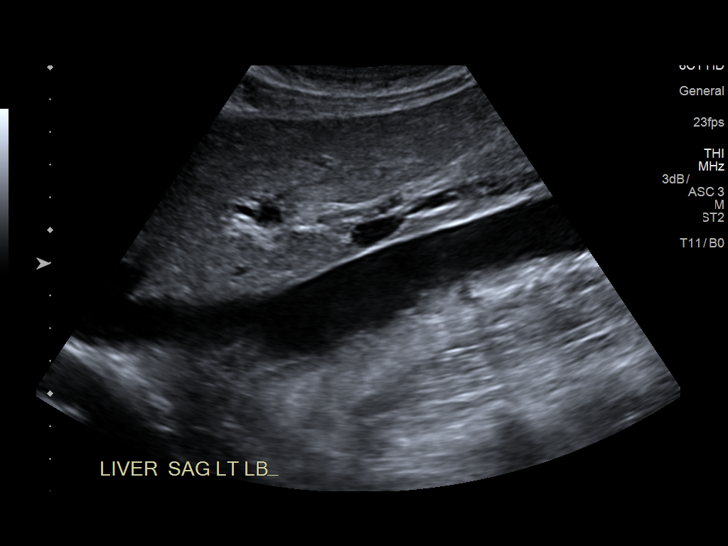
[im 9/35]
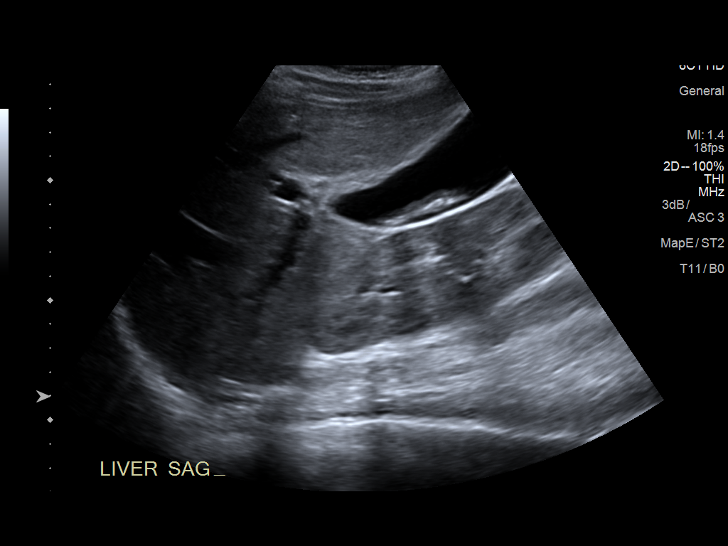
[im 12/35]
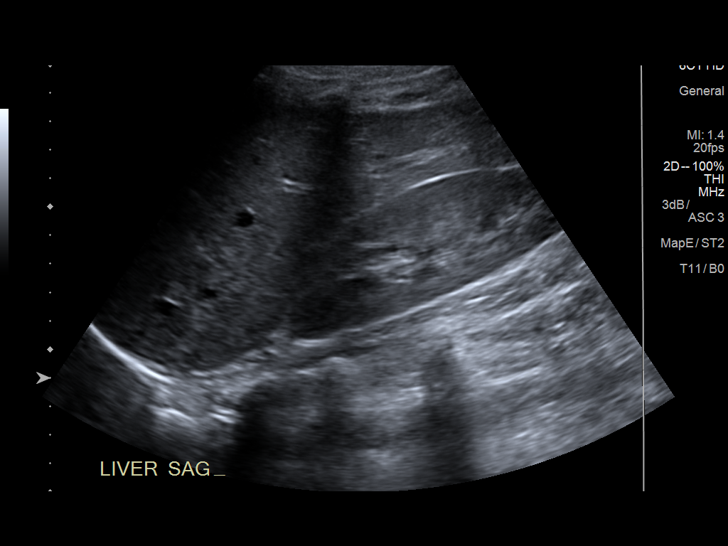
[im 13/35]
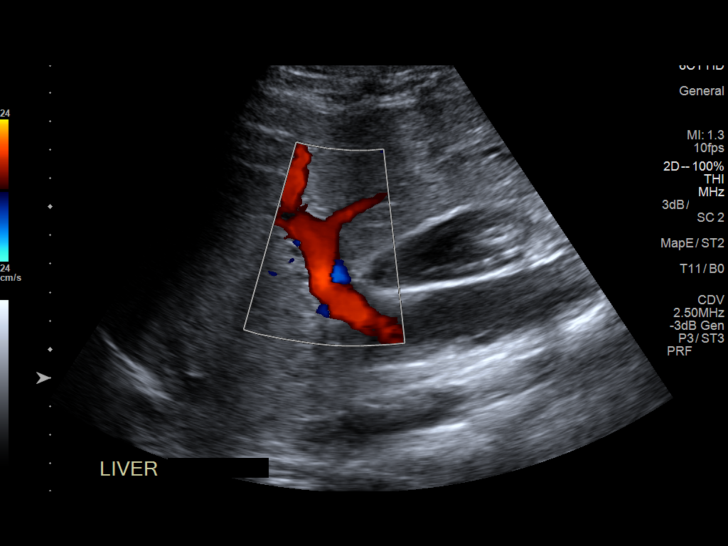
[im 16/35]
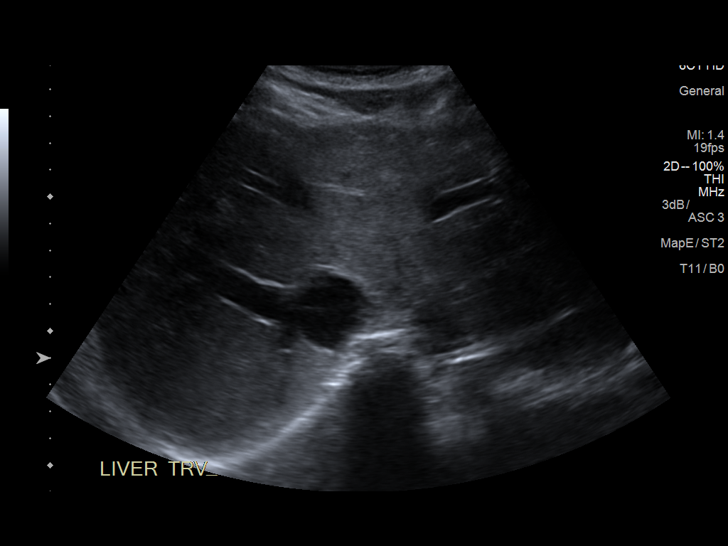
[im 19/35]
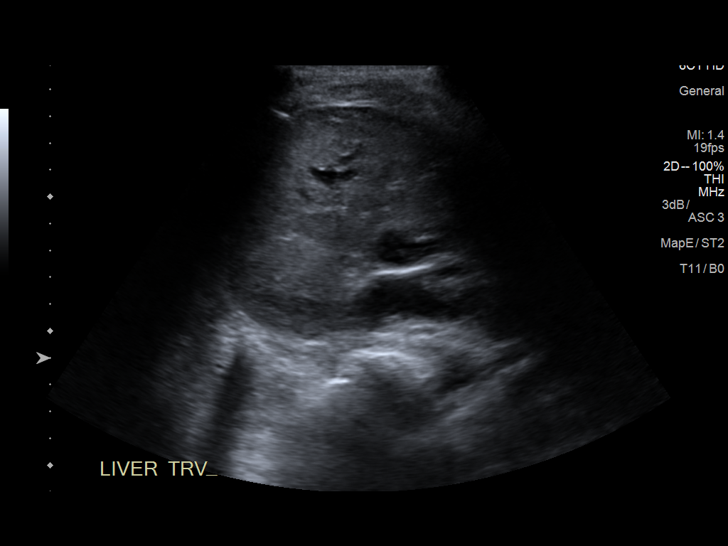
[im 22/35]
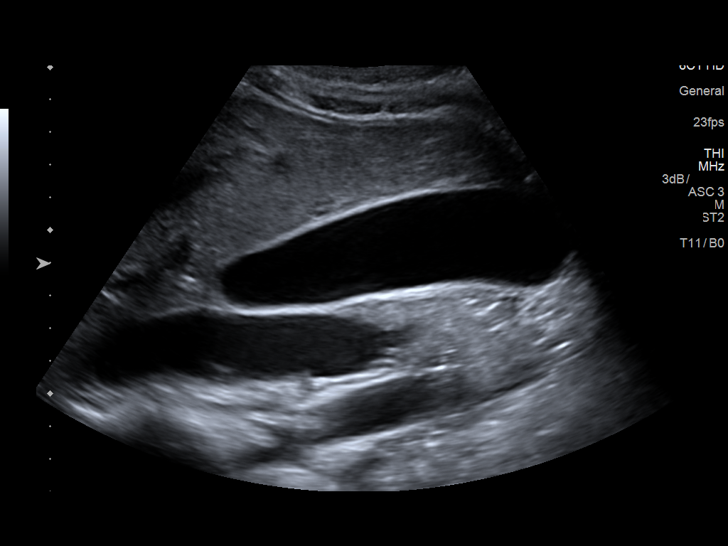
[im 23/35]
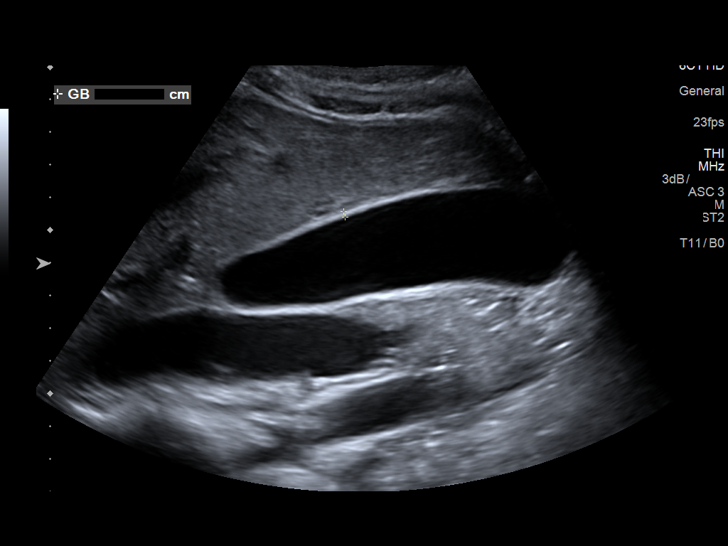
[im 26/35]
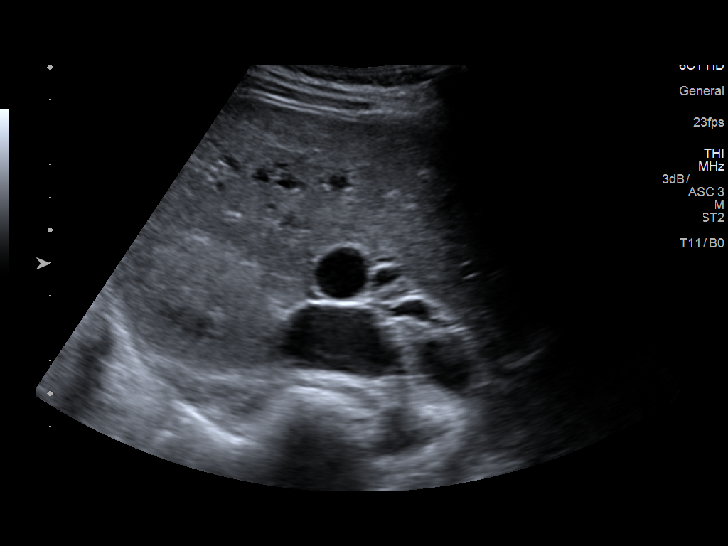
[im 29/35]
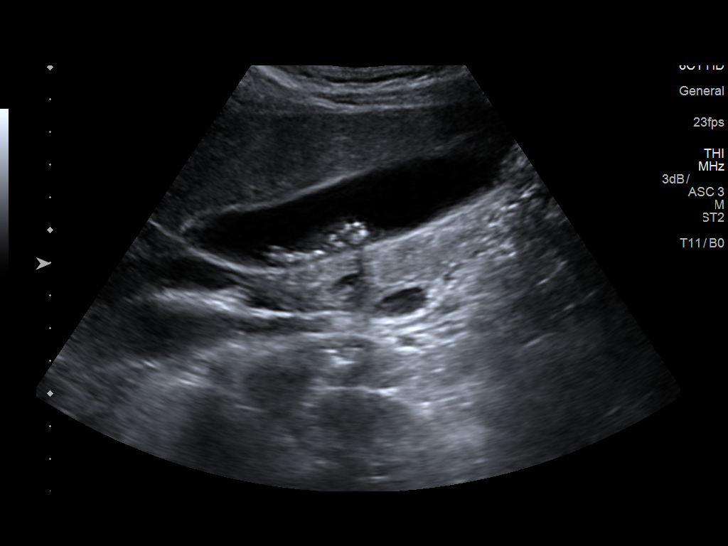
[im 32/35]
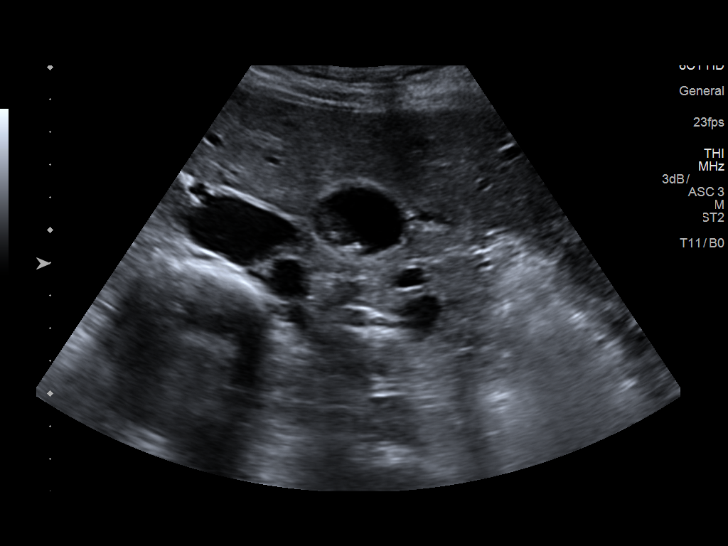
[im 35/35]
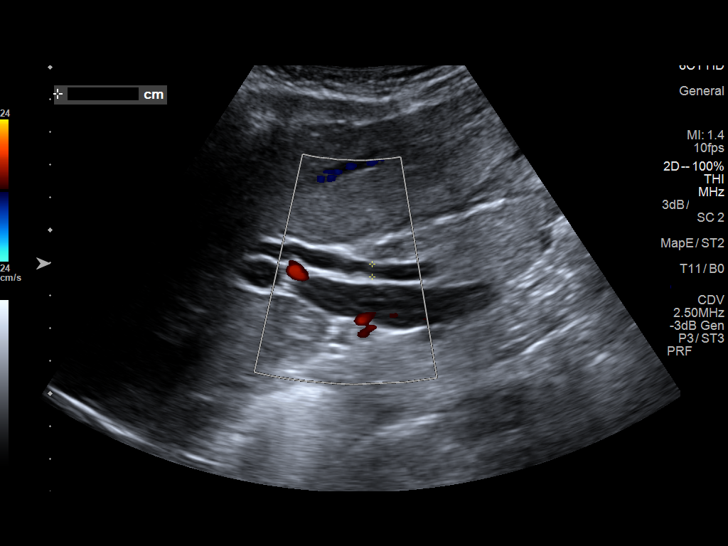

[14 of 25 positions shown; findings below may reference images not displayed]

FINDINGS: Gallbladder:

Layering gallstones with gallbladder sludge. No gallbladder wall
thickening or pericholecystic fluid. Negative sonographic Murphy's
sign.

Common bile duct:

Diameter: 4 mm

Liver:

No focal lesion identified. Within normal limits in parenchymal
echogenicity.
IMPRESSION: Cholelithiasis, without associated sonographic findings to suggest
acute cholecystitis.

## 2017-07-16 LAB — PROTEIN / CREATININE RATIO, URINE
Creatinine, Urine: 165.2 mg/dL
PROTEIN UR: 51 mg/dL
PROTEIN/CREAT RATIO: 309 mg/g{creat} — AB (ref 0–200)

## 2017-07-16 LAB — COMPREHENSIVE METABOLIC PANEL
A/G RATIO: 1.2 (ref 1.2–2.2)
ALBUMIN: 3.4 g/dL — AB (ref 3.5–5.5)
ALK PHOS: 88 IU/L (ref 39–117)
ALT: 6 IU/L (ref 0–32)
AST: 11 IU/L (ref 0–40)
BUN / CREAT RATIO: 8 — AB (ref 9–23)
BUN: 3 mg/dL — ABNORMAL LOW (ref 6–20)
CHLORIDE: 101 mmol/L (ref 96–106)
CO2: 20 mmol/L (ref 20–29)
Calcium: 8.5 mg/dL — ABNORMAL LOW (ref 8.7–10.2)
Creatinine, Ser: 0.4 mg/dL — ABNORMAL LOW (ref 0.57–1.00)
GFR calc Af Amer: 169 mL/min/{1.73_m2} (ref >59)
GFR calc non Af Amer: 146 mL/min/{1.73_m2} (ref >59)
GLOBULIN, TOTAL: 2.8 g/dL (ref 1.5–4.5)
GLUCOSE: 83 mg/dL (ref 65–99)
POTASSIUM: 3.3 mmol/L — AB (ref 3.5–5.2)
SODIUM: 136 mmol/L (ref 134–144)
Total Protein: 6.2 g/dL (ref 6.0–8.5)

## 2017-07-16 LAB — CBC
Hematocrit: 28.7 % — ABNORMAL LOW (ref 34.0–46.6)
Hemoglobin: 8.8 g/dL — ABNORMAL LOW (ref 11.1–15.9)
MCH: 21.6 pg — ABNORMAL LOW (ref 26.6–33.0)
MCHC: 30.7 g/dL — ABNORMAL LOW (ref 31.5–35.7)
MCV: 71 fL — ABNORMAL LOW (ref 79–97)
Platelets: 367 10*3/uL (ref 150–379)
RBC: 4.07 x10E6/uL (ref 3.77–5.28)
RDW: 16.6 % — ABNORMAL HIGH (ref 12.3–15.4)
WBC: 10.9 10*3/uL — ABNORMAL HIGH (ref 3.4–10.8)

## 2017-07-17 ENCOUNTER — Other Ambulatory Visit: Payer: Self-pay | Admitting: Certified Nurse Midwife

## 2017-07-17 DIAGNOSIS — O1493 Unspecified pre-eclampsia, third trimester: Secondary | ICD-10-CM

## 2017-07-19 ENCOUNTER — Encounter: Payer: Medicaid Other | Admitting: Hematology

## 2017-07-19 ENCOUNTER — Telehealth: Payer: Self-pay | Admitting: Hematology

## 2017-07-19 NOTE — Telephone Encounter (Signed)
Patient missed appointment and needed to reschedule

## 2017-07-21 ENCOUNTER — Encounter (HOSPITAL_COMMUNITY): Payer: Self-pay

## 2017-07-21 ENCOUNTER — Other Ambulatory Visit (HOSPITAL_COMMUNITY): Payer: Self-pay | Admitting: *Deleted

## 2017-07-21 ENCOUNTER — Ambulatory Visit (HOSPITAL_COMMUNITY)
Admission: RE | Admit: 2017-07-21 | Discharge: 2017-07-21 | Disposition: A | Discharge status: Home / Self Care | Payer: Medicaid Other | Source: Ambulatory Visit | Attending: Certified Nurse Midwife | Admitting: Certified Nurse Midwife

## 2017-07-21 ENCOUNTER — Other Ambulatory Visit: Payer: Self-pay | Admitting: Certified Nurse Midwife

## 2017-07-21 DIAGNOSIS — Z3A33 33 weeks gestation of pregnancy: Secondary | ICD-10-CM

## 2017-07-21 DIAGNOSIS — O1493 Unspecified pre-eclampsia, third trimester: Secondary | ICD-10-CM | POA: Diagnosis not present

## 2017-07-21 DIAGNOSIS — O365930 Maternal care for other known or suspected poor fetal growth, third trimester, not applicable or unspecified: Secondary | ICD-10-CM | POA: Insufficient documentation

## 2017-07-21 DIAGNOSIS — O163 Unspecified maternal hypertension, third trimester: Secondary | ICD-10-CM | POA: Insufficient documentation

## 2017-07-24 ENCOUNTER — Telehealth: Payer: Self-pay | Admitting: Hematology

## 2017-07-24 ENCOUNTER — Ambulatory Visit (HOSPITAL_BASED_OUTPATIENT_CLINIC_OR_DEPARTMENT_OTHER): Payer: Medicaid Other | Admitting: Hematology

## 2017-07-24 ENCOUNTER — Ambulatory Visit (HOSPITAL_BASED_OUTPATIENT_CLINIC_OR_DEPARTMENT_OTHER): Payer: Medicaid Other

## 2017-07-24 ENCOUNTER — Other Ambulatory Visit: Payer: Self-pay

## 2017-07-24 ENCOUNTER — Encounter: Payer: Self-pay | Admitting: Hematology

## 2017-07-24 VITALS — BP 132/70 | HR 103 | Temp 98.2°F | Resp 18 | Ht 66.0 in | Wt 200.0 lb

## 2017-07-24 DIAGNOSIS — D509 Iron deficiency anemia, unspecified: Secondary | ICD-10-CM

## 2017-07-24 DIAGNOSIS — O99013 Anemia complicating pregnancy, third trimester: Secondary | ICD-10-CM

## 2017-07-24 LAB — CBC & DIFF AND RETIC
BASO%: 0.2 % (ref 0.0–2.0)
Basophils Absolute: 0 10*3/uL (ref 0.0–0.1)
EOS ABS: 0.1 10*3/uL (ref 0.0–0.5)
EOS%: 1.2 % (ref 0.0–7.0)
HEMATOCRIT: 30 % — AB (ref 34.8–46.6)
HEMOGLOBIN: 9.5 g/dL — AB (ref 11.6–15.9)
IMMATURE RETIC FRACT: 17.1 % — AB (ref 1.60–10.00)
LYMPH%: 20.1 % (ref 14.0–49.7)
MCH: 22.2 pg — ABNORMAL LOW (ref 25.1–34.0)
MCHC: 31.7 g/dL (ref 31.5–36.0)
MCV: 70.3 fL — AB (ref 79.5–101.0)
MONO#: 0.9 10*3/uL (ref 0.1–0.9)
MONO%: 9 % (ref 0.0–14.0)
NEUT%: 69.5 % (ref 38.4–76.8)
NEUTROS ABS: 6.8 10*3/uL — AB (ref 1.5–6.5)
Platelets: 289 10*3/uL (ref 145–400)
RBC: 4.27 10*6/uL (ref 3.70–5.45)
RDW: 15.1 % — ABNORMAL HIGH (ref 11.2–14.5)
Retic %: 1.41 % (ref 0.70–2.10)
Retic Ct Abs: 60.21 10*3/uL (ref 33.70–90.70)
WBC: 9.8 10*3/uL (ref 3.9–10.3)
lymph#: 2 10*3/uL (ref 0.9–3.3)

## 2017-07-24 LAB — COMPREHENSIVE METABOLIC PANEL
ALBUMIN: 2.5 g/dL — AB (ref 3.5–5.0)
ALK PHOS: 98 U/L (ref 40–150)
ALT: <6 U/L (ref 0–55)
AST: 9 U/L (ref 5–34)
Anion Gap: 10 mEq/L (ref 3–11)
BUN: 2.9 mg/dL — AB (ref 7.0–26.0)
CALCIUM: 8.6 mg/dL (ref 8.4–10.4)
CO2: 21 mEq/L — ABNORMAL LOW (ref 22–29)
Chloride: 106 mEq/L (ref 98–109)
Creatinine: 0.6 mg/dL (ref 0.6–1.1)
GLUCOSE: 67 mg/dL — AB (ref 70–140)
Potassium: 3.3 mEq/L — ABNORMAL LOW (ref 3.5–5.1)
SODIUM: 137 meq/L (ref 136–145)
TOTAL PROTEIN: 7.1 g/dL (ref 6.4–8.3)

## 2017-07-24 LAB — FERRITIN

## 2017-07-24 LAB — IRON AND TIBC
%SAT: 5 % — ABNORMAL LOW (ref 21–57)
Iron: 26 ug/dL — ABNORMAL LOW (ref 41–142)
TIBC: 549 ug/dL — ABNORMAL HIGH (ref 236–444)
UIBC: 523 ug/dL — ABNORMAL HIGH (ref 120–384)

## 2017-07-24 MED ORDER — POLYSACCHARIDE IRON COMPLEX 150 MG PO CAPS: 150.0000 mg | ORAL_CAPSULE | Freq: Two times a day (BID) | ORAL | 3 refills | Status: DC

## 2017-07-24 NOTE — Telephone Encounter (Signed)
Gave avs and calendar for November  °

## 2017-07-24 NOTE — Progress Notes (Signed)
HEMATOLOGY/ONCOLOGY CONSULTATION NOTE  Date of Service: 07/24/2017  Patient Care Team: Patient, No Pcp Per as PCP - General (General Practice)  CHIEF COMPLAINTS/PURPOSE OF CONSULTATION:  Iron deficiency Anemia in Pregnancy  HISTORY OF PRESENTING ILLNESS:   [name] [name] is a wonderful 26 y.o. female who has been referred to [place] by Valentina Lucks CNM for evaluation and management of anemia affecting pregnancy in third trimester. [id number] with her Estimated Date of Delivery: 09/05/17.   As of 07/14/2017 her hgb is 8.8, her platelets are stable at 367. She has a history of gestational hypertension during pregnancy and her blood pressure was elevated for a few months after delivery and is not currently on any medications for this. She reports that she is doing well overall. She states that she has anemia during 3 pregnancies. She has a 24 year old, 24 year old, and 24 year old. She had mild anemia 6 months before her first pregnancy likely due to heavy menstrual cycles. She states that she has never had IV iron and was recommended for a blood transfusion 3 years ago to which she declined. She intermittently takes ferrous sulfate. She was started 2-3 weeks ago and takes 2x daily. She reports that she is taking 75% of the dose due to her constipation. Her hgb today has improved to 9.5 . No C-section deliveries. She states that she has a family history of anemia, her grandmother had metastatic melanoma and breast cancer. She is currently a smoker, she states before pregnancy she was smoking 7-8 cigarettes per day whereas now she is smoking 1 per day. She reports pica symptoms such as craving starch and ice related to Iron deficiency.  On review of systems, pt reports mild fatigue, nausea, HA, constipation and denies bloody stools, vaginal bleeding during pregnancy and any other accompanying symptoms.   MEDICAL HISTORY:  Past Medical History:  Diagnosis Date  . Anemia   . Anxiety   . Chlamydia 10-2011   . Cholecystitis   . Headache(784.0)   . Heart palpitations   . Hypertension     SURGICAL HISTORY: Past Surgical History:  Procedure Laterality Date  . ORIF ANKLE FRACTURE Left 02/12/2016   Procedure: OPEN REDUCTION INTERNAL FIXATION (ORIF) ANKLE FRACTURE;  Surgeon: Myrene Galas, MD;  Location: [facility] OR;  Service: Orthopedics;  Laterality: Left;    SOCIAL HISTORY: Social History   Social History  . Marital status: Single    Spouse name: N/A  . Number of children: N/A  . Years of education: N/A   Occupational History  . Not on file.   Social History Main Topics  . Smoking status: Current Every Day Smoker    Packs/day: 0.25    Years: 3.00    Types: Cigarettes  . Smokeless tobacco: Never Used  . Alcohol use No     Comment: socially  . Drug use: No  . Sexual activity: Yes    Birth control/ protection: None   Other Topics Concern  . Not on file   Social History Narrative  . No narrative on file    FAMILY HISTORY: Family History  Problem Relation Age of Onset  . Diabetes Maternal Grandmother   . Hypertension Maternal Grandmother   . Cancer Maternal Grandmother   . Hypertension Mother   . Anesthesia problems Neg Hx   . Other Neg Hx     ALLERGIES:  is allergic to metronidazole.  MEDICATIONS:  Current Outpatient Prescriptions  Medication Sig Dispense Refill  . amoxicillin-clavulanate (AUGMENTIN) 875-125  MG tablet Take 1 tablet by mouth 2 (two) times daily. (Patient not taking: Reported on 07/21/2017) 14 tablet 0  . aspirin 81 MG chewable tablet Chew by mouth daily.    . bacitracin-neomycin-polymyxin-hydrocortisone (CORTISPORIN) 1 % ointment Apply 1 application topically 2 (two) times daily. (Patient not taking: Reported on 07/21/2017) 30 g 2  . butalbital-acetaminophen-caffeine (FIORICET, ESGIC) 50-325-40 MG tablet Take 1-2 tablets by mouth every 6 (six) hours as needed. 45 tablet 4  . iron polysaccharides (NIFEREX) 150 MG capsule Take 1 capsule (150 mg total) by  mouth 2 (two) times daily. With orange juice 60 capsule 3  . Prenatal-DSS-FeCb-FeGl-FA (CITRANATAL BLOOM) 90-1 MG TABS Take 1 tablet by mouth daily. 30 tablet 12   No current facility-administered medications for this visit.     REVIEW OF SYSTEMS:    10 Point review of Systems was done is negative except as noted above.  PHYSICAL EXAMINATION: ECOG PERFORMANCE STATUS: 1 - Symptomatic but completely ambulatory  . Vitals:   07/24/17 1048  BP: 132/70  Pulse: (!) 103  Resp: 18  Temp: 98.2 F (36.8 C)  SpO2: 100%   Filed Weights   07/24/17 1048  Weight: 200 lb (90.7 kg)   .Body mass index is 32.28 kg/m.  GENERAL:alert, in no acute distress and comfortable SKIN: no acute rashes, no significant lesions EYES: conjunctiva are pink and non-injected, sclera anicteric OROPHARYNX: MMM, no exudates, no oropharyngeal erythema or ulceration NECK: supple, no JVD LYMPH:  no palpable lymphadenopathy in the cervical, axillary or inguinal regions LUNGS: clear to auscultation b/l with normal respiratory effort HEART: regular rate & rhythm ABDOMEN:  normoactive bowel sounds , non tender, gravid uterus Extremity: no pedal edema PSYCH: alert & oriented x 3 with fluent speech NEURO: no focal motor/sensory deficits  LABORATORY DATA:  I have reviewed the data as listed  . CBC Latest Ref Rng & Units 07/24/2017 07/14/2017 07/06/2017  WBC 3.9 - 10.3 10e3/uL 9.8 10.9(H) 11.0(H)  Hemoglobin 11.6 - 15.9 g/dL 1.6(X) 0.9(U) 0.4(V)  Hematocrit 34.8 - 46.6 % 30.0(L) 28.7(L) 25.1(L)  Platelets 145 - 400 10e3/uL 289 367 295    . CMP Latest Ref Rng & Units 07/24/2017 07/14/2017 07/06/2017  Glucose 70 - 140 mg/dl 40(J) 83 83  BUN 7.0 - 26.0 mg/dL 2.9(L) 3(L) 5(L)  Creatinine 0.6 - 1.1 mg/dL 0.6 8.11(B) 1.47(W)  Sodium 136 - 145 mEq/L 137 136 136  Potassium 3.5 - 5.1 mEq/L 3.3(L) 3.3(L) 3.4(L)  Chloride 96 - 106 mmol/L - 101 106  CO2 22 - 29 mEq/L 21(L) 20 22  Calcium 8.4 - 10.4 mg/dL 8.6 2.9(F)  7.9(L)  Total Protein 6.4 - 8.3 g/dL 7.1 6.2 6.5  Total Bilirubin 0.20 - 1.20 mg/dL <6.21 <3.0 8.6(V)  Alkaline Phos 40 - 150 U/L 98 88 65  AST 5 - 34 U/L 9 11 11(L)  ALT 0-55 U/L U/L <6 6 9(L)   . Lab Results  Component Value Date   IRON 26 (L) 07/24/2017   TIBC 549 (H) 07/24/2017   IRONPCTSAT 5 (L) 07/24/2017   (Iron and TIBC)  Lab Results  Component Value Date   FERRITIN <4 (L) 07/24/2017     RADIOGRAPHIC STUDIES: I have personally reviewed the radiological images as listed and agreed with the findings in the report.   ASSESSMENT & PLAN:   Terrisha Lopata is a wonderful 26 y.o. female who is presenting with anemia affecting pregnancy.  1) Anemia in pregnancy Patient has 2) severe Iron deficiency due to previous  menstrual loss and the significant Iron demands of multiple pregnancies. Hgb has improved some from 8.8 to 9.5 after 2 weeks of PO ferrous sulfate Mild constipation with po ferrous sulfate. Plan:  -Labs today done and results noted -switch to higher dose iron polysaccharide 150 mg PO BID with orange juice. -IV Iron can be considered by OB/GYN given evidence of profound ron def (ferritin <4) - cannot be done without materno-fetal monitoring in our infusion center -- would defer this option to GYn (if consider would recommend Iron sucrose). -continue Prenatal vitamins. -encourage the intake of iron rich foods. -can given IV iron more liberally after delivery if needed. -docusate for Iron associated constipation.  RTC with Dr Candise CheKale in 4 weeks with labs  All of the patients questions were answered with apparent satisfaction. The patient knows to call the clinic with any problems, questions or concerns.  I spent 35 minutes counseling the patient face to face. The total time spent in the appointment was 45 minutes and more than 50% was on counseling and direct patient cares.    Wyvonnia LoraGautam Johnnae Impastato MD MS AAHIVMS [facility] Hematology/Oncology Physician [facility]  (Office):       [phone] (Work cell):  [phone] (Fax):           [phone]  07/24/2017 11:34 AM    This document serves as a record of services personally performed by Wyvonnia LoraGautam Savas Elvin, MD. It was created on her behalf by Barney DrainLana Nabulsi, a trained medical scribe. The creation of this record is based on the scribe's personal observations and the provider's statements to them. This document has been checked and approved by the attending provider.

## 2017-07-25 ENCOUNTER — Ambulatory Visit (INDEPENDENT_AMBULATORY_CARE_PROVIDER_SITE_OTHER): Payer: Medicaid Other | Admitting: Obstetrics and Gynecology

## 2017-07-25 ENCOUNTER — Encounter: Payer: Medicaid Other | Admitting: Obstetrics and Gynecology

## 2017-07-25 VITALS — BP 146/81 | HR 106 | Wt 198.0 lb

## 2017-07-25 DIAGNOSIS — O09893 Supervision of other high risk pregnancies, third trimester: Secondary | ICD-10-CM

## 2017-07-25 DIAGNOSIS — Z8759 Personal history of other complications of pregnancy, childbirth and the puerperium: Secondary | ICD-10-CM

## 2017-07-25 DIAGNOSIS — O2343 Unspecified infection of urinary tract in pregnancy, third trimester: Secondary | ICD-10-CM

## 2017-07-25 DIAGNOSIS — Z3480 Encounter for supervision of other normal pregnancy, unspecified trimester: Secondary | ICD-10-CM

## 2017-07-25 DIAGNOSIS — O133 Gestational [pregnancy-induced] hypertension without significant proteinuria, third trimester: Secondary | ICD-10-CM | POA: Diagnosis not present

## 2017-07-25 DIAGNOSIS — B951 Streptococcus, group B, as the cause of diseases classified elsewhere: Secondary | ICD-10-CM

## 2017-07-25 DIAGNOSIS — O09899 Supervision of other high risk pregnancies, unspecified trimester: Secondary | ICD-10-CM

## 2017-07-25 NOTE — Progress Notes (Signed)
Pt states that she is having increase in pelvic pressure and upper back pain.

## 2017-07-25 NOTE — Progress Notes (Signed)
   PRENATAL VISIT NOTE  Subjective:  [name] [name] is a 26 y.o. [phone] at 7167w0d being seen today for ongoing prenatal care.  She is currently monitored for the following issues for this high-risk pregnancy and has Murmur, cardiac; Encounter for supervision of normal pregnancy, unspecified, unspecified trimester; History of gestational hypertension; Short interval between pregnancies affecting pregnancy, antepartum; and Group B streptococcus urinary tract infection affecting pregnancy on her problem list.  Patient reports no complaints.  Contractions: Irregular.  .  Movement: Present. Denies leaking of fluid.   The following portions of the patient's history were reviewed and updated as appropriate: allergies, current medications, past family history, past medical history, past social history, past surgical history and problem list. Problem list updated.  Objective:   Vitals:   07/25/17 1020  BP: (!) 146/81  Pulse: (!) 106  Weight: 198 lb (89.8 kg)    Fetal Status:     Movement: Present     General:  Alert, oriented and cooperative. Patient is in no acute distress.  Skin: Skin is warm and dry. No rash noted.   Cardiovascular: Normal heart rate noted  Respiratory: Normal respiratory effort, no problems with respiration noted  Abdomen: Soft, gravid, appropriate for gestational age.  Pain/Pressure: Present     Pelvic: Cervical exam deferred        Extremities: Normal range of motion.     Mental Status:  Normal mood and affect. Normal behavior. Normal judgment and thought content.   Assessment and Plan:  Pregnancy: G4P3003 at 1667w0d  1. History of gestational hypertension - Fetal nonstress test  2. Short interval between pregnancies affecting pregnancy, antepartum   3. Group B Streptococcus urinary tract infection affecting pregnancy in third trimester Will provide prophylaxis in labor  4. Supervision of other normal pregnancy, antepartum Patient is doing well without  complaints - Fetal nonstress test  5. Gestational hypertension, third trimester Patient with elevated BP today making diagnosis of gestational HTN. asymptomatic Continue ASA Plan for IOL at 37 weeks Follow up BPP on 10/26 NST reviewed and reactive with baseline 135, mod variability, + accels, no decels  Preterm labor symptoms and general obstetric precautions including but not limited to vaginal bleeding, contractions, leaking of fluid and fetal movement were reviewed in detail with the patient. Please refer to After Visit Summary for other counseling recommendations.  Return in about 1 week (around 08/01/2017) for ROB, NST.   Catalina AntiguaPeggy Toure Edmonds, MD

## 2017-07-28 ENCOUNTER — Other Ambulatory Visit (HOSPITAL_COMMUNITY): Payer: Medicaid Other

## 2017-07-28 ENCOUNTER — Encounter (HOSPITAL_COMMUNITY): Payer: Self-pay

## 2017-07-28 ENCOUNTER — Ambulatory Visit (HOSPITAL_COMMUNITY)
Admission: RE | Admit: 2017-07-28 | Discharge: 2017-07-28 | Disposition: A | Discharge status: Home / Self Care | Payer: Medicaid Other | Source: Ambulatory Visit | Attending: Certified Nurse Midwife | Admitting: Certified Nurse Midwife

## 2017-07-28 DIAGNOSIS — O365990 Maternal care for other known or suspected poor fetal growth, unspecified trimester, not applicable or unspecified: Secondary | ICD-10-CM | POA: Diagnosis present

## 2017-07-28 DIAGNOSIS — Z362 Encounter for other antenatal screening follow-up: Secondary | ICD-10-CM | POA: Diagnosis not present

## 2017-07-28 DIAGNOSIS — Z3A34 34 weeks gestation of pregnancy: Secondary | ICD-10-CM | POA: Insufficient documentation

## 2017-07-31 ENCOUNTER — Encounter: Payer: Medicaid Other | Admitting: Obstetrics and Gynecology

## 2017-07-31 ENCOUNTER — Ambulatory Visit (INDEPENDENT_AMBULATORY_CARE_PROVIDER_SITE_OTHER): Payer: Medicaid Other | Admitting: Obstetrics and Gynecology

## 2017-07-31 VITALS — BP 139/85 | HR 90 | Wt 201.0 lb

## 2017-07-31 DIAGNOSIS — O133 Gestational [pregnancy-induced] hypertension without significant proteinuria, third trimester: Secondary | ICD-10-CM

## 2017-07-31 DIAGNOSIS — O1490 Unspecified pre-eclampsia, unspecified trimester: Secondary | ICD-10-CM | POA: Insufficient documentation

## 2017-07-31 DIAGNOSIS — O2343 Unspecified infection of urinary tract in pregnancy, third trimester: Secondary | ICD-10-CM

## 2017-07-31 DIAGNOSIS — O09893 Supervision of other high risk pregnancies, third trimester: Secondary | ICD-10-CM

## 2017-07-31 DIAGNOSIS — Z3480 Encounter for supervision of other normal pregnancy, unspecified trimester: Secondary | ICD-10-CM

## 2017-07-31 DIAGNOSIS — O09299 Supervision of pregnancy with other poor reproductive or obstetric history, unspecified trimester: Secondary | ICD-10-CM | POA: Insufficient documentation

## 2017-07-31 DIAGNOSIS — B951 Streptococcus, group B, as the cause of diseases classified elsewhere: Secondary | ICD-10-CM

## 2017-07-31 DIAGNOSIS — O09899 Supervision of other high risk pregnancies, unspecified trimester: Secondary | ICD-10-CM

## 2017-07-31 DIAGNOSIS — Z8759 Personal history of other complications of pregnancy, childbirth and the puerperium: Secondary | ICD-10-CM

## 2017-07-31 MED ORDER — COMFORT FIT MATERNITY SUPP MED MISC: 0 refills | Status: DC

## 2017-07-31 NOTE — Progress Notes (Signed)
   PRENATAL VISIT NOTE  Subjective:  [name] [name] is a 26 y.o. [phone] at 3588w6d being seen today for ongoing prenatal care.  She is currently monitored for the following issues for this high-risk pregnancy and has Murmur, cardiac; Encounter for supervision of normal pregnancy, unspecified, unspecified trimester; History of gestational hypertension; Short interval between pregnancies affecting pregnancy, antepartum; Group B streptococcus urinary tract infection affecting pregnancy; and Gestational hypertension on her problem list.  Patient reports no complaints.  Contractions: Irregular. Vag. Bleeding: None.  Movement: Present. Denies leaking of fluid.   The following portions of the patient's history were reviewed and updated as appropriate: allergies, current medications, past family history, past medical history, past social history, past surgical history and problem list. Problem list updated.  Objective:   Vitals:   07/31/17 1037  BP: 139/85  Pulse: 90  Weight: 201 lb (91.2 kg)    Fetal Status: Fetal Heart Rate (bpm): NST    Movement: Present     General:  Alert, oriented and cooperative. Patient is in no acute distress.  Skin: Skin is warm and dry. No rash noted.   Cardiovascular: Normal heart rate noted  Respiratory: Normal respiratory effort, no problems with respiration noted  Abdomen: Soft, gravid, appropriate for gestational age.  Pain/Pressure: Present     Pelvic: Cervical exam deferred        Extremities: Normal range of motion.  Edema: Trace  Mental Status:  Normal mood and affect. Normal behavior. Normal judgment and thought content.   Assessment and Plan:  Pregnancy: G4P3003 at 6888w6d  1. History of gestational hypertension - Fetal nonstress test  2. Supervision of other normal pregnancy, antepartum Patient is doing well without complaints  3. Group B Streptococcus urinary tract infection affecting pregnancy in third trimester Will provide prophylaxis in  labor  4. Short interval between pregnancies affecting pregnancy, antepartum   5. Gestational hypertension, third trimester Follow up growth with doppler on 11/2 Continue antenatal testing NST reviewed and reactive with baseline 135, mod variability, +accels, no decels  Preterm labor symptoms and general obstetric precautions including but not limited to vaginal bleeding, contractions, leaking of fluid and fetal movement were reviewed in detail with the patient. Please refer to After Visit Summary for other counseling recommendations.  No Follow-up on file.   Catalina AntiguaPeggy Jaquavion Mccannon, MD

## 2017-07-31 NOTE — Progress Notes (Signed)
Pt c/o upper back pain for weeks per pt.

## 2017-08-04 ENCOUNTER — Encounter (HOSPITAL_COMMUNITY): Payer: Self-pay

## 2017-08-04 ENCOUNTER — Other Ambulatory Visit (HOSPITAL_COMMUNITY): Payer: Self-pay | Admitting: Obstetrics and Gynecology

## 2017-08-04 ENCOUNTER — Telehealth (HOSPITAL_COMMUNITY): Payer: Self-pay | Admitting: *Deleted

## 2017-08-04 ENCOUNTER — Encounter (HOSPITAL_COMMUNITY): Payer: Self-pay | Admitting: *Deleted

## 2017-08-04 ENCOUNTER — Ambulatory Visit (HOSPITAL_COMMUNITY)
Admission: RE | Admit: 2017-08-04 | Discharge: 2017-08-04 | Disposition: A | Discharge status: Home / Self Care | Payer: Medicaid Other | Source: Ambulatory Visit | Attending: Obstetrics and Gynecology | Admitting: Obstetrics and Gynecology

## 2017-08-04 ENCOUNTER — Other Ambulatory Visit (HOSPITAL_COMMUNITY): Payer: Self-pay | Admitting: *Deleted

## 2017-08-04 DIAGNOSIS — O365930 Maternal care for other known or suspected poor fetal growth, third trimester, not applicable or unspecified: Secondary | ICD-10-CM

## 2017-08-04 DIAGNOSIS — Z3A35 35 weeks gestation of pregnancy: Secondary | ICD-10-CM

## 2017-08-04 DIAGNOSIS — Z362 Encounter for other antenatal screening follow-up: Secondary | ICD-10-CM | POA: Diagnosis not present

## 2017-08-04 DIAGNOSIS — O365910 Maternal care for other known or suspected poor fetal growth, first trimester, not applicable or unspecified: Secondary | ICD-10-CM

## 2017-08-04 DIAGNOSIS — O1493 Unspecified pre-eclampsia, third trimester: Secondary | ICD-10-CM | POA: Diagnosis not present

## 2017-08-04 NOTE — Telephone Encounter (Signed)
Preadmission screen  

## 2017-08-09 ENCOUNTER — Other Ambulatory Visit (HOSPITAL_COMMUNITY)
Admission: RE | Admit: 2017-08-09 | Discharge: 2017-08-09 | Disposition: A | Discharge status: Home / Self Care | Payer: Medicaid Other | Source: Ambulatory Visit | Attending: Obstetrics & Gynecology | Admitting: Obstetrics & Gynecology

## 2017-08-09 ENCOUNTER — Ambulatory Visit (INDEPENDENT_AMBULATORY_CARE_PROVIDER_SITE_OTHER): Payer: Medicaid Other | Admitting: Obstetrics & Gynecology

## 2017-08-09 VITALS — BP 136/79 | HR 92 | Wt 204.2 lb

## 2017-08-09 DIAGNOSIS — Z3A36 36 weeks gestation of pregnancy: Secondary | ICD-10-CM | POA: Diagnosis not present

## 2017-08-09 DIAGNOSIS — O133 Gestational [pregnancy-induced] hypertension without significant proteinuria, third trimester: Secondary | ICD-10-CM | POA: Diagnosis not present

## 2017-08-09 DIAGNOSIS — O0993 Supervision of high risk pregnancy, unspecified, third trimester: Secondary | ICD-10-CM | POA: Insufficient documentation

## 2017-08-09 NOTE — Patient Instructions (Signed)
Labor Induction Labor induction is when steps are taken to cause a pregnant woman to begin the labor process. Most women go into labor on their own between 37 weeks and 42 weeks of the pregnancy. When this does not happen or when there is a medical need, methods may be used to induce labor. Labor induction causes a pregnant woman's uterus to contract. It also causes the cervix to soften (ripen), open (dilate), and thin out (efface). Usually, labor is not induced before 39 weeks of the pregnancy unless there is a problem with the baby or mother. Before inducing labor, your health care provider will consider a number of factors, including the following:  The medical condition of you and the baby.  How many weeks along you are.  The status of the baby's lung maturity.  The condition of the cervix.  The position of the baby. What are the reasons for labor induction? Labor may be induced for the following reasons:  The health of the baby or mother is at risk.  The pregnancy is overdue by 1 week or more.  The water breaks but labor does not start on its own.  The mother has a health condition or serious illness, such as high blood pressure, infection, placental abruption, or diabetes.  The amniotic fluid amounts are low around the baby.  The baby is distressed. Convenience or wanting the baby to be born on a certain date is not a reason for inducing labor. What methods are used for labor induction? Several methods of labor induction may be used, such as:  Prostaglandin medicine. This medicine causes the cervix to dilate and ripen. The medicine will also start contractions. It can be taken by mouth or by inserting a suppository into the vagina.  Inserting a thin tube (catheter) with a balloon on the end into the vagina to dilate the cervix. Once inserted, the balloon is expanded with water, which causes the cervix to open.  Stripping the membranes. Your health care provider separates  amniotic sac tissue from the cervix, causing the cervix to be stretched and causing the release of a hormone called progesterone. This may cause the uterus to contract. It is often done during an office visit. You will be sent home to wait for the contractions to begin. You will then come in for an induction.  Breaking the water. Your health care provider makes a hole in the amniotic sac using a small instrument. Once the amniotic sac breaks, contractions should begin. This may still take hours to see an effect.  Medicine to trigger or strengthen contractions. This medicine is given through an IV access tube inserted into a vein in your arm. All of the methods of induction, besides stripping the membranes, will be done in the hospital. Induction is done in the hospital so that you and the baby can be carefully monitored. How long does it take for labor to be induced? Some inductions can take up to 2-3 days. Depending on the cervix, it usually takes less time. It takes longer when you are induced early in the pregnancy or if this is your first pregnancy. If a mother is still pregnant and the induction has been going on for 2-3 days, either the mother will be sent home or a cesarean delivery will be needed. What are the risks associated with labor induction? Some of the risks of induction include:  Changes in fetal heart rate, such as too high, too low, or erratic.  Fetal distress.    Chance of infection for the mother and baby.  Increased chance of having a cesarean delivery.  Breaking off (abruption) of the placenta from the uterus (rare).  Uterine rupture (very rare). When induction is needed for medical reasons, the benefits of induction may outweigh the risks. What are some reasons for not inducing labor? Labor induction should not be done if:  It is shown that your baby does not tolerate labor.  You have had previous surgeries on your uterus, such as a myomectomy or the removal of  fibroids.  Your placenta lies very low in the uterus and blocks the opening of the cervix (placenta previa).  Your baby is not in a head-down position.  The umbilical cord drops down into the birth canal in front of the baby. This could cut off the baby's blood and oxygen supply.  You have had a previous cesarean delivery.  There are unusual circumstances, such as the baby being extremely premature. This information is not intended to replace advice given to you by your health care provider. Make sure you discuss any questions you have with your health care provider. Document Released: 02/08/2007 Document Revised: 02/25/2016 Document Reviewed: 04/18/2013 Elsevier Interactive Patient Education  2017 Elsevier Inc.  

## 2017-08-09 NOTE — Progress Notes (Signed)
GBS pos in urine. NST today dx: GHTN

## 2017-08-09 NOTE — Progress Notes (Signed)
   PRENATAL VISIT NOTE  Subjective:  [name] [name] is a 26 y.o. [phone] at 3228w1d being seen today for ongoing prenatal care.  She is currently monitored for the following issues for this high-risk pregnancy and has Murmur, cardiac; Encounter for supervision of normal pregnancy, unspecified, unspecified trimester; History of gestational hypertension; Short interval between pregnancies affecting pregnancy, antepartum; Group B streptococcus urinary tract infection affecting pregnancy; and Gestational hypertension on their problem list.  Patient reports no complaints.  Contractions: Irregular. Vag. Bleeding: None.  Movement: Present. Denies leaking of fluid.   The following portions of the patient's history were reviewed and updated as appropriate: allergies, current medications, past family history, past medical history, past social history, past surgical history and problem list. Problem list updated.  Objective:   Vitals:   08/09/17 0958  BP: 136/79  Pulse: 92  Weight: 204 lb 3.2 oz (92.6 kg)    Fetal Status: Fetal Heart Rate (bpm): NST    Movement: Present     General:  Alert, oriented and cooperative. Patient is in no acute distress.  Skin: Skin is warm and dry. No rash noted.   Cardiovascular: Normal heart rate noted  Respiratory: Normal respiratory effort, no problems with respiration noted  Abdomen: Soft, gravid, appropriate for gestational age.  Pain/Pressure: Present     Pelvic: Cervical exam deferred        Extremities: Normal range of motion.  Edema: Trace  Mental Status:  Normal mood and affect. Normal behavior. Normal judgment and thought content.   Assessment and Plan:  Pregnancy: G4P3003 at 3028w1d  1. Gestational hypertension, third trimester Had normal BPP 11/2 and repeat is scheduled on 11/9 - Fetal nonstress test  2. Supervision of high risk pregnancy, antepartum, third trimester IOL 37 weeks  - Urine cytology ancillary only  Preterm labor symptoms and general  obstetric precautions including but not limited to vaginal bleeding, contractions, leaking of fluid and fetal movement were reviewed in detail with the patient. Please refer to After Visit Summary for other counseling recommendations.  Return if symptoms worsen or fail to improve, for 6 week postprtum.   Scheryl DarterJames Rosalie Gelpi, MD

## 2017-08-10 LAB — URINE CYTOLOGY ANCILLARY ONLY
CHLAMYDIA, DNA PROBE: NEGATIVE
Neisseria Gonorrhea: NEGATIVE

## 2017-08-11 ENCOUNTER — Ambulatory Visit (HOSPITAL_COMMUNITY)
Admission: RE | Admit: 2017-08-11 | Discharge: 2017-08-11 | Disposition: A | Discharge status: Home / Self Care | Payer: Medicaid Other | Source: Ambulatory Visit | Attending: Certified Nurse Midwife | Admitting: Certified Nurse Midwife

## 2017-08-11 ENCOUNTER — Encounter (HOSPITAL_COMMUNITY): Payer: Self-pay | Admitting: *Deleted

## 2017-08-11 ENCOUNTER — Encounter (HOSPITAL_COMMUNITY): Payer: Self-pay | Admitting: Certified Registered Nurse Anesthetist

## 2017-08-11 ENCOUNTER — Inpatient Hospital Stay (HOSPITAL_COMMUNITY)
Admission: AD | Admit: 2017-08-11 | Discharge: 2017-08-14 | DRG: 805 | Disposition: A | Discharge status: Home / Self Care | Payer: Medicaid Other | Source: Ambulatory Visit | Attending: Obstetrics & Gynecology | Admitting: Obstetrics & Gynecology

## 2017-08-11 ENCOUNTER — Encounter (HOSPITAL_COMMUNITY): Payer: Self-pay

## 2017-08-11 DIAGNOSIS — O9902 Anemia complicating childbirth: Secondary | ICD-10-CM | POA: Diagnosis present

## 2017-08-11 DIAGNOSIS — O368990 Maternal care for other specified fetal problems, unspecified trimester, not applicable or unspecified: Secondary | ICD-10-CM | POA: Diagnosis present

## 2017-08-11 DIAGNOSIS — O99334 Smoking (tobacco) complicating childbirth: Secondary | ICD-10-CM | POA: Diagnosis present

## 2017-08-11 DIAGNOSIS — O326XX0 Maternal care for compound presentation, not applicable or unspecified: Secondary | ICD-10-CM | POA: Diagnosis present

## 2017-08-11 DIAGNOSIS — B951 Streptococcus, group B, as the cause of diseases classified elsewhere: Secondary | ICD-10-CM | POA: Diagnosis present

## 2017-08-11 DIAGNOSIS — Z7982 Long term (current) use of aspirin: Secondary | ICD-10-CM | POA: Diagnosis not present

## 2017-08-11 DIAGNOSIS — O365930 Maternal care for other known or suspected poor fetal growth, third trimester, not applicable or unspecified: Secondary | ICD-10-CM

## 2017-08-11 DIAGNOSIS — F17210 Nicotine dependence, cigarettes, uncomplicated: Secondary | ICD-10-CM | POA: Diagnosis present

## 2017-08-11 DIAGNOSIS — O99824 Streptococcus B carrier state complicating childbirth: Secondary | ICD-10-CM | POA: Diagnosis present

## 2017-08-11 DIAGNOSIS — O358XX0 Maternal care for other (suspected) fetal abnormality and damage, not applicable or unspecified: Principal | ICD-10-CM | POA: Diagnosis present

## 2017-08-11 DIAGNOSIS — O1490 Unspecified pre-eclampsia, unspecified trimester: Secondary | ICD-10-CM | POA: Diagnosis present

## 2017-08-11 DIAGNOSIS — O2340 Unspecified infection of urinary tract in pregnancy, unspecified trimester: Secondary | ICD-10-CM

## 2017-08-11 DIAGNOSIS — Z3A36 36 weeks gestation of pregnancy: Secondary | ICD-10-CM

## 2017-08-11 DIAGNOSIS — O1494 Unspecified pre-eclampsia, complicating childbirth: Secondary | ICD-10-CM | POA: Diagnosis present

## 2017-08-11 DIAGNOSIS — O09299 Supervision of pregnancy with other poor reproductive or obstetric history, unspecified trimester: Secondary | ICD-10-CM | POA: Diagnosis present

## 2017-08-11 DIAGNOSIS — D649 Anemia, unspecified: Secondary | ICD-10-CM | POA: Diagnosis present

## 2017-08-11 LAB — COMPREHENSIVE METABOLIC PANEL
ALK PHOS: 128 U/L — AB (ref 38–126)
ALT: 10 U/L — ABNORMAL LOW (ref 14–54)
ANION GAP: 7 (ref 5–15)
AST: 15 U/L (ref 15–41)
Albumin: 2.5 g/dL — ABNORMAL LOW (ref 3.5–5.0)
BUN: <5 mg/dL — ABNORMAL LOW (ref 6–20)
CALCIUM: 8.3 mg/dL — AB (ref 8.9–10.3)
CO2: 20 mmol/L — AB (ref 22–32)
Chloride: 106 mmol/L (ref 101–111)
Creatinine, Ser: 0.42 mg/dL — ABNORMAL LOW (ref 0.44–1.00)
Glucose, Bld: 78 mg/dL (ref 65–99)
Potassium: 3.7 mmol/L (ref 3.5–5.1)
SODIUM: 133 mmol/L — AB (ref 135–145)
Total Bilirubin: 0.2 mg/dL — ABNORMAL LOW (ref 0.3–1.2)
Total Protein: 6.5 g/dL (ref 6.5–8.1)

## 2017-08-11 LAB — PROTEIN / CREATININE RATIO, URINE
CREATININE, URINE: 48 mg/dL
PROTEIN CREATININE RATIO: 0.15 mg/mg{creat} (ref 0.00–0.15)
TOTAL PROTEIN, URINE: 7 mg/dL

## 2017-08-11 LAB — CBC
HCT: 27.8 % — ABNORMAL LOW (ref 36.0–46.0)
HEMOGLOBIN: 8.9 g/dL — AB (ref 12.0–15.0)
MCH: 21.9 pg — AB (ref 26.0–34.0)
MCHC: 32 g/dL (ref 30.0–36.0)
MCV: 68.3 fL — ABNORMAL LOW (ref 78.0–100.0)
PLATELETS: 359 10*3/uL (ref 150–400)
RBC: 4.07 MIL/uL (ref 3.87–5.11)
RDW: 15.6 % — AB (ref 11.5–15.5)
WBC: 10 10*3/uL (ref 4.0–10.5)

## 2017-08-11 LAB — TYPE AND SCREEN
ABO/RH(D): O POS
Antibody Screen: NEGATIVE

## 2017-08-11 MED ORDER — OXYTOCIN 40 UNITS IN LACTATED RINGERS INFUSION - SIMPLE MED
2.5000 [IU]/h | INTRAVENOUS | Status: DC
  Filled 2017-08-11: qty 1000

## 2017-08-11 MED ORDER — DIPHENHYDRAMINE HCL 50 MG/ML IJ SOLN: 12.5000 mg | INTRAMUSCULAR | Status: DC | PRN

## 2017-08-11 MED ORDER — BUTORPHANOL TARTRATE 1 MG/ML IJ SOLN
INTRAMUSCULAR | Status: AC
  Administered 2017-08-11: 1 mg
  Filled 2017-08-11: qty 1

## 2017-08-11 MED ORDER — FENTANYL 2.5 MCG/ML BUPIVACAINE 1/10 % EPIDURAL INFUSION (WH - ANES)
14.0000 mL/h | INTRAMUSCULAR | Status: DC | PRN
  Administered 2017-08-12 (×4): 14 mL/h via EPIDURAL
  Filled 2017-08-11 (×4): qty 100

## 2017-08-11 MED ORDER — TERBUTALINE SULFATE 1 MG/ML IJ SOLN
0.2500 mg | Freq: Once | INTRAMUSCULAR | Status: DC | PRN
  Filled 2017-08-11: qty 1

## 2017-08-11 MED ORDER — LACTATED RINGERS IV SOLN
INTRAVENOUS | Status: DC
  Administered 2017-08-11: 13:00:00 via INTRAVENOUS
  Administered 2017-08-12: 125 mL/h via INTRAVENOUS

## 2017-08-11 MED ORDER — BETAMETHASONE SOD PHOS & ACET 6 (3-3) MG/ML IJ SUSP
12.0000 mg | INTRAMUSCULAR | Status: AC
  Administered 2017-08-11 – 2017-08-12 (×2): 12 mg via INTRAMUSCULAR
  Filled 2017-08-11 (×2): qty 2

## 2017-08-11 MED ORDER — OXYCODONE-ACETAMINOPHEN 5-325 MG PO TABS: 2.0000 | ORAL_TABLET | ORAL | Status: DC | PRN

## 2017-08-11 MED ORDER — PHENYLEPHRINE 40 MCG/ML (10ML) SYRINGE FOR IV PUSH (FOR BLOOD PRESSURE SUPPORT)
80.0000 ug | PREFILLED_SYRINGE | INTRAVENOUS | Status: DC | PRN
  Filled 2017-08-11: qty 10
  Filled 2017-08-11: qty 5

## 2017-08-11 MED ORDER — FLEET ENEMA 7-19 GM/118ML RE ENEM: 1.0000 | ENEMA | RECTAL | Status: DC | PRN

## 2017-08-11 MED ORDER — PENICILLIN G POTASSIUM 5000000 UNITS IJ SOLR
5.0000 10*6.[IU] | Freq: Once | INTRAVENOUS | Status: AC
  Administered 2017-08-11: 5 10*6.[IU] via INTRAVENOUS
  Filled 2017-08-11: qty 5

## 2017-08-11 MED ORDER — ACETAMINOPHEN 325 MG PO TABS: 650.0000 mg | ORAL_TABLET | ORAL | Status: DC | PRN

## 2017-08-11 MED ORDER — PHENYLEPHRINE 40 MCG/ML (10ML) SYRINGE FOR IV PUSH (FOR BLOOD PRESSURE SUPPORT)
80.0000 ug | PREFILLED_SYRINGE | INTRAVENOUS | Status: DC | PRN
  Filled 2017-08-11: qty 5

## 2017-08-11 MED ORDER — EPHEDRINE 5 MG/ML INJ
10.0000 mg | INTRAVENOUS | Status: DC | PRN
  Filled 2017-08-11: qty 2

## 2017-08-11 MED ORDER — LACTATED RINGERS IV SOLN
500.0000 mL | Freq: Once | INTRAVENOUS | Status: AC
  Administered 2017-08-12: 500 mL via INTRAVENOUS

## 2017-08-11 MED ORDER — MISOPROSTOL 25 MCG QUARTER TABLET
25.0000 ug | ORAL_TABLET | ORAL | Status: DC | PRN
  Administered 2017-08-11 (×2): 25 ug via VAGINAL
  Filled 2017-08-11 (×2): qty 1

## 2017-08-11 MED ORDER — PENICILLIN G POT IN DEXTROSE 60000 UNIT/ML IV SOLN
3.0000 10*6.[IU] | INTRAVENOUS | Status: DC
  Administered 2017-08-11 – 2017-08-12 (×7): 3 10*6.[IU] via INTRAVENOUS
  Filled 2017-08-11 (×9): qty 50

## 2017-08-11 MED ORDER — OXYTOCIN BOLUS FROM INFUSION
500.0000 mL | Freq: Once | INTRAVENOUS | Status: AC
  Administered 2017-08-12: 500 mL via INTRAVENOUS

## 2017-08-11 MED ORDER — SOD CITRATE-CITRIC ACID 500-334 MG/5ML PO SOLN: 30.0000 mL | ORAL | Status: DC | PRN

## 2017-08-11 MED ORDER — LIDOCAINE HCL (PF) 1 % IJ SOLN
30.0000 mL | INTRAMUSCULAR | Status: DC | PRN
  Filled 2017-08-11: qty 30

## 2017-08-11 MED ORDER — ONDANSETRON HCL 4 MG/2ML IJ SOLN: 4.0000 mg | Freq: Four times a day (QID) | INTRAMUSCULAR | Status: DC | PRN

## 2017-08-11 MED ORDER — BUTORPHANOL TARTRATE 1 MG/ML IJ SOLN: 1.0000 mg | INTRAMUSCULAR | Status: DC | PRN

## 2017-08-11 MED ORDER — NICOTINE 14 MG/24HR TD PT24
14.0000 mg | MEDICATED_PATCH | Freq: Every day | TRANSDERMAL | Status: DC
  Administered 2017-08-11 – 2017-08-12 (×3): 14 mg via TRANSDERMAL
  Filled 2017-08-11 (×3): qty 1

## 2017-08-11 MED ORDER — LACTATED RINGERS IV SOLN: 500.0000 mL | INTRAVENOUS | Status: DC | PRN

## 2017-08-11 MED ORDER — FENTANYL CITRATE (PF) 100 MCG/2ML IJ SOLN
100.0000 ug | INTRAMUSCULAR | Status: DC | PRN
Start: 2017-08-11 — End: 2017-08-12
  Administered 2017-08-11 (×3): 100 ug via INTRAVENOUS
  Filled 2017-08-11 (×3): qty 2

## 2017-08-11 MED ORDER — OXYCODONE-ACETAMINOPHEN 5-325 MG PO TABS: 1.0000 | ORAL_TABLET | ORAL | Status: DC | PRN

## 2017-08-11 NOTE — Progress Notes (Signed)
Labor Progress Note [name] [name] is a 26 y.o. [phone] at 3848w3d presented for IOL new onset fetal pericardial effusion.  S: Pt is comfortable in bed at the moment. Feeling some contractions and is interested in Epidural but is willing to wait until further along  O:  BP 140/69   Pulse 79   Temp 98 F (36.7 C) (Oral)   Resp 20   Ht 5\' 6"  (1.676 m)   Wt 205 lb (93 kg)   LMP 11/29/2016   BMI 33.09 kg/m  EFM: 130/mod vari/+accels  CVE: Dilation: 2 Effacement (%): 50 Cervical Position: Posterior Station: -2 Presentation: Vertex Exam by:: amwalker,rn   A&P: 26 y.o. [id number] 3848w3d here for IOL for new onset fetal pericardial effusion #Labor: Progressing well. Does not want fb, will continue with Cytotec buccal. #Pain: per patient request, currently with IV fentanyl. plan for epidural #FWB: cat 1 #GBS positive #SGA: #PreEclampsia: BP well controlled 140/69, asymptomatic at the moment. Continue to monitor  #Anemia: Hb=8.9 Patient asymptomatic at the moment. Continue to monitor.  Suella BroadKeriann S Batoul Limes, MD 9:31 PM

## 2017-08-11 NOTE — Progress Notes (Signed)
LABOR PROGRESS NOTE  [name] [name] is a 26 y.o. [phone] at 7564w3d  admitted for IOL for new onset fetal pericardial effusion. Pregnancy also complicated by SGA, pre-eclampsia, and anemia.  Subjective: Pt starting to feel uncomfortable, feeling contractions.  Objective: BP (!) 143/77   Pulse 90   Temp 98.1 F (36.7 C) (Oral)   Resp 20   Ht 5\' 6"  (1.676 m)   Wt 205 lb (93 kg)   LMP 11/29/2016   BMI 33.09 kg/m  or  Vitals:   08/11/17 1642 08/11/17 1722 08/11/17 1727 08/11/17 1801  BP: (!) 147/77  (!) 136/58 (!) 143/77  Pulse: 97  94 90  Resp: 18 20 18 20   Temp: 98.1 F (36.7 C)     TempSrc: Oral     Weight:      Height:        SVE: Dilation: 2 Effacement (%): 50 Cervical Position: Posterior Station: -2 Presentation: Vertex Exam by:: Valentina Lucks. Woods, RN FHT: baseline rate 130, marked varibility, +acel, occasional variable decel Toco: ctx not tracing well   Assessment / Plan: 26 y.o. [id number] at 264w3d here for IOL for new onset fetal pericardial effusion. Pregnancy also complicated by SGA, pre-eclampsia, and anemia.  Labor: Cytotec placed at 1541. Did not want FB. Pt now contracting. Expectant management for now. Fetal Wellbeing:  Cat II Pain Control:  Received IV Fentanyl an hour ago. Planning on epidural Anticipated MOD:  SVD PreE: BP mild range  Frederik PearJulie P Degele, MD 08/11/2017, 6:20 PM

## 2017-08-11 NOTE — Anesthesia Pain Management Evaluation Note (Signed)
  CRNA Pain Management Visit Note  Patient: [name] [name], 26 y.o., female  "Hello I am a member of the anesthesia team at [facility]. We have an anesthesia team available at all times to provide care throughout the hospital, including epidural management and anesthesia for C-section. I don't know your plan for the delivery whether it a natural birth, water birth, IV sedation, nitrous supplementation, doula or epidural, but we want to meet your pain goals."   1.Was your pain managed to your expectations on prior hospitalizations?   Yes   2.What is your expectation for pain management during this hospitalization?     Epidural  3.How can we help you reach that goal? Epidural when ready.  Record the patient's initial score and the patient's pain goal.   Pain: 0  Pain Goal: 7 [facility] wants you to be able to say your pain was always managed very well.  [place] Droll L 08/11/2017

## 2017-08-11 NOTE — H&P (Signed)
LABOR AND DELIVERY ADMISSION HISTORY AND PHYSICAL NOTE  [name] [name] is a 26 y.o. female [phone] with IUP at 4666w3d by LMP, and pregnancy complicated by pre-eclampsia and SVA presenting for IOL due to new onset of fetal pericardial effusion which was noted on U/S today by MFM.  She reports positive fetal movement. She denies leakage of fluid or vaginal bleeding.  Pren[facility] History/Complications[facility] Pregnancy complications:  - Pre-eclampsia  - SGA: EFW 1963g, 4 lb 5 oz,  <10%, AC<3%,  on 08/04/17 - Anemia   Past Medical History: Past Medical History:  Diagnosis Date  . Anemia   . Anxiety   . Chlamydia 10-2011  . Cholecystitis   . Gestational hypertension   . Headache(784.0)   . Heart palpitations   . Hypertension     Past Surgical History: No past surgical history on file.  Obstetrical History: OB History    Gravida Para Term Preterm AB Living   4 3 3  0 0 3   SAB TAB Ectopic Multiple Live Births   0 0 0 0 3      Social History: Social History   Socioeconomic History  . Marital status: Single    Spouse name: Not on file  . Number of children: Not on file  . Years of education: Not on file  . Highest education level: Not on file  Social Needs  . Financial resource strain: Not on file  . Food insecurity - worry: Not on file  . Food insecurity - inability: Not on file  . Transportation needs - medical: Not on file  . Transportation needs - non-medical: Not on file  Occupational History  . Not on file  Tobacco Use  . Smoking status: Current Every Day Smoker    Packs/day: 0.25    Years: 3.00    Pack years: 0.75    Types: Cigarettes  . Smokeless tobacco: Never Used  Substance and Sexual Activity  . Alcohol use: No    Comment: socially  . Drug use: No  . Sexual activity: Yes    Birth control/protection: None  Other Topics Concern  . Not on file  Social History Narrative  . Not on file    Family History: Family History  Problem Relation Age  of Onset  . Diabetes Maternal Grandmother   . Hypertension Maternal Grandmother   . Cancer Maternal Grandmother   . Hypertension Mother   . Anesthesia problems Neg Hx   . Other Neg Hx     Allergies: Allergies  Allergen Reactions  . Metronidazole Nausea And Vomiting    Medications Prior to Admission  Medication Sig Dispense Refill Last Dose  . amoxicillin-clavulanate (AUGMENTIN) 875-125 MG tablet Take 1 tablet by mouth 2 (two) times daily. (Patient not taking: Reported on 07/21/2017) 14 tablet 0 Not Taking  . aspirin 81 MG chewable tablet Chew by mouth daily.   Taking  . bacitracin-neomycin-polymyxin-hydrocortisone (CORTISPORIN) 1 % ointment Apply 1 application topically 2 (two) times daily. (Patient not taking: Reported on 07/21/2017) 30 g 2 Not Taking  . butalbital-acetaminophen-caffeine (FIORICET, ESGIC) 50-325-40 MG tablet Take 1-2 tablets by mouth every 6 (six) hours as needed. 45 tablet 4 Taking  . Elastic Bandages & Supports (COMFORT FIT MATERNITY SUPP MED) MISC Wear daily when ambulating 1 each 0 Taking  . iron polysaccharides (NIFEREX) 150 MG capsule Take 1 capsule (150 mg total) by mouth 2 (two) times daily. With orange juice 60 capsule 3 Taking  . Prenatal-DSS-FeCb-FeGl-FA (CITRANATAL BLOOM) 90-1  MG TABS Take 1 tablet by mouth daily. 30 tablet 12 Taking     Review of Systems  All systems reviewed and negative except as stated in HPI  Physical Exam Blood pressure (!) 149/79, pulse 83, temperature 98.1 F (36.7 C), temperature source Oral, resp. rate 20, height 5\' 6"  (1.676 m), weight 205 lb (93 kg), last menstrual period 11/29/2016, unknown if currently breastfeeding. General appearance: alert and cooperative Lungs: clear to auscultation bilaterally Heart: regular rate and rhythm Abdomen: soft, non-tender; bowel sounds normal Extremities: No calf swelling or tenderness Presentation: cephalic Fetal monitoring: baseline rate 130, moderate varibility, +acel, no  decelf Uterine activity: occasional contraction, uterine  SVE: Dilation: 1 Effacement (%): Thick Station: -3 Exam by:: Dr. Nira Retortegele  Prenatal labs: ABO, Rh: O/Positive/-- (07/09 1527) Antibody: Comment, See Final Results (07/09 1527) Rubella: 2.95 (07/09 1527) RPR: Non Reactive (09/06 1936)  HBsAg: Negative (07/09 1527)  HIV:   Nonreactive GC/Chlamydia: negative/negative GBS:   +GBS bacteruria this pregnancy 2 hr GTT: normal Genetic screening:  Normal quad screen Anatomy US: normal survey at 20 weeks; F/u ultrasound with SGA (EFW 1963g, 4 lb 5 oz, <10%), and repeat U/S today with pericardial effusion  Prenatal Transfer Tool  Maternal Diabetes: No Genetic Screening: Normal Maternal Ultrasounds/Referrals: Normal Fetal Ultrasounds or other Referrals:  None Maternal Substance Abuse:  No Significant Maternal Medications:  None Significant Maternal Lab Results: Lab values include: Group B Strep positive (urine)  No results found for this or any previous visit (from the past 24 hour(s)).  Patient Active Problem List   Diagnosis Date Noted  . Fetal pericardial effusion affecting management of mother 08/11/2017  . Gestational hypertension 07/31/2017  . Group B streptococcus urinary tract infection affecting pregnancy 04/24/2017  . Encounter for supervision of normal pregnancy, unspecified, unspecified trimester 04/10/2017  . History of gestational hypertension 04/10/2017  . Short interval between pregnancies affecting pregnancy, antepartum 04/10/2017  . Murmur, cardiac 02/10/2016    Assessment: [name] [name] is a 26 y.o. G4P3003 at 6715w3d here for IOL for fetal pericardial effusion on U/S today. Pregnancy also complicated by SGA, pre-eclampsia, and anemia.  #Labor: Start cervical ripening with cytotec. She does not want FB placed #Pain: Per patient's request #FWB: Cat I #ID:  GBS pos, start PCN #MOF: bottle #MOC: OCPs #Circ:  Outpatient  Kandra NicolasJulie P Laiya Wisby 08/11/2017, 12:25 PM

## 2017-08-12 ENCOUNTER — Inpatient Hospital Stay (HOSPITAL_COMMUNITY): Payer: Medicaid Other | Admitting: Anesthesiology

## 2017-08-12 ENCOUNTER — Encounter (HOSPITAL_COMMUNITY): Payer: Self-pay

## 2017-08-12 DIAGNOSIS — O358XX0 Maternal care for other (suspected) fetal abnormality and damage, not applicable or unspecified: Secondary | ICD-10-CM

## 2017-08-12 DIAGNOSIS — O99824 Streptococcus B carrier state complicating childbirth: Secondary | ICD-10-CM

## 2017-08-12 DIAGNOSIS — Z3A36 36 weeks gestation of pregnancy: Secondary | ICD-10-CM

## 2017-08-12 DIAGNOSIS — O9902 Anemia complicating childbirth: Secondary | ICD-10-CM

## 2017-08-12 DIAGNOSIS — O365930 Maternal care for other known or suspected poor fetal growth, third trimester, not applicable or unspecified: Secondary | ICD-10-CM

## 2017-08-12 LAB — CBC
HCT: 27.1 % — ABNORMAL LOW (ref 36.0–46.0)
Hemoglobin: 9 g/dL — ABNORMAL LOW (ref 12.0–15.0)
MCH: 22.1 pg — ABNORMAL LOW (ref 26.0–34.0)
MCHC: 33.2 g/dL (ref 30.0–36.0)
MCV: 66.4 fL — ABNORMAL LOW (ref 78.0–100.0)
Platelets: 339 K/uL (ref 150–400)
RBC: 4.08 MIL/uL (ref 3.87–5.11)
RDW: 15.7 % — ABNORMAL HIGH (ref 11.5–15.5)
WBC: 12.7 K/uL — ABNORMAL HIGH (ref 4.0–10.5)

## 2017-08-12 LAB — RPR: RPR: NONREACTIVE

## 2017-08-12 MED ORDER — PRENATAL MULTIVITAMIN CH
1.0000 | ORAL_TABLET | Freq: Every day | ORAL | Status: DC
  Administered 2017-08-13 – 2017-08-14 (×2): 1 via ORAL
  Filled 2017-08-12 (×2): qty 1

## 2017-08-12 MED ORDER — SENNOSIDES-DOCUSATE SODIUM 8.6-50 MG PO TABS
2.0000 | ORAL_TABLET | ORAL | Status: DC
  Administered 2017-08-12 – 2017-08-13 (×2): 2 via ORAL
  Filled 2017-08-12 (×2): qty 2

## 2017-08-12 MED ORDER — MISOPROSTOL 200 MCG PO TABS
50.0000 ug | ORAL_TABLET | ORAL | Status: DC | PRN
  Administered 2017-08-12: 50 ug via BUCCAL
  Filled 2017-08-12: qty 1

## 2017-08-12 MED ORDER — OXYTOCIN 40 UNITS IN LACTATED RINGERS INFUSION - SIMPLE MED
1.0000 m[IU]/min | INTRAVENOUS | Status: DC
  Administered 2017-08-12: 2 m[IU]/min via INTRAVENOUS

## 2017-08-12 MED ORDER — SODIUM BICARBONATE 8.4 % IV SOLN
INTRAVENOUS | Status: DC | PRN
  Administered 2017-08-12 (×2): 4 mL via EPIDURAL

## 2017-08-12 MED ORDER — COCONUT OIL OIL: 1.0000 "application " | TOPICAL_OIL | Status: DC | PRN

## 2017-08-12 MED ORDER — BENZOCAINE-MENTHOL 20-0.5 % EX AERO
1.0000 "application " | INHALATION_SPRAY | CUTANEOUS | Status: DC | PRN
  Administered 2017-08-13: 1 via TOPICAL
  Filled 2017-08-12: qty 56

## 2017-08-12 MED ORDER — PHENYLEPHRINE 40 MCG/ML (10ML) SYRINGE FOR IV PUSH (FOR BLOOD PRESSURE SUPPORT): 80.0000 ug | PREFILLED_SYRINGE | INTRAVENOUS | Status: DC | PRN

## 2017-08-12 MED ORDER — LIDOCAINE HCL (PF) 1 % IJ SOLN
INTRAMUSCULAR | Status: DC | PRN
  Administered 2017-08-12: 5 mL via EPIDURAL
  Administered 2017-08-12: 6 mL via EPIDURAL
  Administered 2017-08-12: 5 mL via EPIDURAL

## 2017-08-12 MED ORDER — SIMETHICONE 80 MG PO CHEW: 80.0000 mg | CHEWABLE_TABLET | ORAL | Status: DC | PRN

## 2017-08-12 MED ORDER — EPHEDRINE 5 MG/ML INJ: 10.0000 mg | INTRAVENOUS | Status: DC | PRN

## 2017-08-12 MED ORDER — ACETAMINOPHEN 325 MG PO TABS
650.0000 mg | ORAL_TABLET | ORAL | Status: DC | PRN
  Administered 2017-08-14 (×2): 650 mg via ORAL
  Filled 2017-08-12 (×2): qty 2

## 2017-08-12 MED ORDER — LACTATED RINGERS IV SOLN: 500.0000 mL | Freq: Once | INTRAVENOUS | Status: DC

## 2017-08-12 MED ORDER — FENTANYL 2.5 MCG/ML BUPIVACAINE 1/10 % EPIDURAL INFUSION (WH - ANES): 14.0000 mL/h | INTRAMUSCULAR | Status: DC | PRN

## 2017-08-12 MED ORDER — TETANUS-DIPHTH-ACELL PERTUSSIS 5-2.5-18.5 LF-MCG/0.5 IM SUSP: 0.5000 mL | Freq: Once | INTRAMUSCULAR | Status: DC

## 2017-08-12 MED ORDER — DIBUCAINE 1 % RE OINT: 1.0000 "application " | TOPICAL_OINTMENT | RECTAL | Status: DC | PRN

## 2017-08-12 MED ORDER — ZOLPIDEM TARTRATE 5 MG PO TABS: 5.0000 mg | ORAL_TABLET | Freq: Every evening | ORAL | Status: DC | PRN

## 2017-08-12 MED ORDER — DIPHENHYDRAMINE HCL 50 MG/ML IJ SOLN: 12.5000 mg | INTRAMUSCULAR | Status: DC | PRN

## 2017-08-12 MED ORDER — IBUPROFEN 600 MG PO TABS
600.0000 mg | ORAL_TABLET | Freq: Four times a day (QID) | ORAL | Status: DC
  Administered 2017-08-12 – 2017-08-14 (×7): 600 mg via ORAL
  Filled 2017-08-12 (×7): qty 1

## 2017-08-12 MED ORDER — WITCH HAZEL-GLYCERIN EX PADS: 1.0000 "application " | MEDICATED_PAD | CUTANEOUS | Status: DC | PRN

## 2017-08-12 MED ORDER — MISOPROSTOL 50MCG HALF TABLET
50.0000 ug | ORAL_TABLET | ORAL | Status: DC
  Administered 2017-08-12: 50 ug via BUCCAL

## 2017-08-12 MED ORDER — ONDANSETRON HCL 4 MG/2ML IJ SOLN: 4.0000 mg | INTRAMUSCULAR | Status: DC | PRN

## 2017-08-12 MED ORDER — ONDANSETRON HCL 4 MG PO TABS: 4.0000 mg | ORAL_TABLET | ORAL | Status: DC | PRN

## 2017-08-12 MED ORDER — DIPHENHYDRAMINE HCL 25 MG PO CAPS: 25.0000 mg | ORAL_CAPSULE | Freq: Four times a day (QID) | ORAL | Status: DC | PRN

## 2017-08-12 NOTE — Plan of Care (Signed)
Progressing appropriately. Paperwork reviewed.

## 2017-08-12 NOTE — Anesthesia Preprocedure Evaluation (Signed)
Anesthesia Evaluation  Patient identified by MRN, date of birth, ID band Patient awake    Reviewed: Allergy & Precautions, Patient's Chart, lab work & pertinent test results  Airway Mallampati: I       Dental  (+) Teeth Intact   Pulmonary Current Smoker,    breath sounds clear to auscultation       Cardiovascular hypertension,  Rhythm:Regular Rate:Normal     Neuro/Psych  Headaches, PSYCHIATRIC DISORDERS Anxiety    GI/Hepatic negative GI ROS, Neg liver ROS,   Endo/Other  negative endocrine ROS  Renal/GU negative Renal ROS  negative genitourinary   Musculoskeletal negative musculoskeletal ROS (+)   Abdominal   Peds negative pediatric ROS (+)  Hematology negative hematology ROS (+)   Anesthesia Other Findings   Reproductive/Obstetrics (+) Pregnancy                             Lab Results  Component Value Date   WBC 12.7 (H) 08/12/2017   HGB 9.0 (L) 08/12/2017   HCT 27.1 (L) 08/12/2017   MCV 66.4 (L) 08/12/2017   PLT 339 08/12/2017   Lab Results  Component Value Date   INR 1.13 02/11/2016     Anesthesia Physical  Anesthesia Plan  ASA: II  Anesthesia Plan: Epidural   Post-op Pain Management:    Induction:   PONV Risk Score and Plan:   Airway Management Planned: Natural Airway  Additional Equipment:   Intra-op Plan:   Post-operative Plan:   Informed Consent: I have reviewed the patients History and Physical, chart, labs and discussed the procedure including the risks, benefits and alternatives for the proposed anesthesia with the patient or authorized representative who has indicated his/her understanding and acceptance.   Dental advisory given  Plan Discussed with: Anesthesiologist  Anesthesia Plan Comments:         Anesthesia Quick Evaluation

## 2017-08-12 NOTE — Anesthesia Procedure Notes (Signed)
Epidural Patient location during procedure: OB Start time: 08/12/2017 2:43 AM End time: 08/12/2017 2:55 AM  Staffing Anesthesiologist: Heather RobertsSinger, Binnie Vonderhaar, MD Performed: anesthesiologist   Preanesthetic Checklist Completed: patient identified, site marked, pre-op evaluation, timeout performed, IV checked, risks and benefits discussed and monitors and equipment checked  Epidural Patient position: sitting Prep: DuraPrep Patient monitoring: heart rate, cardiac monitor, continuous pulse ox and blood pressure Approach: midline Location: L2-L3 Injection technique: LOR saline  Needle:  Needle type: Tuohy  Needle gauge: 17 G Needle length: 9 cm Needle insertion depth: 6 cm Catheter size: 20 Guage Catheter at skin depth: 11 cm Test dose: negative and Other  Assessment Events: blood not aspirated, injection not painful, no injection resistance and negative IV test  Additional Notes Informed consent obtained prior to proceeding including risk of failure, 1% risk of PDPH, risk of minor discomfort and bruising.  Discussed rare but serious complications including epidural abscess, permanent nerve injury, epidural hematoma.  Discussed alternatives to epidural analgesia and patient desires to proceed.  Timeout performed pre-procedure verifying patient name, procedure, and platelet count.  Patient tolerated procedure well.

## 2017-08-12 NOTE — Progress Notes (Signed)
[name] [name] is a 26 y.o. [phone] at 572w4d admitted for induction of labor due to fetal pericardial effusion and SGA (<10 percentile).  Subjective: [name] [name] is asleep resting comfortably in bed. Per family and nurse she was having some discomfort but she was given another bolus via epidural and that has relieved her pain. She had her membranes rupture by Thressa ShellerHeather Hogan and an IUPC was placed.  Objective: BP (!) 147/75 (BP Location: Left Arm)   Pulse 94   Temp 98.7 F (37.1 C) (Oral)   Resp 16   Ht 5\' 6"  (1.676 m)   Wt 205 lb (93 kg)   LMP 11/29/2016   SpO2 98%   BMI 33.09 kg/m  No intake/output data recorded. Total I/O In: -  Out: 2600 [Urine:2600]  FHT:  FHR: 130 bpm, variability: minimal ,  accelerations:  Abscent,  decelerations:  Present one early decel UC:   irregular, every 2-4 minutes SVE:   Dilation: 4 Effacement (%): 80 Station: -2 Exam by:: CNM Heather H  Labs: Lab Results  Component Value Date   WBC 12.7 (H) 08/12/2017   HGB 9.0 (L) 08/12/2017   HCT 27.1 (L) 08/12/2017   MCV 66.4 (L) 08/12/2017   PLT 339 08/12/2017    Assessment / Plan: Induction of labor due to fetal pericardial effusion and SGA,  progressing well on pitocin  Labor: Progressing on Pitocin, AROM performed and IUPC in place Preeclampsia:  n/a Fetal Wellbeing:  Category I Pain Control:  Epidural I/D:  GBS pos - PCN Anticipated MOD:  NSVD  [name] [name] 08/12/2017, 6:12 PM

## 2017-08-12 NOTE — Anesthesia Procedure Notes (Signed)
Epidural Patient location during procedure: OB Start time: 08/12/2017 5:40 PM  Staffing Anesthesiologist: Mal AmabileFoster, Silvio Sausedo, MD Performed: anesthesiologist   Preanesthetic Checklist Completed: patient identified, site marked, surgical consent, pre-op evaluation, timeout performed, IV checked, risks and benefits discussed and monitors and equipment checked  Epidural Patient position: sitting Prep: site prepped and draped and DuraPrep Patient monitoring: continuous pulse ox and blood pressure Approach: midline Location: L3-L4 Injection technique: LOR air  Needle:  Needle type: Tuohy  Needle gauge: 17 G Needle length: 9 cm and 9 Needle insertion depth: 5 cm cm Catheter type: closed end flexible Catheter size: 19 Gauge Catheter at skin depth: 10 cm Test dose: negative and Other  Assessment Events: blood not aspirated, injection not painful, no injection resistance, negative IV test and no paresthesia  Additional Notes Patient identified. Risks and benefits discussed including failed block, incomplete  Pain control, post dural puncture headache, nerve damage, paralysis, blood pressure Changes, nausea, vomiting, reactions to medications-both toxic and allergic and post Partum back pain. All questions were answered. Patient expressed understanding and wished to proceed. Sterile technique was used throughout procedure. Epidural site was Dressed with sterile barrier dressing. No paresthesias, signs of intravascular injection Or signs of intrathecal spread were encountered.  Patient was more comfortable after the epidural was dosed. Please see RN's note for documentation of vital signs and FHR which are stable.

## 2017-08-12 NOTE — Progress Notes (Signed)
Labor Progress Note [name] [name] is a 26 y.o. [phone] at 5760w3d presented for IOL new onset fetal pericardial effusion.   S: Pt just received epidural. Was having pain with contractions.  O:  BP (!) 158/81   Pulse 82   Temp 98 F (36.7 C)   Resp 20   Ht 5\' 6"  (1.676 m)   Wt 93 kg (205 lb)   LMP 11/29/2016   BMI 33.09 kg/m  EFM: 130/mod vari/+accels/-decels  CVE: Dilation: 2 Effacement (%): 70 Cervical Position: Posterior Station: -2 Presentation: Vertex Exam by:: [email or web address],rn   A&P: 26 y.o. [id number] 6660w3d here for IOL for new onset fetal pericardial effusion  #Labor: Progressing well. Does not want fb, will continue with Cytotec buccal. #Pain: Epidural #FWB: cat 1 #PreEclampsia: BPs not in severe range, asymptomatic at the moment. Continue to monitor.  Caryl AdaJazma Phelps, DO 2:11 AM

## 2017-08-12 NOTE — Consult Note (Signed)
Neonatology Note:   Attendance at Delivery:    I was asked by Dr. Lockamy to attend this NSVD at 36 4/7 weeks due to known IUGR and pericardial effusion. The mother is a G4P3 O pos, GBS positive with pre-eclampsia and IOL due to new onset of fetal pericardial effusiion. She smoked 1/4 pack cigarettes/day during pregnancy. She was treated with Betamethasone X 2 and got Pen G > 4 hours prior to delivery. Afebrile during labor. ROM 2 hours prior to delivery, fluid clear. Infant vigorous with good spontaneous cry and tone. Delayed cord clamping was done. Needed no suctioning. Ap 9/9. Lungs clear to ausc in DR, no respiratory distress, and heart sounds normal, perfusion good. His weight is 2255 grams. I spoke with his mother about the risks for hypothermia and hypoglycemia due to his small size, and encouraged her to PC with formula for at least the first few hours to insure adequate intake. The baby is vigorous and appears asymptomatic from the pericardial effusion, so is being allowed to remain with his mother. To MBU status to care of Pediatrician.   Merridy Pascoe C. Kandon Hosking, MD   

## 2017-08-13 MED ORDER — AMLODIPINE BESYLATE 5 MG PO TABS
5.0000 mg | ORAL_TABLET | Freq: Every day | ORAL | Status: DC
  Administered 2017-08-13 – 2017-08-14 (×2): 5 mg via ORAL
  Filled 2017-08-13 (×3): qty 1

## 2017-08-13 NOTE — Progress Notes (Signed)
POSTPARTUM PROGRESS NOTE  Post Partum Day 1 Subjective:  [name] [name] is a 26 y.o. [id number] 6766w4d s/p NSVD.  No acute events overnight.  Pt denies problems with ambulating, voiding or po intake.  She denies nausea or vomiting.  Pain is well controlled.  Lochia Minimal.   Objective: Blood pressure 135/75, pulse 81, temperature 98.1 F (36.7 C), temperature source Oral, resp. rate 18, height 5\' 6"  (1.676 m), weight 205 lb (93 kg), last menstrual period 11/29/2016, SpO2 100 %, unknown if currently breastfeeding.  Physical Exam:  General: alert, cooperative and no distress Lochia:normal flow Chest: no respiratory distress Heart:regular rate, distal pulses intact Abdomen: soft, nontender,  Uterine Fundus: firm, appropriately tender DVT Evaluation: No calf swelling or tenderness Extremities: no edema  Recent Labs    08/11/17 1310 08/12/17 0202  HGB 8.9* 9.0*  HCT 27.8* 27.1*    Assessment/Plan:  ASSESSMENT: [name] Cunasrika Hartnett is a 26 y.o. [id number] 8366w4d s/p NSVD. Start Norvasc 5 mg for elevated blood pressures overnight.  Plan for discharge tomorrow   LOS: 2 days   Chubb Corporationmber Prairie Stenberg, DO 08/13/2017, 7:07 AM

## 2017-08-13 NOTE — Lactation Note (Addendum)
This note was copied from a baby's chart. Lactation Consultation Note  [name] GNFAO'ZToday's Date: 08/13/2017 Reason for consult: Late-preterm 34-36.6wks;Initial assessment;Infant < 6lbs   P4, Baby 18 hours old.  3246w4d < 5 lbs.  She breastfed her other children for 7, 4, 2 months. Older daughter had frenotomy at 3 week.  Noted short anterior lingual frenulum causing tongue indention on this baby. Mother recently pumped 2 ml of colostrum.  She states she has been leaking colostrum since 5 months. Encouraged mother to hand express which she did easily.  Mother was able to express an additional 5 ml. Unwrapped baby for feeding.   Mother latched baby in football hold for approx 15 min.  Sucks and swallows observed. Mother stated that baby has been spitty after breastfeeding with supplementation. Recommend mother hold baby upright for 15 min before supplementation and burp baby. Discussed possibly alternating with breastfeeding and supplementation but baby did take approx 5 ml after breastfeeding via finger syringe. Reviewed LPI information sheet including keeping feedings to 30 min. Provided option of hand expressing for supplementation if mother receives more volume than with pumping. Suggest mother post pump 4-6 times per day and give baby back volume pumped. Discussed supplementation volume guidelines and [facility]. Mom encouraged to feed baby 8-12 times/24 hours and with feeding cues at least q 3 hours. Mother given lactation brochure and feeding sheet. Discussed option of using slow flow nipple but for now mother feels comfortable with finger syringe feeding.  LC noted some gagging w/ finger syringe.      Maternal Data Has patient been taught Hand Expression?: Yes Does the patient have breastfeeding experience prior to this delivery?: Yes  Feeding Feeding Type: Breast Fed Length of feed: 15 min  LATCH Score Latch: Grasps breast  easily, tongue down, lips flanged, rhythmical sucking.  Audible Swallowing: A few with stimulation  Type of Nipple: Everted at rest and after stimulation  Comfort (Breast/Nipple): Soft / non-tender  Hold (Positioning): Assistance needed to correctly position infant at breast and maintain latch.  LATCH Score: 8  Interventions Interventions: Breast feeding basics reviewed;Assisted with latch;Skin to skin;Hand express;DEBP  Lactation Tools Discussed/Used     Consult Status Consult Status: Follow-up Date: 08/14/17 Follow-up type: In-patient    [name] [name], [name] [name] 08/13/2017, 3:11 PM

## 2017-08-13 NOTE — Plan of Care (Signed)
Progressing appropriately.

## 2017-08-13 NOTE — Anesthesia Postprocedure Evaluation (Signed)
Anesthesia Post Note  Patient: [name] [name]  Procedure(s) Performed: AN AD HOC LABOR EPIDURAL     Patient location during evaluation: Mother Baby Anesthesia Type: Epidural Level of consciousness: awake and alert Pain management: pain level controlled Vital Signs Assessment: post-procedure vital signs reviewed and stable Respiratory status: spontaneous breathing, nonlabored ventilation and respiratory function stable Cardiovascular status: stable Postop Assessment: no headache, no backache, epidural receding and patient able to bend at knees Anesthetic complications: no    Last Vitals:  Vitals:   08/13/17 0314 08/13/17 0534  BP: 129/78 135/75  Pulse: 79 81  Resp: 18   Temp: 36.7 C   SpO2:      Last Pain:  Vitals:   08/13/17 0534  TempSrc:   PainSc: 0-No pain   Pain Goal:                 Rica RecordsICKELTON,[name] [name]

## 2017-08-14 MED ORDER — AMLODIPINE BESYLATE 5 MG PO TABS: 5.0000 mg | ORAL_TABLET | Freq: Every day | ORAL | 2 refills | Status: DC

## 2017-08-14 NOTE — Discharge Summary (Signed)
OB Discharge Summary     [name] DOB: March 10, 1991 MRN: [id number] Date of admission: 08/11/2017  Delivering MD: Arlyce HarmanLOCKAMY, TIMOTHY )  Date of discharge: 08/14/2017    Admitting diagnosis: pregnancy at [redacted]weeks gestation Intrauterine pregnancy: 548w4d    Secondary diagnosis:  Active Problems:   Patient Active Problem List   Diagnosis Date Noted  . Fetal pericardial effusion affecting management of mother 08/11/2017  . Pre-eclampsia 07/31/2017  . Group B streptococcus urinary tract infection affecting pregnancy 04/24/2017  . Encounter for supervision of normal pregnancy, unspecified, unspecified trimester 04/10/2017  . History of gestational hypertension 04/10/2017  . Short interval between pregnancies affecting pregnancy, antepartum 04/10/2017  . Murmur, cardiac 02/10/2016    Additional problems: none     Discharge diagnosis: Preterm Pregnancy Delivered                                                                                                Post partum procedures:none   Complications: None  Hospital course:  Induction of Labor With Vaginal Delivery   26 y.o. yo [phone] at [redacted]w[redacted]d was admitted to the hospital 08/11/2017 for induction of labor.  Indication for induction: fetal pericardial effusion and SGA.  Patient had an uncomplicated labor course as follows: Membrane Rupture Time/Date: 5:03 PM ,08/12/2017   Intrapartum Procedures: Episiotomy: None [1]                                         Lacerations:  None [1]  Patient had delivery of a Viable infant.  Information for the patient's newborn:  Magda Paganinidams, Boy Marlon [[id number]][[id number]]  Delivery Method: Vag-Spont   08/12/2017  Details of delivery can be found in separate delivery note.  Patient had a routine postpartum course. Patient is discharged home 08/14/17.  Physical exam  Vitals:   08/13/17 1900 08/14/17 0546  BP: (!) 146/76 (!) 142/80  Pulse: 76 76  Resp: 20 18  Temp: 98.4 F (36.9 C) 97.8 F (36.6 C)   SpO2:      General: alert, cooperative and no distress Lochia: appropriate Uterine Fundus: firm Incision: N/A DVT Evaluation: No evidence of DVT seen on physical exam.  Labs: No results found for this or any previous visit (from the past 24 hour(s)).   Discharge instruction: per After Visit Summary and "Baby and Me Booklet".  After visit meds:  Allergies  Allergen Reactions  . Metronidazole Nausea And Vomiting    Allergies as of 08/14/2017      Reactions   Metronidazole Nausea And Vomiting      Medication List    STOP taking these medications   amoxicillin-clavulanate 875-125 MG tablet Commonly known as:  AUGMENTIN   aspirin 81 MG chewable tablet   bacitracin-neomycin-polymyxin-hydrocortisone 1 % ointment Commonly known as:  CORTISPORIN   butalbital-acetaminophen-caffeine 50-325-40 MG tablet Commonly known as:  FIORICET, ESGIC   COMFORT FIT MATERNITY SUPP MED Misc   docusate sodium 100 MG capsule Commonly known as:  COLACE   iron polysaccharides 150 MG capsule  Commonly known as:  NIFEREX     TAKE these medications   amLODipine 5 MG tablet Commonly known as:  NORVASC Take 1 tablet (5 mg total) daily by mouth.   CITRANATAL BLOOM 90-1 MG Tabs Take 1 tablet by mouth daily.        Diet: routine diet  Activity: Advance as tolerated. Pelvic rest for 6 weeks.   Outpatient follow up:4 weeks Future Appointments:  Future Appointments  Date Time Provider Department Center  08/21/2017 12:45 PM [facility] None  08/21/2017  1:20 PM Johney MaineKale, Gautam Kishore, MD [facility]    Follow up Appt: No Follow-up on file.   Newborn Data: APGAR (1 MIN): 9   APGAR (5 MINS): 9     Baby Feeding: Bottle and Breast Disposition:home with mother  Rolm Bookbindermber Marijose Curington, DO  08/14/2017

## 2017-08-14 NOTE — Lactation Note (Signed)
This note was copied from a baby's chart. Lactation Consultation Note  [name] [id number] Date: 08/14/2017 Reason for consult: Follow-up assessment  Baby 69 hours old. Mom reports that she is continuing to offer breast first and then supplement with formula. Mom states that she will give breast milk as long as she can, but is not sure how this will work when she gets home. Mom given manual pump and mom aware how to use piston in pumping kit. Enc mom to take pumping kit with her at D/C, and to call [facility] office on Tuesday, 08/15/17. Mom aware of OP/BFSG and [place] phone line assistance after D/C.  Maternal Data    Feeding Feeding Type: Breast Fed Length of feed: 20 min  LATCH Score                   Interventions    Lactation Tools Discussed/Used     Consult Status Consult Status: PRN    Andres Labrum 08/14/2017, 9:17 AM

## 2017-08-15 ENCOUNTER — Ambulatory Visit: Payer: Self-pay

## 2017-08-15 ENCOUNTER — Inpatient Hospital Stay (HOSPITAL_COMMUNITY): Admission: RE | Admit: 2017-08-15 | Payer: No Typology Code available for payment source | Source: Ambulatory Visit

## 2017-08-15 NOTE — Lactation Note (Signed)
This note was copied from a baby's chart. Lactation Consultation Note  [name] [id number]'s Date: 08/15/2017 Reason for consult: Follow-up assessment  Baby 61 hours old. Mom reports that she is nursing and then supplementing with formula, and then post-pumping. Mom requested a curve-tipped syringe and it was given. Mom supplementing formula with syringe and finger. Enc mom to call PRN as needed.   Maternal Data    Feeding Feeding Type: Formula  LATCH Score Latch: Grasps breast easily, tongue down, lips flanged, rhythmical sucking.  Audible Swallowing: A few with stimulation  Type of Nipple: Everted at rest and after stimulation  Comfort (Breast/Nipple): Soft / non-tender  Hold (Positioning): No assistance needed to correctly position infant at breast.  LATCH Score: 9  Interventions    Lactation Tools Discussed/Used     Consult Status Consult Status: PRN    Sherlyn HayJennifer D Jasalyn Frysinger 08/15/2017, 9:10 AM

## 2017-08-21 ENCOUNTER — Other Ambulatory Visit: Payer: Medicaid Other

## 2017-08-21 ENCOUNTER — Ambulatory Visit: Payer: Medicaid Other | Admitting: Hematology

## 2017-09-13 ENCOUNTER — Ambulatory Visit: Payer: Medicaid Other | Admitting: Obstetrics

## 2017-09-14 ENCOUNTER — Ambulatory Visit (INDEPENDENT_AMBULATORY_CARE_PROVIDER_SITE_OTHER): Payer: Medicaid Other | Admitting: Obstetrics & Gynecology

## 2017-09-14 ENCOUNTER — Encounter: Payer: Self-pay | Admitting: Obstetrics & Gynecology

## 2017-09-14 ENCOUNTER — Other Ambulatory Visit: Payer: Self-pay

## 2017-09-14 DIAGNOSIS — G43709 Chronic migraine without aura, not intractable, without status migrainosus: Secondary | ICD-10-CM

## 2017-09-14 DIAGNOSIS — Z30011 Encounter for initial prescription of contraceptive pills: Secondary | ICD-10-CM

## 2017-09-14 DIAGNOSIS — Z1389 Encounter for screening for other disorder: Secondary | ICD-10-CM

## 2017-09-14 MED ORDER — NORGESTREL-ETHINYL ESTRADIOL 0.3-30 MG-MCG PO TABS: 1.0000 | ORAL_TABLET | Freq: Every day | ORAL | 11 refills | Status: DC

## 2017-09-14 NOTE — Progress Notes (Signed)
Subjective:   Wants OCP. Having headaches 9/10 since pregnancy.     [name] [name] is a 26 y.o. female who presents for a postpartum visit. She is 4 weeks postpartum following a spontaneous vaginal delivery. She had IOL due to fetal pericardial effusion.  I have fully reviewed the prenatal and intrapartum course. The delivery was at 36.3 gestational weeks. Outcome: spontaneous vaginal delivery. Anesthesia: epidural. Postpartum course has been UNREMARKABLE. Baby's course has been UNREMARKABLE. Baby is feeding by bottle - Similac Neosure. Bleeding no bleeding. Bowel function is normal. Bladder function is normal. Patient is not sexually active. Contraception method is none. Postpartum depression screening: negative. She has a history of migraines. She has not seen a headache specialist.  The following portions of the patient's history were reviewed and updated as appropriate: allergies, current medications, past family history, past medical history, past social history, past surgical history and problem list.  Review of Systems Pertinent items are noted in HPI.   Objective:    BP 138/86   Pulse (!) 102   Temp 98.4 F (36.9 C)   Ht 5\' 6"  (1.676 m)   Wt 209 lb 4.8 oz (94.9 kg)   Breastfeeding? No   BMI 33.78 kg/m   General:  alert   Breasts:  inspection negative, no nipple discharge or bleeding, no masses or nodularity palpable  Lungs: clear to auscultation bilaterally  Heart:  regular rate and rhythm, S1, S2 normal, no murmur, click, rub or gallop  Abdomen: soft, non-tender; bowel sounds normal; no masses,  no organomegaly   Vulva:  not evaluated  Vagina: not evaluated  Cervix:  not evaluated  Corpus: not examined  Adnexa:  not evaluated  Rectal Exam: Not performed.        Assessment:   Normal postpartum exam. Pap smear not done at today's visit.   Plan:    1. Contraception: OCP (estrogen/progesterone), However, I have strongly rec'd an IUD due to her migraines and history or HTN.  She will start OCPs today, back up method for a month, BP check here in 2 weeks. 2. Migraines- refer to [facility] 3. Follow up in: 2 weeks BP check

## 2017-09-28 ENCOUNTER — Ambulatory Visit: Payer: Medicaid Other | Admitting: Obstetrics & Gynecology

## 2017-10-05 ENCOUNTER — Ambulatory Visit: Payer: Medicaid Other | Admitting: Obstetrics

## 2017-10-09 ENCOUNTER — Ambulatory Visit (INDEPENDENT_AMBULATORY_CARE_PROVIDER_SITE_OTHER): Payer: Medicaid Other | Admitting: Obstetrics

## 2017-10-09 ENCOUNTER — Encounter: Payer: Self-pay | Admitting: Obstetrics

## 2017-10-09 ENCOUNTER — Other Ambulatory Visit: Payer: Self-pay

## 2017-10-09 VITALS — BP 140/91 | HR 96 | Ht 66.0 in | Wt 213.9 lb

## 2017-10-09 DIAGNOSIS — G43709 Chronic migraine without aura, not intractable, without status migrainosus: Secondary | ICD-10-CM | POA: Diagnosis not present

## 2017-10-09 DIAGNOSIS — Z30013 Encounter for initial prescription of injectable contraceptive: Secondary | ICD-10-CM | POA: Diagnosis not present

## 2017-10-09 DIAGNOSIS — I10 Essential (primary) hypertension: Secondary | ICD-10-CM

## 2017-10-09 DIAGNOSIS — Z3009 Encounter for other general counseling and advice on contraception: Secondary | ICD-10-CM | POA: Diagnosis not present

## 2017-10-09 MED ORDER — MEDROXYPROGESTERONE ACETATE 150 MG/ML IM SUSP: 150.0000 mg | INTRAMUSCULAR | 4 refills | Status: DC

## 2017-10-09 MED ORDER — TRIAMTERENE-HCTZ 37.5-25 MG PO CAPS: 1.0000 | ORAL_CAPSULE | Freq: Every day | ORAL | 11 refills | Status: DC

## 2017-10-09 MED ORDER — CARVEDILOL 6.25 MG PO TABS: 6.2500 mg | ORAL_TABLET | Freq: Two times a day (BID) | ORAL | 11 refills | Status: DC

## 2017-10-09 MED ORDER — BUTALBITAL-APAP-CAFFEINE 50-325-40 MG PO TABS: 2.0000 | ORAL_TABLET | Freq: Four times a day (QID) | ORAL | 2 refills | Status: DC | PRN

## 2017-10-09 NOTE — Progress Notes (Signed)
Presents for BP check and FU. Migraine headaches 10/10 everyday at base of neck or left side of face wih [place].

## 2017-10-09 NOTE — Progress Notes (Signed)
Patient ID: [name] [name], female   DOB: 11-07-1990, 27 y.o.   MRN: [id number]  Chief Complaint  Patient presents with  . Hypertension    HPI [name] [name] is a 27 y.o. female.  Presents for BP follow up 8 weeks postpartum.  Has history of migraines.  Taking OCP's for contraception. HPI  Past Medical History:  Diagnosis Date  . Anemia   . Anxiety   . Chlamydia 10-2011  . Cholecystitis   . Gestational hypertension   . Headache(784.0)   . Heart palpitations   . Hypertension     Past Surgical History:  Procedure Laterality Date  . ORIF ANKLE FRACTURE Left 02/12/2016   Procedure: OPEN REDUCTION INTERNAL FIXATION (ORIF) ANKLE FRACTURE;  Surgeon: Myrene Galas, MD;  Location: [facility];  Service: Orthopedics;  Laterality: Left;    Family History  Problem Relation Age of Onset  . Diabetes Maternal Grandmother   . Hypertension Maternal Grandmother   . Cancer Maternal Grandmother   . Hypertension Mother   . Anesthesia problems Neg Hx   . Other Neg Hx     Social History Social History   Tobacco Use  . Smoking status: Current Every Day Smoker    Packs/day: 0.25    Years: 3.00    Pack years: 0.75    Types: Cigarettes  . Smokeless tobacco: Never Used  Substance Use Topics  . Alcohol use: No    Comment: socially  . Drug use: No    Allergies  Allergen Reactions  . Metronidazole Nausea And Vomiting    Current Outpatient Medications  Medication Sig Dispense Refill  . butalbital-acetaminophen-caffeine (ESGIC) 50-325-40 MG tablet Take 2 tablets by mouth every 6 (six) hours as needed for headache. 30 tablet 2  . carvedilol (COREG) 6.25 MG tablet Take 1 tablet (6.25 mg total) by mouth 2 (two) times daily with a meal. 60 tablet 11  . medroxyPROGESTERone (DEPO-PROVERA) 150 MG/ML injection Inject 1 mL (150 mg total) into the muscle every 3 (three) months. 1 mL 4  . norgestrel-ethinyl estradiol (LO/OVRAL,CRYSELLE) 0.3-30 MG-MCG tablet Take 1 tablet by mouth daily. 1 Package 11  .  Prenatal-DSS-FeCb-FeGl-FA (CITRANATAL BLOOM) 90-1 MG TABS Take 1 tablet by mouth daily. (Patient not taking: Reported on 09/14/2017) 30 tablet 12  . triamterene-hydrochlorothiazide (DYAZIDE) 37.5-25 MG capsule Take 1 each (1 capsule total) by mouth daily. 30 capsule 11   No current facility-administered medications for this visit.     Review of Systems Review of Systems Constitutional: negative for fatigue and weight loss Respiratory: negative for cough and wheezing Cardiovascular: negative for chest pain, fatigue and palpitations Gastrointestinal: negative for abdominal pain and change in bowel habits Genitourinary:negative Integument/breast: negative for nipple discharge Musculoskeletal:negative for myalgias Neurological: positive for migraine HA's Behavioral/Psych: negative Endocrine: negative for temperature intolerance      Blood pressure (!) 140/91, pulse 96, height 5\' 6"  (1.676 m), weight 213 lb 14.4 oz (97 kg), not currently breastfeeding.  Physical Exam Physical Exam:  Deferred  >50% of 10 min visit spent on counseling and coordination of care.   Data Reviewed Vitals  Assessment     1. HTN (hypertension), benign Rx: - triamterene-hydrochlorothiazide (DYAZIDE) 37.5-25 MG capsule; Take 1 each (1 capsule total) by mouth daily.  Dispense: 30 capsule; Refill: 11 - carvedilol (COREG) 6.25 MG tablet; Take 1 tablet (6.25 mg total) by mouth 2 (two) times daily with a meal.  Dispense: 60 tablet; Refill: 11 - Ambulatory referral to Internal Medicine  2. Encounter for other general counseling  and advice on contraception - stop OCP's  3. Encounter for initial prescription of injectable contraceptive Rx: - medroxyPROGESTERone (DEPO-PROVERA) 150 MG/ML injection; Inject 1 mL (150 mg total) into the muscle every 3 (three) months.  Dispense: 1 mL; Refill: 4  4. Chronic migraine without aura without status migrainosus, not intractable Rx - butalbital-acetaminophen-caffeine (ESGIC)  50-325-40 MG tablet; Take 2 tablets by mouth every 6 (six) hours as needed for headache.  Dispense: 30 tablet; Refill: 2 - Ambulatory referral to Internal Medicine    Plan    Follow up in 3 months  Orders Placed This Encounter  Procedures  . Ambulatory referral to Internal Medicine    Referral Priority:   Routine    Referral Type:   Consultation    Referral Reason:   Specialty Services Required    Requested Specialty:   Internal Medicine    Number of Visits Requested:   1   Meds ordered this encounter  Medications  . triamterene-hydrochlorothiazide (DYAZIDE) 37.5-25 MG capsule    Sig: Take 1 each (1 capsule total) by mouth daily.    Dispense:  30 capsule    Refill:  11  . carvedilol (COREG) 6.25 MG tablet    Sig: Take 1 tablet (6.25 mg total) by mouth 2 (two) times daily with a meal.    Dispense:  60 tablet    Refill:  11  . medroxyPROGESTERone (DEPO-PROVERA) 150 MG/ML injection    Sig: Inject 1 mL (150 mg total) into the muscle every 3 (three) months.    Dispense:  1 mL    Refill:  4  . butalbital-acetaminophen-caffeine (ESGIC) 50-325-40 MG tablet    Sig: Take 2 tablets by mouth every 6 (six) hours as needed for headache.    Dispense:  30 tablet    Refill:  2    Brock BadHARLES A. HARPER MD

## 2017-10-11 ENCOUNTER — Ambulatory Visit: Payer: Medicaid Other

## 2017-10-12 ENCOUNTER — Ambulatory Visit: Payer: Medicaid Other

## 2017-10-16 ENCOUNTER — Ambulatory Visit (INDEPENDENT_AMBULATORY_CARE_PROVIDER_SITE_OTHER): Payer: Medicaid Other

## 2017-10-16 VITALS — BP 150/106 | HR 84

## 2017-10-16 DIAGNOSIS — Z3042 Encounter for surveillance of injectable contraceptive: Secondary | ICD-10-CM

## 2017-10-16 MED ORDER — MEDROXYPROGESTERONE ACETATE 150 MG/ML IM SUSP
150.0000 mg | Freq: Once | INTRAMUSCULAR | Status: AC
  Administered 2017-10-16: 150 mg via INTRAMUSCULAR

## 2017-10-16 NOTE — Progress Notes (Signed)
Pt here for depo shot. Inj given in left deltoid. Pt tolerated well. Next depo due 4/1 through 4/15.

## 2017-10-16 NOTE — Progress Notes (Signed)
Agree with nursing staff's documentation of this patient's clinic encounter.  Catalina AntiguaPeggy Macsen Nuttall, MD 10/16/2017 8:56 AM

## 2017-11-01 IMAGING — CT CT ABD-PELV W/ CM
2 of 4 series · 16 of 46 positions shown, 18 images · IV contrast (Omni 300)
Comparison: Ultrasound abdomen 12/07/2015

CLINICAL DATA: Right-sided abdominal pain since 9 p.m.. Diagnosed
with gallstones a few months ago. Right lower quadrant and right
upper quadrant pain. Nausea and vomiting.

EXAM:
CT ABDOMEN AND PELVIS WITH CONTRAST
TECHNIQUE: Multidetector CT imaging of the abdomen and pelvis was performed
using the standard protocol following bolus administration of
intravenous contrast.
CONTRAST:  100mL OMNIPAQUE IOHEXOL 300 MG/ML  SOLN

[Series 2: a/p w/ 5mm · axial · 0.78mm/px · z∈[-514,-78]mm · 13 of 95 slices shown, 15 images]
[im 4/95  soft-tissue]
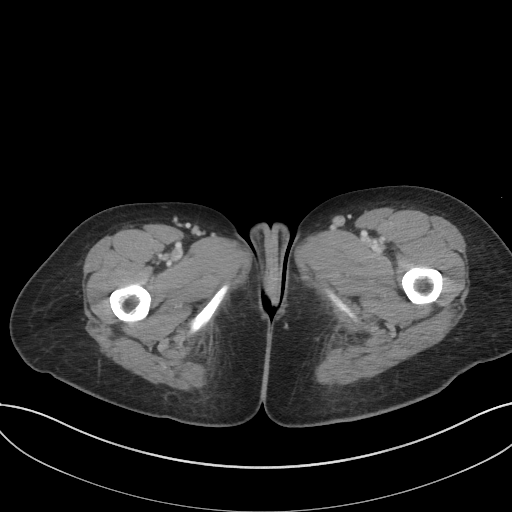
[im 4/95  bone]
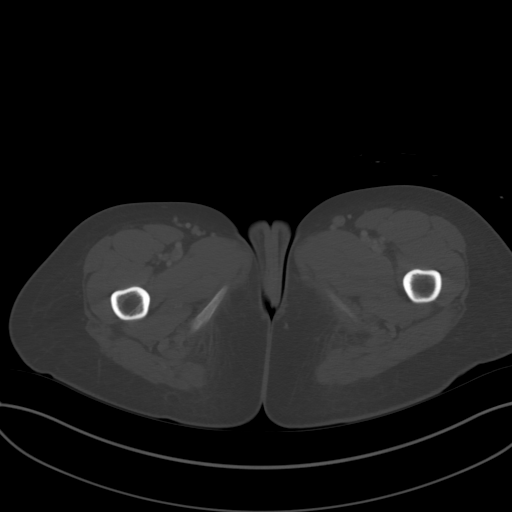
[im 12/95  soft-tissue]
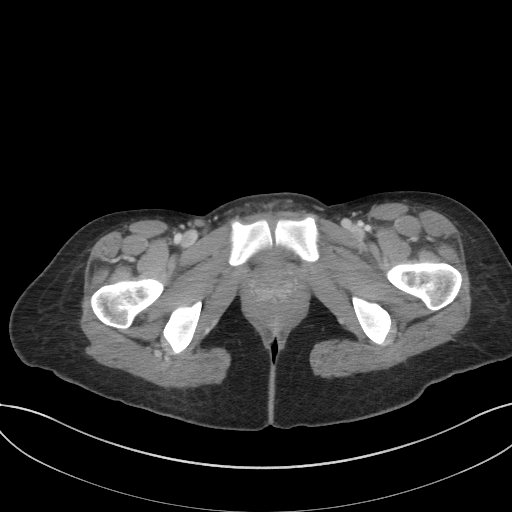
[im 19/95  soft-tissue]
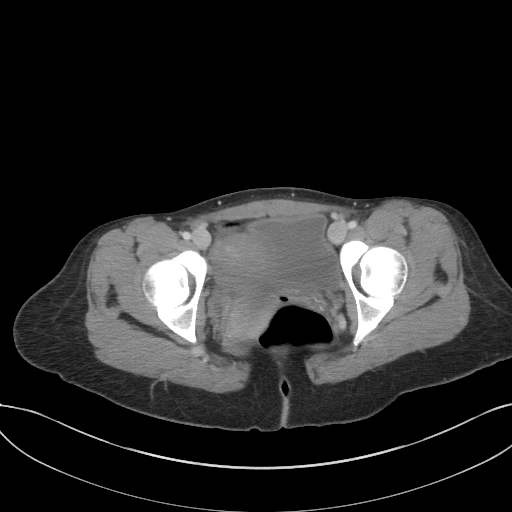
[im 27/95  soft-tissue]
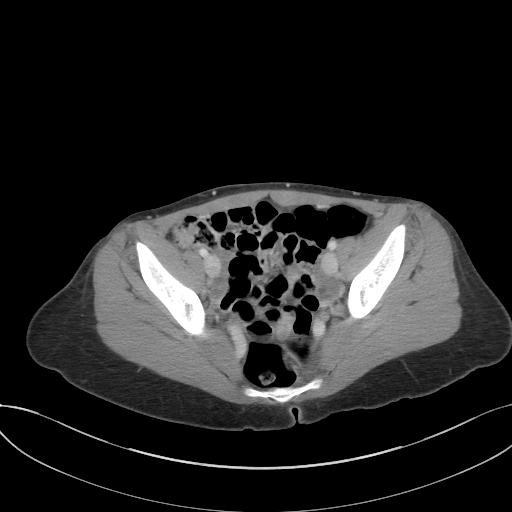
[im 34/95  soft-tissue]
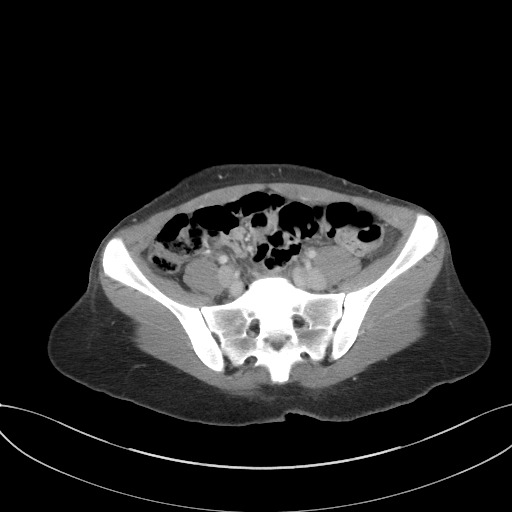
[im 42/95  soft-tissue]
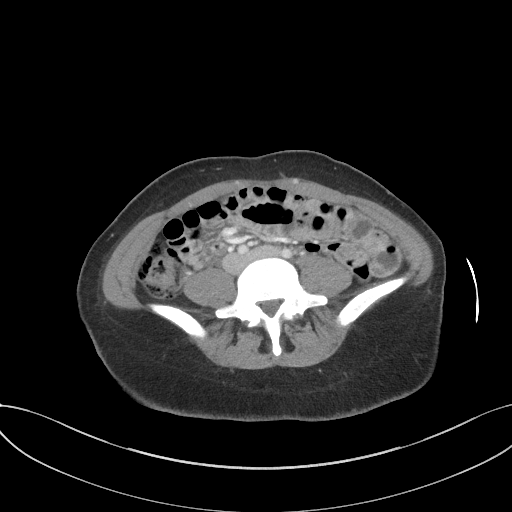
[im 49/95  soft-tissue]
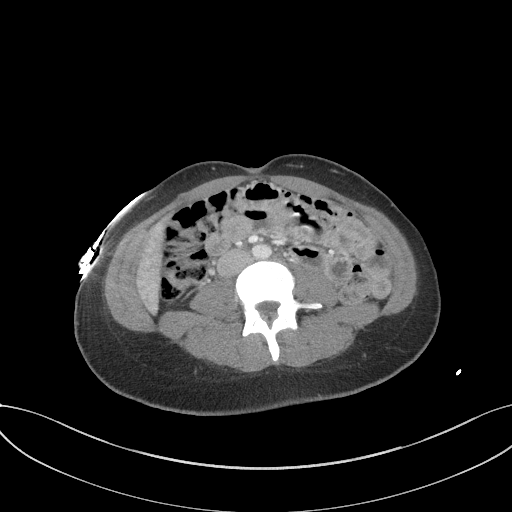
[im 53/95  soft-tissue]
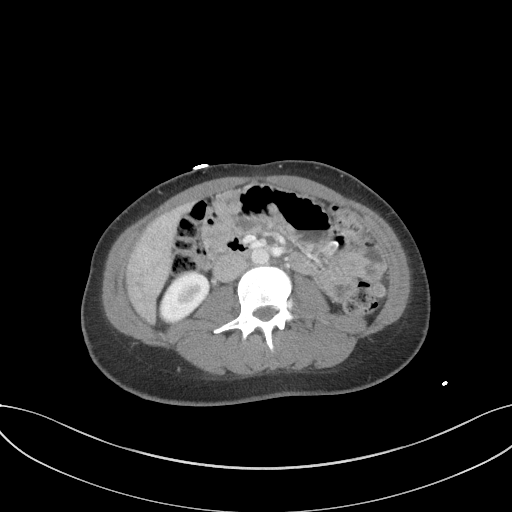
[im 61/95  soft-tissue]
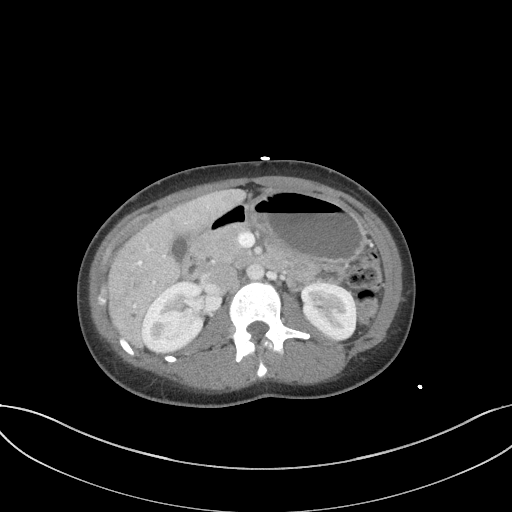
[im 61/95  bone]
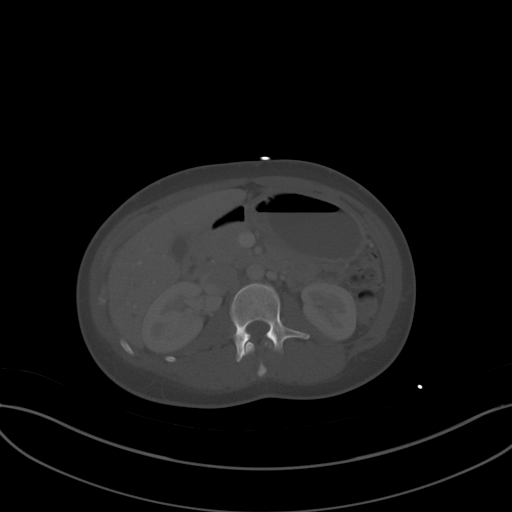
[im 68/95  soft-tissue]
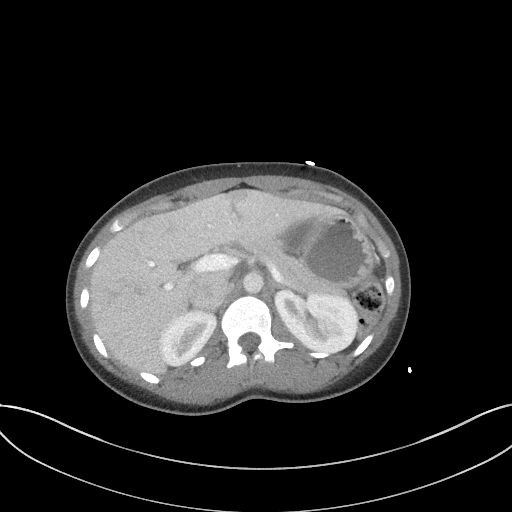
[im 76/95  soft-tissue]
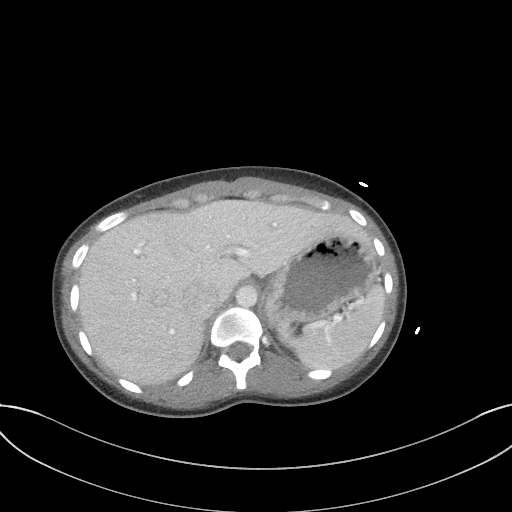
[im 83/95  soft-tissue]
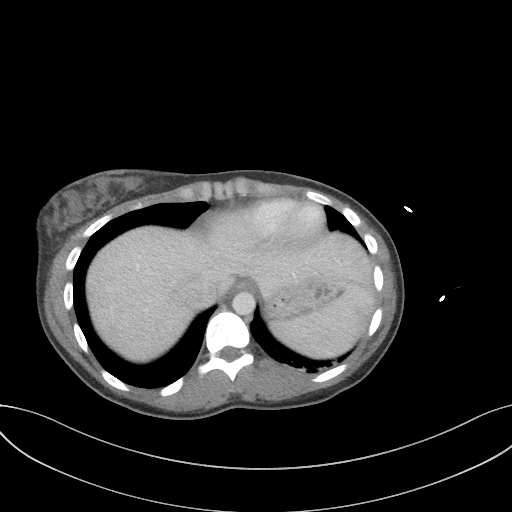
[im 91/95  soft-tissue]
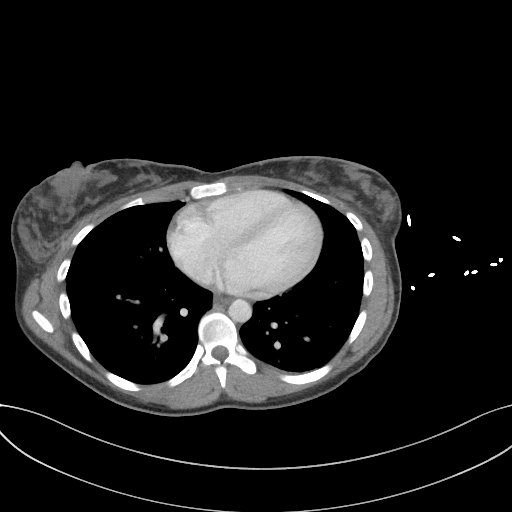

[Series 5: a/p w/ cor · coronal · 0.68mm/px · 3 of 93 slices shown]
[im 31/93  soft-tissue]
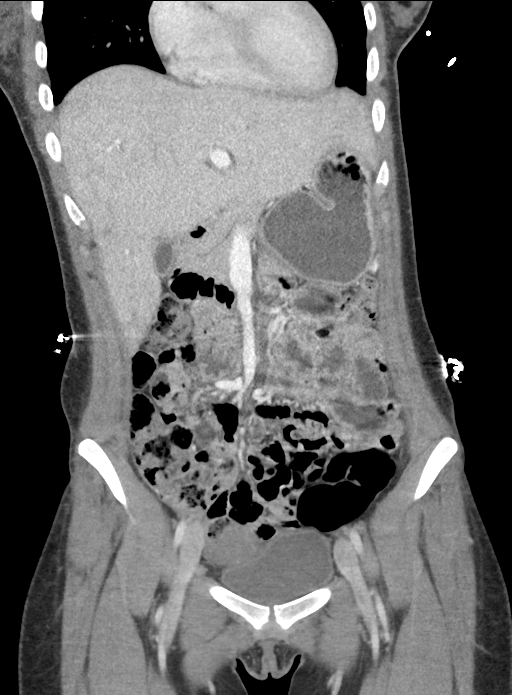
[im 41/93  soft-tissue]
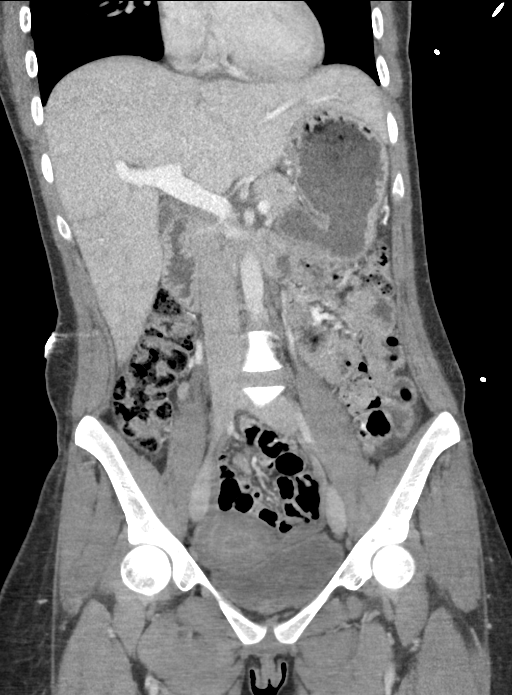
[im 52/93  soft-tissue]
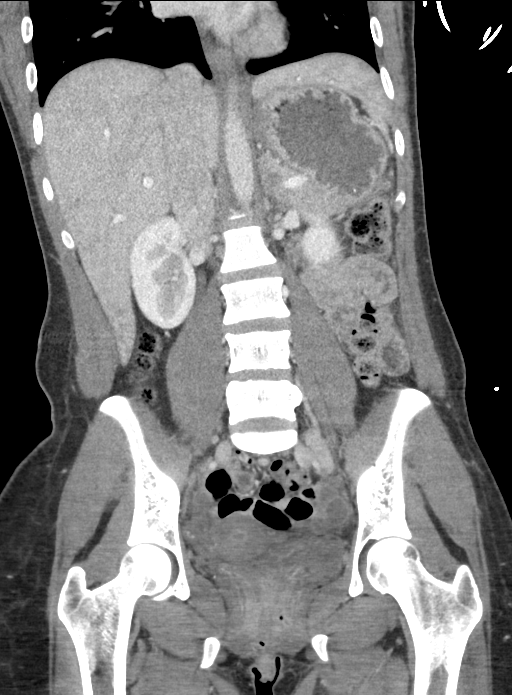

[16 of 46 positions shown; findings below may reference images not displayed]

FINDINGS: Atelectasis in the lung bases.

Gallbladder is contracted. Stones seen at ultrasound are not visible
on CT. Mild heterogeneous parenchymal pattern to the liver probably
due to early phase contrast enhancement. No discrete focal liver
lesion. The pancreas, spleen, adrenal glands, kidneys, abdominal
aorta, inferior vena cava, and retroperitoneal lymph nodes are
unremarkable.

Stomach, small bowel, and colon are not abnormally distended.
Scattered stool in the colon. No free air or free fluid in the
abdomen.

Pelvis: Uterus and ovaries are not enlarged. No free or loculated
pelvic fluid collections. No pelvic mass or lymphadenopathy.
Appendix is normal. Bladder wall is not thickened. Mildly enlarged
lymph nodes in the groin regions bilaterally, likely reactive. No
destructive bone lesions.
IMPRESSION: No acute process demonstrated. No evidence of bowel obstruction or
inflammation.

## 2017-11-17 ENCOUNTER — Ambulatory Visit (INDEPENDENT_AMBULATORY_CARE_PROVIDER_SITE_OTHER): Payer: Medicaid Other | Admitting: Physician Assistant

## 2017-11-17 ENCOUNTER — Encounter: Payer: Self-pay | Admitting: Physician Assistant

## 2017-11-17 VITALS — BP 133/83 | HR 72

## 2017-11-17 DIAGNOSIS — M62838 Other muscle spasm: Secondary | ICD-10-CM

## 2017-11-17 DIAGNOSIS — G43709 Chronic migraine without aura, not intractable, without status migrainosus: Secondary | ICD-10-CM | POA: Diagnosis not present

## 2017-11-17 DIAGNOSIS — IMO0002 Reserved for concepts with insufficient information to code with codable children: Secondary | ICD-10-CM

## 2017-11-17 DIAGNOSIS — G43009 Migraine without aura, not intractable, without status migrainosus: Secondary | ICD-10-CM

## 2017-11-17 MED ORDER — SUMATRIPTAN SUCCINATE 100 MG PO TABS: 100.0000 mg | ORAL_TABLET | Freq: Once | ORAL | 3 refills | Status: DC | PRN

## 2017-11-17 MED ORDER — BACLOFEN 10 MG PO TABS: 10.0000 mg | ORAL_TABLET | Freq: Three times a day (TID) | ORAL | 1 refills | Status: DC

## 2017-11-17 MED ORDER — TOPIRAMATE 25 MG PO TABS: 75.0000 mg | ORAL_TABLET | Freq: Every day | ORAL | 2 refills | Status: DC

## 2017-11-17 NOTE — Progress Notes (Signed)
History:  [name] [name] is a 27 y.o. [phone] who presents to clinic today for new evaluation of headaches.  She has been getting migraines since [age] years old and she had nosebleeds with them.  She has a very strong family history.  It lasts over 24 hours and is often severe.   It is located in the front, sometimes right, sometimes left.  Occasionally other places.   There is throbbing, worse with movement.  There is photophobia and phonophobia.  When the pain is worst, she has blurry vision.  On rare occasions, she has had nausea and vomiting.   She has tried tylenol, fioricet, ibuprofen.  She has not tried any medications for prevention.  She is not exercising.  She has 4 children, ages 1 and under.  Her youngest, 3 months does sleep from 10p-6a.  She has a sister that lives with her and helps with the children.    HIT6:72 Number of days in the last 4 weeks with:  Severe headache: 15 Moderate headache: 3 Mild headache: 3  No headache: 7   Past Medical History:  Diagnosis Date  . Anemia   . Anxiety   . Chlamydia 10-2011  . Cholecystitis   . Gestational hypertension   . Headache(784.0)   . Heart palpitations   . Hypertension     Social History   Socioeconomic History  . Marital status: Single    Spouse name: Not on file  . Number of children: Not on file  . Years of education: Not on file  . Highest education level: Not on file  Social Needs  . Financial resource strain: Not on file  . Food insecurity - worry: Not on file  . Food insecurity - inability: Not on file  . Transportation needs - medical: Not on file  . Transportation needs - non-medical: Not on file  Occupational History  . Not on file  Tobacco Use  . Smoking status: Current Every Day Smoker    Packs/day: 0.25    Years: 3.00    Pack years: 0.75    Types: Cigarettes  . Smokeless tobacco: Never Used  Substance and Sexual Activity  . Alcohol use: No    Comment: socially  . Drug use: No  . Sexual activity: Yes     Birth control/protection: None  Other Topics Concern  . Not on file  Social History Narrative  . Not on file    Family History  Problem Relation Age of Onset  . Diabetes Maternal Grandmother   . Hypertension Maternal Grandmother   . Cancer Maternal Grandmother   . Hypertension Mother   . Anesthesia problems Neg Hx   . Other Neg Hx     Allergies  Allergen Reactions  . Metronidazole Nausea And Vomiting    Current Outpatient Medications on File Prior to Visit  Medication Sig Dispense Refill  . butalbital-acetaminophen-caffeine (ESGIC) 50-325-40 MG tablet Take 2 tablets by mouth every 6 (six) hours as needed for headache. 30 tablet 2  . carvedilol (COREG) 6.25 MG tablet Take 1 tablet (6.25 mg total) by mouth 2 (two) times daily with a meal. 60 tablet 11  . medroxyPROGESTERone (DEPO-PROVERA) 150 MG/ML injection Inject 1 mL (150 mg total) into the muscle every 3 (three) months. 1 mL 4  . norgestrel-ethinyl estradiol (LO/OVRAL,CRYSELLE) 0.3-30 MG-MCG tablet Take 1 tablet by mouth daily. (Patient not taking: Reported on 11/17/2017) 1 Package 11  . Prenatal-DSS-FeCb-FeGl-FA (CITRANATAL BLOOM) 90-1 MG TABS Take 1 tablet by mouth daily. (Patient  not taking: Reported on 09/14/2017) 30 tablet 12  . triamterene-hydrochlorothiazide (DYAZIDE) 37.5-25 MG capsule Take 1 each (1 capsule total) by mouth daily. (Patient not taking: Reported on 11/17/2017) 30 capsule 11   No current facility-administered medications on file prior to visit.      Review of Systems:  All pertinent positive/negative included in HPI, all other review of systems are negative   Objective:  Physical Exam BP 133/83   Pulse 72  CONSTITUTIONAL: Well-developed, well-nourished female in no acute distress.  EYES: EOM intact ENT: Normocephalic CARDIOVASCULAR: Regular rate  RESPIRATORY: Normal rate.  MUSCULOSKELETAL: Normal ROM,  muscle spasm noted SKIN: Warm, dry without erythema  NEUROLOGICAL: Alert, oriented, CN  II-XII grossly intact, Appropriate balance PSYCH: Normal behavior, mood   Assessment & Plan:  Assessment: 1. Migraine without aura and without status migrainosus, not intractable   2. Chronic migraine   3. Muscle spasm       Plan: Topamax for migraine prevention - begin with 25mg  nightly and increase each week up to a max dose of 75mg  nightly.   Add B-6 if numbness/tingling.   For acute migraine - use sumatriptan.  May repeat after 2 hours.  Max of 2/24 hours and 2 days per week.   Encouraged to add ibuprofen OR aleve to enhance sumatriptan.   Baclofen - for muscle spasm or mild HA.   Exercise encouraged.  Weight loss encouraged.   Encouraged to cut down on smoking - currently 5-7/day. Follow-up in 3 months or sooner PRN  Bertram Denvereague Clark, Karen E, PA-C 11/17/2017 8:57 AM

## 2017-11-17 NOTE — Patient Instructions (Signed)

## 2017-11-26 IMAGING — US US OB COMP LESS 14 WK
1 series · 15 of 28 positions shown · non-contrast
Comparison: None.

CLINICAL DATA: Acute abdominal pain, first trimester of pregnancy.

EXAM:
OBSTETRIC <14 WK US AND TRANSVAGINAL OB US
TECHNIQUE: Both transabdominal and transvaginal ultrasound examinations were
performed for complete evaluation of the gestation as well as the
maternal uterus, adnexal regions, and pelvic cul-de-sac.
Transvaginal technique was performed to assess early pregnancy.

[Series 1: us ob comp less 14 wk · 15 of 80 slices shown]
[im 1/80]
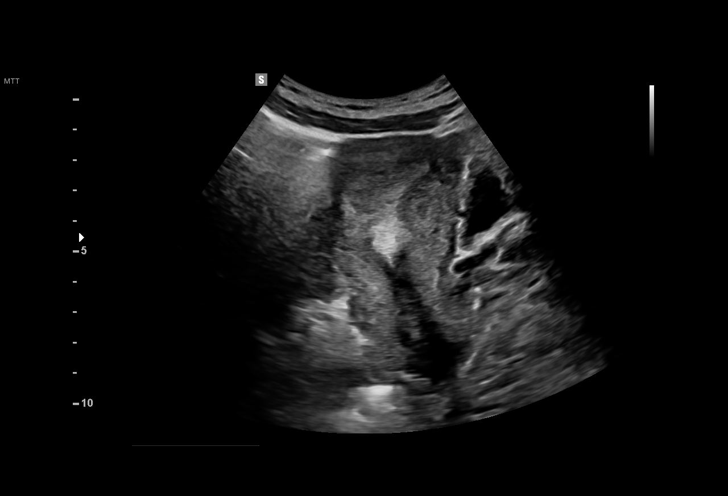
[im 6/80]
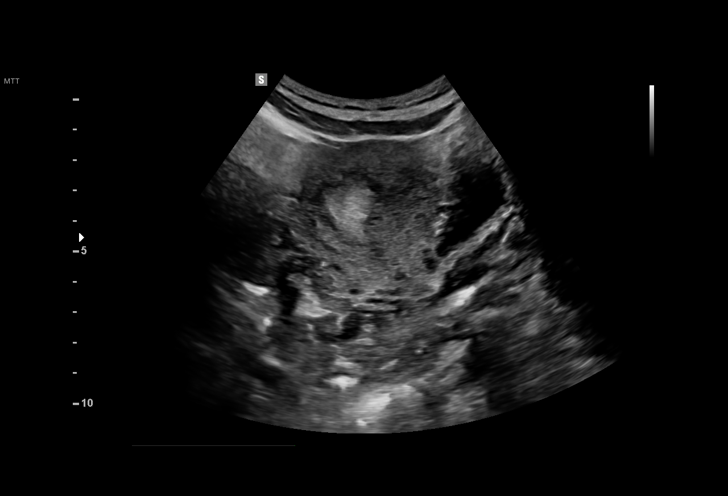
[im 12/80]
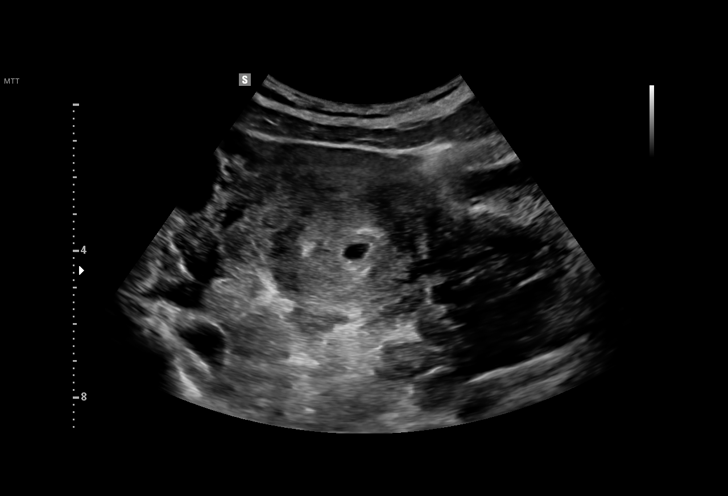
[im 18/80]
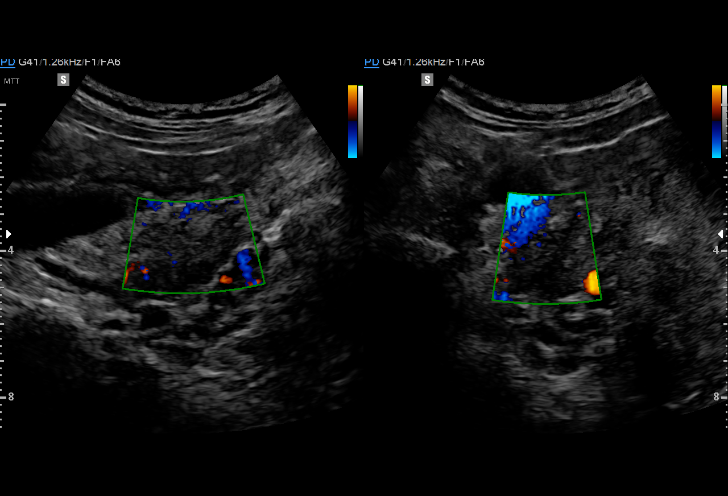
[im 24/80]
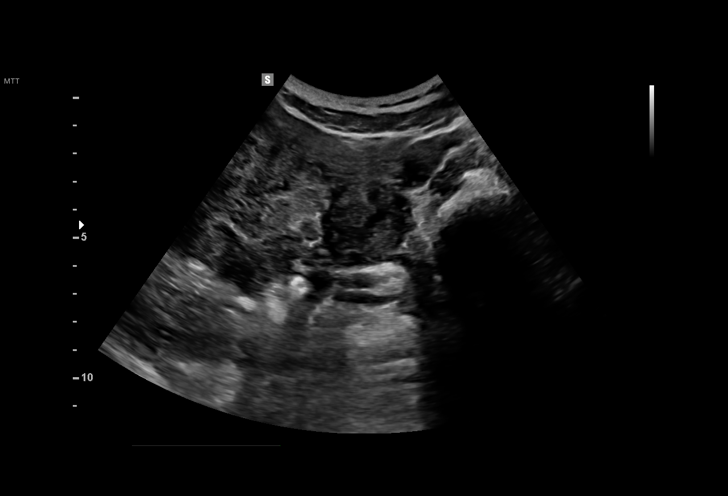
[im 30/80]
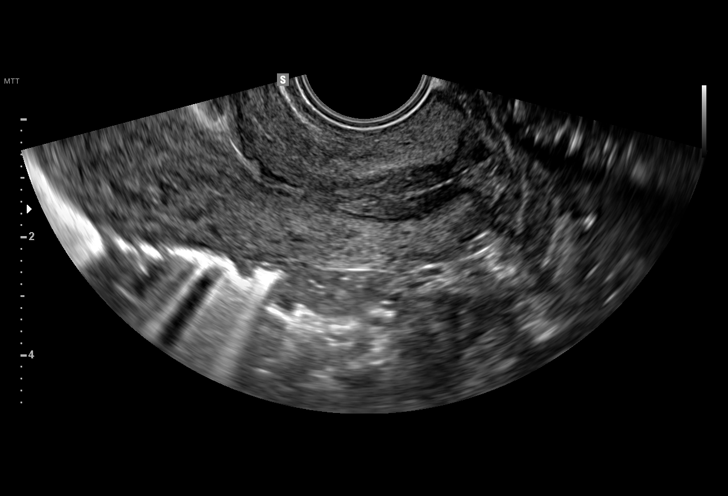
[im 36/80]
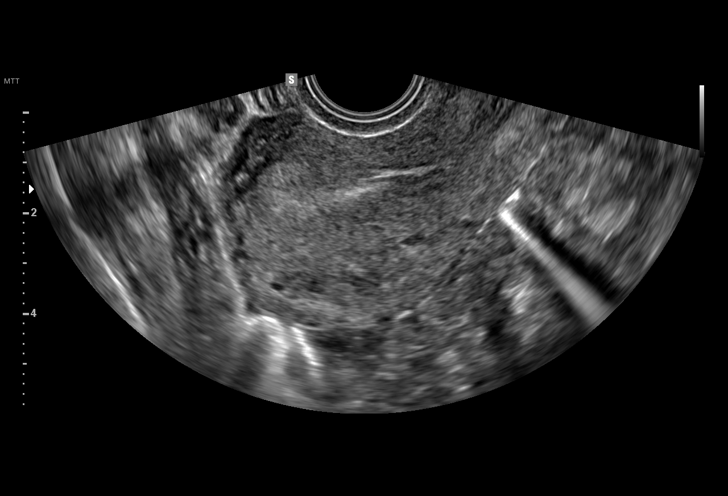
[im 41/80]
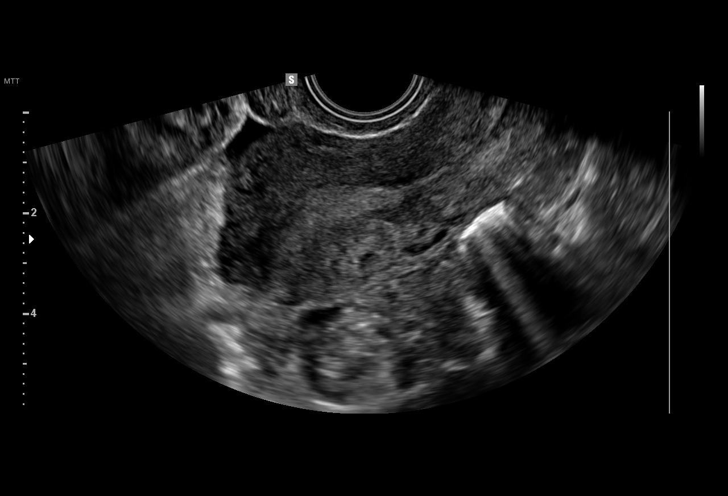
[im 44/80]
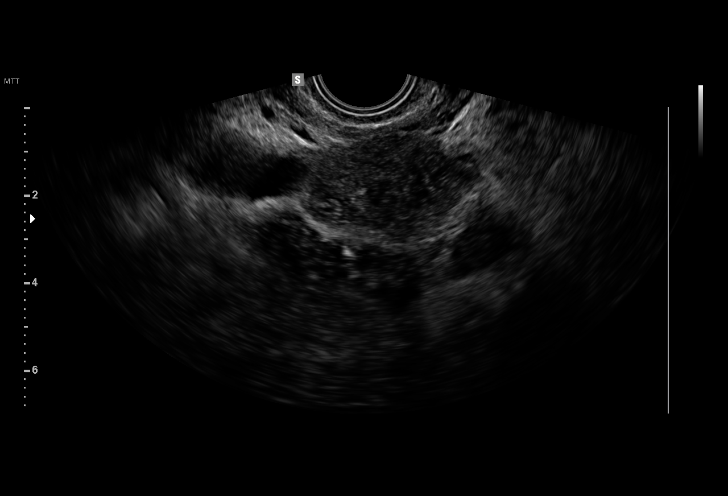
[im 50/80]
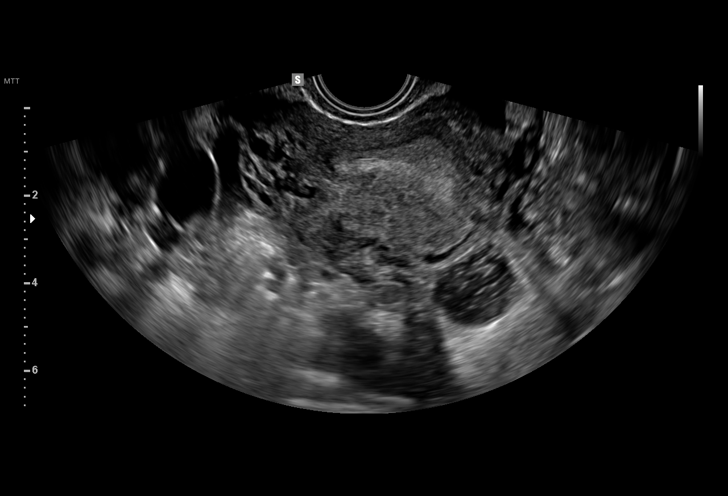
[im 56/80]
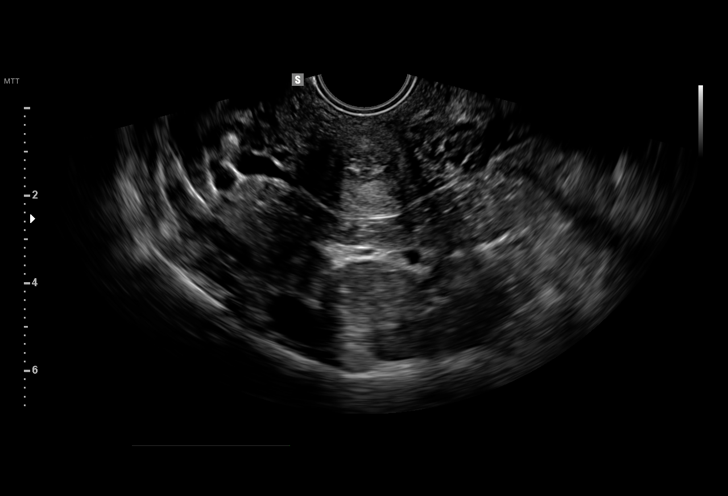
[im 62/80]
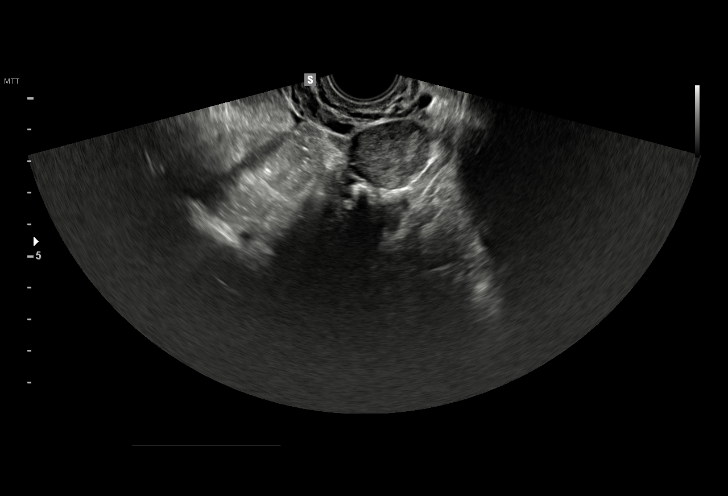
[im 68/80]
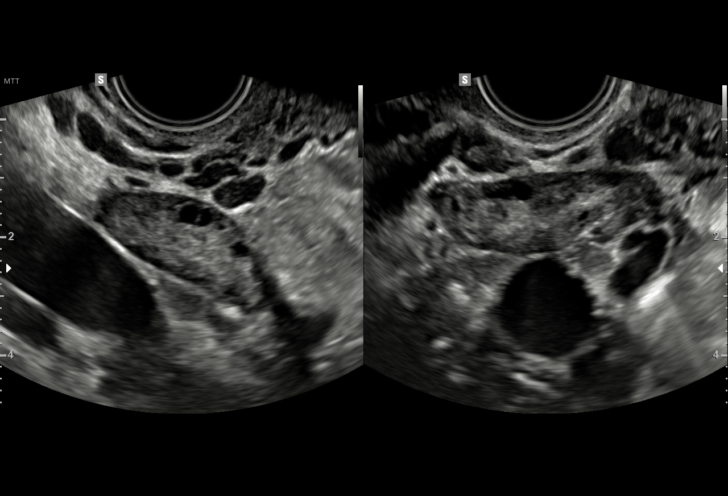
[im 74/80]
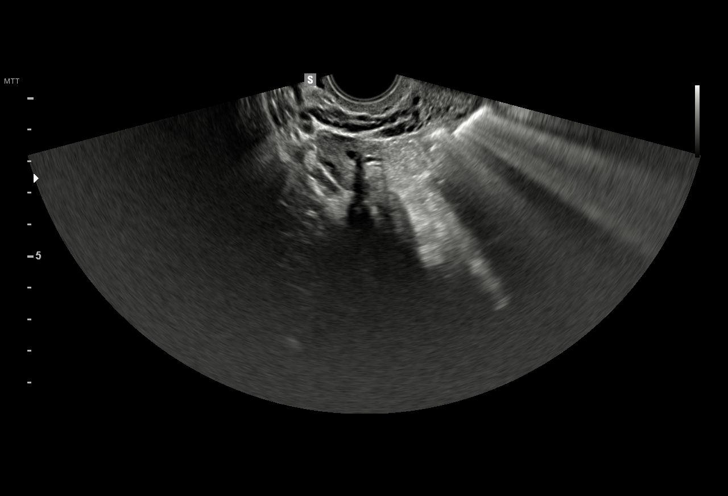
[im 80/80]
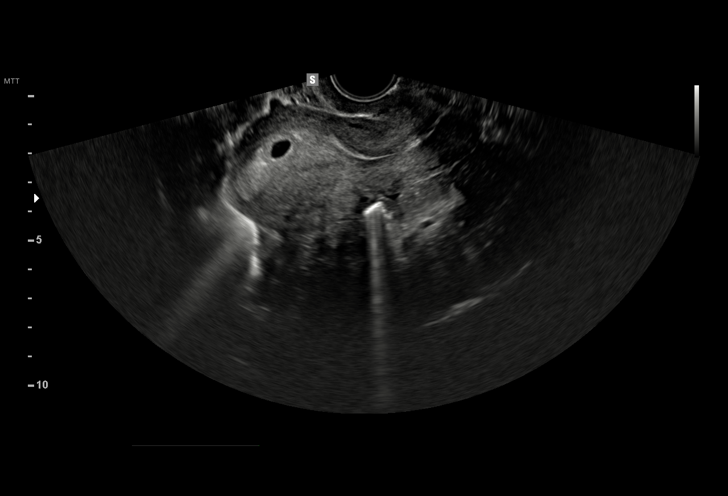

[15 of 28 positions shown; findings below may reference images not displayed]

FINDINGS: Intrauterine gestational sac: Visualized/normal in shape.

Yolk sac:  Visualized.

Embryo:  Not visualized.

Cardiac Activity: Not visualized.

MSD: 7  mm   5 w   0  d

Subchorionic hemorrhage:  None visualized.

Maternal uterus/adnexae: Probable corpus luteum cyst seen in left
ovary. Normal right ovary. Small amount of free fluid is noted which
most likely is physiologic.
IMPRESSION: Probable early intrauterine gestational sac with yolk sac, but no
fetal pole or cardiac activity yet visualized. Recommend follow-up
quantitative B-HCG levels and follow-up US in 14 days to confirm and
assess viability. This recommendation follows SRU consensus
guidelines: Diagnostic Criteria for Nonviable Pregnancy Early in the
First Trimester. N Engl J Med 0534; [DATE].

## 2017-11-28 IMAGING — US US ABDOMEN LIMITED
1 series · 14 of 25 positions shown · non-contrast
Comparison: Right upper quadrant ultrasound 08/19/2015

CLINICAL DATA: Right upper quadrant pain since yesterday, known
gallstones.

EXAM:
US ABDOMEN LIMITED - RIGHT UPPER QUADRANT

[Series 1: us abdomen limited · 0.19mm/px · 14 of 40 slices shown]
[im 1/40]
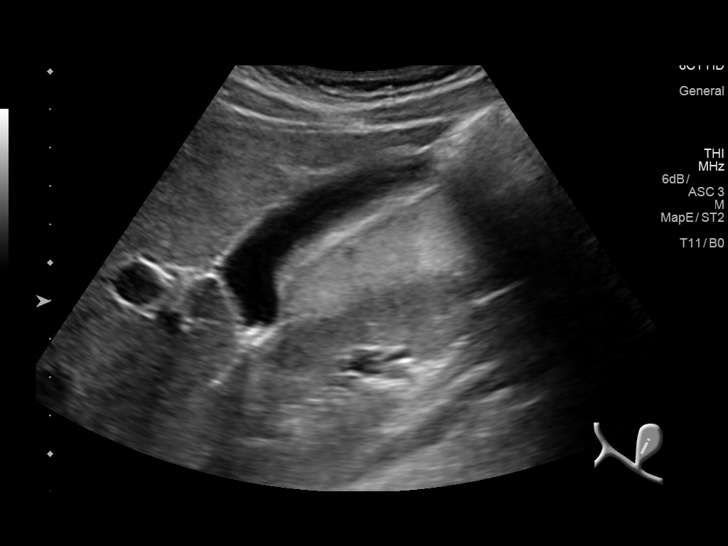
[im 4/40]
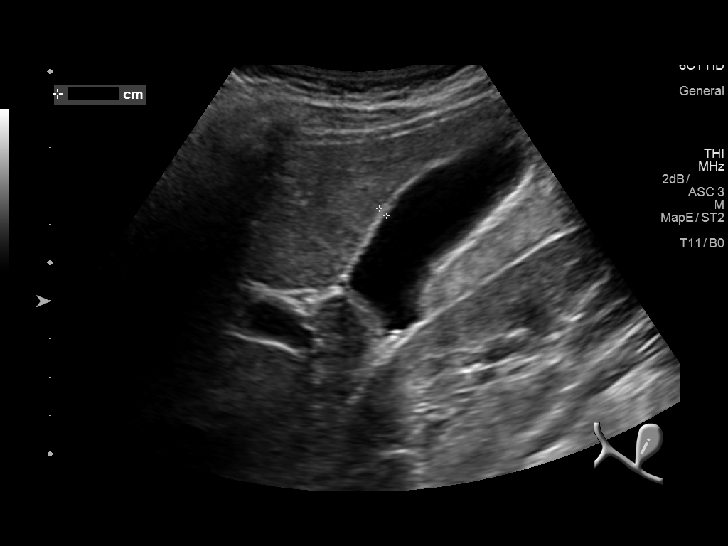
[im 7/40]
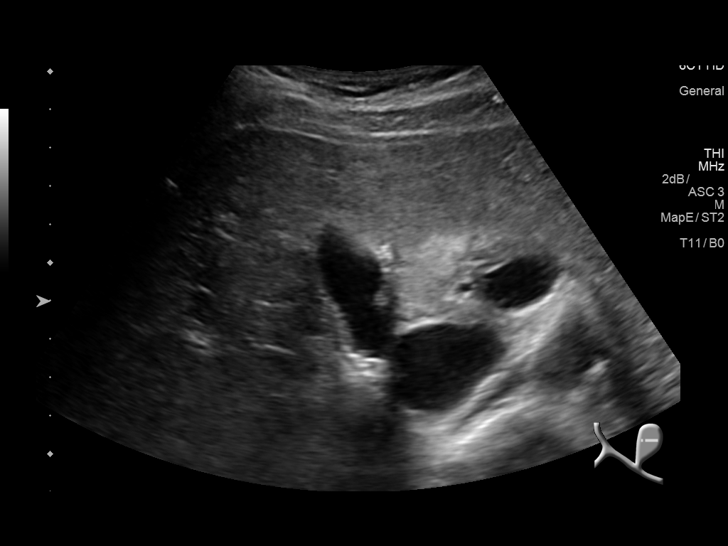
[im 10/40]
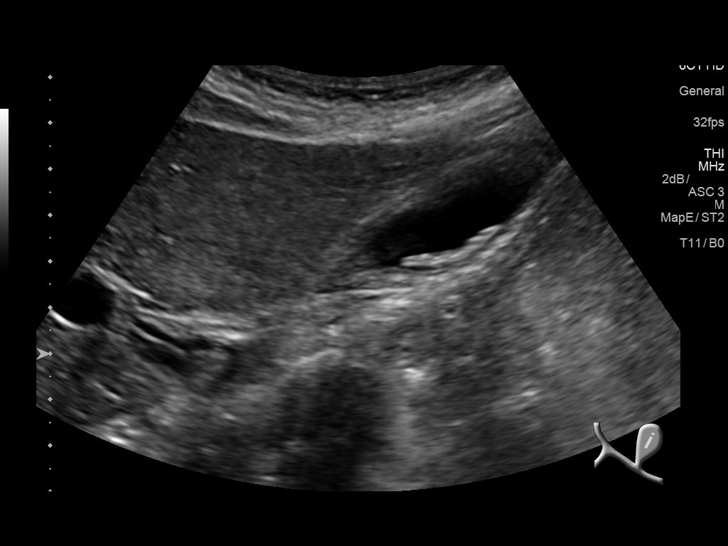
[im 14/40]
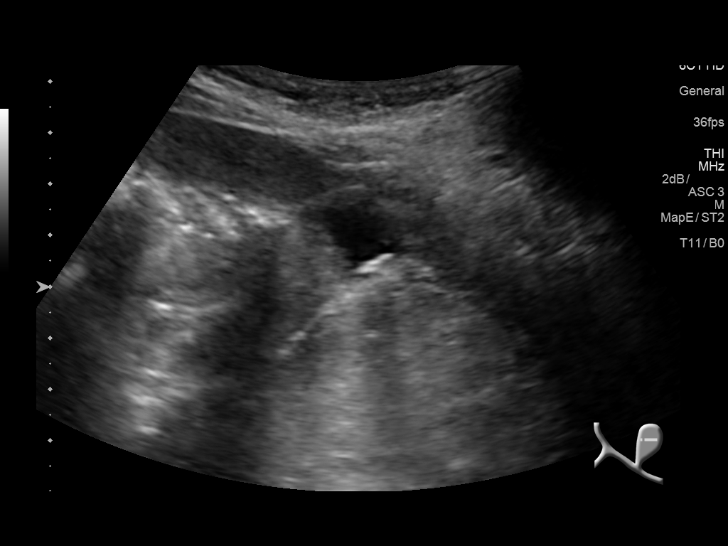
[im 15/40]
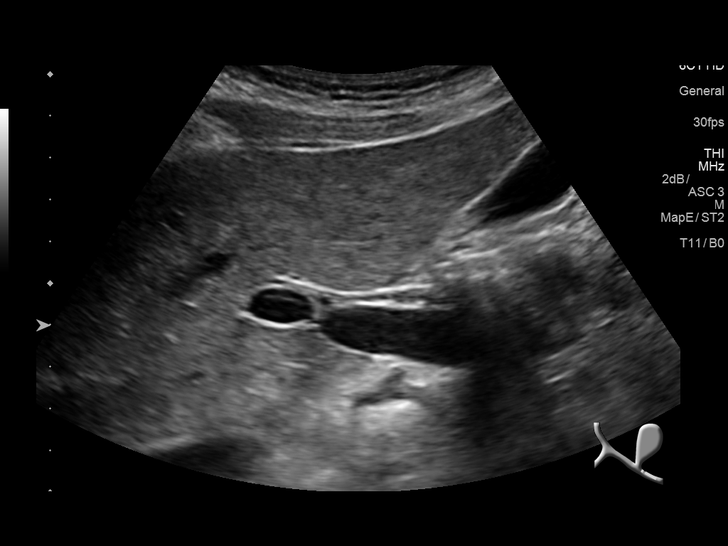
[im 18/40]
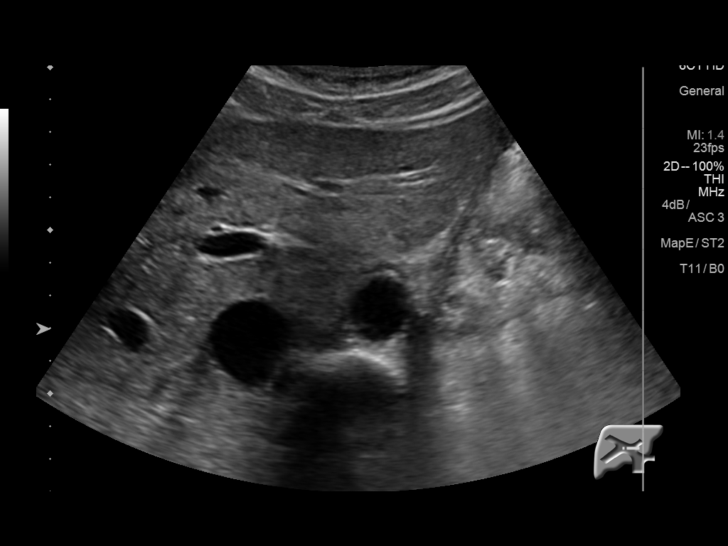
[im 22/40]
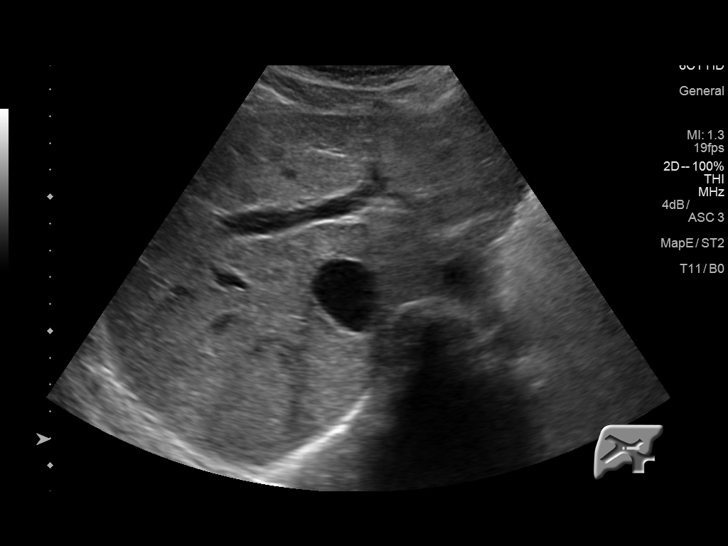
[im 25/40]
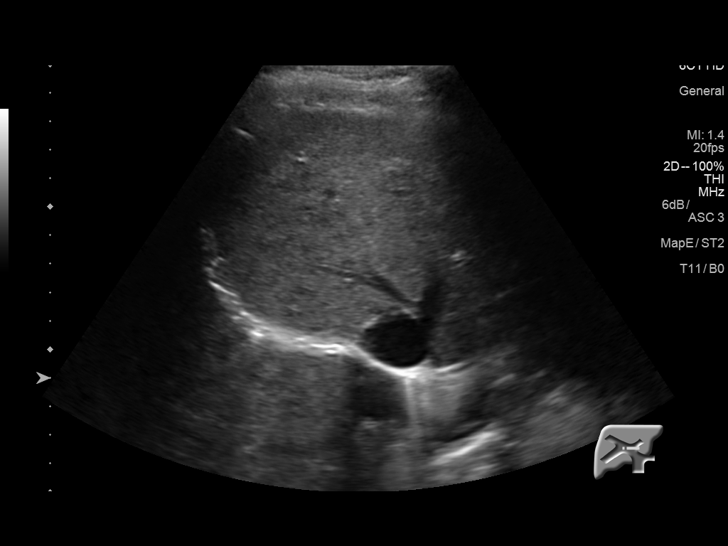
[im 27/40]
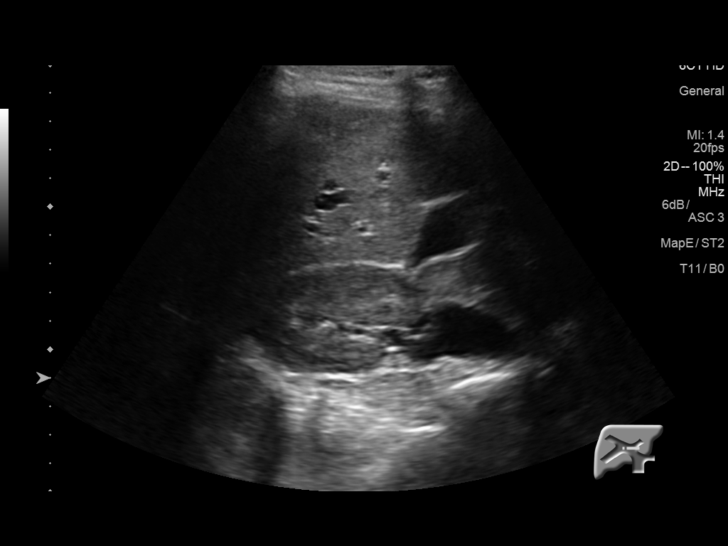
[im 30/40]
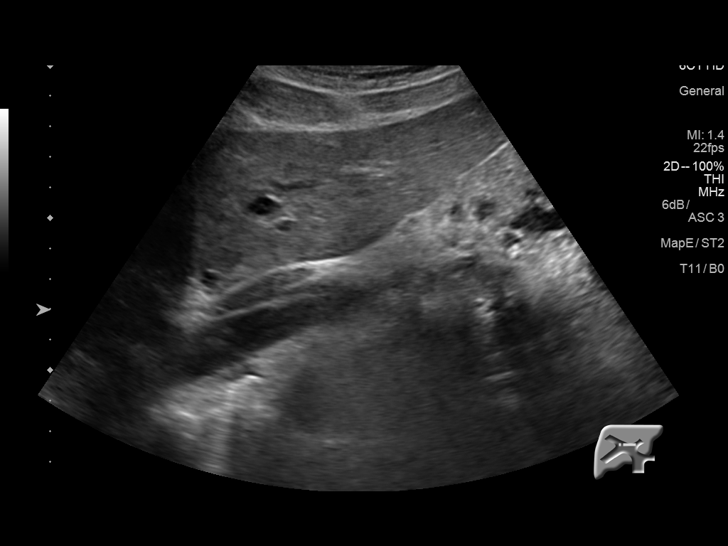
[im 33/40]
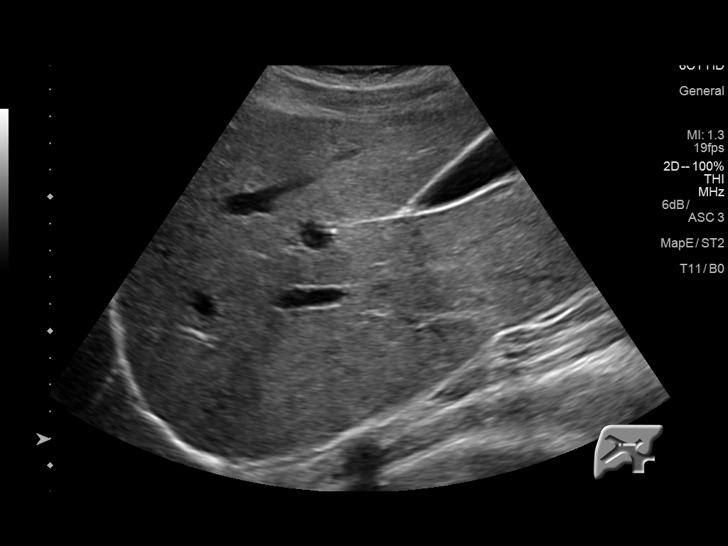
[im 36/40]
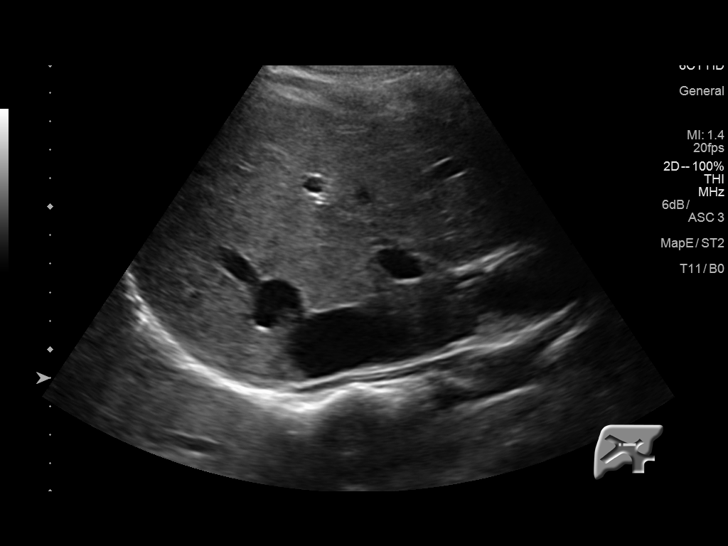
[im 40/40]
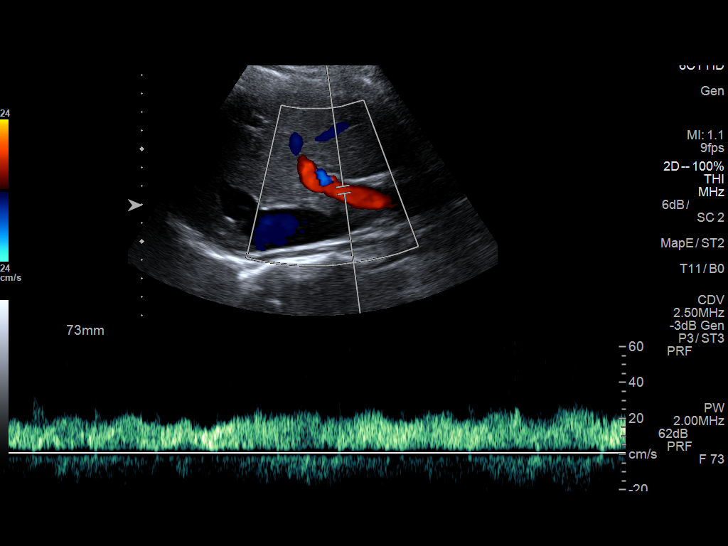

[14 of 25 positions shown; findings below may reference images not displayed]

FINDINGS: Gallbladder:

Physiologically distended. Multiple gallstones. No wall thickening
visualized, wall thickness of 2.6 mm. No sonographic Murphy sign
noted by sonographer.

Common bile duct:

Diameter: 2 mm.

Liver:

No focal lesion identified. Within normal limits in parenchymal
echogenicity. Normal directional flow in the main portal vein.
IMPRESSION: Cholelithiasis without sonographic findings of acute cholecystitis.
No biliary dilatation.

## 2017-12-18 ENCOUNTER — Telehealth: Payer: Self-pay | Admitting: Radiology

## 2017-12-18 NOTE — Telephone Encounter (Signed)
Left message on the cell phone voicemail to call [id number] to schedule f/u appointment with Nada MaclachlanKaren Teague Clark in May.

## 2018-01-08 ENCOUNTER — Ambulatory Visit (INDEPENDENT_AMBULATORY_CARE_PROVIDER_SITE_OTHER): Payer: Medicaid Other

## 2018-01-08 VITALS — BP 120/78 | HR 93 | Temp 97.2°F | Resp 18 | Wt 227.5 lb

## 2018-01-08 DIAGNOSIS — Z30013 Encounter for initial prescription of injectable contraceptive: Secondary | ICD-10-CM

## 2018-01-08 DIAGNOSIS — Z3042 Encounter for surveillance of injectable contraceptive: Secondary | ICD-10-CM

## 2018-01-08 MED ORDER — MEDROXYPROGESTERONE ACETATE 150 MG/ML IM SUSP
150.0000 mg | Freq: Once | INTRAMUSCULAR | Status: AC
  Administered 2018-01-08: 150 mg via INTRAMUSCULAR

## 2018-01-08 NOTE — Progress Notes (Signed)
HPI: Patient is here for a Depo Provera injection. Last injection was 10/16/17 - Left Deltoid. Window - 04/01 - 01/15/18 - patient is with in range.  Assessment and Plan: Patient tolerated injection - Right Delotid - well without complications. Patient advised to schedule next injection between 06/24 - 04/09/18.

## 2018-01-08 NOTE — Progress Notes (Signed)
I have reviewed the chart and agree with nursing staff's documentation of this patient's encounter.  Catalina AntiguaPeggy Emira Eubanks, MD 01/08/2018 3:16 PM

## 2018-04-02 ENCOUNTER — Ambulatory Visit: Payer: Medicaid Other

## 2018-04-04 ENCOUNTER — Ambulatory Visit: Payer: Medicaid Other

## 2018-04-09 ENCOUNTER — Ambulatory Visit: Payer: Medicaid Other

## 2018-04-10 ENCOUNTER — Ambulatory Visit (INDEPENDENT_AMBULATORY_CARE_PROVIDER_SITE_OTHER): Payer: Medicaid Other

## 2018-04-10 DIAGNOSIS — Z3042 Encounter for surveillance of injectable contraceptive: Secondary | ICD-10-CM

## 2018-04-10 MED ORDER — MEDROXYPROGESTERONE ACETATE 150 MG/ML IM SUSP
150.0000 mg | Freq: Once | INTRAMUSCULAR | Status: AC
  Administered 2018-04-10: 150 mg via INTRAMUSCULAR

## 2018-04-10 NOTE — Progress Notes (Signed)
Pt presents for Depo Injection. Given with no problems. Left Deltoid Pt advised to stay in window /schedule for injectons Pt denies any intercourse at all at this time.   Last Depo: 01/08/18  Next Depo: 06/26/18-07/10/18  Pt supplied Exp: 05/2020

## 2018-07-02 ENCOUNTER — Ambulatory Visit: Payer: Medicaid Other

## 2018-07-18 ENCOUNTER — Emergency Department (HOSPITAL_COMMUNITY)
Admission: EM | Admit: 2018-07-18 | Discharge: 2018-07-18 | Disposition: A | Discharge status: Home / Self Care | Payer: Medicaid Other | Attending: Emergency Medicine | Admitting: Emergency Medicine

## 2018-07-18 ENCOUNTER — Other Ambulatory Visit: Payer: Self-pay

## 2018-07-18 DIAGNOSIS — R110 Nausea: Secondary | ICD-10-CM | POA: Insufficient documentation

## 2018-07-18 DIAGNOSIS — F17210 Nicotine dependence, cigarettes, uncomplicated: Secondary | ICD-10-CM | POA: Insufficient documentation

## 2018-07-18 DIAGNOSIS — R1084 Generalized abdominal pain: Secondary | ICD-10-CM | POA: Diagnosis not present

## 2018-07-18 DIAGNOSIS — Z79899 Other long term (current) drug therapy: Secondary | ICD-10-CM | POA: Insufficient documentation

## 2018-07-18 DIAGNOSIS — I10 Essential (primary) hypertension: Secondary | ICD-10-CM | POA: Diagnosis not present

## 2018-07-18 LAB — URINALYSIS, ROUTINE W REFLEX MICROSCOPIC
BILIRUBIN URINE: NEGATIVE
Glucose, UA: NEGATIVE mg/dL
Hgb urine dipstick: NEGATIVE
KETONES UR: NEGATIVE mg/dL
NITRITE: NEGATIVE
PROTEIN: NEGATIVE mg/dL
Specific Gravity, Urine: 1.023 (ref 1.005–1.030)
pH: 6 (ref 5.0–8.0)

## 2018-07-18 LAB — CBC
HEMATOCRIT: 38.6 % (ref 36.0–46.0)
HEMOGLOBIN: 11.7 g/dL — AB (ref 12.0–15.0)
MCH: 22.2 pg — AB (ref 26.0–34.0)
MCHC: 30.3 g/dL (ref 30.0–36.0)
MCV: 73.1 fL — ABNORMAL LOW (ref 80.0–100.0)
Platelets: 494 10*3/uL — ABNORMAL HIGH (ref 150–400)
RBC: 5.28 MIL/uL — AB (ref 3.87–5.11)
RDW: 16.4 % — ABNORMAL HIGH (ref 11.5–15.5)
WBC: 9.9 10*3/uL (ref 4.0–10.5)
nRBC: 0 % (ref 0.0–0.2)

## 2018-07-18 LAB — COMPREHENSIVE METABOLIC PANEL
ALT: 19 U/L (ref 0–44)
ANION GAP: 8 (ref 5–15)
AST: 15 U/L (ref 15–41)
Albumin: 3.7 g/dL (ref 3.5–5.0)
Alkaline Phosphatase: 56 U/L (ref 38–126)
BILIRUBIN TOTAL: 0.4 mg/dL (ref 0.3–1.2)
CHLORIDE: 111 mmol/L (ref 98–111)
CO2: 25 mmol/L (ref 22–32)
Calcium: 9.2 mg/dL (ref 8.9–10.3)
Creatinine, Ser: 0.67 mg/dL (ref 0.44–1.00)
GFR calc Af Amer: >60 mL/min (ref >60)
GFR calc non Af Amer: >60 mL/min (ref >60)
GLUCOSE: 89 mg/dL (ref 70–99)
POTASSIUM: 3.8 mmol/L (ref 3.5–5.1)
Sodium: 144 mmol/L (ref 135–145)
TOTAL PROTEIN: 8.2 g/dL — AB (ref 6.5–8.1)

## 2018-07-18 LAB — LIPASE, BLOOD: LIPASE: 26 U/L (ref 11–51)

## 2018-07-18 LAB — I-STAT BETA HCG BLOOD, ED (MC, WL, AP ONLY)

## 2018-07-18 MED ORDER — POLYETHYLENE GLYCOL 3350 17 GM/SCOOP PO POWD: 17.0000 g | Freq: Every day | ORAL | 0 refills | Status: DC

## 2018-07-18 MED ORDER — ACETAMINOPHEN 325 MG PO TABS
650.0000 mg | ORAL_TABLET | Freq: Once | ORAL | Status: AC
  Administered 2018-07-18: 650 mg via ORAL
  Filled 2018-07-18: qty 2

## 2018-07-18 MED ORDER — DOCUSATE SODIUM 100 MG PO CAPS: 100.0000 mg | ORAL_CAPSULE | Freq: Every day | ORAL | 0 refills | Status: DC | PRN

## 2018-07-18 NOTE — ED Notes (Signed)
Tried to set up pelvic cart , pt. REFUSED pelvic exam. To notify MD.

## 2018-07-18 NOTE — Discharge Instructions (Signed)
Your work-up thus far today is very reassuring.  Please return to the emergency department immediately if you develop any worsening pain, focal pain in the lower abdomen, nausea or vomiting the prevention from keeping anything down, fevers with abdominal pain, or stools that are black in color or with blood in them.  Thank you for allowing [place] to participate in your care today.

## 2018-07-18 NOTE — ED Notes (Signed)
Refused for V/S check prior to discharge.

## 2018-07-18 NOTE — ED Triage Notes (Signed)
Patient arrives with c/o abdominal pain x3 days. Patient reports pepcid. +Nausea, -vomiting. Patient reports small bowel movements. LMP 3 weeks ago. Denies urinary/vaginal complaints.

## 2018-07-18 NOTE — ED Provider Notes (Signed)
[facility]-EMERGENCY DEPT Provider Note   CSN: [id number] Arrival date & time: 07/18/18  1631     History   Chief Complaint Chief Complaint  Patient presents with  . Abdominal Pain    HPI [name] [name] is a 27 y.o. female.  HPI  Patient is a 27 year old female with a history of cholecystitis, cholelithiasis, hypertension, anemia, anxiety presenting for lower abdominal pain.  Patient reports that she has had pain for the past 3 days.  She reports that it initially began as a dull pain that she thought was due to constipation or gas, so she tried taking laxatives and Gas-X.  Patient reports she also tried Pepcid with no relief.  Patient reports that she passed some soft stool without melena or hematochezia, and is also passing gas.  She denies dysuria, urgency, frequency, flank pain, vaginal discharge, or vaginal bleeding would not time for menses.  Patient denies any fever or chills.  She reports nausea with low appetite but no vomiting.  Patient reports history of biliary colic, but reports this is different pain.  No abdominal surgical history.  Patient denies sexual intercourse in greater than 1 year.  Reports smoking, but no illicit drug use.   Past Medical History:  Diagnosis Date  . Anemia   . Anxiety   . Chlamydia 10-2011  . Cholecystitis   . Gestational hypertension   . Headache(784.0)   . Heart palpitations   . Hypertension     Patient Active Problem List   Diagnosis Date Noted  . Fetal pericardial effusion affecting management of mother 08/11/2017  . Pre-eclampsia 07/31/2017  . Group B streptococcus urinary tract infection affecting pregnancy 04/24/2017  . Encounter for supervision of normal pregnancy, unspecified, unspecified trimester 04/10/2017  . History of gestational hypertension 04/10/2017  . Short interval between pregnancies affecting pregnancy, antepartum 04/10/2017  . Murmur, cardiac 02/10/2016    Past Surgical History:  Procedure  Laterality Date  . ORIF ANKLE FRACTURE Left 02/12/2016   Procedure: OPEN REDUCTION INTERNAL FIXATION (ORIF) ANKLE FRACTURE;  Surgeon: Myrene Galas, MD;  Location: [facility] OR;  Service: Orthopedics;  Laterality: Left;     OB History    Gravida  4   Para  4   Term  3   Preterm  1   AB  0   Living  4     SAB  0   TAB  0   Ectopic  0   Multiple  0   Live Births  4            Home Medications    Prior to Admission medications   Medication Sig Start Date End Date Taking? Authorizing Provider  baclofen (LIORESAL) 10 MG tablet Take 1 tablet (10 mg total) by mouth 3 (three) times daily. 11/17/17   Teague Chestine Spore, Scot Jun, PA-C  butalbital-acetaminophen-caffeine (ESGIC) [phone] MG tablet Take 2 tablets by mouth every 6 (six) hours as needed for headache. 10/09/17   Brock Bad, MD  carvedilol (COREG) 6.25 MG tablet Take 1 tablet (6.25 mg total) by mouth 2 (two) times daily with a meal. 10/09/17   Brock Bad, MD  medroxyPROGESTERone (DEPO-PROVERA) 150 MG/ML injection Inject 1 mL (150 mg total) into the muscle every 3 (three) months. 10/09/17   Brock Bad, MD  SUMAtriptan (IMITREX) 100 MG tablet Take 1 tablet (100 mg total) by mouth once as needed for up to 1 dose for migraine. May repeat in 2 hours if headache persists or  recurs. 11/17/17   Glyn Ade, Scot Jun, PA-C  topiramate (TOPAMAX) 25 MG tablet Take 3 tablets (75 mg total) by mouth daily. 11/17/17   Bertram Denver, PA-C    Family History Family History  Problem Relation Age of Onset  . Diabetes Maternal Grandmother   . Hypertension Maternal Grandmother   . Cancer Maternal Grandmother   . Hypertension Mother   . Anesthesia problems Neg Hx   . Other Neg Hx     Social History Social History   Tobacco Use  . Smoking status: Current Every Day Smoker    Packs/day: 0.25    Years: 3.00    Pack years: 0.75    Types: Cigarettes  . Smokeless tobacco: Never Used  Substance Use Topics  . Alcohol use:  No    Comment: socially  . Drug use: No     Allergies   Metronidazole   Review of Systems Review of Systems  Constitutional: Negative for chills and fever.  HENT: Negative for congestion and sore throat.   Eyes: Negative for visual disturbance.  Respiratory: Negative for cough, chest tightness and shortness of breath.   Cardiovascular: Negative for chest pain.  Gastrointestinal: Positive for abdominal pain, constipation and nausea. Negative for blood in stool, diarrhea and vomiting.  Genitourinary: Negative for dysuria, flank pain, frequency, urgency, vaginal bleeding and vaginal discharge.  Musculoskeletal: Negative for back pain and myalgias.  Skin: Negative for rash.  Neurological: Negative for dizziness, syncope, light-headedness and headaches.     Physical Exam Updated Vital Signs BP (!) 154/97 (BP Location: Left Arm)   Pulse 84   Temp 98.6 F (37 C) (Oral)   Resp 16   Ht 5\' 6"  (1.676 m)   Wt 95.3 kg   LMP 06/27/2018   SpO2 100%   BMI 33.89 kg/m   Physical Exam  Constitutional: She appears well-developed and well-nourished. No distress.  HENT:  Head: Normocephalic and atraumatic.  Mouth/Throat: Oropharynx is clear and moist.  Eyes: Pupils are equal, round, and reactive to light. Conjunctivae and EOM are normal.  Neck: Normal range of motion. Neck supple.  Cardiovascular: Normal rate, regular rhythm, S1 normal and S2 normal.  No murmur heard. Pulmonary/Chest: Effort normal and breath sounds normal. She has no wheezes. She has no rales.  Abdominal: Soft. Normal appearance and bowel sounds are normal. She exhibits no distension. There is tenderness. There is no guarding.  Generalized abdominal tenderness in the periumbilical region and lower abdomen without focal nature.  No focal right lower quadrant or left lower quadrant tenderness.  Musculoskeletal: Normal range of motion. She exhibits no edema or deformity.  Lymphadenopathy:    She has no cervical  adenopathy.  Neurological: She is alert.  Cranial nerves grossly intact. Patient moves extremities symmetrically and with good coordination.  Skin: Skin is warm and dry. No rash noted. No erythema.  Psychiatric: She has a normal mood and affect. Her behavior is normal. Judgment and thought content normal.  Nursing note and vitals reviewed.    ED Treatments / Results  Labs (all labs ordered are listed, but only abnormal results are displayed) Labs Reviewed  COMPREHENSIVE METABOLIC PANEL - Abnormal; Notable for the following components:      Result Value   BUN <5 (*)    Total Protein 8.2 (*)    All other components within normal limits  CBC - Abnormal; Notable for the following components:   RBC 5.28 (*)    Hemoglobin 11.7 (*)  MCV 73.1 (*)    MCH 22.2 (*)    RDW 16.4 (*)    Platelets 494 (*)    All other components within normal limits  LIPASE, BLOOD  URINALYSIS, ROUTINE W REFLEX MICROSCOPIC  I-STAT BETA HCG BLOOD, ED ([facility], [facility], [facility] ONLY)    EKG None  Radiology No results found.  Procedures Procedures (including critical care time)  Medications Ordered in ED Medications  acetaminophen (TYLENOL) tablet 650 mg (has no administration in time range)     Initial Impression / Assessment and Plan / ED Course  I have reviewed the triage vital signs and the nursing notes.  Pertinent labs & imaging results that were available during my care of the patient were reviewed by me and considered in my medical decision making (see chart for details).  Clinical Course as of Jul 19 29  Wed Jul 18, 2018  2033 No urinary symptoms. Do not suspect UTI.  Urinalysis, Routine w reflex microscopic(!) [AM]  2057 Patient only wanting to proceed with workup if someone can watch her children.   [AM]    Clinical Course User Index [AM] Aviva Kluver B, PA-C    Differential diagnosis includes appendicitis, diverticulitis, small bowel obstruction, urinary tract infection, pyelonephritis,  PID, constipation.  Doubt ovarian torsion, as patient had no sudden onset of the pain, and pain is not focal.  Work-up demonstrating no leukocytosis.  Patient has improved hemoglobin from all prior values.  See below for trend.  Urinalysis without evidence of infection, and patient is not having urinary symptoms.    Hemoglobin  Date Value Ref Range Status  07/18/2018 11.7 (L) 12.0 - 15.0 g/dL Final  65/78/4696 9.0 (L) 12.0 - 15.0 g/dL Final  29/52/8413 8.9 (L) 12.0 - 15.0 g/dL Final  24/40/1027 8.8 (L) 11.1 - 15.9 g/dL Final  25/36/6440 8.2 (L) 12.0 - 15.0 g/dL Final  34/74/2595 8.7 (L) 11.1 - 15.9 g/dL Final  63/87/5643 8.9 (L) 11.1 - 15.9 g/dL Final  32/95/1884 16.6 (L) 11.1 - 15.9 g/dL Final   HGB  Date Value Ref Range Status  07/24/2017 9.5 (L) 11.6 - 15.9 g/dL Final   Recommended to patient that she received pelvic examination, but patient declined, she states that she has not been sexually active in greater than 1 year and does not believe this is the source of her pain.  Patient reports that she unable to be examined further with imaging via CT scan due to having her 4 young children in the room, and is not able to have another adult watch them.  Options were discussed with patient including contacting Department of Social Services to see if she could have someone temporarily watch the children while she received medical evaluation.  Discussed with charge RN Reita Cliche who discussed with hospital [facility] who states that no hospital staff available or legally able to watch children during evaluation. Patient reports that she did not want to pursue this option, and would like to return at another time when her mother is available to watch her children.   Repeat abdominal exam nonsurgical.  Do not suspect acute surgical cause of patient's pain such as appendicitis, high-grade bowel obstruction, or perforated viscus.  Return precautions extensively discussed with patient including abdominal pain with  fever or chills, intractable nausea or vomiting, or focal tenderness.  Patient is in understanding and agrees with the plan of care.  Final Clinical Impressions(s) / ED Diagnoses   Final diagnoses:  Generalized abdominal pain  Elevated blood pressure reading  with diagnosis of hypertension    ED Discharge Orders    None       Delia Chimes 07/19/18 0037    Tilden Fossa, MD 07/19/18 [phone]

## 2018-08-01 ENCOUNTER — Ambulatory Visit (INDEPENDENT_AMBULATORY_CARE_PROVIDER_SITE_OTHER): Payer: Medicaid Other

## 2018-08-01 DIAGNOSIS — Z3202 Encounter for pregnancy test, result negative: Secondary | ICD-10-CM

## 2018-08-01 DIAGNOSIS — Z23 Encounter for immunization: Secondary | ICD-10-CM

## 2018-08-01 DIAGNOSIS — Z3042 Encounter for surveillance of injectable contraceptive: Secondary | ICD-10-CM

## 2018-08-01 LAB — POCT URINE PREGNANCY: Preg Test, Ur: NEGATIVE

## 2018-08-01 NOTE — Progress Notes (Signed)
Pt is here for depo injection. Depo injection is past due (was due to be given by October 6th). UPT today in office is negative. Instructed pt to return in two weeks for another UPT and as long as it is negative we can restart her depo injections at that time. Pt verbalized understanding. Pt given flu shot at todays visit (per [employer] from [place]).

## 2018-08-23 ENCOUNTER — Ambulatory Visit (INDEPENDENT_AMBULATORY_CARE_PROVIDER_SITE_OTHER): Payer: Medicaid Other

## 2018-08-23 DIAGNOSIS — Z3202 Encounter for pregnancy test, result negative: Secondary | ICD-10-CM | POA: Diagnosis not present

## 2018-08-23 DIAGNOSIS — Z3042 Encounter for surveillance of injectable contraceptive: Secondary | ICD-10-CM | POA: Diagnosis not present

## 2018-08-23 LAB — POCT URINE PREGNANCY: Preg Test, Ur: NEGATIVE

## 2018-08-23 MED ORDER — MEDROXYPROGESTERONE ACETATE 150 MG/ML IM SUSP
150.0000 mg | Freq: Once | INTRAMUSCULAR | Status: AC
  Administered 2018-08-23: 150 mg via INTRAMUSCULAR

## 2018-08-23 NOTE — Progress Notes (Signed)
Pt here for second pregnancy test for depo restart. Test today is negative. Pt denies having IC. Depo inj given in R Deltoid. Pt tolerated well.

## 2018-11-12 ENCOUNTER — Ambulatory Visit: Payer: Medicaid Other

## 2018-11-15 ENCOUNTER — Ambulatory Visit: Payer: Medicaid Other

## 2018-11-15 ENCOUNTER — Other Ambulatory Visit: Payer: Self-pay | Admitting: Obstetrics

## 2018-11-15 DIAGNOSIS — Z30013 Encounter for initial prescription of injectable contraceptive: Secondary | ICD-10-CM

## 2018-11-19 ENCOUNTER — Ambulatory Visit: Payer: Medicaid Other | Admitting: Obstetrics

## 2018-11-21 ENCOUNTER — Ambulatory Visit (INDEPENDENT_AMBULATORY_CARE_PROVIDER_SITE_OTHER): Payer: Medicaid Other

## 2018-11-21 DIAGNOSIS — Z3042 Encounter for surveillance of injectable contraceptive: Secondary | ICD-10-CM | POA: Diagnosis not present

## 2018-11-21 MED ORDER — MEDROXYPROGESTERONE ACETATE 150 MG/ML IM SUSP
150.0000 mg | Freq: Once | INTRAMUSCULAR | Status: AC
  Administered 2018-11-21: 150 mg via INTRAMUSCULAR

## 2018-11-21 NOTE — Progress Notes (Signed)
Pt is here for depo injection, pt is on time. Injection given- patient tolerated well. Next injection due 5/7-5/21.

## 2018-12-03 ENCOUNTER — Ambulatory Visit: Payer: Medicaid Other | Admitting: Obstetrics

## 2019-02-11 ENCOUNTER — Other Ambulatory Visit: Payer: Self-pay

## 2019-02-11 ENCOUNTER — Other Ambulatory Visit: Payer: Self-pay | Admitting: Obstetrics

## 2019-02-11 DIAGNOSIS — Z30013 Encounter for initial prescription of injectable contraceptive: Secondary | ICD-10-CM

## 2019-02-11 MED ORDER — MEDROXYPROGESTERONE ACETATE 150 MG/ML IM SUSP: 150.0000 mg | INTRAMUSCULAR | 0 refills | Status: DC

## 2019-02-12 ENCOUNTER — Ambulatory Visit (INDEPENDENT_AMBULATORY_CARE_PROVIDER_SITE_OTHER): Payer: Medicaid Other

## 2019-02-12 DIAGNOSIS — Z3042 Encounter for surveillance of injectable contraceptive: Secondary | ICD-10-CM

## 2019-02-13 ENCOUNTER — Other Ambulatory Visit: Payer: Self-pay

## 2019-02-13 MED ORDER — MEDROXYPROGESTERONE ACETATE 150 MG/ML IM SUSP
150.0000 mg | INTRAMUSCULAR | Status: DC
  Administered 2019-02-12 – 2020-01-28 (×5): 150 mg via INTRAMUSCULAR

## 2019-02-13 NOTE — Progress Notes (Signed)
Pt is in the office for depo injection, administered in L Del and pt tolerated well. Next due 7/28-8/11.  .. Administrations This Visit    medroxyPROGESTERone (DEPO-PROVERA) injection 150 mg    Admin Date 02/12/2019 Action Given Dose 150 mg Route Intramuscular Administered By Katrina Stack, RN

## 2019-02-21 NOTE — Progress Notes (Signed)
Patient ID: [name] [name], female   DOB: 16-Mar-1991, 28 y.o.   MRN: [id number] I have reviewed the chart and agree with nursing staff's documentation of this patient's encounter.  Scheryl Darter, MD 02/21/2019 4:00 PM

## 2019-05-06 ENCOUNTER — Ambulatory Visit (INDEPENDENT_AMBULATORY_CARE_PROVIDER_SITE_OTHER): Payer: Medicaid Other

## 2019-05-06 ENCOUNTER — Other Ambulatory Visit: Payer: Self-pay

## 2019-05-06 DIAGNOSIS — Z30013 Encounter for initial prescription of injectable contraceptive: Secondary | ICD-10-CM

## 2019-05-06 MED ORDER — MEDROXYPROGESTERONE ACETATE 150 MG/ML IM SUSP: 150.0000 mg | INTRAMUSCULAR | 3 refills | Status: DC

## 2019-05-06 NOTE — Progress Notes (Signed)
Pt has Depo inj visit today needs rf.

## 2019-05-06 NOTE — Progress Notes (Addendum)
Pt presents for pt supplied Depo Depo given in R Del w/o difficulty Next Depo due Oct 19 - Nov 2nd, pt agrees\  Attestation of Attending Supervision of CMA/RN: Evaluation and management procedures were performed by the nurse under my supervision and collaboration.  I have reviewed the nursing note and chart, and I agree with the management and plan.  Carolyn L. Harraway-Smith, M.D., Cherlynn June

## 2019-07-22 DIAGNOSIS — Z111 Encounter for screening for respiratory tuberculosis: Secondary | ICD-10-CM | POA: Diagnosis not present

## 2019-07-29 ENCOUNTER — Ambulatory Visit: Payer: Medicaid Other

## 2019-08-06 ENCOUNTER — Other Ambulatory Visit: Payer: Self-pay

## 2019-08-06 ENCOUNTER — Ambulatory Visit (INDEPENDENT_AMBULATORY_CARE_PROVIDER_SITE_OTHER): Payer: Medicaid Other | Admitting: *Deleted

## 2019-08-06 DIAGNOSIS — Z3042 Encounter for surveillance of injectable contraceptive: Secondary | ICD-10-CM | POA: Diagnosis not present

## 2019-08-06 NOTE — Progress Notes (Signed)
Pt is in office for Depo injection. Pt states she has not been sexually active for the past 4 months.  Pt states she has had irregular bleeding and cramping since last injection.  Pt advised to follow up in office to discuss [facility] if she continues to have problems and would like to switch options.   Depo given today, tolerated well.    Pt advised to RTO 1/19-11/06/2019 for next depo injection.  Administrations This Visit    medroxyPROGESTERone (DEPO-PROVERA) injection 150 mg    Admin Date 08/06/2019 Action Given Dose 150 mg Route Intramuscular Administered By Valene Bors, CMA

## 2019-08-08 NOTE — Progress Notes (Signed)
I have reviewed this chart and agree with the RN/CMA assessment and management.    K. Meryl Papa [place], M.D. Attending [facility]Faculty [facility])   

## 2019-10-29 ENCOUNTER — Ambulatory Visit (INDEPENDENT_AMBULATORY_CARE_PROVIDER_SITE_OTHER): Payer: Medicaid Other

## 2019-10-29 ENCOUNTER — Other Ambulatory Visit: Payer: Self-pay

## 2019-10-29 DIAGNOSIS — Z3042 Encounter for surveillance of injectable contraceptive: Secondary | ICD-10-CM | POA: Diagnosis not present

## 2019-10-29 NOTE — Progress Notes (Signed)
Pt is in the office for depo injection, administered in L del and pt tolerated well. Next due Apr 13-27. .. Administrations This Visit    medroxyPROGESTERone (DEPO-PROVERA) injection 150 mg    Admin Date 10/29/2019 Action Given Dose 150 mg Route Intramuscular Administered By Katrina Stack, RN

## 2019-10-30 NOTE — Progress Notes (Signed)
Patient seen and assessed by nursing staff during this encounter. I have reviewed the chart and agree with the documentation and plan.  Catalina Antigua, MD 10/30/2019 8:44 AM

## 2019-11-01 DIAGNOSIS — Z111 Encounter for screening for respiratory tuberculosis: Secondary | ICD-10-CM | POA: Diagnosis not present

## 2019-11-04 DIAGNOSIS — Z111 Encounter for screening for respiratory tuberculosis: Secondary | ICD-10-CM | POA: Diagnosis not present

## 2019-11-14 ENCOUNTER — Emergency Department (HOSPITAL_COMMUNITY)
Admission: EM | Admit: 2019-11-14 | Discharge: 2019-11-14 | Disposition: A | Discharge status: Home / Self Care | Payer: Medicaid Other | Attending: Emergency Medicine | Admitting: Emergency Medicine

## 2019-11-14 ENCOUNTER — Encounter (HOSPITAL_COMMUNITY): Payer: Self-pay | Admitting: Emergency Medicine

## 2019-11-14 ENCOUNTER — Other Ambulatory Visit: Payer: Self-pay

## 2019-11-14 DIAGNOSIS — R3915 Urgency of urination: Secondary | ICD-10-CM | POA: Insufficient documentation

## 2019-11-14 DIAGNOSIS — R102 Pelvic and perineal pain: Secondary | ICD-10-CM | POA: Diagnosis present

## 2019-11-14 DIAGNOSIS — F17210 Nicotine dependence, cigarettes, uncomplicated: Secondary | ICD-10-CM | POA: Insufficient documentation

## 2019-11-14 DIAGNOSIS — N898 Other specified noninflammatory disorders of vagina: Secondary | ICD-10-CM

## 2019-11-14 DIAGNOSIS — I10 Essential (primary) hypertension: Secondary | ICD-10-CM | POA: Diagnosis not present

## 2019-11-14 LAB — WET PREP, GENITAL
Clue Cells Wet Prep HPF POC: NONE SEEN
Sperm: NONE SEEN
Trich, Wet Prep: NONE SEEN
Yeast Wet Prep HPF POC: NONE SEEN

## 2019-11-14 MED ORDER — DOXYCYCLINE HYCLATE 100 MG PO CAPS: 100.0000 mg | ORAL_CAPSULE | Freq: Two times a day (BID) | ORAL | 0 refills | Status: AC

## 2019-11-14 NOTE — Discharge Instructions (Addendum)
Take antibiotics as prescribed.  Take entire course, even if your symptoms improve. Use Tylenol or ibuprofen as needed for pain. Use warm compresses and/or a sitz bath to help with pain and inflammation. Follow-up with your OB/GYN if symptoms not improving.  Your gonorrhea and chlamydia tests from today are pending.  If positive, you will receive a phone call, and you can follow-up with your OB/GYN for treatment. Return to the emergency room with any new, worsening, concerning symptoms.

## 2019-11-14 NOTE — ED Notes (Signed)
Pt dc'd home w/all belongings, a/o x4, ambulatory on dc  

## 2019-11-14 NOTE — ED Provider Notes (Signed)
[facility] EMERGENCY DEPARTMENT Provider Note   CSN: [id number] Arrival date & time: 11/14/19  6962     History Chief Complaint  Patient presents with  . Vaginal Pain    [name] [name] is a 29 y.o. female presenting for evaluation of vaginal pain.  Patient states that the past 3 days, she has been having 1 area of vaginal pain.  She thought it was a small pimple and tried to pop it, this made her symptoms worse.  Patient states that area feels very irritated, and stings when she urinates.  She denies dysuria or abdominal discomfort.  She has fevers, chills, nausea, vomiting.  She denies abdominal pain.  She denies vaginal discharge.  She has an OB/GYN, last saw them 2 weeks ago during which she got her Depo shot. She has not called her ob/gyn about her sxs today.  She had a negative pregnancy test at that time.  Patient states she has not been sexually active for several months.  She has been tested for HIV and syphilis in the past, has been negative.  Additional history obtained from chart review.  Patient follows regularly with OB/GYN for Depo-Provera shots.  History of anxiety, chlamydia, hypertension.  HPI     Past Medical History:  Diagnosis Date  . Anemia   . Anxiety   . Chlamydia 10-2011  . Cholecystitis   . Gestational hypertension   . Headache(784.0)   . Heart palpitations   . Hypertension     Patient Active Problem List   Diagnosis Date Noted  . Fetal pericardial effusion affecting management of mother 08/11/2017  . Pre-eclampsia 07/31/2017  . Group B streptococcus urinary tract infection affecting pregnancy 04/24/2017  . Encounter for supervision of normal pregnancy, unspecified, unspecified trimester 04/10/2017  . History of gestational hypertension 04/10/2017  . Short interval between pregnancies affecting pregnancy, antepartum 04/10/2017  . Murmur, cardiac 02/10/2016    Past Surgical History:  Procedure Laterality Date  . ORIF ANKLE  FRACTURE Left 02/12/2016   Procedure: OPEN REDUCTION INTERNAL FIXATION (ORIF) ANKLE FRACTURE;  Surgeon: Altamese Goodrich, MD;  Location: [place];  Service: Orthopedics;  Laterality: Left;     OB History    Gravida  4   Para  4   Term  3   Preterm  1   AB  0   Living  4     SAB  0   TAB  0   Ectopic  0   Multiple  0   Live Births  4           Family History  Problem Relation Age of Onset  . Diabetes Maternal Grandmother   . Hypertension Maternal Grandmother   . Cancer Maternal Grandmother   . Hypertension Mother   . Anesthesia problems Neg Hx   . Other Neg Hx     Social History   Tobacco Use  . Smoking status: Current Every Day Smoker    Packs/day: 0.25    Years: 3.00    Pack years: 0.75    Types: Cigarettes  . Smokeless tobacco: Never Used  Substance Use Topics  . Alcohol use: No    Comment: socially  . Drug use: No    Home Medications Prior to Admission medications   Medication Sig Start Date End Date Taking? Authorizing Provider  baclofen (LIORESAL) 10 MG tablet Take 1 tablet (10 mg total) by mouth 3 (three) times daily. Patient not taking: Reported on 07/18/2018 11/17/17   Jaclyn Prime,  Scot Jun, PA-C  butalbital-acetaminophen-caffeine (ESGIC) 50-325-40 MG tablet Take 2 tablets by mouth every 6 (six) hours as needed for headache. Patient not taking: Reported on 07/18/2018 10/09/17   Brock Bad, MD  carvedilol (COREG) 6.25 MG tablet Take 1 tablet (6.25 mg total) by mouth 2 (two) times daily with a meal. Patient not taking: Reported on 07/18/2018 10/09/17   Brock Bad, MD  doxycycline (VIBRAMYCIN) 100 MG capsule Take 1 capsule (100 mg total) by mouth 2 (two) times daily for 7 days. 11/14/19 11/21/19  Sabryna Lahm, PA-C  medroxyPROGESTERone (DEPO-PROVERA) 150 MG/ML injection Inject 1 mL (150 mg total) into the muscle every 3 (three) months. Patient not taking: Reported on 02/13/2019 02/11/19   Brock Bad, MD  medroxyPROGESTERone  (DEPO-PROVERA) 150 MG/ML injection Inject 1 mL (150 mg total) into the muscle every 3 (three) months. Patient not taking: Reported on 10/29/2019 05/06/19   Brock Bad, MD  polyethylene glycol powder [facility]) powder Take 17 g by mouth daily. Patient not taking: Reported on 02/13/2019 07/18/18   Aviva Kluver B, PA-C  SUMAtriptan (IMITREX) 100 MG tablet Take 1 tablet (100 mg total) by mouth once as needed for up to 1 dose for migraine. May repeat in 2 hours if headache persists or recurs. Patient not taking: Reported on 07/18/2018 11/17/17   Glyn Ade, Scot Jun, PA-C  topiramate (TOPAMAX) 25 MG tablet Take 3 tablets (75 mg total) by mouth daily. Patient not taking: Reported on 07/18/2018 11/17/17   Glyn Ade, Scot Jun, PA-C    Allergies    Metronidazole  Review of Systems   Review of Systems  Gastrointestinal: Negative for nausea and vomiting.  Genitourinary: Positive for urgency and vaginal pain. Negative for difficulty urinating, dysuria, flank pain, frequency, hematuria, pelvic pain, vaginal bleeding and vaginal discharge.  All other systems reviewed and are negative.   Physical Exam Updated Vital Signs BP 139/85 (BP Location: Right Arm)   Pulse 97   Temp 98.3 F (36.8 C) (Oral)   Resp 20   SpO2 100%   Physical Exam Vitals and nursing note reviewed. Exam conducted with a chaperone present.  Constitutional:      General: She is not in acute distress.    Appearance: She is well-developed.     Comments: Resting comfortably no acute distress  HENT:     Head: Normocephalic and atraumatic.  Eyes:     Conjunctiva/sclera: Conjunctivae normal.     Pupils: Pupils are equal, round, and reactive to light.  Cardiovascular:     Rate and Rhythm: Normal rate and regular rhythm.     Pulses: Normal pulses.  Pulmonary:     Effort: Pulmonary effort is normal. No respiratory distress.     Breath sounds: Normal breath sounds. No wheezing.  Abdominal:     General: There is no  distension.     Palpations: Abdomen is soft. There is no mass.     Tenderness: There is no abdominal tenderness. There is no guarding or rebound.     Comments: No tenderness palpation the abdomen.  Soft without rigidity, guarding, distention.  Negative rebound.  Genitourinary:    Cervix: Discharge and friability present. No cervical motion tenderness, erythema or cervical bleeding.     Uterus: Normal.        Comments: <1cm area of mild swelling and tenderness of the R lower labia. No ulceration. No induration. No active drainage.  Minimal thin white discharge, likely physiologic. Cervix friable. No cmt.  Musculoskeletal:  General: Normal range of motion.     Cervical back: Normal range of motion and neck supple.  Skin:    General: Skin is warm and dry.  Neurological:     Mental Status: She is alert and oriented to person, place, and time.     ED Results / Procedures / Treatments   Labs (all labs ordered are listed, but only abnormal results are displayed) Labs Reviewed  WET PREP, GENITAL - Abnormal; Notable for the following components:      Result Value   WBC, Wet Prep HPF POC MODERATE (*)    All other components within normal limits  GC/CHLAMYDIA PROBE AMP () NOT AT [facility]    EKG None  Radiology No results found.  Procedures Procedures (including critical care time)  Medications Ordered in ED Medications - No data to display  ED Course  I have reviewed the triage vital signs and the nursing notes.  Pertinent labs & imaging results that were available during my care of the patient were reviewed by me and considered in my medical decision making (see chart for details).    MDM Rules/Calculators/A&P                      Pt presenting for evaluation of vaginal lesion. physical exam shows pt who is nontoxic. No abd pain. No urinary symptoms (pain with urination is when urine hits lesion, not dysuria). Pelvic exam shows lesion of R lower labia without  active drainage. Very small without induration. Surrounds hair follicle. Likely folliculitis with surrounding inflammation. No obvious abscess at this time. Pt requesting gc/cl and wet prep testing. Does not want hiv/rpr testing. Exam is not consistent with herpes or syphilis.   Wet prep interpreted by me, no clue cells to indicate BV or yeast.  Does show white cells, gonorrhea committee are pending.  However, without sxs will not tx unless tests are positive. Will place pt on abx for folliculitis and have pt use warm compress/sitz baths. Will have pt f/u with ob/gyn as needed for tx of sti and/or continued vaginal pain. At this time, pt appears safe for s/c. Return precautions given. Pt states she understands and agrees to plan.   Final Clinical Impression(s) / ED Diagnoses Final diagnoses:  Vaginal lesion    Rx / DC Orders ED Discharge Orders         Ordered    doxycycline (VIBRAMYCIN) 100 MG capsule  2 times daily     11/14/19 1013           Eli Adami, PA-C 11/14/19 1031    Eber Hong, MD 11/14/19 1105

## 2019-11-14 NOTE — ED Triage Notes (Signed)
Patient c/o vaginal irritation with a possible sore and dysuria for past couple days. Patient states she has not been sexually active since December.

## 2019-11-15 LAB — GC/CHLAMYDIA PROBE AMP (~~LOC~~) NOT AT ARMC
Chlamydia: NEGATIVE
Neisseria Gonorrhea: NEGATIVE

## 2020-01-21 ENCOUNTER — Ambulatory Visit: Payer: Medicaid Other

## 2020-01-28 ENCOUNTER — Ambulatory Visit (INDEPENDENT_AMBULATORY_CARE_PROVIDER_SITE_OTHER): Payer: Medicaid Other | Admitting: *Deleted

## 2020-01-28 ENCOUNTER — Other Ambulatory Visit: Payer: Self-pay

## 2020-01-28 DIAGNOSIS — Z3042 Encounter for surveillance of injectable contraceptive: Secondary | ICD-10-CM

## 2020-01-28 NOTE — Progress Notes (Signed)
Pt is in office for Depo injection.  Pt is on time for injection.  Injection given, pt tolerated well.   Next depo due July 13-27.  Administrations This Visit    medroxyPROGESTERone (DEPO-PROVERA) injection 150 mg    Admin Date 01/28/2020 Action Given Dose 150 mg Route Intramuscular Administered By Lanney Gins, CMA

## 2020-04-14 ENCOUNTER — Ambulatory Visit: Payer: Medicaid Other | Admitting: Obstetrics and Gynecology

## 2020-04-20 ENCOUNTER — Ambulatory Visit: Payer: Medicaid Other

## 2020-04-23 ENCOUNTER — Ambulatory Visit: Payer: Medicaid Other | Admitting: Obstetrics

## 2020-05-04 ENCOUNTER — Ambulatory Visit (INDEPENDENT_AMBULATORY_CARE_PROVIDER_SITE_OTHER): Payer: Medicaid Other | Admitting: Obstetrics

## 2020-05-04 ENCOUNTER — Encounter: Payer: Self-pay | Admitting: Obstetrics

## 2020-05-04 ENCOUNTER — Other Ambulatory Visit: Payer: Self-pay

## 2020-05-04 ENCOUNTER — Other Ambulatory Visit (HOSPITAL_COMMUNITY)
Admission: RE | Admit: 2020-05-04 | Discharge: 2020-05-04 | Disposition: A | Discharge status: Home / Self Care | Payer: Medicaid Other | Source: Ambulatory Visit | Attending: Obstetrics | Admitting: Obstetrics

## 2020-05-04 VITALS — BP 147/87 | HR 85 | Ht 66.0 in | Wt 224.0 lb

## 2020-05-04 DIAGNOSIS — Z3009 Encounter for other general counseling and advice on contraception: Secondary | ICD-10-CM

## 2020-05-04 DIAGNOSIS — N760 Acute vaginitis: Secondary | ICD-10-CM | POA: Diagnosis not present

## 2020-05-04 DIAGNOSIS — N898 Other specified noninflammatory disorders of vagina: Secondary | ICD-10-CM | POA: Diagnosis not present

## 2020-05-04 DIAGNOSIS — Z30013 Encounter for initial prescription of injectable contraceptive: Secondary | ICD-10-CM

## 2020-05-04 DIAGNOSIS — Z01419 Encounter for gynecological examination (general) (routine) without abnormal findings: Secondary | ICD-10-CM | POA: Insufficient documentation

## 2020-05-04 DIAGNOSIS — B9689 Other specified bacterial agents as the cause of diseases classified elsewhere: Secondary | ICD-10-CM

## 2020-05-04 DIAGNOSIS — N946 Dysmenorrhea, unspecified: Secondary | ICD-10-CM | POA: Diagnosis not present

## 2020-05-04 DIAGNOSIS — F17200 Nicotine dependence, unspecified, uncomplicated: Secondary | ICD-10-CM

## 2020-05-04 DIAGNOSIS — E669 Obesity, unspecified: Secondary | ICD-10-CM

## 2020-05-04 MED ORDER — TINIDAZOLE 500 MG PO TABS: 1000.0000 mg | ORAL_TABLET | Freq: Every day | ORAL | 2 refills | Status: DC

## 2020-05-04 MED ORDER — MEDROXYPROGESTERONE ACETATE 150 MG/ML IM SUSP: 150.0000 mg | INTRAMUSCULAR | 3 refills | Status: DC

## 2020-05-04 MED ORDER — IBUPROFEN 800 MG PO TABS: 800.0000 mg | ORAL_TABLET | Freq: Three times a day (TID) | ORAL | 5 refills | Status: DC | PRN

## 2020-05-04 NOTE — Progress Notes (Signed)
Subjective:        [name] [name] is a 29 y.o. female here for a routine exam.  Current complaints: Vaginal discharge with odor.    Personal health questionnaire:  Is patient Ashkenazi Jewish, have a family history of breast and/or ovarian cancer: no Is there a family history of uterine cancer diagnosed at age < 49, gastrointestinal cancer, urinary tract cancer, family member who is a Personnel officer syndrome-associated carrier: no Is the patient overweight and hypertensive, family history of diabetes, personal history of gestational diabetes, preeclampsia or PCOS: no Is patient over 70, have PCOS,  family history of premature CHD under age 46, diabetes, smoke, have hypertension or peripheral artery disease:  no At any time, has a partner hit, kicked or otherwise hurt or frightened you?: no Over the past 2 weeks, have you felt down, depressed or hopeless?: no Over the past 2 weeks, have you felt little interest or pleasure in doing things?:no   Gynecologic History No LMP recorded. Patient has had an injection. Contraception: Depo-Provera injections Last Pap: 2018. Results were: normal Last mammogram: n/a. Results were: n/a  Obstetric History OB History  Gravida Para Term Preterm AB Living  4 4 3 1  0 4  SAB TAB Ectopic Multiple Live Births  0 0 0 0 4    # Outcome Date GA Lbr Len/2nd Weight Sex Delivery Anes PTL Lv  4 Preterm 08/12/17 [redacted]w[redacted]d 02:02 / 00:09 4 lb 15.5 oz (2.255 kg) M Vag-Spont EPI  LIV  3 Term 08/24/16 [redacted]w[redacted]d 05:54 / 00:21 5 lb 8.5 oz (2.509 kg) F Vag-Spont EPI  LIV  2 Term 12/14/13 [redacted]w[redacted]d 10:03 / 00:08 6 lb 4.5 oz (2.85 kg) F Vag-Spont EPI  LIV  1 Term 07/03/12 [redacted]w[redacted]d 33:31 / 00:09 5 lb 7.1 oz (2.469 kg) F Vag-Spont EPI  LIV    Past Medical History:  Diagnosis Date  . Anemia   . Anxiety   . Chlamydia 10-2011  . Cholecystitis   . Gestational hypertension   . Headache(784.0)   . Heart palpitations   . Hypertension     Past Surgical History:  Procedure Laterality Date   . ORIF ANKLE FRACTURE Left 02/12/2016   Procedure: OPEN REDUCTION INTERNAL FIXATION (ORIF) ANKLE FRACTURE;  Surgeon: 04/13/2016, MD;  Location: [facility] OR;  Service: Orthopedics;  Laterality: Left;     Current Outpatient Medications:  .  baclofen (LIORESAL) 10 MG tablet, Take 1 tablet (10 mg total) by mouth 3 (three) times daily. (Patient not taking: Reported on 07/18/2018), Disp: 30 each, Rfl: 1 .  butalbital-acetaminophen-caffeine (ESGIC) 50-325-40 MG tablet, Take 2 tablets by mouth every 6 (six) hours as needed for headache. (Patient not taking: Reported on 07/18/2018), Disp: 30 tablet, Rfl: 2 .  carvedilol (COREG) 6.25 MG tablet, Take 1 tablet (6.25 mg total) by mouth 2 (two) times daily with a meal. (Patient not taking: Reported on 07/18/2018), Disp: 60 tablet, Rfl: 11 .  ibuprofen (ADVIL) 800 MG tablet, Take 1 tablet (800 mg total) by mouth every 8 (eight) hours as needed., Disp: 30 tablet, Rfl: 5 .  medroxyPROGESTERone (DEPO-PROVERA) 150 MG/ML injection, Inject 1 mL (150 mg total) into the muscle every 3 (three) months. (Patient not taking: Reported on 02/13/2019), Disp: 1 mL, Rfl: 0 .  medroxyPROGESTERone (DEPO-PROVERA) 150 MG/ML injection, Inject 1 mL (150 mg total) into the muscle every 3 (three) months., Disp: 1 mL, Rfl: 3 .  polyethylene glycol powder (MIRALAX) powder, Take 17 g by mouth daily. (Patient not taking: Reported  on 02/13/2019), Disp: 255 g, Rfl: 0 .  SUMAtriptan (IMITREX) 100 MG tablet, Take 1 tablet (100 mg total) by mouth once as needed for up to 1 dose for migraine. May repeat in 2 hours if headache persists or recurs. (Patient not taking: Reported on 07/18/2018), Disp: 9 tablet, Rfl: 3 .  tinidazole (TINDAMAX) 500 MG tablet, Take 2 tablets (1,000 mg total) by mouth daily with breakfast., Disp: 10 tablet, Rfl: 2 .  topiramate (TOPAMAX) 25 MG tablet, Take 3 tablets (75 mg total) by mouth daily. (Patient not taking: Reported on 07/18/2018), Disp: 90 tablet, Rfl: 2  Current  Facility-Administered Medications:  .  medroxyPROGESTERone (DEPO-PROVERA) injection 150 mg, 150 mg, Intramuscular, Q90 days, Adam Phenix, MD, 150 mg at 01/28/20 1037 Allergies  Allergen Reactions  . Metronidazole Nausea And Vomiting    Social History   Tobacco Use  . Smoking status: Current Every Day Smoker    Packs/day: 0.25    Years: 3.00    Pack years: 0.75    Types: Cigarettes  . Smokeless tobacco: Never Used  Substance Use Topics  . Alcohol use: No    Comment: socially    Family History  Problem Relation Age of Onset  . Diabetes Maternal Grandmother   . Hypertension Maternal Grandmother   . Cancer Maternal Grandmother   . Hypertension Mother   . Anesthesia problems Neg Hx   . Other Neg Hx       Review of Systems  Constitutional: negative for fatigue and weight loss Respiratory: negative for cough and wheezing Cardiovascular: negative for chest pain, fatigue and palpitations Gastrointestinal: negative for abdominal pain and change in bowel habits Musculoskeletal:negative for myalgias Neurological: negative for gait problems and tremors Behavioral/Psych: negative for abusive relationship, depression Endocrine: negative for temperature intolerance    Genitourinary:negative for abnormal menstrual periods, genital lesions, hot flashes, sexual problems. Positive for malodorous vaginal discharge Integument/breast: negative for breast lump, breast tenderness, nipple discharge and skin lesion(s)    Objective:       BP (!) 147/87   Pulse 85   Ht 5\' 6"  (1.676 m)   Wt (!) 224 lb (101.6 kg)   BMI 36.15 kg/m  General:   alert and no distress  Skin:   no rash or abnormalities  Lungs:   clear to auscultation bilaterally  Heart:   regular rate and rhythm, S1, S2 normal, no murmur, click, rub or gallop  Breasts:   normal without suspicious masses, skin or nipple changes or axillary nodes  Abdomen:  normal findings: no organomegaly, soft, non-tender and no hernia   Pelvis:  External genitalia: normal general appearance Urinary system: urethral meatus normal and bladder without fullness, nontender Vaginal: normal without tenderness, induration or masses Cervix: normal appearance Adnexa: normal bimanual exam Uterus: anteverted and non-tender, normal size   Lab Review Urine pregnancy test Labs reviewed yes Radiologic studies reviewed no  50% of 20 min visit spent on counseling and coordination of care.   Assessment:     1. Encounter for gynecological examination with Papanicolaou smear of cervix Rx: - Cytology - PAP( [place])  2. Vaginal discharge Rx: - Cervicovaginal ancillary only  3. BV (bacterial vaginosis) Rx: - tinidazole (TINDAMAX) 500 MG tablet; Take 2 tablets (1,000 mg total) by mouth daily with breakfast.  Dispense: 10 tablet; Refill: 2  4. Dysmenorrhea Rx: - ibuprofen (ADVIL) 800 MG tablet; Take 1 tablet (800 mg total) by mouth every 8 (eight) hours as needed.  Dispense: 30 tablet; Refill: 5  5. Obesity (BMI 35.0-39.9 without comorbidity) - program of caloric reduction, exercise and behavioral modification recommwnded  6. Encounter for initial prescription of injectable contraceptive Rx: - medroxyPROGESTERone (DEPO-PROVERA) 150 MG/ML injection; Inject 1 mL (150 mg total) into the muscle every 3 (three) months.  Dispense: 1 mL; Refill: 3  7. Encounter for other general counseling or advice on contraception - wants Depo Provera  8. Tobacco dependence - cessation with the aid of medication and behavioral modification recommended    Plan:    Education reviewed: calcium supplements, depression evaluation, low fat, low cholesterol diet, safe sex/STD prevention, self breast exams, smoking cessation and weight bearing exercise. Contraception: Depo-Provera injections. Follow up in: 1 year.   Meds ordered this encounter  Medications  . tinidazole (TINDAMAX) 500 MG tablet    Sig: Take 2 tablets (1,000 mg total) by mouth  daily with breakfast.    Dispense:  10 tablet    Refill:  2  . ibuprofen (ADVIL) 800 MG tablet    Sig: Take 1 tablet (800 mg total) by mouth every 8 (eight) hours as needed.    Dispense:  30 tablet    Refill:  5  . medroxyPROGESTERone (DEPO-PROVERA) 150 MG/ML injection    Sig: Inject 1 mL (150 mg total) into the muscle every 3 (three) months.    Dispense:  1 mL    Refill:  3    Pt will need annual exam to continue refills     Brock Bad, MD 05/04/2020 11:26 AM

## 2020-05-04 NOTE — Progress Notes (Signed)
Pt is in the office for [facility] consult. Last pap 04-10-17 Pt previously on depo, wants to switch to the patch.

## 2020-05-05 ENCOUNTER — Other Ambulatory Visit: Payer: Self-pay | Admitting: Obstetrics

## 2020-05-05 LAB — CERVICOVAGINAL ANCILLARY ONLY
Bacterial Vaginitis (gardnerella): POSITIVE — AB
Candida Glabrata: NEGATIVE
Candida Vaginitis: NEGATIVE
Chlamydia: NEGATIVE
Comment: NEGATIVE
Comment: NEGATIVE
Comment: NEGATIVE
Comment: NEGATIVE
Comment: NEGATIVE
Comment: NORMAL
Neisseria Gonorrhea: NEGATIVE
Trichomonas: NEGATIVE

## 2020-05-05 LAB — CYTOLOGY - PAP: Diagnosis: NEGATIVE

## 2020-05-06 ENCOUNTER — Other Ambulatory Visit: Payer: Self-pay | Admitting: Obstetrics

## 2020-05-06 ENCOUNTER — Ambulatory Visit: Payer: Medicaid Other

## 2020-05-06 DIAGNOSIS — Z30013 Encounter for initial prescription of injectable contraceptive: Secondary | ICD-10-CM

## 2020-05-07 ENCOUNTER — Ambulatory Visit: Payer: Medicaid Other

## 2020-05-11 ENCOUNTER — Ambulatory Visit: Payer: Medicaid Other

## 2020-05-22 ENCOUNTER — Encounter (HOSPITAL_COMMUNITY): Payer: Self-pay

## 2020-05-22 ENCOUNTER — Ambulatory Visit (HOSPITAL_COMMUNITY)
Admission: EM | Admit: 2020-05-22 | Discharge: 2020-05-22 | Disposition: A | Discharge status: Home / Self Care | Payer: Medicaid Other | Attending: Family Medicine | Admitting: Family Medicine

## 2020-05-22 ENCOUNTER — Other Ambulatory Visit: Payer: Self-pay

## 2020-05-22 DIAGNOSIS — Z881 Allergy status to other antibiotic agents status: Secondary | ICD-10-CM | POA: Insufficient documentation

## 2020-05-22 DIAGNOSIS — Z8249 Family history of ischemic heart disease and other diseases of the circulatory system: Secondary | ICD-10-CM | POA: Diagnosis not present

## 2020-05-22 DIAGNOSIS — J069 Acute upper respiratory infection, unspecified: Secondary | ICD-10-CM | POA: Diagnosis not present

## 2020-05-22 DIAGNOSIS — Z8759 Personal history of other complications of pregnancy, childbirth and the puerperium: Secondary | ICD-10-CM | POA: Insufficient documentation

## 2020-05-22 DIAGNOSIS — F17210 Nicotine dependence, cigarettes, uncomplicated: Secondary | ICD-10-CM | POA: Diagnosis not present

## 2020-05-22 DIAGNOSIS — U071 COVID-19: Secondary | ICD-10-CM | POA: Diagnosis present

## 2020-05-22 DIAGNOSIS — I10 Essential (primary) hypertension: Secondary | ICD-10-CM | POA: Diagnosis not present

## 2020-05-22 DIAGNOSIS — Z793 Long term (current) use of hormonal contraceptives: Secondary | ICD-10-CM | POA: Diagnosis not present

## 2020-05-22 NOTE — ED Triage Notes (Signed)
Pt c/o body aches, chills and fatigue starting today. Unknown exposures, not vaccinated

## 2020-05-22 NOTE — ED Provider Notes (Signed)
[facility]    CSN: [id number] Arrival date & time: 05/22/20  1742      History   Chief Complaint Chief Complaint  Patient presents with  . Generalized Body Aches  . Headache    HPI [name] [name] is a 29 y.o. female.   Patient reports she has had a fever today of 100.7.  Patient complains of a headache.  Patient is here with 2 children who have upper respiratory symptoms.  Patient is turned about Covid.  Patient is not experiencing any shortness of breath  The history is provided by the patient. No language interpreter was used.  Headache   Past Medical History:  Diagnosis Date  . Anemia   . Anxiety   . Chlamydia 10-2011  . Cholecystitis   . Gestational hypertension   . Headache(784.0)   . Heart palpitations   . Hypertension     Patient Active Problem List   Diagnosis Date Noted  . Fetal pericardial effusion affecting management of mother 08/11/2017  . Pre-eclampsia 07/31/2017  . Group B streptococcus urinary tract infection affecting pregnancy 04/24/2017  . Encounter for supervision of normal pregnancy, unspecified, unspecified trimester 04/10/2017  . History of gestational hypertension 04/10/2017  . Short interval between pregnancies affecting pregnancy, antepartum 04/10/2017  . Murmur, cardiac 02/10/2016    Past Surgical History:  Procedure Laterality Date  . ORIF ANKLE FRACTURE Left 02/12/2016   Procedure: OPEN REDUCTION INTERNAL FIXATION (ORIF) ANKLE FRACTURE;  Surgeon: Myrene Galas, MD;  Location: [facility] OR;  Service: Orthopedics;  Laterality: Left;    OB History    Gravida  4   Para  4   Term  3   Preterm  1   AB  0   Living  4     SAB  0   TAB  0   Ectopic  0   Multiple  0   Live Births  4            Home Medications    Prior to Admission medications   Medication Sig Start Date End Date Taking? Authorizing Provider  medroxyPROGESTERone (DEPO-PROVERA) 150 MG/ML injection Inject 1 mL (150 mg total) into the muscle  every 3 (three) months. 05/04/20   Brock Bad, MD  medroxyPROGESTERone (DEPO-PROVERA) 150 MG/ML injection INJECT INTO THE MUSCLE EVERY 3 MONTHS 05/06/20   Brock Bad, MD    Family History Family History  Problem Relation Age of Onset  . Diabetes Maternal Grandmother   . Hypertension Maternal Grandmother   . Cancer Maternal Grandmother   . Hypertension Mother   . Anesthesia problems Neg Hx   . Other Neg Hx     Social History Social History   Tobacco Use  . Smoking status: Current Every Day Smoker    Packs/day: 0.25    Years: 3.00    Pack years: 0.75    Types: Cigarettes  . Smokeless tobacco: Never Used  Vaping Use  . Vaping Use: Never used  Substance Use Topics  . Alcohol use: No    Comment: socially  . Drug use: No     Allergies   Metronidazole   Review of Systems Review of Systems  Neurological: Positive for headaches.  All other systems reviewed and are negative.    Physical Exam Triage Vital Signs ED Triage Vitals  Enc Vitals Group     BP 05/22/20 1921 (!) 150/66     Pulse Rate 05/22/20 1921 (!) 113     Resp 05/22/20  1921 16     Temp 05/22/20 1921 (!) 100.7 F (38.2 C)     Temp src --      SpO2 05/22/20 1921 100 %     Weight --      Height --      Head Circumference --      Peak Flow --      Pain Score 05/22/20 1943 0     Pain Loc --      Pain Edu? --      Excl. in GC? --    No data found.  Updated Vital Signs BP (!) 150/66   Pulse (!) 113   Temp (!) 100.7 F (38.2 C)   Resp 16   LMP  (LMP Unknown)   SpO2 100%   Visual Acuity Right Eye Distance:   Left Eye Distance:   Bilateral Distance:    Right Eye Near:   Left Eye Near:    Bilateral Near:     Physical Exam Vitals and nursing note reviewed.  Constitutional:      Appearance: She is well-developed.  HENT:     Head: Normocephalic.  Cardiovascular:     Rate and Rhythm: Normal rate.  Pulmonary:     Effort: Pulmonary effort is normal.  Abdominal:     General:  There is no distension.  Musculoskeletal:        General: Normal range of motion.     Cervical back: Normal range of motion.  Neurological:     Mental Status: She is alert and oriented to person, place, and time.  Psychiatric:        Mood and Affect: Mood normal.      UC Treatments / Results  Labs (all labs ordered are listed, but only abnormal results are displayed) Labs Reviewed  SARS CORONAVIRUS 2 (TAT 6-24 HRS)    EKG   Radiology No results found.  Procedures Procedures (including critical care time)  Medications Ordered in UC Medications - No data to display  Initial Impression / Assessment and Plan / UC Course  I have reviewed the triage vital signs and the nursing notes.  Pertinent labs & imaging results that were available during my care of the patient were reviewed by me and considered in my medical decision making (see chart for details).     MDM Covid test is pending patient advised Tylenol and quarantine Final Clinical Impressions(s) / UC Diagnoses   Final diagnoses:  Upper respiratory tract infection, unspecified type     Discharge Instructions     Return if any problems. Your covid test is pending   ED Prescriptions    None     PDMP not reviewed this encounter.  An After Visit Summary was printed and given to the patient.    Elson Areas, [place] 05/22/20 2112

## 2020-05-22 NOTE — Discharge Instructions (Signed)
Return if any problems.  Your covid test is pending  °

## 2020-05-23 LAB — SARS CORONAVIRUS 2 (TAT 6-24 HRS): SARS Coronavirus 2: POSITIVE — AB

## 2020-05-24 ENCOUNTER — Telehealth: Payer: Self-pay | Admitting: Adult Health

## 2020-05-24 NOTE — Telephone Encounter (Signed)
Called to discuss monoclonal antibody treatment for COVID 19 infection as patient is in at risk group for complications and or hospitalization from the virus.  Unable to get through on patient phone number.  My chart message sent.    Lillard Anes, NP

## 2021-08-16 ENCOUNTER — Emergency Department (HOSPITAL_COMMUNITY)
Admission: EM | Admit: 2021-08-16 | Discharge: 2021-08-16 | Disposition: A | Discharge status: Home / Self Care | Payer: 59 | Attending: Emergency Medicine | Admitting: Emergency Medicine

## 2021-08-16 ENCOUNTER — Other Ambulatory Visit: Payer: Self-pay

## 2021-08-16 ENCOUNTER — Encounter (HOSPITAL_COMMUNITY): Payer: Self-pay

## 2021-08-16 DIAGNOSIS — F17210 Nicotine dependence, cigarettes, uncomplicated: Secondary | ICD-10-CM | POA: Diagnosis not present

## 2021-08-16 DIAGNOSIS — N939 Abnormal uterine and vaginal bleeding, unspecified: Secondary | ICD-10-CM | POA: Diagnosis present

## 2021-08-16 DIAGNOSIS — N9489 Other specified conditions associated with female genital organs and menstrual cycle: Secondary | ICD-10-CM | POA: Insufficient documentation

## 2021-08-16 DIAGNOSIS — I10 Essential (primary) hypertension: Secondary | ICD-10-CM | POA: Insufficient documentation

## 2021-08-16 LAB — CBC WITH DIFFERENTIAL/PLATELET
Abs Immature Granulocytes: 0.02 10*3/uL (ref 0.00–0.07)
Basophils Absolute: 0.1 10*3/uL (ref 0.0–0.1)
Basophils Relative: 1 %
Eosinophils Absolute: 0.1 10*3/uL (ref 0.0–0.5)
Eosinophils Relative: 2 %
HCT: 38.7 % (ref 36.0–46.0)
Hemoglobin: 12 g/dL (ref 12.0–15.0)
Immature Granulocytes: 0 %
Lymphocytes Relative: 41 %
Lymphs Abs: 3.2 10*3/uL (ref 0.7–4.0)
MCH: 23 pg — ABNORMAL LOW (ref 26.0–34.0)
MCHC: 31 g/dL (ref 30.0–36.0)
MCV: 74.3 fL — ABNORMAL LOW (ref 80.0–100.0)
Monocytes Absolute: 0.6 10*3/uL (ref 0.1–1.0)
Monocytes Relative: 8 %
Neutro Abs: 3.8 10*3/uL (ref 1.7–7.7)
Neutrophils Relative %: 48 %
Platelets: 446 10*3/uL — ABNORMAL HIGH (ref 150–400)
RBC: 5.21 MIL/uL — ABNORMAL HIGH (ref 3.87–5.11)
RDW: 14.6 % (ref 11.5–15.5)
WBC: 7.8 10*3/uL (ref 4.0–10.5)
nRBC: 0 % (ref 0.0–0.2)

## 2021-08-16 LAB — COMPREHENSIVE METABOLIC PANEL
ALT: 13 U/L (ref 0–44)
AST: 14 U/L — ABNORMAL LOW (ref 15–41)
Albumin: 4 g/dL (ref 3.5–5.0)
Alkaline Phosphatase: 42 U/L (ref 38–126)
Anion gap: 8 (ref 5–15)
BUN: 7 mg/dL (ref 6–20)
CO2: 24 mmol/L (ref 22–32)
Calcium: 9.1 mg/dL (ref 8.9–10.3)
Chloride: 107 mmol/L (ref 98–111)
Creatinine, Ser: 0.58 mg/dL (ref 0.44–1.00)
GFR, Estimated: >60 mL/min (ref >60)
Glucose, Bld: 82 mg/dL (ref 70–99)
Potassium: 3.8 mmol/L (ref 3.5–5.1)
Sodium: 139 mmol/L (ref 135–145)
Total Bilirubin: 0.7 mg/dL (ref 0.3–1.2)
Total Protein: 8.3 g/dL — ABNORMAL HIGH (ref 6.5–8.1)

## 2021-08-16 LAB — I-STAT BETA HCG BLOOD, ED (MC, WL, AP ONLY): I-stat hCG, quantitative: <5 m[IU]/mL (ref <5)

## 2021-08-16 MED ORDER — SODIUM CHLORIDE 0.9 % IV BOLUS: 1000.0000 mL | Freq: Once | INTRAVENOUS | Status: DC

## 2021-08-16 NOTE — ED Provider Notes (Signed)
[facility] DEPT Provider Note   CSN: [id number] Arrival date & time: 08/16/21  1632     History Chief Complaint  Patient presents with   Vaginal Bleeding    [name] [name] is a 30 y.o. female with past medical history of hypertension, who presents today for evaluation of vaginal bleeding. She states that 8 days ago she started having vaginal bleeding.  She had taken a Plan B earlier that day after she had sexual contact that morning.  She states that she had low back pain at work the next day.  Normally her menstrual cycles are about 5 days long.  She has intermittently had clots during this cycle and states that the bleeding is waxing and waning. She denies any fevers.  No dysuria.  She has pain in her entire lower back.  She denies any urinary frequency or urgency.  Prior to the bleeding she denied any abnormal vaginal discharge. She does note that last month she took a Plan B also and her menstrual cycle only lasted about 2 days and was very light compared to usual.  She denies any orthostatic changes or lightheadedness.  She does not take any blood thinning medications.  She has no history of similar.  No prior abdominal surgeries.    She did not have any pain with intercourse prior to the bleeding starting and notes that it was at least 10 hours after that she started bleeding and cramping.  She states that the pain feels like her usual pelvic pain with a cycle.  She reports her pain is both lower quadrants.   HPI     Past Medical History:  Diagnosis Date   Anemia    Anxiety    Chlamydia 10-2011   Cholecystitis    Gestational hypertension    Headache(784.0)    Heart palpitations    Hypertension     Patient Active Problem List   Diagnosis Date Noted   Fetal pericardial effusion affecting management of mother 08/11/2017   Pre-eclampsia 07/31/2017   Group B streptococcus urinary tract infection affecting pregnancy 04/24/2017   Encounter for  supervision of normal pregnancy, unspecified, unspecified trimester 04/10/2017   History of gestational hypertension 04/10/2017   Short interval between pregnancies affecting pregnancy, antepartum 04/10/2017   Murmur, cardiac 02/10/2016    Past Surgical History:  Procedure Laterality Date   ORIF ANKLE FRACTURE Left 02/12/2016   Procedure: OPEN REDUCTION INTERNAL FIXATION (ORIF) ANKLE FRACTURE;  Surgeon: Altamese Calumet, MD;  Location: [facility];  Service: Orthopedics;  Laterality: Left;     OB History     Gravida  4   Para  4   Term  3   Preterm  1   AB  0   Living  4      SAB  0   IAB  0   Ectopic  0   Multiple  0   Live Births  4           Family History  Problem Relation Age of Onset   Diabetes Maternal Grandmother    Hypertension Maternal Grandmother    Cancer Maternal Grandmother    Hypertension Mother    Anesthesia problems Neg Hx    Other Neg Hx     Social History   Tobacco Use   Smoking status: Every Day    Packs/day: 0.25    Years: 3.00    Pack years: 0.75    Types: Cigarettes   Smokeless tobacco: Never  Vaping  Use   Vaping Use: Never used  Substance Use Topics   Alcohol use: No    Comment: socially   Drug use: No    Home Medications Prior to Admission medications   Medication Sig Start Date End Date Taking? Authorizing Provider  medroxyPROGESTERone (DEPO-PROVERA) 150 MG/ML injection Inject 1 mL (150 mg total) into the muscle every 3 (three) months. 05/04/20   Shelly Bombard, MD  medroxyPROGESTERone (DEPO-PROVERA) 150 MG/ML injection INJECT 1ML INTO THE MUSCLE EVERY 3 MONTHS 05/06/20   Shelly Bombard, MD    Allergies    Metronidazole  Review of Systems   Review of Systems  Constitutional:  Negative for chills, fatigue and fever.  Respiratory:  Negative for cough, chest tightness and shortness of breath.   Cardiovascular:  Negative for chest pain.  Gastrointestinal:  Positive for abdominal pain. Negative for constipation,  diarrhea, nausea and vomiting.  Genitourinary:  Positive for menstrual problem, pelvic pain and vaginal bleeding. Negative for dysuria, flank pain, frequency, hematuria, urgency and vaginal discharge.  Musculoskeletal:  Positive for back pain. Negative for neck pain.  Skin:  Negative for color change and rash.  Neurological:  Negative for weakness, light-headedness and headaches.  Psychiatric/Behavioral:  Negative for confusion.   All other systems reviewed and are negative.  Physical Exam Updated Vital Signs BP 128/65   Pulse 74   Temp 98 F (36.7 C)   Resp 16   SpO2 100%   Physical Exam Vitals and nursing note reviewed.  Constitutional:      General: She is not in acute distress.    Appearance: She is not ill-appearing or diaphoretic.  HENT:     Head: Normocephalic and atraumatic.  Eyes:     General: No scleral icterus.       Right eye: No discharge.        Left eye: No discharge.     Conjunctiva/sclera: Conjunctivae normal.  Cardiovascular:     Rate and Rhythm: Normal rate and regular rhythm.  Pulmonary:     Effort: Pulmonary effort is normal. No respiratory distress.     Breath sounds: No stridor.  Abdominal:     General: There is no distension.     Tenderness: There is abdominal tenderness (Mild, bilateral lower abdomen and suprapubic). There is no right CVA tenderness, left CVA tenderness, guarding or rebound.  Genitourinary:    Comments: deferred Musculoskeletal:        General: No deformity.     Cervical back: Normal range of motion.  Skin:    General: Skin is warm and dry.  Neurological:     Mental Status: She is alert.     Motor: No abnormal muscle tone.  Psychiatric:        Behavior: Behavior normal.    ED Results / Procedures / Treatments   Labs (all labs ordered are listed, but only abnormal results are displayed) Labs Reviewed  COMPREHENSIVE METABOLIC PANEL - Abnormal; Notable for the following components:      Result Value   Total Protein 8.3  (*)    AST 14 (*)    All other components within normal limits  CBC WITH DIFFERENTIAL/PLATELET - Abnormal; Notable for the following components:   RBC 5.21 (*)    MCV 74.3 (*)    MCH 23.0 (*)    Platelets 446 (*)    All other components within normal limits  I-STAT BETA HCG BLOOD, ED ([facility], [facility], [facility] ONLY)    EKG None  Radiology No  results found.  Procedures Procedures   Medications Ordered in ED Medications  sodium chloride 0.9 % bolus 1,000 mL (0 mLs Intravenous Stopped 08/16/21 1906)    ED Course  I have reviewed the triage vital signs and the nursing notes.  Pertinent labs & imaging results that were available during my care of the patient were reviewed by me and considered in my medical decision making (see chart for details).    MDM Rules/Calculators/A&P                          Patient is a 30 year old woman who presents today for evaluation of 8 days of vaginal bleeding.  This started after she had intercourse about 10 to 12 hours before hand and took a Plan B.  She also, of note, had a short and light menstrual cycle the month before because she also took a Plan B.  Patient's pregnancy test is negative.  CBC and CMP show no leukocytosis.  She is not anemic.  CMP is unremarkable.  I had a extensive discussion with patient regarding possible causes of her symptoms, her lab results, and evaluation options. We discussed that given her cycle last month was much lighter and shorter than usual it is possible that, especially with frequently taking Plan B, that she had incomplete shedding of the uterine lining last month which is why she is now having a heavier cycle.  She reports that her pain is symmetric on both sides.  Given that her pregnancy test is negative and she is not anemic, is not tachycardic and afebrile and denies orthostatic type symptoms we discussed options for evaluation.  We discussed role of pelvic exam.  Patient does not feel like she has possibility of a  tear or a abrasion or other source of vaginal bleeding.  Additionally she states she does not have concern for STI and declined pelvic exam and wet prep/GC testing. We discussed that given the pain she is having and her mild tenderness in her lower abdomen role of imaging including CT scan versus ultrasound.  Patient declined both of these after we discussed risks, benefits, purpose of them and alternatives. She stated that she felt like she had adequate information to make these decisions. I feel that these decisions are reasonable. Patient states that her primary concerns were that she was either pregnant or anemic and as she is neither of those things she wishes for discharge home. Will give OB/GYN follow-up.  I did recommend that she take a iron containing multivitamin.  Return precautions were discussed with patient who states their understanding.  At the time of discharge patient denied any unaddressed complaints or concerns.  Patient is agreeable for discharge home.  Note: Portions of this report may have been transcribed using voice recognition software. Every effort was made to ensure accuracy; however, inadvertent computerized transcription errors may be present   Final Clinical Impression(s) / ED Diagnoses Final diagnoses:  Vaginal bleeding    Rx / DC Orders ED Discharge Orders     None        Cristina Gong, PA-C 08/17/21 Sherrill Raring, MD 08/19/21 0021

## 2021-08-16 NOTE — ED Provider Notes (Signed)
Emergency Medicine Provider Triage Evaluation Note  [name] [name] , a 30 y.o. female  was evaluated in triage.  Pt complains of vaginal bleeding 1 week duration with clots.  Patient reports she took Plan B the same day her bleeding started.  She reports her home pregnancy test was negative.  She does report lightheadedness.  Review of Systems  Positive: Lightheadedness, vaginal bleeding Negative: Fever, abdominal pain  Physical Exam  BP (!) 145/86   Pulse 80   Temp 98.2 F (36.8 C) (Oral)   Resp 18   SpO2 99%  Gen:   Awake, no distress   Resp:  Normal effort  MSK:   Moves extremities without difficulty  Other:    Medical Decision Making  Medically screening exam initiated at 5:01 PM.  Appropriate orders placed.  Ambrea Hegler was informed that the remainder of the evaluation will be completed by another provider, this initial triage assessment does not replace that evaluation, and the importance of remaining in the ED until their evaluation is complete.     Marita Kansas, PA-C 08/16/21 1702    Bethann Berkshire, MD 08/20/21 [id number]

## 2021-08-16 NOTE — Discharge Instructions (Signed)
Today your pregnancy test was negative.  We discussed that there blood work was reassuring and you are not anemic.  We discussed options for additional evaluation including pelvic exam, STI testing, and imaging with either ultrasound or CAT scan, both of which you declined. If you develop fevers, worsening pain, the pain becomes significantly worse on one side versus the other, you develop any urinary symptoms, are unable to tolerate food or drink, or are continuing to have heavy bleeding please seek additional medical care and evaluation. I would recommend calling the [facility] tomorrow to set up an appointment.  Please tell them that you are an emergency room follow-up.  Please also discuss options for birth control with them.

## 2021-08-16 NOTE — ED Triage Notes (Signed)
Pt states she took Plan B approx Oct 10, started her period Oct 12. Pt states she took plan B on Nov 6. Pt states she started bleeding Nov 6 night and was having clots come out Nov 8. Pt c/o back pain and bleeding since Nov 8. Pt states its been heavy bleeding.

## 2021-08-17 ENCOUNTER — Telehealth: Payer: Self-pay

## 2021-08-17 NOTE — Telephone Encounter (Signed)
Transition Care Management Follow-up Telephone Call Date of discharge and from where: 08/16/2021-  How have you been since you were released from the hospital? Patient stated she is doing ok.  Any questions or concerns? No  Items Reviewed: Did the pt receive and understand the discharge instructions provided? Yes  Medications obtained and verified?  No new medicatins given at discharge  Other? No  Any new allergies since your discharge? No  Dietary orders reviewed? No Do you have support at home? Yes   Home Care and Equipment/Supplies: Were home health services ordered? not applicable If so, what is the name of the agency? N/A  Has the agency set up a time to come to the patient's home? not applicable Were any new equipment or medical supplies ordered?  No What is the name of the medical supply agency? N/A Were you able to get the supplies/equipment? not applicable Do you have any questions related to the use of the equipment or supplies? No  Functional Questionnaire: (I = Independent and D = Dependent) ADLs: I  Bathing/Dressing- I  Meal Prep- I  Eating- I  Maintaining continence- I  Transferring/Ambulation- I  Managing Meds- I  Follow up appointments reviewed:  PCP Hospital f/u appt confirmed? No   Specialist Hospital f/u appt confirmed? No   Are transportation arrangements needed? No  If their condition worsens, is the pt aware to call PCP or go to the Emergency Dept.? Yes Was the patient provided with contact information for the PCP's office or ED? Yes Was to pt encouraged to call back with questions or concerns? Yes

## 2021-11-08 ENCOUNTER — Encounter (HOSPITAL_COMMUNITY): Payer: Self-pay

## 2021-11-08 ENCOUNTER — Emergency Department (HOSPITAL_COMMUNITY): Payer: 59

## 2021-11-08 ENCOUNTER — Emergency Department (HOSPITAL_COMMUNITY)
Admission: EM | Admit: 2021-11-08 | Discharge: 2021-11-08 | Disposition: A | Discharge status: Home / Self Care | Payer: 59 | Attending: Emergency Medicine | Admitting: Emergency Medicine

## 2021-11-08 ENCOUNTER — Other Ambulatory Visit: Payer: Self-pay

## 2021-11-08 DIAGNOSIS — S8990XA Unspecified injury of unspecified lower leg, initial encounter: Secondary | ICD-10-CM

## 2021-11-08 DIAGNOSIS — W010XXA Fall on same level from slipping, tripping and stumbling without subsequent striking against object, initial encounter: Secondary | ICD-10-CM | POA: Diagnosis not present

## 2021-11-08 DIAGNOSIS — S82201A Unspecified fracture of shaft of right tibia, initial encounter for closed fracture: Secondary | ICD-10-CM | POA: Diagnosis not present

## 2021-11-08 DIAGNOSIS — S82831A Other fracture of upper and lower end of right fibula, initial encounter for closed fracture: Secondary | ICD-10-CM

## 2021-11-08 DIAGNOSIS — Y9301 Activity, walking, marching and hiking: Secondary | ICD-10-CM | POA: Insufficient documentation

## 2021-11-08 DIAGNOSIS — Z20822 Contact with and (suspected) exposure to covid-19: Secondary | ICD-10-CM | POA: Insufficient documentation

## 2021-11-08 DIAGNOSIS — S82401A Unspecified fracture of shaft of right fibula, initial encounter for closed fracture: Secondary | ICD-10-CM | POA: Insufficient documentation

## 2021-11-08 DIAGNOSIS — W19XXXA Unspecified fall, initial encounter: Secondary | ICD-10-CM

## 2021-11-08 DIAGNOSIS — S82141A Displaced bicondylar fracture of right tibia, initial encounter for closed fracture: Secondary | ICD-10-CM

## 2021-11-08 DIAGNOSIS — S8991XA Unspecified injury of right lower leg, initial encounter: Secondary | ICD-10-CM | POA: Diagnosis present

## 2021-11-08 LAB — CBC WITH DIFFERENTIAL/PLATELET
Abs Immature Granulocytes: 0.04 10*3/uL (ref 0.00–0.07)
Basophils Absolute: 0 10*3/uL (ref 0.0–0.1)
Basophils Relative: 0 %
Eosinophils Absolute: 0 10*3/uL (ref 0.0–0.5)
Eosinophils Relative: 0 %
HCT: 35.9 % — ABNORMAL LOW (ref 36.0–46.0)
Hemoglobin: 11.4 g/dL — ABNORMAL LOW (ref 12.0–15.0)
Immature Granulocytes: 0 %
Lymphocytes Relative: 28 %
Lymphs Abs: 3.1 10*3/uL (ref 0.7–4.0)
MCH: 23 pg — ABNORMAL LOW (ref 26.0–34.0)
MCHC: 31.8 g/dL (ref 30.0–36.0)
MCV: 72.4 fL — ABNORMAL LOW (ref 80.0–100.0)
Monocytes Absolute: 0.7 10*3/uL (ref 0.1–1.0)
Monocytes Relative: 6 %
Neutro Abs: 7.2 10*3/uL (ref 1.7–7.7)
Neutrophils Relative %: 66 %
Platelets: 425 10*3/uL — ABNORMAL HIGH (ref 150–400)
RBC: 4.96 MIL/uL (ref 3.87–5.11)
RDW: 14.7 % (ref 11.5–15.5)
WBC: 11 10*3/uL — ABNORMAL HIGH (ref 4.0–10.5)
nRBC: 0 % (ref 0.0–0.2)

## 2021-11-08 LAB — BASIC METABOLIC PANEL
Anion gap: 7 (ref 5–15)
BUN: 7 mg/dL (ref 6–20)
CO2: 22 mmol/L (ref 22–32)
Calcium: 8.8 mg/dL — ABNORMAL LOW (ref 8.9–10.3)
Chloride: 106 mmol/L (ref 98–111)
Creatinine, Ser: 0.55 mg/dL (ref 0.44–1.00)
GFR, Estimated: >60 mL/min (ref >60)
Glucose, Bld: 98 mg/dL (ref 70–99)
Potassium: 3.6 mmol/L (ref 3.5–5.1)
Sodium: 135 mmol/L (ref 135–145)

## 2021-11-08 LAB — I-STAT BETA HCG BLOOD, ED (MC, WL, AP ONLY): I-stat hCG, quantitative: <5 m[IU]/mL (ref <5)

## 2021-11-08 LAB — RESP PANEL BY RT-PCR (FLU A&B, COVID) ARPGX2
Influenza A by PCR: NEGATIVE
Influenza B by PCR: NEGATIVE
SARS Coronavirus 2 by RT PCR: NEGATIVE

## 2021-11-08 MED ORDER — HYDROMORPHONE HCL 1 MG/ML IJ SOLN
1.0000 mg | Freq: Once | INTRAMUSCULAR | Status: AC
  Administered 2021-11-08: 1 mg via INTRAVENOUS
  Filled 2021-11-08: qty 1

## 2021-11-08 MED ORDER — OXYCODONE-ACETAMINOPHEN 5-325 MG PO TABS
1.0000 | ORAL_TABLET | Freq: Once | ORAL | Status: AC
  Administered 2021-11-08: 1 via ORAL
  Filled 2021-11-08: qty 1

## 2021-11-08 MED ORDER — IBUPROFEN 800 MG PO TABS: 800.0000 mg | ORAL_TABLET | Freq: Three times a day (TID) | ORAL | 0 refills | Status: DC

## 2021-11-08 MED ORDER — OXYCODONE-ACETAMINOPHEN 5-325 MG PO TABS: 1.0000 | ORAL_TABLET | Freq: Four times a day (QID) | ORAL | 0 refills | Status: DC | PRN

## 2021-11-08 MED ORDER — MORPHINE SULFATE (PF) 4 MG/ML IV SOLN
4.0000 mg | Freq: Once | INTRAVENOUS | Status: AC
  Administered 2021-11-08: 4 mg via INTRAVENOUS
  Filled 2021-11-08: qty 1

## 2021-11-08 MED ORDER — FENTANYL CITRATE PF 50 MCG/ML IJ SOSY
50.0000 ug | PREFILLED_SYRINGE | Freq: Once | INTRAMUSCULAR | Status: AC
  Administered 2021-11-08: 50 ug via INTRAVENOUS
  Filled 2021-11-08: qty 1

## 2021-11-08 NOTE — TOC Transition Note (Signed)
RNCM spoke with patient's mother, [name] [name] to advise wheelchair will be delivered by [employer]. Address and phone# confirmed.  RNCM spoke Shelia at [employer] to request wheelchair be sent to patient's residence and not delivered to room. [employer] has confirmed and will set up wheelchair home delivery. No additional TOC needs at this time.

## 2021-11-08 NOTE — Progress Notes (Signed)
Orthopedic Tech Progress Note Patient Details:  [name] [name] [birth year] [id number]  Ortho Devices Type of Ortho Device: Knee Immobilizer Ortho Device/Splint Location: right Ortho Device/Splint Interventions: Application   Post Interventions Patient Tolerated: Well Instructions Provided: Care of device  Saul Fordyce 11/08/2021, 10:58 AM

## 2021-11-08 NOTE — ED Triage Notes (Signed)
Per EMS- Patient repors that she was walking and tripped over a brick. Patient has pain and swelling to the right knee and an abrasion to the right palm. No LOC. No neck/back/head injury. No blood thinners.  Patient was given Fentanyl 200 mcg IV prior to coming to the ED.

## 2021-11-08 NOTE — ED Notes (Signed)
Ortho tech at bedside 

## 2021-11-08 NOTE — Discharge Instructions (Addendum)
Please pick up medications and take as prescribed. I would recommend alternating the medication every 4 hours for pain.   Use crutches as provided and avoid  baring weight onto the leg. While at home please rest, ice, and elevate your leg to help reduce pain/swelling.   Call Dr. Donnie Mesa office to schedule an appointment for further eval.   Return to the ED for any new/worsening symptoms

## 2021-11-08 NOTE — ED Provider Notes (Addendum)
[facility]-EMERGENCY DEPT Provider Note   CSN: [id number] Arrival date & time: 11/08/21  5188     History  Chief Complaint  Patient presents with   Fall   Knee Injury    [name] [name] is a 31 y.o. female who presents to the ED today via EMS with complaint of mechanical fall that occurred earlier today.  Patient states that she was walking when she tripped over a loose brick causing her to fall.  She states she fell onto her right leg and has been having significant pain in her right knee since that time.  She was provided 200 mcg fentanyl in route however still continues to complain of pain. Denies any head injury or LOC. No other complaints at this time.   The history is provided by the patient and medical records.      Home Medications Prior to Admission medications   Medication Sig Start Date End Date Taking? Authorizing Provider  ibuprofen (ADVIL) 800 MG tablet Take 1 tablet (800 mg total) by mouth 3 (three) times daily. 11/08/21  Yes Iszabella Hebenstreit, PA-C  oxyCODONE-acetaminophen (PERCOCET/ROXICET) 5-325 MG tablet Take 1 tablet by mouth every 6 (six) hours as needed for severe pain. 11/08/21  Yes Jenny Omdahl, PA-C  medroxyPROGESTERone (DEPO-PROVERA) 150 MG/ML injection Inject 1 mL (150 mg total) into the muscle every 3 (three) months. 05/04/20   Brock Bad, MD  medroxyPROGESTERone (DEPO-PROVERA) 150 MG/ML injection INJECT INTO THE MUSCLE EVERY 3 MONTHS 05/06/20   Brock Bad, MD      Allergies    Metronidazole    Review of Systems   Review of Systems  Constitutional:  Negative for chills and fever.  Musculoskeletal:  Positive for arthralgias.  Neurological:  Negative for syncope and headaches.  All other systems reviewed and are negative.  Physical Exam Updated Vital Signs BP (!) 143/83 (BP Location: Left Arm)    Pulse 80    Temp 98.9 F (37.2 C) (Oral)    Resp 18    Ht 5\' 6"  (1.676 m)    Wt 102.1 kg    LMP 10/29/2021 (Approximate)  Comment: negative HCG blood test 11-08-2021   SpO2 100%    BMI 36.32 kg/m  Physical Exam Vitals and nursing note reviewed.  Constitutional:      Appearance: She is not ill-appearing.     Comments: Moaning in pain  HENT:     Head: Normocephalic and atraumatic.  Eyes:     Conjunctiva/sclera: Conjunctivae normal.  Cardiovascular:     Rate and Rhythm: Normal rate and regular rhythm.  Pulmonary:     Effort: Pulmonary effort is normal.     Breath sounds: Normal breath sounds.  Musculoskeletal:     Comments: RLE in temporary immobilizer placed by EMS. Significant swelling noted to R knee with associated TTP. Unable to assess for ROM given significant pain. No TTP to R hip. 2+ dp pulse. Compartments soft.   Skin:    General: Skin is warm and dry.     Coloration: Skin is not jaundiced.  Neurological:     Mental Status: She is alert.    ED Results / Procedures / Treatments   Labs (all labs ordered are listed, but only abnormal results are displayed) Labs Reviewed  BASIC METABOLIC PANEL - Abnormal; Notable for the following components:      Result Value   Calcium 8.8 (*)    All other components within normal limits  CBC WITH DIFFERENTIAL/PLATELET - Abnormal;  Notable for the following components:   WBC 11.0 (*)    Hemoglobin 11.4 (*)    HCT 35.9 (*)    MCV 72.4 (*)    MCH 23.0 (*)    Platelets 425 (*)    All other components within normal limits  RESP PANEL BY RT-PCR (FLU A&B, COVID) ARPGX2  I-STAT BETA HCG BLOOD, ED ([facility], [facility], [facility] ONLY)    EKG None  Radiology CT Tibia Fibula Right Wo Contrast  Result Date: 11/08/2021 CLINICAL DATA:  Fall, pain. Evaluate proximal tibial and fibular fractures. EXAM: CT OF THE LOWER RIGHT EXTREMITY WITHOUT CONTRAST TECHNIQUE: Multidetector CT imaging of the right lower extremity was performed according to the standard protocol. RADIATION DOSE REDUCTION: This exam was performed according to the departmental dose-optimization program which includes  automated exposure control, adjustment of the mA and/or kV according to patient size and/or use of iterative reconstruction technique. COMPARISON:  X-ray 11/08/2021 FINDINGS: Bones/Joint/Cartilage Acute comminuted fracture of the medial tibial plateau with vertical split component. Fracture results in approximately 2 mm of articular-surface depression. Approximately 6 mm of anterior displacement of the anterior aspect of the medial tibial plateau fracture fragment. No fracture extension to the lateral tibial plateau. Nondisplaced transversely oriented fracture through the fibular head. The patella and visualized distal femur intact without fracture. Large layering knee joint lipohemarthrosis. Ligaments Suboptimally assessed by CT. Muscles and Tendons No acute musculotendinous abnormality by CT. Soft tissues Soft tissue swelling and ill-defined hematoma at the anteromedial aspect of the knee. IMPRESSION: 1. Acute comminuted medial tibial plateau fracture (Schatzker type IV). 2. Nondisplaced transversely oriented fracture through the fibular head. 3. Large knee joint lipohemarthrosis. Electronically Signed   By: Duanne Guess D.O.   On: 11/08/2021 11:32   DG Knee Complete 4 Views Right  Result Date: 11/08/2021 CLINICAL DATA:  Status post fall, pain and swelling EXAM: RIGHT KNEE - COMPLETE 4+ VIEW COMPARISON:  None. FINDINGS: Nondisplaced fracture of the medial tibial plateau extending to the articular surface without significant depression. Nondisplaced fracture of the proximal fibular metaphysis. Large joint effusion. No aggressive osseous lesion. Normal alignment. Soft tissue are unremarkable. No radiopaque foreign body or soft tissue emphysema. IMPRESSION: 1. Nondisplaced fracture of the medial tibial plateau extending to the articular surface without significant depression. 2. Nondisplaced fracture of the proximal fibular metaphysis. Electronically Signed   By: Elige Ko M.D.   On: 11/08/2021 09:24     Procedures Procedures    Medications Ordered in ED Medications  morphine (PF) 4 MG/ML injection 4 mg (4 mg Intravenous Given 11/08/21 0914)  fentaNYL (SUBLIMAZE) injection 50 mcg (50 mcg Intravenous Given 11/08/21 1009)  HYDROmorphone (DILAUDID) injection 1 mg (1 mg Intravenous Given 11/08/21 1147)  oxyCODONE-acetaminophen (PERCOCET/ROXICET) 5-325 MG per tablet 1 tablet (1 tablet Oral Given 11/08/21 1150)    ED Course/ Medical Decision Making/ A&P                           Medical Decision Making 31 year old female who presents to the ED today status post mechanical fall that occurred earlier this morning.  Placed in temporary leg immobilizer by EMS who provided 200 mcg fentanyl in route.  Nursing staff informed me prior to patient being seen that she is complaining of severe pain.  When I enter the room x-rays and they are obtaining x-rays of the knee.  Personally evaluated by myself at bedside it does appear to have positive tibial plateau fracture as well as  fibula fracture.  Additional 4 mg of morphine provided.  After x-ray left patient was evaluated.  She has significant swelling and pain and tenderness palpation to her right knee.  She does have good 2+ DP pulse.  Will await official radiology read however will likely touch base with orthopedics.  Pt requesting more pain medication after 4 mg Morphine. Additional 50 mcg Fentanyl provided without significant improvement in pain. Concern that pt will  be unable to be discharged due to amount of pain she is in. Pending CT scan currently from orthopedist recommendations.    CT scan completed however pt continues to be in significant pain. Do not feel she can be safely discharged at this time and will need admission for pain control. Attending physician Dr. Wilkie Aye evaluated patient as well - recommends dose of IV dilaudid as well as oral pain medication. Will reconsult ortho at this time.  On reevaluation pt appears more comfortable at this time  with IV dilaudid and percocet. I had shared decision making with patient regarding pain control/observation admission vs outpatient follow up with ortho. She prefers to go home at this time. Will discharge with percocet and 800 mg Ibuprofen. Pt in agreement with plan and stable for discharge home.   Amount and/or Complexity of Data Reviewed Labs: ordered. Radiology: ordered.    Details: CT: IMPRESSION:  1. Acute comminuted medial tibial plateau fracture (Schatzker type  IV).  2. Nondisplaced transversely oriented fracture through the fibular  head.  3. Large knee joint lipohemarthrosis. Discussion of management or test interpretation with external provider(s): Discussed case with Dr. Susa Simmonds Orthopedist who recommends CT scan, knee immobilizer, and outpatient follow up.   Re-consulted Dr. Susa Simmonds given uncontrolled pain in the ED. Reviewed CT images with trauma team at [facility]; does not require surgical intervention at this time. If patient requires admission recommend medicine admission. If admitted may proceed with MRI for evaluation of possible ligamentous injury. Otherwise recommends outpatient follow up.   Risk Prescription drug management. Decision regarding hospitalization.    1:24 PM Nursing staff informed pt is requesting wheelchair at this time. Does not feel like crutches are adequate for her. TOC consulted at this time and will have wheelchair delivered.      Final Clinical Impression(s) / ED Diagnoses Final diagnoses:  Knee injury  Fall, initial encounter  Closed fracture of right tibial plateau, initial encounter  Closed fracture of proximal end of right fibula, unspecified fracture morphology, initial encounter    Rx / DC Orders ED Discharge Orders          Ordered    oxyCODONE-acetaminophen (PERCOCET/ROXICET) 5-325 MG tablet  Every 6 hours PRN        11/08/21 1253    ibuprofen (ADVIL) 800 MG tablet  3 times daily        11/08/21 1253             Discharge  Instructions      Please pick up medications and take as prescribed. I would recommend alternating the medication every 4 hours for pain.   Use crutches as provided and avoid  baring weight onto the leg. While at home please rest, ice, and elevate your leg to help reduce pain/swelling.   Call Dr. Donnie Mesa office to schedule an appointment for further eval.   Return to the ED for any new/worsening symptoms        Tanda Rockers, PA-C 11/08/21 1257    Tanda Rockers, PA-C 11/08/21 1326    Horton, Danford Bad  M, DO 11/08/21 1538

## 2021-11-08 NOTE — ED Notes (Signed)
Ortho paged for application of knee immobilizer 

## 2021-11-09 ENCOUNTER — Telehealth: Payer: Self-pay

## 2021-11-09 NOTE — Telephone Encounter (Signed)
Transition Care Management Follow-up Telephone Call Date of discharge and from where: 11/08/2021 from [facility] How have you been since you were released from the hospital? Patient stated that she is feeling sore and in some pain.  Any questions or concerns? No  Items Reviewed: Did the pt receive and understand the discharge instructions provided? Yes  Medications obtained and verified? Yes  Other? No  Any new allergies since your discharge? No  Dietary orders reviewed? No Do you have support at home? Yes  (mother is at home with patient)  Home Care and Equipment/Supplies: Were home health services ordered? no If so, what is the name of the agency?  Has the agency set up a time to come to the patient's home? not applicable Were any new equipment or medical supplies ordered?  Yes: crutches and leg immobilizer What is the name of the medical supply agency? N/a Were you able to get the supplies/equipment? not applicable Do you have any questions related to the use of the equipment or supplies? No  Functional Questionnaire: (I = Independent and D = Dependent) ADLs: D  Bathing/Dressing- D  Meal Prep- D  Eating- D  Maintaining continence- I  Transferring/Ambulation- I  Managing Meds- I   Follow up appointments reviewed:  PCP Hospital f/u appt confirmed? No  Not at this time.  Specialist Hospital f/u appt confirmed? Yes, patient will see Dr. Susa Simmonds tomorrow (Ortho).  Are transportation arrangements needed? No  If their condition worsens, is the pt aware to call PCP or go to the Emergency Dept.? Yes Was the patient provided with contact information for the PCP's office or ED? Yes Was to pt encouraged to call back with questions or concerns? Yes

## 2021-11-23 DIAGNOSIS — S82131A Displaced fracture of medial condyle of right tibia, initial encounter for closed fracture: Secondary | ICD-10-CM | POA: Diagnosis not present

## 2021-11-24 DIAGNOSIS — S82131A Displaced fracture of medial condyle of right tibia, initial encounter for closed fracture: Secondary | ICD-10-CM

## 2021-11-24 HISTORY — DX: Displaced fracture of medial condyle of right tibia, initial encounter for closed fracture: S82.131A

## 2021-11-24 NOTE — H&P (Signed)
Orthopaedic Trauma Service (OTS) H&P  Patient ID: [name] [name] MRN: [id number] DOB/AGE: April 17, 1991 31 y.o.  Reason for surgery: Right medial tibial plateau fracture  HPI: [name] [name] is an 31 y.o. female with no significant past medical history presenting for surgery on right lower extremity.  Patient sustained mechanical fall on 11/08/2021, landing on her right lower extremity.  Had immediate pain in the right knee was unable to weight-bear.  Was seen in the emergency department and found to have a tibial plateau fracture.  Orthopedics was consulted for evaluation and management.  Dr. Susa Simmonds felt this injury was outside the scope of practice and asked orthopedic trauma service to assume care of patient to discuss operative versus nonoperative management.  MRI of the right knee has been performed through [employer] which confirms no involvement of the ACL or meniscus.  Patient presents today for repair of her fracture.  Patient denies any previous injury or surgery to right lower extremity. Had undergone ORIF left ankle fracture by Dr. Carola Frost in 2017. She takes no medications at baseline.  Not currently on any anticoagulation.  Ambulates with no assistive device at baseline. 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  Past Medical History:  Diagnosis Date   Anemia    Anxiety    Chlamydia 10-2011   Cholecystitis    Gestational hypertension    Headache(784.0)    Heart palpitations    Hypertension     Past Surgical History:  Procedure Laterality Date   ORIF ANKLE FRACTURE Left 02/12/2016   Procedure: OPEN REDUCTION INTERNAL FIXATION (ORIF) ANKLE FRACTURE;  Surgeon: Myrene Galas, MD;  Location: [facility] OR;  Service: Orthopedics;  Laterality: Left;    Family History  Problem Relation Age of Onset   Diabetes Maternal Grandmother    Hypertension Maternal Grandmother    Cancer Maternal Grandmother    Hypertension Mother    Anesthesia problems Neg Hx    Other Neg Hx     Social History:   reports that she has been smoking cigarettes. She has a 1.50 pack-year smoking history. She has never used smokeless tobacco. She reports that she does not drink alcohol and does not use drugs.  Allergies:  Allergies  Allergen Reactions   Metronidazole Nausea And Vomiting    Medications: I have reviewed the patient's current medications. Prior to Admission:  No medications prior to admission.    ROS: Constitutional: No fever or chills Vision: No changes in vision ENT: No difficulty swallowing CV: No chest pain Pulm: No SOB or wheezing GI: No nausea or vomiting GU: No urgency or inability to hold urine Skin: No poor wound healing Neurologic: No numbness or tingling Psychiatric: No depression or anxiety Heme: No bruising Allergic: No reaction to medications or food   Exam: Last menstrual period 10/29/2021. General: No acute distress Orientation: Alert and oriented x4 Mood and Affect: Mood and affect appropriate, pleasant and cooperative Gait: Nonweightbearing right lower extremity with knee immobilizer in place Coordination and balance: Within normal limits  Right lower extremity: Knee immobilizer in place.  Swelling about the knee appropriate.  Bruising to the medial knee and proximal tibia.  Tenderness over this area.  No tenderness throughout the lower leg, ankle, foot or through the thigh/hip.  Knee motion not assessed due to pain at rest related to known fracture.  Ankle dorsiflexion and plantarflexion are intact.  Patient neurovascularly intact.  Sensation intact throughout extremity.  Compartments soft and compressible.  Left lower extremity: Healed surgical scar over left ankle but  otherwise skin without lesions. No tenderness to palpation. Full painless ROM, full strength in each muscle group without evidence of instability.  Neurovascularly intact   Medical Decision Making: Data: Imaging: CT scan and x-rays of right knee show medial tibial plateau fracture with 2 mm  of depression.  No involvement of the lateral joint noted.  Labs: No results found for this or any previous visit (from the past 168 hour(s)).   Assessment/Plan: 30 year old female with right medial plateau fracture   Patient with significant due to right lower extremity.  I discussed with the patient and her family operative versus nonoperative management of her fracture.  After discussing risk and benefits of each, patient would like to pursue surgical fixation of her fracture to allow for more reliable healing and early knee range of motion.  Risks discussed included bleeding, infection, malunion, nonunion, damage to surrounding nerves and blood vessels, pain, hardware prominence or irritation, hardware failure, stiffness, post-traumatic arthritis, DVT/PE, compartment syndrome, and even anesthesia complications.  Patient states understanding of these risks and agrees to proceed with surgery.  We will plan to discharge patient home postoperatively and she will be nonweightbearing on the right lower extremity for approximately 6 weeks postop.   Thompson Caul PA-C [facility] [phone] (office) [email or web address]

## 2021-11-25 ENCOUNTER — Other Ambulatory Visit: Payer: Self-pay

## 2021-11-25 ENCOUNTER — Encounter (HOSPITAL_COMMUNITY): Payer: Self-pay | Admitting: Student

## 2021-11-25 NOTE — Progress Notes (Signed)
[name] denies chest pain or shortness of breath. Patient denies having any s/s of Covid in her household.  Patient denies any known exposure to Covid.   Ms [name] [name] does not have a PCP.  Patient has had HTN in the past, "mainly when I was pregnant or gain weight."  Ms [name] [name] reports that she has been told that she has a Heart Murmer, but has never had a cardiac work up, patient does not remember who told her she has a heart murmer. [name] reports that she has heart palpations at times, fist time it happened, I was pregnant.  I found in her records that patient in 2013, had rapid heart rate with palpations.  Ms [name] [name] reports that the last time she had palpations was around Christmas.  Ms [name] [name] reports that palpations start, no matter what she is doing, she becomes a little short of breath and and it feels like her heart is skipping a beat, "it feels like my heart stops then starts back again," Ms [name] [name] reported.   I spoke with Antionette Poles, PA-C; Fayrene Fearing said she will have to be evaluated it am.  I instructed Ms [name] [name] to wash up well in am, with antibiotic soap, if it is available.  Dry off with a clean towel. Do not put lotion, powder, cologne or deodorant or makeup.No jewelry or piercings. Men may shave their face and neck. Woman should not shave. No nail polish, artificial or acrylic nails. Wear clean clothes, brush your teeth. Glasses, contact lens,dentures or partials may not be worn in the OR. If you need to wear them, please bring a case for glasses, do not wear contacts or bring a case, the hospital does not have contact cases, dentures or partials will have to be removed , make sure they are clean, we will provide a denture cup to put them in. You will need some one to drive you home and a responsible person over the age of 18 to stay with you for the first 24 hours after surgery.

## 2021-11-25 NOTE — Anesthesia Preprocedure Evaluation (Addendum)
Anesthesia Evaluation  Patient identified by MRN, date of birth, ID band Patient awake    Reviewed: Allergy & Precautions, NPO status , Patient's Chart, lab work & pertinent test results  History of Anesthesia Complications (+) history of anesthetic complications  Airway Mallampati: II  TM Distance: >3 FB Neck ROM: Full    Dental no notable dental hx. (+) Dental Advisory Given   Pulmonary Current Smoker and Patient abstained from smoking.,    Pulmonary exam normal        Cardiovascular hypertension, Normal cardiovascular exam  Gestational hypertension   Neuro/Psych negative neurological ROS     GI/Hepatic negative GI ROS, Neg liver ROS,   Endo/Other  negative endocrine ROS  Renal/GU negative Renal ROS     Musculoskeletal negative musculoskeletal ROS (+)   Abdominal   Peds  Hematology negative hematology ROS (+)   Anesthesia Other Findings   Reproductive/Obstetrics                            Anesthesia Physical Anesthesia Plan  ASA: 2  Anesthesia Plan: General   Post-op Pain Management: Celebrex PO (pre-op)* and Tylenol PO (pre-op)*   Induction: Intravenous  PONV Risk Score and Plan: 3 and Ondansetron, Dexamethasone, Midazolam and Treatment may vary due to age or medical condition  Airway Management Planned: LMA  Additional Equipment:   Intra-op Plan:   Post-operative Plan: Extubation in OR  Informed Consent: I have reviewed the patients History and Physical, chart, labs and discussed the procedure including the risks, benefits and alternatives for the proposed anesthesia with the patient or authorized representative who has indicated his/her understanding and acceptance.     Dental advisory given  Plan Discussed with: Anesthesiologist and CRNA  Anesthesia Plan Comments:        Anesthesia Quick Evaluation

## 2021-11-26 ENCOUNTER — Ambulatory Visit (HOSPITAL_BASED_OUTPATIENT_CLINIC_OR_DEPARTMENT_OTHER): Payer: 59 | Admitting: Physician Assistant

## 2021-11-26 ENCOUNTER — Other Ambulatory Visit: Payer: Self-pay

## 2021-11-26 ENCOUNTER — Ambulatory Visit: Payer: Self-pay | Admitting: Student

## 2021-11-26 ENCOUNTER — Encounter (HOSPITAL_COMMUNITY)
Admission: RE | Disposition: A | Discharge status: Home / Self Care | Payer: Self-pay | Source: Home / Self Care | Attending: Student

## 2021-11-26 ENCOUNTER — Ambulatory Visit (HOSPITAL_COMMUNITY)
Admission: RE | Admit: 2021-11-26 | Discharge: 2021-11-26 | Disposition: A | Discharge status: Home / Self Care | Payer: 59 | Attending: Student | Admitting: Student

## 2021-11-26 ENCOUNTER — Ambulatory Visit (HOSPITAL_COMMUNITY): Payer: 59 | Admitting: Physician Assistant

## 2021-11-26 ENCOUNTER — Encounter (HOSPITAL_COMMUNITY): Payer: Self-pay | Admitting: Student

## 2021-11-26 ENCOUNTER — Ambulatory Visit (HOSPITAL_COMMUNITY): Payer: 59

## 2021-11-26 DIAGNOSIS — Z419 Encounter for procedure for purposes other than remedying health state, unspecified: Secondary | ICD-10-CM

## 2021-11-26 DIAGNOSIS — S82141A Displaced bicondylar fracture of right tibia, initial encounter for closed fracture: Secondary | ICD-10-CM

## 2021-11-26 DIAGNOSIS — W19XXXA Unspecified fall, initial encounter: Secondary | ICD-10-CM | POA: Diagnosis not present

## 2021-11-26 DIAGNOSIS — F17210 Nicotine dependence, cigarettes, uncomplicated: Secondary | ICD-10-CM | POA: Diagnosis not present

## 2021-11-26 DIAGNOSIS — S82131A Displaced fracture of medial condyle of right tibia, initial encounter for closed fracture: Secondary | ICD-10-CM

## 2021-11-26 DIAGNOSIS — I10 Essential (primary) hypertension: Secondary | ICD-10-CM | POA: Insufficient documentation

## 2021-11-26 HISTORY — PX: ORIF TIBIA PLATEAU: SHX2132

## 2021-11-26 HISTORY — DX: Concussion with loss of consciousness status unknown, initial encounter: S06.0XAA

## 2021-11-26 LAB — CBC
HCT: 34.4 % — ABNORMAL LOW (ref 36.0–46.0)
Hemoglobin: 10.9 g/dL — ABNORMAL LOW (ref 12.0–15.0)
MCH: 22.8 pg — ABNORMAL LOW (ref 26.0–34.0)
MCHC: 31.7 g/dL (ref 30.0–36.0)
MCV: 71.8 fL — ABNORMAL LOW (ref 80.0–100.0)
Platelets: 519 10*3/uL — ABNORMAL HIGH (ref 150–400)
RBC: 4.79 MIL/uL (ref 3.87–5.11)
RDW: 14.1 % (ref 11.5–15.5)
WBC: 10.1 10*3/uL (ref 4.0–10.5)
nRBC: 0 % (ref 0.0–0.2)

## 2021-11-26 LAB — COMPREHENSIVE METABOLIC PANEL
ALT: 30 U/L (ref 0–44)
AST: 20 U/L (ref 15–41)
Albumin: 2.9 g/dL — ABNORMAL LOW (ref 3.5–5.0)
Alkaline Phosphatase: 39 U/L (ref 38–126)
Anion gap: 8 (ref 5–15)
BUN: 7 mg/dL (ref 6–20)
CO2: 22 mmol/L (ref 22–32)
Calcium: 8.8 mg/dL — ABNORMAL LOW (ref 8.9–10.3)
Chloride: 105 mmol/L (ref 98–111)
Creatinine, Ser: 0.67 mg/dL (ref 0.44–1.00)
GFR, Estimated: >60 mL/min (ref >60)
Glucose, Bld: 87 mg/dL (ref 70–99)
Potassium: 3.8 mmol/L (ref 3.5–5.1)
Sodium: 135 mmol/L (ref 135–145)
Total Bilirubin: 0.3 mg/dL (ref 0.3–1.2)
Total Protein: 7.1 g/dL (ref 6.5–8.1)

## 2021-11-26 LAB — SURGICAL PCR SCREEN
MRSA, PCR: NEGATIVE
Staphylococcus aureus: NEGATIVE

## 2021-11-26 LAB — POCT PREGNANCY, URINE: Preg Test, Ur: NEGATIVE

## 2021-11-26 SURGERY — OPEN REDUCTION INTERNAL FIXATION (ORIF) TIBIAL PLATEAU
Anesthesia: General | Site: Leg Upper | Laterality: Right

## 2021-11-26 MED ORDER — ACETAMINOPHEN 500 MG PO TABS
1000.0000 mg | ORAL_TABLET | Freq: Once | ORAL | Status: AC
  Administered 2021-11-26: 1000 mg via ORAL
  Filled 2021-11-26: qty 2

## 2021-11-26 MED ORDER — PROCHLORPERAZINE EDISYLATE 10 MG/2ML IJ SOLN: 10.0000 mg | Freq: Once | INTRAMUSCULAR | Status: DC

## 2021-11-26 MED ORDER — ACETAMINOPHEN 10 MG/ML IV SOLN
1000.0000 mg | Freq: Once | INTRAVENOUS | Status: AC
  Administered 2021-11-26: 1000 mg via INTRAVENOUS

## 2021-11-26 MED ORDER — FENTANYL CITRATE (PF) 100 MCG/2ML IJ SOLN: 25.0000 ug | INTRAMUSCULAR | Status: DC | PRN

## 2021-11-26 MED ORDER — CHLORHEXIDINE GLUCONATE 4 % EX LIQD: 60.0000 mL | Freq: Once | CUTANEOUS | Status: DC

## 2021-11-26 MED ORDER — MIDAZOLAM HCL 2 MG/2ML IJ SOLN
INTRAMUSCULAR | Status: AC
  Filled 2021-11-26: qty 2

## 2021-11-26 MED ORDER — ORAL CARE MOUTH RINSE: 15.0000 mL | Freq: Once | OROMUCOSAL | Status: AC

## 2021-11-26 MED ORDER — DEXAMETHASONE SODIUM PHOSPHATE 10 MG/ML IJ SOLN
INTRAMUSCULAR | Status: AC
  Filled 2021-11-26: qty 1

## 2021-11-26 MED ORDER — POVIDONE-IODINE 10 % EX SWAB: 2.0000 "application " | Freq: Once | CUTANEOUS | Status: DC

## 2021-11-26 MED ORDER — ONDANSETRON HCL 4 MG/2ML IJ SOLN
INTRAMUSCULAR | Status: DC | PRN
Start: 2021-11-26 — End: 2021-11-26
  Administered 2021-11-26: 4 mg via INTRAVENOUS

## 2021-11-26 MED ORDER — BUPIVACAINE-EPINEPHRINE 0.5% -1:200000 IJ SOLN
INTRAMUSCULAR | Status: AC
  Filled 2021-11-26: qty 1

## 2021-11-26 MED ORDER — KETAMINE HCL 10 MG/ML IJ SOLN
INTRAMUSCULAR | Status: DC | PRN
  Administered 2021-11-26: 25 mg via INTRAVENOUS

## 2021-11-26 MED ORDER — MIDAZOLAM HCL 5 MG/5ML IJ SOLN
INTRAMUSCULAR | Status: DC | PRN
  Administered 2021-11-26: 2 mg via INTRAVENOUS

## 2021-11-26 MED ORDER — ASPIRIN EC 81 MG PO TBEC: 81.0000 mg | DELAYED_RELEASE_TABLET | Freq: Every day | ORAL | 0 refills | Status: AC

## 2021-11-26 MED ORDER — POTASSIUM CHLORIDE IN NACL 20-0.9 MEQ/L-% IV SOLN
INTRAVENOUS | Status: DC
  Filled 2021-11-26: qty 1000

## 2021-11-26 MED ORDER — CHLORHEXIDINE GLUCONATE 0.12 % MT SOLN
OROMUCOSAL | Status: AC
  Administered 2021-11-26: 15 mL via OROMUCOSAL
  Filled 2021-11-26: qty 15

## 2021-11-26 MED ORDER — HYDROMORPHONE HCL 1 MG/ML IJ SOLN
INTRAMUSCULAR | Status: AC
  Filled 2021-11-26: qty 1

## 2021-11-26 MED ORDER — CELECOXIB 200 MG PO CAPS
200.0000 mg | ORAL_CAPSULE | Freq: Once | ORAL | Status: AC
  Administered 2021-11-26: 200 mg via ORAL
  Filled 2021-11-26: qty 1

## 2021-11-26 MED ORDER — LACTATED RINGERS IV SOLN: INTRAVENOUS | Status: DC

## 2021-11-26 MED ORDER — TOBRAMYCIN SULFATE 1.2 G IJ SOLR
INTRAMUSCULAR | Status: AC
  Filled 2021-11-26: qty 1.2

## 2021-11-26 MED ORDER — ONDANSETRON HCL 4 MG/2ML IJ SOLN
INTRAMUSCULAR | Status: AC
  Filled 2021-11-26: qty 2

## 2021-11-26 MED ORDER — KETAMINE HCL 50 MG/5ML IJ SOSY
PREFILLED_SYRINGE | INTRAMUSCULAR | Status: AC
  Filled 2021-11-26: qty 5

## 2021-11-26 MED ORDER — LIDOCAINE 2% (20 MG/ML) 5 ML SYRINGE
INTRAMUSCULAR | Status: DC | PRN
  Administered 2021-11-26: 100 mg via INTRAVENOUS

## 2021-11-26 MED ORDER — POTASSIUM CHLORIDE IN NACL 20-0.9 MEQ/L-% IV SOLN
INTRAVENOUS | Status: DC
Start: 2021-11-26 — End: 2021-11-26
  Filled 2021-11-26: qty 1000

## 2021-11-26 MED ORDER — HYDROMORPHONE HCL 1 MG/ML IJ SOLN
0.2500 mg | INTRAMUSCULAR | Status: DC | PRN
  Administered 2021-11-26 (×4): 0.5 mg via INTRAVENOUS

## 2021-11-26 MED ORDER — AMISULPRIDE (ANTIEMETIC) 5 MG/2ML IV SOLN: 10.0000 mg | Freq: Once | INTRAVENOUS | Status: DC | PRN

## 2021-11-26 MED ORDER — OXYCODONE HCL 5 MG/5ML PO SOLN
ORAL | Status: AC
  Administered 2021-11-26: 5 mg
  Filled 2021-11-26: qty 5

## 2021-11-26 MED ORDER — BUPIVACAINE-EPINEPHRINE 0.5% -1:200000 IJ SOLN
INTRAMUSCULAR | Status: DC | PRN
  Administered 2021-11-26: 30 mL

## 2021-11-26 MED ORDER — CEFAZOLIN SODIUM-DEXTROSE 2-4 GM/100ML-% IV SOLN
2.0000 g | INTRAVENOUS | Status: AC
  Administered 2021-11-26: 2 g via INTRAVENOUS
  Filled 2021-11-26: qty 100

## 2021-11-26 MED ORDER — VANCOMYCIN HCL 1000 MG IV SOLR
INTRAVENOUS | Status: DC | PRN
  Administered 2021-11-26: 1000 mg

## 2021-11-26 MED ORDER — VANCOMYCIN HCL 1000 MG IV SOLR
INTRAVENOUS | Status: AC
  Filled 2021-11-26: qty 20

## 2021-11-26 MED ORDER — DEXAMETHASONE SODIUM PHOSPHATE 10 MG/ML IJ SOLN
INTRAMUSCULAR | Status: DC | PRN
Start: 2021-11-26 — End: 2021-11-26
  Administered 2021-11-26: 5 mg via INTRAVENOUS

## 2021-11-26 MED ORDER — OXYCODONE-ACETAMINOPHEN 5-325 MG PO TABS: 1.0000 | ORAL_TABLET | ORAL | 0 refills | Status: DC | PRN

## 2021-11-26 MED ORDER — PROPOFOL 10 MG/ML IV BOLUS
INTRAVENOUS | Status: DC | PRN
Start: 2021-11-26 — End: 2021-11-26
  Administered 2021-11-26: 200 mg via INTRAVENOUS

## 2021-11-26 MED ORDER — 0.9 % SODIUM CHLORIDE (POUR BTL) OPTIME
TOPICAL | Status: DC | PRN
  Administered 2021-11-26: 1000 mL

## 2021-11-26 MED ORDER — KETOROLAC TROMETHAMINE 30 MG/ML IJ SOLN
30.0000 mg | Freq: Once | INTRAMUSCULAR | Status: AC
  Administered 2021-11-26: 30 mg via INTRAVENOUS

## 2021-11-26 MED ORDER — KETOROLAC TROMETHAMINE 30 MG/ML IJ SOLN
INTRAMUSCULAR | Status: AC
  Filled 2021-11-26: qty 1

## 2021-11-26 MED ORDER — LIDOCAINE 2% (20 MG/ML) 5 ML SYRINGE
INTRAMUSCULAR | Status: AC
  Filled 2021-11-26: qty 5

## 2021-11-26 MED ORDER — DEXMEDETOMIDINE (PRECEDEX) IN NS 20 MCG/5ML (4 MCG/ML) IV SYRINGE
PREFILLED_SYRINGE | INTRAVENOUS | Status: DC | PRN
  Administered 2021-11-26 (×3): 4 ug via INTRAVENOUS
  Administered 2021-11-26: 8 ug via INTRAVENOUS

## 2021-11-26 MED ORDER — CHLORHEXIDINE GLUCONATE 0.12 % MT SOLN: 15.0000 mL | Freq: Once | OROMUCOSAL | Status: AC

## 2021-11-26 MED ORDER — HYDROMORPHONE HCL 1 MG/ML IJ SOLN
0.2500 mg | INTRAMUSCULAR | Status: DC | PRN
  Administered 2021-11-26 (×2): 0.5 mg via INTRAVENOUS

## 2021-11-26 MED ORDER — PROPOFOL 10 MG/ML IV BOLUS
INTRAVENOUS | Status: AC
  Filled 2021-11-26: qty 20

## 2021-11-26 MED ORDER — FENTANYL CITRATE (PF) 250 MCG/5ML IJ SOLN
INTRAMUSCULAR | Status: AC
  Filled 2021-11-26: qty 5

## 2021-11-26 MED ORDER — ACETAMINOPHEN 10 MG/ML IV SOLN
INTRAVENOUS | Status: AC
  Filled 2021-11-26: qty 100

## 2021-11-26 MED ORDER — FENTANYL CITRATE (PF) 250 MCG/5ML IJ SOLN
INTRAMUSCULAR | Status: DC | PRN
  Administered 2021-11-26: 50 ug via INTRAVENOUS
  Administered 2021-11-26: 100 ug via INTRAVENOUS
  Administered 2021-11-26 (×2): 50 ug via INTRAVENOUS

## 2021-11-26 SURGICAL SUPPLY — 74 items
BAG COUNTER SPONGE SURGICOUNT (BAG) ×2 IMPLANT
BANDAGE ESMARK 6X9 LF (GAUZE/BANDAGES/DRESSINGS) ×1 IMPLANT
BIT DRILL QC 2.5MM SHRT EVO SM (DRILL) IMPLANT
BLADE CLIPPER SURG (BLADE) IMPLANT
BLADE SURG 15 STRL LF DISP TIS (BLADE) ×1 IMPLANT
BLADE SURG 15 STRL SS (BLADE) ×1
BNDG ELASTIC 4X5.8 VLCR STR LF (GAUZE/BANDAGES/DRESSINGS) ×2 IMPLANT
BNDG ELASTIC 6X5.8 VLCR STR LF (GAUZE/BANDAGES/DRESSINGS) ×2 IMPLANT
BNDG ESMARK 6X9 LF (GAUZE/BANDAGES/DRESSINGS) ×2
BNDG GAUZE ELAST 4 BULKY (GAUZE/BANDAGES/DRESSINGS) ×2 IMPLANT
BRUSH SCRUB EZ PLAIN DRY (MISCELLANEOUS) ×4 IMPLANT
CANISTER SUCT 3000ML PPV (MISCELLANEOUS) ×2 IMPLANT
CHLORAPREP W/TINT 26 (MISCELLANEOUS) ×4 IMPLANT
COVER SURGICAL LIGHT HANDLE (MISCELLANEOUS) ×2 IMPLANT
CUFF TOURN SGL QUICK 34 (TOURNIQUET CUFF) ×1
CUFF TRNQT CYL 34X4.125X (TOURNIQUET CUFF) ×1 IMPLANT
DRAPE C-ARM 42X72 X-RAY (DRAPES) ×2 IMPLANT
DRAPE C-ARMOR (DRAPES) ×2 IMPLANT
DRAPE ORTHO SPLIT 77X108 STRL (DRAPES) ×2
DRAPE SURG ORHT 6 SPLT 77X108 (DRAPES) ×2 IMPLANT
DRAPE U-SHAPE 47X51 STRL (DRAPES) ×2 IMPLANT
DRILL QC 2.5MM SHORT EVOS SM (DRILL) ×2
DRSG MEPITEL 4X7.2 (GAUZE/BANDAGES/DRESSINGS) ×1 IMPLANT
DRSG PAD ABDOMINAL 8X10 ST (GAUZE/BANDAGES/DRESSINGS) ×4 IMPLANT
ELECT REM PT RETURN 9FT ADLT (ELECTROSURGICAL) ×2
ELECTRODE REM PT RTRN 9FT ADLT (ELECTROSURGICAL) ×1 IMPLANT
GAUZE SPONGE 4X4 12PLY STRL (GAUZE/BANDAGES/DRESSINGS) ×2 IMPLANT
GLOVE SURG ENC MOIS LTX SZ6.5 (GLOVE) ×6 IMPLANT
GLOVE SURG ENC MOIS LTX SZ7.5 (GLOVE) ×8 IMPLANT
GLOVE SURG UNDER POLY LF SZ6.5 (GLOVE) ×2 IMPLANT
GLOVE SURG UNDER POLY LF SZ7.5 (GLOVE) ×2 IMPLANT
GOWN STRL REUS W/ TWL LRG LVL3 (GOWN DISPOSABLE) ×2 IMPLANT
GOWN STRL REUS W/TWL LRG LVL3 (GOWN DISPOSABLE) ×2
IMMOBILIZER KNEE 22 UNIV (SOFTGOODS) ×2 IMPLANT
K-WIRE 1.6 (WIRE) ×1
K-WIRE FX150X1.6XTROC PNT (WIRE) ×1
KIT BASIN OR (CUSTOM PROCEDURE TRAY) ×2 IMPLANT
KIT TURNOVER KIT B (KITS) ×2 IMPLANT
KWIRE FX150X1.6XTROC PNT (WIRE) IMPLANT
NDL SUT 6 .5 CRC .975X.05 MAYO (NEEDLE) ×1 IMPLANT
NEEDLE MAYO TAPER (NEEDLE) ×1
NS IRRIG 1000ML POUR BTL (IV SOLUTION) ×2 IMPLANT
PACK TOTAL JOINT (CUSTOM PROCEDURE TRAY) ×2 IMPLANT
PAD ABD 8X10 STRL (GAUZE/BANDAGES/DRESSINGS) ×2 IMPLANT
PAD ARMBOARD 7.5X6 YLW CONV (MISCELLANEOUS) ×4 IMPLANT
PAD CAST 4YDX4 CTTN HI CHSV (CAST SUPPLIES) ×1 IMPLANT
PADDING CAST COTTON 4X4 STRL (CAST SUPPLIES) ×1
PADDING CAST COTTON 6X4 STRL (CAST SUPPLIES) ×2 IMPLANT
PLATE MED PRO EVOS 75 F/3.5 RT (Plate) ×1 IMPLANT
SCREW CORT ST EVOS 3.5X60 (Screw) ×1 IMPLANT
SCREW CTX 3.5X36MM EVOS (Screw) ×1 IMPLANT
SCREW CTX ST EVOS 3.5X40 (Screw) ×1 IMPLANT
SCREW LOCK 46X3.5XST STRL (Screw) IMPLANT
SCREW LOCK ST 3.5X46 (Screw) ×1 IMPLANT
SCREW LOCK ST EVOS 3.5X40 (Screw) ×2 IMPLANT
STAPLER VISISTAT 35W (STAPLE) ×2 IMPLANT
STRIP CLOSURE SKIN 1/2X4 (GAUZE/BANDAGES/DRESSINGS) ×1 IMPLANT
SUCTION FRAZIER HANDLE 10FR (MISCELLANEOUS) ×2
SUCTION TUBE FRAZIER 10FR DISP (MISCELLANEOUS) ×1 IMPLANT
SUT ETHILON 2 0 FS 18 (SUTURE) ×2 IMPLANT
SUT ETHILON 3 0 PS 1 (SUTURE) IMPLANT
SUT FIBERWIRE #2 38 T-5 BLUE (SUTURE)
SUT VIC AB 0 CT1 27 (SUTURE)
SUT VIC AB 0 CT1 27XBRD ANBCTR (SUTURE) IMPLANT
SUT VIC AB 1 CT1 18XCR BRD 8 (SUTURE) IMPLANT
SUT VIC AB 1 CT1 27 (SUTURE) ×1
SUT VIC AB 1 CT1 27XBRD ANBCTR (SUTURE) ×1 IMPLANT
SUT VIC AB 1 CT1 8-18 (SUTURE)
SUT VIC AB 2-0 CT1 27 (SUTURE) ×2
SUT VIC AB 2-0 CT1 TAPERPNT 27 (SUTURE) ×2 IMPLANT
SUTURE FIBERWR #2 38 T-5 BLUE (SUTURE) IMPLANT
TOWEL GREEN STERILE (TOWEL DISPOSABLE) ×4 IMPLANT
TRAY FOLEY MTR SLVR 16FR STAT (SET/KITS/TRAYS/PACK) IMPLANT
WATER STERILE IRR 1000ML POUR (IV SOLUTION) ×4 IMPLANT

## 2021-11-26 NOTE — Transfer of Care (Signed)
Immediate Anesthesia Transfer of Care Note  Patient: Terre Hanneman  Procedure(s) Performed: OPEN REDUCTION INTERNAL FIXATION (ORIF) TIBIAL PLATEAU (Right: Leg Upper)  Patient Location: PACU  Anesthesia Type:General  Level of Consciousness: awake, alert , oriented and patient cooperative  Airway & Oxygen Therapy: Patient Spontanous Breathing and Patient connected to nasal cannula oxygen  Post-op Assessment: Report given to RN and Post -op Vital signs reviewed and stable  Post vital signs: Reviewed and stable  Last Vitals:  Vitals Value Taken Time  BP 149/115 11/26/21 0855  Temp    Pulse 102 11/26/21 0858  Resp 22 11/26/21 0858  SpO2 99 % 11/26/21 0858  Vitals shown include unvalidated device data.  Last Pain:  Vitals:   11/26/21 0635  TempSrc:   PainSc: 8          Complications: No notable events documented.

## 2021-11-26 NOTE — Op Note (Signed)
Orthopaedic Surgery Operative Note (CSN: [id number] ) Date of Surgery: 11/26/2021  Admit Date: 11/26/2021   Diagnoses: Pre-Op Diagnoses: Right medial tibial plateau fracture  Post-Op Diagnosis: Same  Procedures: CPT [id number] reduction internal fixation of right tibial plateau fracture  Surgeons : Primary: [name] Needles, MD  Assistant: Patrecia Pace, PA-C  Location: OR 3   Anesthesia:General   Antibiotics: Ancef 2g preop with 1 gm vancomycin powder placed topically in incision   Tourniquet time: None used   Estimated Blood 123456 mL  Complications:None  Specimens:None  Implants: Implant Name Type Inv. Item Serial No. Manufacturer Lot No. LRB No. Used Action  SCREW CORT ST EVOS 3.5X60 - [id number] Screw SCREW CORT ST EVOS 3.5X60  [employer]  Right 1 Implanted  PIA-M-P-Tib 4 hole right plate      Right 1 Implanted  SCREW CTX ST EVOS 3.5X40 - [id number] Screw SCREW CTX ST EVOS 3.5X40  [employer]  Right 1 Implanted  SCREW CTX 3.5X36MM EVOS [id number] Screw SCREW CTX 3.5X36MM EVOS  [employer]  Right 1 Implanted  SCREW LOCK ST 3.5X46 [id number] Screw SCREW LOCK ST 3.5X46  [employer]  Right 1 Implanted  SCREW LOCK ST EVOS 3.5X40 [id number] Screw SCREW LOCK ST EVOS 3.5X40  [employer]  Right 2 Implanted     Indications for Surgery: 31 year old female who sustained a medial tibial plateau fracture.  Due to the varus nature of her fracture and potential for displacement of posttraumatic arthritis I recommend proceeding with open reduction internal fixation.  Risks and benefits were discussed with the patient.  Risks including but not limited to bleeding, infection, malunion, nonunion, hardware failure, heart rotation, nerve or blood vessel injury, posttraumatic arthritis, knee stiffness, DVT, even the possibility anesthetic complications.  She agreed to proceed with surgery and consent was  obtained.  Operative Findings: Open reduction internal fixation of medial tibial plateau fracture using [employer]hew partial articular medial tibial plateau locking plate  Procedure: The patient was identified in the preoperative holding area. Consent was confirmed with the patient and their family and all questions were answered. The operative extremity was marked after confirmation with the patient. she was then brought back to the operating room by our anesthesia colleagues.  She was carefully transferred over to radiolucent flat top table.  She was placed under general anesthetic.  A bump was placed under her operative hip.  The right lower extremity was then prepped and draped in usual sterile fashion.  Timeout was performed to verify the patient, the procedure, and the extremity.  Preoperative antibiotics were dosed.  Fluoroscopic imaging was obtained to show the unstable nature of her injury.  The joint line was marked and a medial direct approach was carried down through skin and subcutaneous tissue.  I carried it down through the subcutaneous fat to the overlying fascia.  I incised the fascia to expose the medial face and tibia.  I then hamstring tendons to visualize the deep MCL.  Valgus force was applied to the medial and I placed a partial articular [employer]hew EVOS medial tibial plateau plate and held provisionally with a K wire.  I then placed in a buttress fashion and a nonlocking screw just distal to the fracture to buttress the articular surface and keep it reduced.  2 more nonlocking screws were placed in the tibial shaft.  I then returned to the proximal segment and placed a total of  3 locking screws into the medial condyle.  Final fluoroscopic imaging was obtained.  The incision was copiously irrigated.  A gram of vancomycin powder was placed into the incision.  A layered closure of 0 Vicryl, 2-0 Vicryl and 3-0 Monocryl was used to close the skin.  Dermabond was used to seal the  skin.  Sterile dressing was applied.  The patient was then awoke from anesthesia and taken to the PACU in stable condition.  Post Op Plan/Instructions: The patient will be nonweightbearing to the right lower extremity.  She will receive aspirin for DVT prophylaxis.  She will have unrestricted range of motion of the knee.  She will follow-up in 2 weeks for x-rays and wound check.  I was present and performed the entire surgery.  Patrecia Pace, PA-C did assist me throughout the case. An assistant was necessary given the difficulty in approach, maintenance of reduction and ability to instrument the fracture.   Katha Hamming, MD [facility]

## 2021-11-26 NOTE — Interval H&P Note (Signed)
History and Physical Interval Note:  11/26/2021 7:22 AM  [name] [name]  has presented today for surgery, with the diagnosis of right medial tibial plateau fracture.  The various methods of treatment have been discussed with the patient and family. After consideration of risks, benefits and other options for treatment, the patient has consented to  Procedure(s): OPEN REDUCTION INTERNAL FIXATION (ORIF) TIBIAL PLATEAU (Right) as a surgical intervention.  The patient's history has been reviewed, patient examined, no change in status, stable for surgery.  I have reviewed the patient's chart and labs.  Questions were answered to the patient's satisfaction.     Lennette Bihari P Willia Lampert

## 2021-11-26 NOTE — Anesthesia Procedure Notes (Signed)
Procedure Name: LMA Insertion Date/Time: 11/26/2021 7:38 AM Performed by: Waynard Edwards, CRNA Pre-anesthesia Checklist: Patient identified, Emergency Drugs available, Suction available and Patient being monitored Patient Re-evaluated:Patient Re-evaluated prior to induction Oxygen Delivery Method: Circle System Utilized Preoxygenation: Pre-oxygenation with 100% oxygen Induction Type: IV induction Ventilation: Mask ventilation without difficulty LMA: LMA inserted LMA Size: 4.0 Number of attempts: 1 Placement Confirmation: positive ETCO2 Tube secured with: Tape Dental Injury: Teeth and Oropharynx as per pre-operative assessment

## 2021-11-26 NOTE — Anesthesia Postprocedure Evaluation (Signed)
Anesthesia Post Note  Patient: [name] [name]  Procedure(s) Performed: OPEN REDUCTION INTERNAL FIXATION (ORIF) TIBIAL PLATEAU (Right: Leg Upper)     Patient location during evaluation: PACU Anesthesia Type: General Level of consciousness: sedated Pain management: pain level controlled Vital Signs Assessment: post-procedure vital signs reviewed and stable Respiratory status: spontaneous breathing and respiratory function stable Cardiovascular status: stable Postop Assessment: no apparent nausea or vomiting Anesthetic complications: no   No notable events documented.  Last Vitals:  Vitals:   11/26/21 1010 11/26/21 1015  BP: 133/75 118/64  Pulse: 81 68  Resp: 18 13  Temp: 36.4 C   SpO2: 100% 100%    Last Pain:  Vitals:   11/26/21 0955  TempSrc:   PainSc: 5                  Antoine Fiallos DANIEL

## 2021-11-26 NOTE — Discharge Instructions (Addendum)
[facility] Discharge Instructions   General Discharge Instructions  WEIGHT BEARING STATUS:non-weightbearing right lower extremity  RANGE OF MOTION/ACTIVITY:ok for knee range of motion as tolerated  Wound Care:You may remove your surgical dressing on post-op day #3 (Monday, 11/29/21). Leave the white steri-strips in place. Incisions can be left open to air if there is no drainage. If incision continues to have drainage, follow wound care instructions below. Okay to shower if no drainage from incisions.  DVT/PE prophylaxis: Aspirin 81 mg daily x 30 days  Diet: as you were eating previously.  Can use over the counter stool softeners and bowel preparations, such as Miralax, to help with bowel movements.  Narcotics can be constipating.  Be sure to drink plenty of fluids  PAIN MEDICATION USE AND EXPECTATIONS  You have likely been given narcotic medications to help control your pain.  After a traumatic event that results in an fracture (broken bone) with or without surgery, it is ok to use narcotic pain medications to help control one's pain.  We understand that everyone responds to pain differently and each individual patient will be evaluated on a regular basis for the continued need for narcotic medications. Ideally, narcotic medication use should last no more than 6-8 weeks (coinciding with fracture healing).   As a patient it is your responsibility as well to monitor narcotic medication use and report the amount and frequency you use these medications when you come to your office visit.   We would also advise that if you are using narcotic medications, you should take a dose prior to therapy to maximize you participation.  IF YOU ARE ON NARCOTIC MEDICATIONS IT IS NOT PERMISSIBLE TO OPERATE A MOTOR VEHICLE (MOTORCYCLE/CAR/TRUCK/MOPED) OR HEAVY MACHINERY DO NOT MIX NARCOTICS WITH OTHER CNS (CENTRAL NERVOUS SYSTEM) DEPRESSANTS SUCH AS ALCOHOL   STOP SMOKING OR USING NICOTINE  PRODUCTS!!!!  As discussed nicotine severely impairs your body's ability to heal surgical and traumatic wounds but also impairs bone healing.  Wounds and bone heal by forming microscopic blood vessels (angiogenesis) and nicotine is a vasoconstrictor (essentially, shrinks blood vessels).  Therefore, if vasoconstriction occurs to these microscopic blood vessels they essentially disappear and are unable to deliver necessary nutrients to the healing tissue.  This is one modifiable factor that you can do to dramatically increase your chances of healing your injury.    (This means no smoking, no nicotine gum, patches, etc)  DO NOT USE NONSTEROIDAL ANTI-INFLAMMATORY DRUGS (NSAID'S)  Using products such as Advil (ibuprofen), Aleve (naproxen), Motrin (ibuprofen) for additional pain control during fracture healing can delay and/or prevent the healing response.  If you would like to take over the counter (OTC) medication, Tylenol (acetaminophen) is ok.  However, some narcotic medications that are given for pain control contain acetaminophen as well. Therefore, you should not exceed more than 4000 mg of tylenol in a day if you do not have liver disease.  Also note that there are may OTC medicines, such as cold medicines and allergy medicines that my contain tylenol as well.  If you have any questions about medications and/or interactions please ask your doctor/PA or your pharmacist.      ICE AND ELEVATE INJURED/OPERATIVE EXTREMITY  Using ice and elevating the injured extremity above your heart can help with swelling and pain control.  Icing in a pulsatile fashion, such as 20 minutes on and 20 minutes off, can be followed.    Do not place ice directly on skin. Make sure there is a  barrier between to skin and the ice pack.    Using frozen items such as frozen peas works well as the conform nicely to the are that needs to be iced.  USE AN ACE WRAP OR TED HOSE FOR SWELLING CONTROL  In addition to icing and elevation,  Ace wraps or TED hose are used to help limit and resolve swelling.  It is recommended to use Ace wraps or TED hose until you are informed to stop.    When using Ace Wraps start the wrapping distally (farthest away from the body) and wrap proximally (closer to the body)   Example: If you had surgery on your leg or thing and you do not have a splint on, start the ace wrap at the toes and work your way up to the thigh        If you had surgery on your upper extremity and do not have a splint on, start the ace wrap at your fingers and work your way up to the upper arm   CALL THE OFFICE FOR MEDICATION REFILLS OR WITH ANY QUESTIONS/CONCERNS: [phone]   VISIT OUR WEBSITE FOR ADDITIONAL INFORMATION: [email or web address]    Discharge Wound Care Instructions  Do NOT apply any ointments, solutions or lotions to pin sites or surgical wounds.  These prevent needed drainage and even though solutions like hydrogen peroxide kill bacteria, they also damage cells lining the pin sites that help fight infection.  Applying lotions or ointments can keep the wounds moist and can cause them to breakdown and open up as well. This can increase the risk for infection. When in doubt call the office.  If any drainage is noted, use one layer of adaptic or Mepitel, then gauze, Kerlix, and an ace wrap. - These dressing supplies should be available at local medical supply stores [facility], [facility], etc) as well as Insurance claims handler ([facility], [facility], [employer], etc)  Once the incision is completely dry and without drainage, it may be left open to air out.  Showering may begin 36-48 hours later.  Cleaning gently with soap and water.  Traumatic wounds should be dressed daily as well.    One layer of adaptic, gauze, Kerlix, then ace wrap.  The adaptic can be discontinued once the draining has ceased    If you have a wet to dry dressing: wet the gauze with saline the squeeze as much saline out so the gauze is moist (not  soaking wet), place moistened gauze over wound, then place a dry gauze over the moist one, followed by Kerlix wrap, then ace wrap.

## 2021-11-29 ENCOUNTER — Encounter (HOSPITAL_COMMUNITY): Payer: Self-pay | Admitting: Student

## 2021-12-07 DIAGNOSIS — S82141D Displaced bicondylar fracture of right tibia, subsequent encounter for closed fracture with routine healing: Secondary | ICD-10-CM | POA: Diagnosis not present

## 2022-01-04 DIAGNOSIS — S82131D Displaced fracture of medial condyle of right tibia, subsequent encounter for closed fracture with routine healing: Secondary | ICD-10-CM | POA: Diagnosis not present

## 2022-01-04 DIAGNOSIS — S82141D Displaced bicondylar fracture of right tibia, subsequent encounter for closed fracture with routine healing: Secondary | ICD-10-CM | POA: Diagnosis not present

## 2022-01-12 ENCOUNTER — Inpatient Hospital Stay (HOSPITAL_COMMUNITY): Payer: 59

## 2022-01-12 ENCOUNTER — Encounter (HOSPITAL_COMMUNITY): Payer: Self-pay | Admitting: *Deleted

## 2022-01-12 ENCOUNTER — Inpatient Hospital Stay (HOSPITAL_COMMUNITY)
Admission: AD | Admit: 2022-01-12 | Discharge: 2022-01-12 | Disposition: A | Discharge status: Home / Self Care | Payer: 59 | Attending: Obstetrics & Gynecology | Admitting: Obstetrics & Gynecology

## 2022-01-12 DIAGNOSIS — O99331 Smoking (tobacco) complicating pregnancy, first trimester: Secondary | ICD-10-CM | POA: Diagnosis not present

## 2022-01-12 DIAGNOSIS — O219 Vomiting of pregnancy, unspecified: Secondary | ICD-10-CM

## 2022-01-12 DIAGNOSIS — Z881 Allergy status to other antibiotic agents status: Secondary | ICD-10-CM | POA: Insufficient documentation

## 2022-01-12 DIAGNOSIS — F17210 Nicotine dependence, cigarettes, uncomplicated: Secondary | ICD-10-CM | POA: Insufficient documentation

## 2022-01-12 DIAGNOSIS — Z3A01 Less than 8 weeks gestation of pregnancy: Secondary | ICD-10-CM | POA: Diagnosis not present

## 2022-01-12 DIAGNOSIS — A599 Trichomoniasis, unspecified: Secondary | ICD-10-CM

## 2022-01-12 DIAGNOSIS — O210 Mild hyperemesis gravidarum: Secondary | ICD-10-CM | POA: Diagnosis present

## 2022-01-12 DIAGNOSIS — O98311 Other infections with a predominantly sexual mode of transmission complicating pregnancy, first trimester: Secondary | ICD-10-CM | POA: Insufficient documentation

## 2022-01-12 DIAGNOSIS — Z3491 Encounter for supervision of normal pregnancy, unspecified, first trimester: Secondary | ICD-10-CM

## 2022-01-12 DIAGNOSIS — A5901 Trichomonal vulvovaginitis: Secondary | ICD-10-CM | POA: Insufficient documentation

## 2022-01-12 LAB — CBC
HCT: 35.4 % — ABNORMAL LOW (ref 36.0–46.0)
Hemoglobin: 11.4 g/dL — ABNORMAL LOW (ref 12.0–15.0)
MCH: 22.8 pg — ABNORMAL LOW (ref 26.0–34.0)
MCHC: 32.2 g/dL (ref 30.0–36.0)
MCV: 70.9 fL — ABNORMAL LOW (ref 80.0–100.0)
Platelets: 434 10*3/uL — ABNORMAL HIGH (ref 150–400)
RBC: 4.99 MIL/uL (ref 3.87–5.11)
RDW: 16.2 % — ABNORMAL HIGH (ref 11.5–15.5)
WBC: 7.4 10*3/uL (ref 4.0–10.5)
nRBC: 0 % (ref 0.0–0.2)

## 2022-01-12 LAB — URINALYSIS, ROUTINE W REFLEX MICROSCOPIC
Bilirubin Urine: NEGATIVE
Glucose, UA: NEGATIVE mg/dL
Hgb urine dipstick: NEGATIVE
Ketones, ur: NEGATIVE mg/dL
Nitrite: NEGATIVE
Protein, ur: NEGATIVE mg/dL
Specific Gravity, Urine: 1.021 (ref 1.005–1.030)
pH: 6 (ref 5.0–8.0)

## 2022-01-12 LAB — ABO/RH: ABO/RH(D): O POS

## 2022-01-12 LAB — WET PREP, GENITAL
Clue Cells Wet Prep HPF POC: NONE SEEN
Sperm: NONE SEEN
WBC, Wet Prep HPF POC: >=10 — AB (ref <10)
Yeast Wet Prep HPF POC: NONE SEEN

## 2022-01-12 LAB — HCG, QUANTITATIVE, PREGNANCY: hCG, Beta Chain, Quant, S: 60820 m[IU]/mL — ABNORMAL HIGH (ref <5)

## 2022-01-12 LAB — POCT PREGNANCY, URINE: Preg Test, Ur: POSITIVE — AB

## 2022-01-12 MED ORDER — METOCLOPRAMIDE HCL 10 MG PO TABS
10.0000 mg | ORAL_TABLET | Freq: Once | ORAL | Status: AC
  Administered 2022-01-12: 10 mg via ORAL
  Filled 2022-01-12: qty 1

## 2022-01-12 MED ORDER — METOCLOPRAMIDE HCL 10 MG PO TABS: 10.0000 mg | ORAL_TABLET | Freq: Four times a day (QID) | ORAL | 0 refills | Status: DC

## 2022-01-12 MED ORDER — METRONIDAZOLE 500 MG PO TABS
2000.0000 mg | ORAL_TABLET | Freq: Once | ORAL | Status: AC
  Administered 2022-01-12: 2000 mg via ORAL
  Filled 2022-01-12: qty 4

## 2022-01-12 NOTE — MAU Provider Note (Signed)
?History  ?  ? ?CSN: [id number] ? ?Arrival date and time: 01/12/22 1223 ? ? Event Date/Time  ? First Provider Initiated Contact with Patient 01/12/22 1317   ?  ? ?Chief Complaint  ?Patient presents with  ? Nausea  ? ?HPI ?[name] [name] is a 31 y.o. [phone] at unknown gestation who presents to MAU for nausea. Patient reports positive upt at home last week. Has been feeling nauseous for several days. Had some dry heaving yesterday, but no vomiting. Had mild cramping several days ago, but none today. Denies vaginal bleeding or discharge. No urinary s/s or fever. She is unsure of LMP as she had knee surgery in February. She is unsure if last period was before or after her surgery. This is an unplanned pregnancy and she is unsure if she is planning on keeping this pregnancy. ? ?She does not have an OBGYN, last went to [facility] in 2021.  ? ? ?OB History   ? ? Gravida  ?5  ? Para  ?4  ? Term  ?3  ? Preterm  ?1  ? AB  ?0  ? Living  ?4  ?  ? ? SAB  ?0  ? IAB  ?0  ? Ectopic  ?0  ? Multiple  ?0  ? Live Births  ?4  ?   ?  ?  ? ? ?Past Medical History:  ?Diagnosis Date  ? Anemia   ? Anxiety   ? Chlamydia 10/2011  ? Cholecystitis   ? Closed head injury with concussion   ? Gestational hypertension   ? Headache(784.0)   ? Heart palpitations   ? Hypertension   ? ? ?Past Surgical History:  ?Procedure Laterality Date  ? ORIF ANKLE FRACTURE Left 02/12/2016  ? Procedure: OPEN REDUCTION INTERNAL FIXATION (ORIF) ANKLE FRACTURE;  Surgeon: Altamese , MD;  Location: [place];  Service: Orthopedics;  Laterality: Left;  ? ORIF TIBIA PLATEAU Right 11/26/2021  ? Procedure: OPEN REDUCTION INTERNAL FIXATION (ORIF) TIBIAL PLATEAU;  Surgeon: [name] Needles, MD;  Location: [place];  Service: Orthopedics;  Laterality: Right;  ? ? ?Family History  ?Problem Relation Age of Onset  ? Diabetes Maternal Grandmother   ? Hypertension Maternal Grandmother   ? Cancer Maternal Grandmother   ? Hypertension Mother   ? Anesthesia problems Neg Hx   ? Other Neg Hx    ? ? ?Social History  ? ?Tobacco Use  ? Smoking status: Every Day  ?  Packs/day: 0.50  ?  Years: 10.00  ?  Pack years: 5.00  ?  Types: Cigarettes  ? Smokeless tobacco: Never  ?Vaping Use  ? Vaping Use: Never used  ?Substance Use Topics  ? Alcohol use: No  ?  Comment: socially  ? Drug use: No  ? ? ?Allergies:  ?Allergies  ?Allergen Reactions  ? Metronidazole Nausea And Vomiting  ? ? ?Facility-Administered Medications Prior to Admission  ?Medication Dose Route Frequency Provider Last Rate Last Admin  ? medroxyPROGESTERone (DEPO-PROVERA) injection 150 mg  150 mg Intramuscular Q90 days Woodroe Mode, MD   150 mg at 01/28/20 1037  ? ?Medications Prior to Admission  ?Medication Sig Dispense Refill Last Dose  ? gabapentin (NEURONTIN) 250 MG/5ML solution Take 100 mg by mouth 3 (three) times daily.   Past Week  ? methocarbamol (ROBAXIN) 750 MG tablet Take 750 mg by mouth 4 (four) times daily.   Past Week  ? ondansetron (ZOFRAN) 4 MG tablet Take 4 mg by mouth every 8 (eight) hours as  needed for nausea or vomiting.     ? oxyCODONE-acetaminophen (PERCOCET) 5-325 MG tablet Take 1-2 tablets by mouth every 4 (four) hours as needed for severe pain. 60 tablet 0   ? ? ?Review of Systems  ?Constitutional: Negative.   ?Respiratory: Negative.    ?Cardiovascular: Negative.   ?Gastrointestinal:  Positive for nausea. Negative for abdominal pain and vomiting.  ?Genitourinary: Negative.   ?Neurological: Negative.   ?Physical Exam  ? ?Blood pressure 140/88, pulse 82, temperature 99 ?F (37.2 ?C), resp. rate 18, height 5\' 6"  (1.676 m), weight 107 kg, last menstrual period 11/30/2021. ? ?Physical Exam ?Vitals and nursing note reviewed.  ?Constitutional:   ?   General: She is not in acute distress. ?Eyes:  ?   Extraocular Movements: Extraocular movements intact.  ?   Pupils: Pupils are equal, round, and reactive to light.  ?Cardiovascular:  ?   Rate and Rhythm: Normal rate.  ?Pulmonary:  ?   Effort: Pulmonary effort is normal.  ?Abdominal:  ?    Palpations: Abdomen is soft.  ?   Tenderness: There is no abdominal tenderness.  ?Genitourinary: ?   Comments: Blind swabs collected ?Musculoskeletal:     ?   General: Normal range of motion.  ?Skin: ?   General: Skin is warm and dry.  ?Neurological:  ?   General: No focal deficit present.  ?   Mental Status: She is alert and oriented to person, place, and time.  ?Psychiatric:     ?   Mood and Affect: Mood normal.     ?   Behavior: Behavior normal.     ?   Thought Content: Thought content normal.     ?   Judgment: Judgment normal.  ? ? ?US OB LESS THAN 14 WEEKS WITH OB TRANSVAGINAL ? ?Result Date: 01/12/2022 ?CLINICAL DATA:  Pain, mild cramping. EXAM: OBSTETRIC <14 WK [place] AND TRANSVAGINAL OB US TECHNIQUE: Both transabdominal and transvaginal ultrasound examinations were performed for complete evaluation of the gestation as well as the maternal uterus, adnexal regions, and pelvic cul-de-sac. Transvaginal technique was performed to assess early pregnancy. COMPARISON:  None. FINDINGS: Intrauterine gestational sac: Single Yolk sac:  Visualized. Embryo:  Visualized. Cardiac Activity: Visualized. Heart Rate: 116 bpm CRL:  8.1 mm   6 w   5 d                  [place] EDC: 09/02/2022 Subchorionic hemorrhage:  None visualized. Maternal uterus/adnexae: Normal IMPRESSION: 1. Single live intrauterine gestation with approximate gestational age of [redacted] weeks and 5 days, EDC based on today's sonogram is 09/02/2022. 2. Uterus and bilateral adnexa are unremarkable. Electronically Signed   By: Keane Police D.O.   On: 01/12/2022 14:00   ? ?MAU Course  ?Procedures ? ?MDM ?UA, culture pending ?CBC, HCG, ABO/RH ?Wet prep, GC/CT ?[place] ? ?Live IUP. Patient is positive for trichomonas in both urine and wet prep. Patient has an intolerance to flagyl (nausea and vomiting), however has taken previously and tolerated well. She was given 2g flagyl PO today which she tolerated. Patient was instructed to avoid intercourse for at least 7 days after all partners  have been treated. At this time, patient is stable for discharge home. ? ?Assessment and Plan  ?IUP ?[redacted] weeks gestation of pregnancy ?Trichomonas infection ?Nausea ? ?- Discharge home in stable condition ?- Rx for Reglan sent to pharmacy ?- Return to MAU sooner for worsening symptoms ?- List of OBGYN's and safe meds provided in the even  that patient does intend to go through with pregnancy ? ? ? ?Renee Harder, CNM ?01/12/2022, 3:06 PM  ?

## 2022-01-12 NOTE — MAU Note (Signed)
.  [name] [name] is a 31 y.o. at Unknown here in MAU reporting: had positive HPT 3 days ago. Has been feeling nauseated. No vomiting today.mild cramping. Denies any vag bleeding or discharge ?LMP: 11/30/21(aprox) ?Onset of complaint: 3 days ?Pain score: 0 ?Vitals:  ? 01/12/22 1253  ?BP: 140/88  ?Pulse: 82  ?Resp: 18  ?Temp: 99 ?F (37.2 ?C)  ?   ?FHT:n/a ? ?Lab orders placed from triage: u/a upt  ? ?

## 2022-01-12 NOTE — Discharge Instructions (Signed)
[facility] ?Address: [address] [place]. [address], [place], [place] [address] ?Hours:  ?Monday 9AM-5PM ?Tuesday 10AM-6PM ?Wednesday 11AM-7PM ?Thursday 9AM-5PM ?Friday              8AM-2PM ?Phone: [phone] ? ?[facility] ?[facility] [place] ? ?                 Safe Medications in Pregnancy  ? ? ?Acne: ?Benzoyl Peroxide ?Salicylic Acid ? ?Backache/Headache: ?Tylenol: 2 regular strength every 4 hours OR ?             2 Extra strength every 6 hours ? ?Colds/Coughs/Allergies: ?Benadryl (alcohol free) 25 mg every 6 hours as needed ?Breath right strips ?Claritin ?Cepacol throat lozenges ?Chloraseptic throat spray ?Cold-Eeze- up to three times per day ?Cough drops, alcohol free ?Flonase (by prescription only) ?Guaifenesin ?Mucinex ?Robitussin DM (plain only, alcohol free) ?Saline nasal spray/drops ?Sudafed (pseudoephedrine) & Actifed ** use only after [redacted] weeks gestation and if you do not have high blood pressure ?Tylenol ?Vicks Vaporub ?Zinc lozenges ?Zyrtec  ? ?Constipation: ?Colace ?Ducolax suppositories ?Fleet enema ?Glycerin suppositories ?Metamucil ?Milk of magnesia ?Miralax ?Senokot ?Smooth move tea ? ?Diarrhea: ?Kaopectate ?Imodium A-D ? ?*NO pepto Bismol ? ?Hemorrhoids: ?Anusol ?Anusol HC ?Preparation H ?Tucks ? ?Indigestion: ?Tums ?Maalox ?Mylanta ?Zantac  ?Pepcid ? ?Insomnia: ?Benadryl (alcohol free) 25mg  every 6 hours as needed ?Tylenol PM ?Unisom, no Gelcaps ? ?Leg Cramps: ?Tums ?MagGel ? ?Nausea/Vomiting:  ?Bonine ?Dramamine ?Emetrol ?Ginger extract ?Sea bands ?Meclizine  ?Nausea medication to take during pregnancy:  ?Unisom (doxylamine succinate 25 mg tablets) Take one tablet daily at bedtime. If symptoms are not adequately controlled, the dose can be increased to a maximum recommended dose of two tablets daily (1/2 tablet in the morning, 1/2 tablet mid-afternoon and one at bedtime). ?Vitamin B6 100mg  tablets. Take one tablet twice a day (up to  200 mg per day). ? ?Skin Rashes: ?Aveeno products ?Benadryl cream or 25mg  every 6 hours as needed ?Calamine Lotion ?1% cortisone cream ? ?Yeast infection: ?Gyne-lotrimin 7 ?Monistat 7 ? ? ?**If taking multiple medications, please check labels to avoid duplicating the same active ingredients ?**take medication as directed on the label ?** Do not exceed 4000 mg of tylenol in 24 hours ?**Do not take medications that contain aspirin or ibuprofen ? ? ? Area Ob/Gyn Providers  ? ?[facility]             ?[address], [place], [employer] [address] ?[phone] ? ?[facility] [place] [facility]                                                             ?[address], [place], [facility], [address] ?[phone] ? ?[facility] [place] [facility] [place]                                    ?[address], [place], [place], [address] ?[phone] ? ?[facility] [place] [facility] ?[address], [address], [place], [facility], [address] ?[phone] ? ?[facility] [place] [facility]                                 ?  [address] [place], [place], [place], [address] ?[phone] ? ?[facility] at [facility]                                    ?[address] [place], [place], [address] ?[phone] ? ?[facility] [employer] at [facility] ?[address], [address], [place], [place], [address]                             ? ?[facility] ?[address], [place], [place], [address] ?[phone] ? ?[facility]         ?Phone: [phone] ? ?[facility]      ?Phone: [phone]  ? ?[facility]      ?Phone: [phone] ? ?[facility] Department-Family Planning         ?Phone: [phone]  ? ?[facility] Department-Maternity    ?Phone: [phone] ? ?[facility]      ?Phone: [phone] ? ?[facility]     ?Phone: [phone] ? ?[facility]        ?Phone: [phone] ? ?[facility]      ?Phone: [phone] ? ?

## 2022-01-13 LAB — CULTURE, OB URINE

## 2022-01-13 LAB — GC/CHLAMYDIA PROBE AMP (~~LOC~~) NOT AT ARMC
Chlamydia: NEGATIVE
Comment: NEGATIVE
Comment: NORMAL
Neisseria Gonorrhea: NEGATIVE

## 2022-01-14 ENCOUNTER — Ambulatory Visit: Payer: 59 | Attending: Student

## 2022-01-14 DIAGNOSIS — M6281 Muscle weakness (generalized): Secondary | ICD-10-CM

## 2022-01-14 DIAGNOSIS — R262 Difficulty in walking, not elsewhere classified: Secondary | ICD-10-CM | POA: Diagnosis present

## 2022-01-14 NOTE — Therapy (Signed)
?OUTPATIENT PHYSICAL THERAPY LOWER EXTREMITY EVALUATION ? ? ?[name] ?MRN: [id number] ?DOB:1991/01/21, 31 y.o., female ?Today's Date: 01/14/2022 ? ? PT End of Session - 01/14/22 1611   ? ? Visit Number 1   ? Number of Visits 17   ? Date for PT Re-Evaluation 03/18/22   ? Authorization Type [employer] OTHER;[place] MEDICAID [employer]   ? Progress Note Due on Visit 10   ? PT Start Time 1150   ? PT Stop Time 1235   ? PT Time Calculation (min) 45 min   ? Activity Tolerance Patient tolerated treatment well   ? [facility] tasks assessed/performed   ? ?  ?  ? ?  ? ? ?Past Medical History:  ?Diagnosis Date  ? Anemia   ? Anxiety   ? Chlamydia 10/2011  ? Cholecystitis   ? Closed head injury with concussion   ? Gestational hypertension   ? Headache(784.0)   ? Heart palpitations   ? Hypertension   ? ?Past Surgical History:  ?Procedure Laterality Date  ? ORIF ANKLE FRACTURE Left 02/12/2016  ? Procedure: OPEN REDUCTION INTERNAL FIXATION (ORIF) ANKLE FRACTURE;  Surgeon: Myrene GalasMichael Handy, MD;  [facility];  Service: Orthopedics;  Laterality: Left;  ? ORIF TIBIA PLATEAU Right 11/26/2021  ? Procedure: OPEN REDUCTION INTERNAL FIXATION (ORIF) TIBIAL PLATEAU;  Surgeon: Roby LoftsHaddix, Kevin P, MD;  [facility]: [facility] OR;  Service: Orthopedics;  Laterality: Right;  ? ?Patient Active Problem List  ? Diagnosis Date Noted  ? Right medial tibial plateau fracture 11/24/2021  ? Fetal pericardial effusion affecting management of mother 08/11/2017  ? Pre-eclampsia 07/31/2017  ? Group B streptococcus urinary tract infection affecting pregnancy 04/24/2017  ? Encounter for supervision of normal pregnancy, unspecified, unspecified trimester 04/10/2017  ? History of gestational hypertension 04/10/2017  ? Short interval between pregnancies affecting pregnancy, antepartum 04/10/2017  ? Murmur, cardiac 02/10/2016  ? ? ?PCP: Patient, No Pcp Per (Inactive) ? ?REFERRING PROVIDER: West BaliMcClung, Sarah A, PA-C ? ?REFERRING  DIAG: Right medial tibial plateau fracture ? ?THERAPY DIAG:  ?Muscle weakness (generalized) ? ?Difficulty in walking, not elsewhere classified ? ?ONSET DATE: 11/26/21 ? ?SUBJECTIVE:  ? ?SUBJECTIVE STATEMENT: ?Pt reports she injured her R leg when she stepped on a brick that had become dislodgeg from steps causing her to stumble and fall.. ? ?PERTINENT HISTORY: ? Obesity, L ankle fx 2017, pregnant 1st trimester ? ?PAIN:  ?Are you having pain? Yes: NPRS scale: 4/10 ?Pain location: Anterior knee pain ?Pain description: ache, throb ?Aggravating factors: Prolonged standing and walking 7/10 ?Relieving factors: Medications, moving it, rest ? ? ?PRECAUTIONS: None ? ?WEIGHT BEARING RESTRICTIONS  WBAT ? ?FALLS:  ?Has patient fallen in last 6 months? Yes. Number of falls 1 ? ?LIVING ENVIRONMENT: ?Lives with: lives with their family ?Lives in: Mobile home ?No issues with accessing home or mobility within home ? 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 ?PLOF: Independent ? ?PATIENT GOALS To be back to normal as much as posible, to walk more normally ? ? ?OBJECTIVE:  ? ?DIAGNOSTIC FINDINGS: not available ? ?PATIENT SURVEYS:  ?FOTO 36%  Functional status ? ?COGNITION: ? Overall cognitive status: Within functional limits for tasks assessed   ?  ?SENSATION: ?Light touch: NT pt wearing yoga pants ? ?Swelling: ? R knee appears swollen, difficult to assess with pt wearing yoga pants ? ?POSTURE:  ?WNL ? ?PALPATION: ?TTP of the anterior knee ? ?LE ROM: ? ?Active ROM Right ?01/14/2022 Left ?01/14/2022  ?Hip flexion    ?  Hip extension    ?Hip abduction    ?Hip adduction    ?Hip internal rotation    ?Hip external rotation    ?Knee flexion 120 130  ?Knee extension 0 0  ?Ankle dorsiflexion    ?Ankle plantarflexion    ?Ankle inversion    ?Ankle eversion    ? (Blank rows = not tested) ?Ext lag R -15d, L -5d ? ?LE MMT: ? ?MMT Right ?01/14/2022 Left ?01/14/2022  ?Hip flexion 4 4+  ?Hip extension 4 5  ?Hip abduction 4+ 5  ?Hip adduction 5 5  ?Hip internal  rotation 5 5  ?Hip external rotation 4 5  ?Knee flexion 4+ 5  ?Knee extension 4+ 5  ?Ankle dorsiflexion 5 5  ?Ankle plantarflexion 5 5  ?Ankle inversion    ?Ankle eversion    ? (Blank rows = not tested) ? ?FUNCTIONAL TESTS:  ?5 times sit to stand: 31.7 s use of hands, decreased wt bearing R LE ?2 minute walk test: 235 ft ?  ? ?GAIT: ?Distance walked: 235 ft within clinic ?Assistive device utilized: Crutches pt left crutches in car ?Level of assistance: Complete Independence ?Comments: Pt left crutches in car. Pt walked with an antlgic gait over the R LE. Pt kept R knee straight ? ? ? ?TODAY'S TREATMENT: ?Hip Flexion 10 reps - 10 hold ?Active Straight Leg Raise with Quad Set  10 reps - 3 hold ?Clamshell with Resistance  10 reps - 3 hold ?Sidelying Hip Abduction  10 reps - 3 hold ?Seated Long Arc Quad  10 reps - 3 hold ? ? ?PATIENT EDUCATION:  ?Education details: Eval findings, POC, HEP, symptom management ?Person educated: Patient ?Education method: Explanation, Demonstration, Tactile cues, Verbal cues, and Handouts ?Education comprehension: verbalized understanding, returned demonstration, verbal cues required, and tactile cues required ? ? ?HOME EXERCISE PROGRAM: ?Access Code: [email or web address] ?URL: [email or web address] ?Date: 01/14/2022 ?Prepared by: Joellyn Rued ? ?Exercises ?- Hip Flexion  - 1 x daily - 7 x weekly - 1 sets - 10 reps - 10 hold ?- Active Straight Leg Raise with Quad Set  - 1 x daily - 7 x weekly - 3 sets - 10 reps - 3 hold ?- Clamshell with Resistance  - 1 x daily - 7 x weekly - 3 sets - 10 reps - 3 hold ?- Sidelying Hip Abduction  - 1 x daily - 7 x weekly - 3 sets - 10 reps - 3 hold ?- Seated Long Arc Quad  - 1 x daily - 7 x weekly - 3 sets - 10 reps - 3 hold ? ?ASSESSMENT: ? ?CLINICAL IMPRESSION: ?Patient is a 31 y.o. F who was seen today for physical therapy evaluation and treatment for Right medial tibial plateau fracture.  ? ? ?OBJECTIVE IMPAIRMENTS Abnormal gait, decreased activity  tolerance, difficulty walking, decreased ROM, decreased strength, increased edema, obesity, and pain.  ? ?ACTIVITY LIMITATIONS cleaning, community activity, driving, meal prep, occupation, yard work, and shopping.  ? ?PERSONAL FACTORS 1 comorbidity: obesity  are also affecting patient's functional outcome.  ? ? ?REHAB POTENTIAL: Good ? ?CLINICAL DECISION MAKING: Stable/uncomplicated ? ?EVALUATION COMPLEXITY: Low ? ? ?GOALS: ? ?SHORT TERM GOALS: Target date: 02/04/2022 ? ?Pt will be Ind in an initial HEP ?Baseline: started on eval ?Goal status: INITIAL ? ?2.  Gait will progress to pt walking c 1 crutch or SPC the majority of the time ?Baseline: crutches ?Goal status: INITIAL ? ?LONG TERM GOALS: Target date: 03/18/22 ? ?Increase R hip strength to at least  4+/5 and R knee strength to 5/5 for improved function of the R LE ?Baseline: see flow sheet ?Goal status: INITIAL ? ?2.  Increase R knee flexion ROM to 125d for improved R knee function  ?Baseline: see flow sheet ?Goal status: INITIAL ? ?3.  R knee ext lag c SAQ will increase to -5 for improved R LE function ?Baseline: -15 ?Goal status: INITIAL ? ?4.  will increase to 450 ft as indication of improved function ?Baseline: 233 ft ?Goal status: INITIAL ? ?5. Decrease 5XSTS to 20 sec or less as indication of improved function ?Baseline: 31.7 sec ?Goal status: INITIAL ? ?6.  Pt will complete commmunity ambulation s an AD tolerating distances of 1082ft with a mild to no limp over the R LE. ?Baseline:  ?Goal status: INITIAL ? ? ?PLAN: ?PT FREQUENCY: 2x/week ? ?PT DURATION: 8 weeks ? ?PLANNED INTERVENTIONS: Therapeutic exercises, Therapeutic activity, Neuromuscular re-education, Balance training, Gait training, Patient/Family education, Joint mobilization, Stair training, Dry Needling, Electrical stimulation, Cryotherapy, Moist heat, scar mobilization, Taping, Vasopneumatic device, Ultrasound, Ionotophoresis 4mg /ml Dexamethasone, and Manual therapy ? ?PLAN FOR NEXT SESSION:  assess response to HEP, review FOTO, progress therex and complete gait training ? ? ? MS, PT ?01/14/22 4:50 PM ? ?

## 2022-01-18 NOTE — Therapy (Incomplete)
?OUTPATIENT PHYSICAL THERAPY TREATMENT NOTE ? ? ?[name] ?MRN: [id number] ?DOB:[birth year], 31 y.o., female ?Today's Date: 01/18/2022 ? ?PCP: Patient, No Pcp Per (Inactive) ?REFERRING PROVIDER: West Bali, PA-C ? ?END OF SESSION:  ? ? ?Past Medical History:  ?Diagnosis Date  ? Anemia   ? Anxiety   ? Chlamydia 10/2011  ? Cholecystitis   ? Closed head injury with concussion   ? Gestational hypertension   ? Headache(784.0)   ? Heart palpitations   ? Hypertension   ? ?Past Surgical History:  ?Procedure Laterality Date  ? ORIF ANKLE FRACTURE Left 02/12/2016  ? Procedure: OPEN REDUCTION INTERNAL FIXATION (ORIF) ANKLE FRACTURE;  Surgeon: Myrene Galas, MD;  [facility];  Service: Orthopedics;  Laterality: Left;  ? ORIF TIBIA PLATEAU Right 11/26/2021  ? Procedure: OPEN REDUCTION INTERNAL FIXATION (ORIF) TIBIAL PLATEAU;  Surgeon: Roby Lofts, MD;  [facility]: [facility] OR;  Service: Orthopedics;  Laterality: Right;  ? ?Patient Active Problem List  ? Diagnosis Date Noted  ? Right medial tibial plateau fracture 11/24/2021  ? Fetal pericardial effusion affecting management of mother 08/11/2017  ? Pre-eclampsia 07/31/2017  ? Group B streptococcus urinary tract infection affecting pregnancy 04/24/2017  ? Encounter for supervision of normal pregnancy, unspecified, unspecified trimester 04/10/2017  ? History of gestational hypertension 04/10/2017  ? Short interval between pregnancies affecting pregnancy, antepartum 04/10/2017  ? Murmur, cardiac 02/10/2016  ? ? ?REFERRING DIAG: *** ? ?THERAPY DIAG:  ?No diagnosis found. ? ? ?SUBJECTIVE:  ?  ?SUBJECTIVE STATEMENT: ?Pt reports she injured her R leg when she stepped on a brick that had become dislodgeg from steps causing her to stumble and fall.. ?  ?PAIN:  ?Are you having pain? Yes: NPRS scale: 4/10 ?Pain location: Anterior knee pain ?Pain description: ache, throb ?Aggravating factors: Prolonged standing and walking 7/10 ?Relieving factors: Medications, moving it,  rest ? ?PERTINENT HISTORY: ? Obesity, L ankle fx 2017, pregnant 1st trimester  ?  ?PRECAUTIONS: None ?  ?WEIGHT BEARING RESTRICTIONS  WBAT ?  ?FALLS:  ?Has patient fallen in last 6 months? Yes. Number of falls 1 ?  ?LIVING ENVIRONMENT: ?Lives with: lives with their family ?Lives in: Mobile home ?No issues with accessing home or mobility within home ?  ?OCCUPATION: [employer] examiner ?  ?PLOF: Independent ?  ?PATIENT GOALS To be back to normal as much as posible, to walk more normally ?  ?  ?OBJECTIVE:  ?  ?DIAGNOSTIC FINDINGS: not available ?  ?PATIENT SURVEYS:  ?FOTO 36%  Functional status ?  ?COGNITION: ?          Overall cognitive status: Within functional limits for tasks assessed               ?           ?SENSATION: ?Light touch: NT pt wearing yoga pants ?  ?Swelling: ?           R knee appears swollen, difficult to assess with pt wearing yoga pants ?  ?POSTURE:  ?WNL ?  ?PALPATION: ?TTP of the anterior knee ?  ?LE ROM: ?  ?Active ROM Right ?01/14/2022 Left ?01/14/2022  ?Hip flexion      ?Hip extension      ?Hip abduction      ?Hip adduction      ?Hip internal rotation      ?Hip external rotation      ?Knee flexion 120 130  ?Knee extension 0 0  ?Ankle dorsiflexion      ?  Ankle plantarflexion      ?Ankle inversion      ?Ankle eversion      ? (Blank rows = not tested) ?Ext lag R -15d, L -5d ?  ?LE MMT: ?  ?MMT Right ?01/14/2022 Left ?01/14/2022  ?Hip flexion 4 4+  ?Hip extension 4 5  ?Hip abduction 4+ 5  ?Hip adduction 5 5  ?Hip internal rotation 5 5  ?Hip external rotation 4 5  ?Knee flexion 4+ 5  ?Knee extension 4+ 5  ?Ankle dorsiflexion 5 5  ?Ankle plantarflexion 5 5  ?Ankle inversion      ?Ankle eversion      ? (Blank rows = not tested) ?  ?FUNCTIONAL TESTS:  ?5 times sit to stand: 31.7 s use of hands, decreased wt bearing R LE ?2 minute walk test: 235 ft ?  ?  ?GAIT: ?Distance walked: 235 ft within clinic ?Assistive device utilized: Crutches pt left crutches in car ?Level of assistance: Complete  Independence ?Comments: Pt left crutches in car. Pt walked with an antlgic gait over the R LE. Pt kept R knee straight ?  ?  ?  ?TODAY'S TREATMENT: ?[facility] Adult PT Treatment:                                                DATE: 01/19/22 ?Therapeutic Exercise: ?*** ?Manual Therapy: ?*** ?Neuromuscular re-ed: ?*** ?Therapeutic Activity: ?*** ?Modalities: ?*** ?Self Care: ?*** ? ?Eval Treatment: ?Hip Flexion 10 reps - 10 hold ?Active Straight Leg Raise with Quad Set  10 reps - 3 hold ?Clamshell with Resistance  10 reps - 3 hold ?Sidelying Hip Abduction  10 reps - 3 hold ?Seated Long Arc Quad  10 reps - 3 hold ?  ?  ?PATIENT EDUCATION:  ?Education details: Eval findings, POC, HEP, symptom management ?Person educated: Patient ?Education method: Explanation, Demonstration, Tactile cues, Verbal cues, and Handouts ?Education comprehension: verbalized understanding, returned demonstration, verbal cues required, and tactile cues required ?  ?  ?HOME EXERCISE PROGRAM: ?Access Code: [email or web address] ?URL: [email or web address] ?Date: 01/14/2022 ?Prepared by: Gar Ponto ?  ?Exercises ?- Hip Flexion  - 1 x daily - 7 x weekly - 1 sets - 10 reps - 10 hold ?- Active Straight Leg Raise with Quad Set  - 1 x daily - 7 x weekly - 3 sets - 10 reps - 3 hold ?- Clamshell with Resistance  - 1 x daily - 7 x weekly - 3 sets - 10 reps - 3 hold ?- Sidelying Hip Abduction  - 1 x daily - 7 x weekly - 3 sets - 10 reps - 3 hold ?- Seated Long Arc Quad  - 1 x daily - 7 x weekly - 3 sets - 10 reps - 3 hold ?  ?ASSESSMENT: ?  ?CLINICAL IMPRESSION: ?Patient is a 31 y.o. F who was seen today for physical therapy evaluation and treatment for Right medial tibial plateau fracture.  ?  ?  ?OBJECTIVE IMPAIRMENTS Abnormal gait, decreased activity tolerance, difficulty walking, decreased ROM, decreased strength, increased edema, obesity, and pain.  ?  ?ACTIVITY LIMITATIONS cleaning, community activity, driving, meal prep, occupation, yard work, and  shopping.  ?  ?PERSONAL FACTORS 1 comorbidity: obesity  are also affecting patient's functional outcome.  ?  ?  ?REHAB POTENTIAL: Good ?  ?CLINICAL DECISION MAKING: Stable/uncomplicated ?  ?EVALUATION COMPLEXITY: Low ?  ?  ?  GOALS: ?  ?SHORT TERM GOALS: Target date: 02/04/2022 ?  ?Pt will be Ind in an initial HEP ?Baseline: started on eval ?Goal status: INITIAL ?  ?2.  Gait will progress to pt walking c 1 crutch or SPC the majority of the time ?Baseline: crutches ?Goal status: INITIAL ?  ?LONG TERM GOALS: Target date: 03/18/22 ?  ?Increase R hip strength to at least 4+/5 and R knee strength to 5/5 for improved function of the R LE ?Baseline: see flow sheet ?Goal status: INITIAL ?  ?2.  Increase R knee flexion ROM to 125d for improved R knee function  ?Baseline: see flow sheet ?Goal status: INITIAL ?  ?3.  R knee ext lag c SAQ will increase to -5 for improved R LE function ?Baseline: -15 ?Goal status: INITIAL ?  ?4.  will increase to 450 ft as indication of improved function ?Baseline: 233 ft ?Goal status: INITIAL ?  ?5. Decrease 5XSTS to 20 sec or less as indication of improved function ?Baseline: 31.7 sec ?Goal status: INITIAL ?  ?6.  Pt will complete commmunity ambulation s an AD tolerating distances of 1036ft with a mild to no limp over the R LE. ?Baseline:  ?Goal status: INITIAL ?  ?  ?PLAN: ?PT FREQUENCY: 2x/week ?  ?PT DURATION: 8 weeks ?  ?PLANNED INTERVENTIONS: Therapeutic exercises, Therapeutic activity, Neuromuscular re-education, Balance training, Gait training, Patient/Family education, Joint mobilization, Stair training, Dry Needling, Electrical stimulation, Cryotherapy, Moist heat, scar mobilization, Taping, Vasopneumatic device, Ultrasound, Ionotophoresis 4mg /ml Dexamethasone, and Manual therapy ?  ?PLAN FOR NEXT SESSION: assess response to HEP, review FOTO, progress therex and complete gait training ?  ? ? ? , PT ?01/18/2022, 10:01 PM ? ?   ?

## 2022-01-19 ENCOUNTER — Ambulatory Visit: Payer: 59

## 2022-01-20 NOTE — Therapy (Incomplete)
?OUTPATIENT PHYSICAL THERAPY TREATMENT NOTE ? ? ?[name] ?MRN: [id number] ?DOB:[birth year], 31 y.o., female ?Today's Date: 01/20/2022 ? ?PCP: Patient, No Pcp Per (Inactive) ?REFERRING PROVIDER: Corinne Ports, PA-C ? ?END OF SESSION:  ? ? ?Past Medical History:  ?Diagnosis Date  ? Anemia   ? Anxiety   ? Chlamydia 10/2011  ? Cholecystitis   ? Closed head injury with concussion   ? Gestational hypertension   ? Headache(784.0)   ? Heart palpitations   ? Hypertension   ? ?Past Surgical History:  ?Procedure Laterality Date  ? ORIF ANKLE FRACTURE Left 02/12/2016  ? Procedure: OPEN REDUCTION INTERNAL FIXATION (ORIF) ANKLE FRACTURE;  Surgeon: Altamese [place], MD;  Location: [place];  Service: Orthopedics;  Laterality: Left;  ? ORIF TIBIA PLATEAU Right 11/26/2021  ? Procedure: OPEN REDUCTION INTERNAL FIXATION (ORIF) TIBIAL PLATEAU;  Surgeon: [name] Needles, MD;  Location: [place];  Service: Orthopedics;  Laterality: Right;  ? ?Patient Active Problem List  ? Diagnosis Date Noted  ? Right medial tibial plateau fracture 11/24/2021  ? Fetal pericardial effusion affecting management of mother 08/11/2017  ? Pre-eclampsia 07/31/2017  ? Group B streptococcus urinary tract infection affecting pregnancy 04/24/2017  ? Encounter for supervision of normal pregnancy, unspecified, unspecified trimester 04/10/2017  ? History of gestational hypertension 04/10/2017  ? Short interval between pregnancies affecting pregnancy, antepartum 04/10/2017  ? Murmur, cardiac 02/10/2016  ? ? ?REFERRING DIAG: *** ? ?THERAPY DIAG:  ?No diagnosis found. ? ? ?SUBJECTIVE:  ?  ?SUBJECTIVE STATEMENT: ?Pt reports she injured her R leg when she stepped on a brick that had become dislodgeg from steps causing her to stumble and fall.. ?  ?PAIN:  ?Are you having pain? Yes: NPRS scale: 4/10 ?Pain location: Anterior knee pain ?Pain description: ache, throb ?Aggravating factors: Prolonged standing and walking 7/10 ?Relieving factors: Medications, moving it,  rest ? ?PERTINENT HISTORY: ? Obesity, L ankle fx 2017, pregnant 1st trimester  ?  ?PRECAUTIONS: None ?  ?WEIGHT BEARING RESTRICTIONS  WBAT ?  ?FALLS:  ?Has patient fallen in last 6 months? Yes. Number of falls 1 ?  ?LIVING ENVIRONMENT: ?Lives with: lives with their family ?Lives in: Mobile home ?No issues with accessing home or mobility within home ?  ?OCCUPATION: [employer] ?  ?PLOF: Independent ?  ?PATIENT GOALS To be back to normal as much as posible, to walk more normally ?  ?  ?OBJECTIVE:  ?  ?DIAGNOSTIC FINDINGS: not available ?  ?PATIENT SURVEYS:  ?FOTO 36%  Functional status ?  ?COGNITION: ?          Overall cognitive status: Within functional limits for tasks assessed               ?           ?SENSATION: ?Light touch: NT pt wearing yoga pants ?  ?Swelling: ?           R knee appears swollen, difficult to assess with pt wearing yoga pants ?  ?POSTURE:  ?WNL ?  ?PALPATION: ?TTP of the anterior knee ?  ?LE ROM: ?  ?Active ROM Right ?01/14/2022 Left ?01/14/2022  ?Hip flexion      ?Hip extension      ?Hip abduction      ?Hip adduction      ?Hip internal rotation      ?Hip external rotation      ?Knee flexion 120 130  ?Knee extension 0 0  ?Ankle dorsiflexion      ?  Ankle plantarflexion      ?Ankle inversion      ?Ankle eversion      ? (Blank rows = not tested) ?Ext lag R -15d, L -5d ?  ?LE MMT: ?  ?MMT Right ?01/14/2022 Left ?01/14/2022  ?Hip flexion 4 4+  ?Hip extension 4 5  ?Hip abduction 4+ 5  ?Hip adduction 5 5  ?Hip internal rotation 5 5  ?Hip external rotation 4 5  ?Knee flexion 4+ 5  ?Knee extension 4+ 5  ?Ankle dorsiflexion 5 5  ?Ankle plantarflexion 5 5  ?Ankle inversion      ?Ankle eversion      ? (Blank rows = not tested) ?  ?FUNCTIONAL TESTS:  ?5 times sit to stand: 31.7 s use of hands, decreased wt bearing R LE ?2 minute walk test: 235 ft ?  ?  ?GAIT: ?Distance walked: 235 ft within clinic ?Assistive device utilized: Crutches pt left crutches in car ?Level of assistance: Complete  Independence ?Comments: Pt left crutches in car. Pt walked with an antlgic gait over the R LE. Pt kept R knee straight ?  ?  ?  ?TODAY'S TREATMENT: ?[facility] Adult PT Treatment:                                                DATE: 01/19/22 ?Therapeutic Exercise: ?*** ?Manual Therapy: ?*** ?Neuromuscular re-ed: ?*** ?Therapeutic Activity: ?*** ?Modalities: ?*** ?Self Care: ?*** ? ?Eval Treatment: ?Hip Flexion 10 reps - 10 hold ?Active Straight Leg Raise with Quad Set  10 reps - 3 hold ?Clamshell with Resistance  10 reps - 3 hold ?Sidelying Hip Abduction  10 reps - 3 hold ?Seated Long Arc Quad  10 reps - 3 hold ?  ?  ?PATIENT EDUCATION:  ?Education details: Eval findings, POC, HEP, symptom management ?Person educated: Patient ?Education method: Explanation, Demonstration, Tactile cues, Verbal cues, and Handouts ?Education comprehension: verbalized understanding, returned demonstration, verbal cues required, and tactile cues required ?  ?  ?HOME EXERCISE PROGRAM: ?Access Code: [email or web address] ?URL: [email or web address] ?Date: 01/14/2022 ?Prepared by: Gar Ponto ?  ?Exercises ?- Hip Flexion  - 1 x daily - 7 x weekly - 1 sets - 10 reps - 10 hold ?- Active Straight Leg Raise with Quad Set  - 1 x daily - 7 x weekly - 3 sets - 10 reps - 3 hold ?- Clamshell with Resistance  - 1 x daily - 7 x weekly - 3 sets - 10 reps - 3 hold ?- Sidelying Hip Abduction  - 1 x daily - 7 x weekly - 3 sets - 10 reps - 3 hold ?- Seated Long Arc Quad  - 1 x daily - 7 x weekly - 3 sets - 10 reps - 3 hold ?  ?ASSESSMENT: ?  ?CLINICAL IMPRESSION: ?Patient is a 31 y.o. F who was seen today for physical therapy evaluation and treatment for Right medial tibial plateau fracture.  ?  ?  ?OBJECTIVE IMPAIRMENTS Abnormal gait, decreased activity tolerance, difficulty walking, decreased ROM, decreased strength, increased edema, obesity, and pain.  ?  ?ACTIVITY LIMITATIONS cleaning, community activity, driving, meal prep, occupation, yard work, and  shopping.  ?  ?PERSONAL FACTORS 1 comorbidity: obesity  are also affecting patient's functional outcome.  ?  ?  ?REHAB POTENTIAL: Good ?  ?CLINICAL DECISION MAKING: Stable/uncomplicated ?  ?EVALUATION COMPLEXITY: Low ?  ?  ?  GOALS: ?  ?SHORT TERM GOALS: Target date: 02/04/2022 ?  ?Pt will be Ind in an initial HEP ?Baseline: started on eval ?Goal status: INITIAL ?  ?2.  Gait will progress to pt walking c 1 crutch or SPC the majority of the time ?Baseline: crutches ?Goal status: INITIAL ?  ?LONG TERM GOALS: Target date: 03/18/22 ?  ?Increase R hip strength to at least 4+/5 and R knee strength to 5/5 for improved function of the R LE ?Baseline: see flow sheet ?Goal status: INITIAL ?  ?2.  Increase R knee flexion ROM to 125d for improved R knee function  ?Baseline: see flow sheet ?Goal status: INITIAL ?  ?3.  R knee ext lag c SAQ will increase to -5 for improved R LE function ?Baseline: -15 ?Goal status: INITIAL ?  ?4.  2MWT will increase to 450 ft as indication of improved function ?Baseline: 233 ft ?Goal status: INITIAL ?  ?5. Decrease 5XSTS to 20 sec or less as indication of improved function ?Baseline: 31.7 sec ?Goal status: INITIAL ?  ?6.  Pt will complete commmunity ambulation s an AD tolerating distances of 1073ft with a mild to no limp over the R LE. ?Baseline:  ?Goal status: INITIAL ?  ?  ?PLAN: ?PT FREQUENCY: 2x/week ?  ?PT DURATION: 8 weeks ?  ?PLANNED INTERVENTIONS: Therapeutic exercises, Therapeutic activity, Neuromuscular re-education, Balance training, Gait training, Patient/Family education, Joint mobilization, Stair training, Dry Needling, Electrical stimulation, Cryotherapy, Moist heat, scar mobilization, Taping, Vasopneumatic device, Ultrasound, Ionotophoresis 4mg /ml Dexamethasone, and Manual therapy ?  ?PLAN FOR NEXT SESSION: assess response to HEP, review FOTO, progress therex and complete gait training ?  ? ? ?Gar Ponto, PT ?01/20/2022, 9:36 PM ? ?   ?

## 2022-01-21 ENCOUNTER — Ambulatory Visit: Payer: 59

## 2022-01-25 ENCOUNTER — Ambulatory Visit: Payer: 59

## 2022-01-25 DIAGNOSIS — R262 Difficulty in walking, not elsewhere classified: Secondary | ICD-10-CM

## 2022-01-25 DIAGNOSIS — M6281 Muscle weakness (generalized): Secondary | ICD-10-CM

## 2022-01-25 NOTE — Therapy (Signed)
?OUTPATIENT PHYSICAL THERAPY TREATMENT NOTE ? ? ?[name] ?MRN: [id number] ?DOB:1991-03-16, 31 y.o., female ?Today's Date: 01/25/2022 ? ?PCP: Patient, No Pcp Per (Inactive) ?REFERRING PROVIDER: [place], PA-C ? ?END OF SESSION:  ? PT End of Session - 01/25/22 1743   ? ? Visit Number 2   ? Number of Visits 17   ? Date for PT Re-Evaluation 03/18/22   ? Authorization Type [employer] OTHER;[place] MEDICAID [employer]   ? Progress Note Due on Visit 10   ? PT Start Time 1421   ? PT Stop Time 1500   ? PT Time Calculation (min) 39 min   ? Activity Tolerance Patient tolerated treatment well   ? Behavior During Therapy [facility] for tasks assessed/performed   ? ?  ?  ? ?  ? ? ?Past Medical History:  ?Diagnosis Date  ? Anemia   ? Anxiety   ? Chlamydia 10/2011  ? Cholecystitis   ? Closed head injury with concussion   ? Gestational hypertension   ? Headache(784.0)   ? Heart palpitations   ? Hypertension   ? ?Past Surgical History:  ?Procedure Laterality Date  ? ORIF ANKLE FRACTURE Left 02/12/2016  ? Procedure: OPEN REDUCTION INTERNAL FIXATION (ORIF) ANKLE FRACTURE;  Surgeon: Myrene Galas, MD;  Location: [facility];  Service: Orthopedics;  Laterality: Left;  ? ORIF TIBIA PLATEAU Right 11/26/2021  ? Procedure: OPEN REDUCTION INTERNAL FIXATION (ORIF) TIBIAL PLATEAU;  Surgeon: Roby Lofts, MD;  Location: [facility];  Service: Orthopedics;  Laterality: Right;  ? ?Patient Active Problem List  ? Diagnosis Date Noted  ? Right medial tibial plateau fracture 11/24/2021  ? Fetal pericardial effusion affecting management of mother 08/11/2017  ? Pre-eclampsia 07/31/2017  ? Group B streptococcus urinary tract infection affecting pregnancy 04/24/2017  ? Encounter for supervision of normal pregnancy, unspecified, unspecified trimester 04/10/2017  ? History of gestational hypertension 04/10/2017  ? Short interval between pregnancies affecting pregnancy, antepartum 04/10/2017  ? Murmur, cardiac 02/10/2016   ? ? ?REFERRING DIAG: Right medial tibial plateau fracture ? ?THERAPY DIAG:  ?Muscle weakness (generalized) ? ?Difficulty in walking, not elsewhere classified ? ? ?SUBJECTIVE:  ?  ?SUBJECTIVE STATEMENT: ?Pt reports she missed PT last week related to medical reasons. Pt reports she is feeling better this week ?  ?PAIN:  ?Are you having pain? Yes: NPRS scale: 0/10 ?Pain location: Anterior knee pain ?Pain description: ache, throb ?Aggravating factors: Prolonged standing and walking 7/10 ?Relieving factors: Medications, moving it, rest ? ?PERTINENT HISTORY: ? Obesity, L ankle fx  ?  ?PRECAUTIONS: None ?  ?WEIGHT BEARING RESTRICTIONS  WBAT ?  ?FALLS:  ?Has patient fallen in last 6 months? Yes. Number of falls 1 ?  ?LIVING ENVIRONMENT: ?Lives with: lives with their family ?Lives in: Mobile home ?No issues with accessing home or mobility within home ?  ?OCCUPATION: [employer] ?  ?PLOF: Independent ?  ?PATIENT GOALS To be back to normal as much as posible, to walk more normally ?  ?  ?OBJECTIVE:  ?  ?DIAGNOSTIC FINDINGS: not available ?  ?PATIENT SURVEYS:  ?FOTO 36%  Functional status ?  ?COGNITION: ?          Overall cognitive status: Within functional limits for tasks assessed               ?           ?SENSATION: ?Light touch: NT pt wearing yoga pants ?  ?Swelling: ?  R knee appears swollen, difficult to assess with pt wearing yoga pants ?  ?POSTURE:  ?WNL ?  ?PALPATION: ?TTP of the anterior knee ?  ?LE ROM: ?  ?Active ROM Right ?01/14/2022 Left ?01/14/2022  ?Hip flexion      ?Hip extension      ?Hip abduction      ?Hip adduction      ?Hip internal rotation      ?Hip external rotation      ?Knee flexion 120 130  ?Knee extension 0 0  ?Ankle dorsiflexion      ?Ankle plantarflexion      ?Ankle inversion      ?Ankle eversion      ? (Blank rows = not tested) ?Ext lag R -15d, L -5d ?  ?LE MMT: ?  ?MMT Right ?01/14/2022 Left ?01/14/2022  ?Hip flexion 4 4+  ?Hip extension 4 5  ?Hip abduction 4+ 5  ?Hip adduction 5 5   ?Hip internal rotation 5 5  ?Hip external rotation 4 5  ?Knee flexion 4+ 5  ?Knee extension 4+ 5  ?Ankle dorsiflexion 5 5  ?Ankle plantarflexion 5 5  ?Ankle inversion      ?Ankle eversion      ? (Blank rows = not tested) ?  ?FUNCTIONAL TESTS:  ?5 times sit to stand: 31.7 s use of hands, decreased wt bearing R LE ?2 minute walk test: 235 ft ?  ?  ?GAIT: ?Distance walked: 235 ft within clinic ?Assistive device utilized: Crutches pt left crutches in car ?Level of assistance: Complete Independence ?Comments: Pt left crutches in car. Pt walked with an antlgic gait over the R LE. Pt kept R knee straight ?  ?  ?  ?TODAY'S TREATMENT: ?[facility]                                                DATE: 01/25/22 ?Therapeutic Exercise: ?Nu step L5  5 mins ?Heel slide10 reps - 10 hold ?Active Straight Leg Raise with Quad Set  x15 reps - 3 hold ?Bridging x15 reps - 3 hold ?Clamshell with Resistance  15 reps - 3 hold ?Sidelying Hip Abduction  15 reps - 3 hold ?Seated Long Arc Quad  15 reps - 3 hold 5# ?STS x 15 rep ?SLS R c hand A as needed x5 30 sec ? ?Therapeutic Activity: ?Gait training heel toe gait pattern. Pt weight shifts over the R LE appearing due to hip abd weakness ? ?Eval Treatment: ?Hip Flexion 10 reps - 10 hold ?Active Straight Leg Raise with Quad Set  10 reps - 3 hold ?Clamshell with Resistance  10 reps - 3 hold ?Sidelying Hip Abduction  10 reps - 3 hold ?Seated Long Arc Quad  10 reps - 3 hold ?  ?  ?PATIENT EDUCATION:  ?Education details: Eval findings, POC, HEP, symptom management ?Person educated: Patient ?Education method: Explanation, Demonstration, Tactile cues, Verbal cues, and Handouts ?Education comprehension: verbalized understanding, returned demonstration, verbal cues required, and tactile cues required ?  ?  ?HOME EXERCISE PROGRAM: ?Access Code: [email or web address] ?URL: [email or web address] ?Date: 01/25/2022 ?Prepared by: Joellyn RuedAllen Sai Zinn ? ?Exercises ?- Hip Flexion  - 1 x daily - 7 x weekly  - 1 sets - 10 reps - 10 hold ?- Active Straight Leg Raise with Quad Set  - 1 x daily - 7 x weekly -  2 sets - 10 reps - 3 hold ?- Clamshell with Resistance  - 1 x daily - 7 x weekly - 2 sets - 10 reps - 3 hold ?- Sidelying Hip Abduction  - 1 x daily - 7 x weekly - 2 sets - 10 reps - 3 hold ?- Seated Long Arc Quad  - 1 x daily - 7 x weekly - 2 sets - 10 reps - 3 hold ?- Supine Bridge  - 1 x daily - 7 x weekly - 2 sets - 10 reps - 3 hold ?- Sit to Stand Without Arm Support  - 1 x daily - 7 x weekly - 2 sets - 10 reps - 3 hold ? ?ASSESSMENT: ?  ?CLINICAL IMPRESSION: ?Pt returns to PT for her 1st visit since the initial eval. Due to medical issues, pt reports she has not been completing her HEP. PT was completed today for R LE strengthening and review of HEP. Due to hip abd weakness, the pt is wt shifting to the R over the R LE. Pt is walking more consistently c a heel to toe pattern. Pt was encouraged to complete her HEP daily. Pt tolerated the session without adverse effects.  ?  ?OBJECTIVE IMPAIRMENTS Abnormal gait, decreased activity tolerance, difficulty walking, decreased ROM, decreased strength, increased edema, obesity, and pain.   ?  ?REHAB POTENTIAL: Good ? ?  ?GOALS: ?  ?SHORT TERM GOALS: Target date: 02/04/2022 ?  ?Pt will be Ind in an initial HEP ?Baseline: started on eval ?Goal status: INITIAL ?  ?2.  Gait will progress to pt walking c 1 crutch or SPC the majority of the time ?Baseline: crutches ?Goal status: INITIAL ?  ?LONG TERM GOALS: Target date: 03/18/22 ?  ?Increase R hip strength to at least 4+/5 and R knee strength to 5/5 for improved function of the R LE ?Baseline: see flow sheet ?Goal status: INITIAL ?  ?2.  Increase R knee flexion ROM to 125d for improved R knee function  ?Baseline: see flow sheet ?Goal status: INITIAL ?  ?3.  R knee ext lag c SAQ will increase to -5 for improved R LE function ?Baseline: -15 ?Goal status: INITIAL ?  ?4.  will increase to 450 ft as indication of improved  function ?Baseline: 233 ft ?Goal status: INITIAL ?  ?5. Decrease 5XSTS to 20 sec or less as indication of improved function ?Baseline: 31.7 sec ?Goal status: INITIAL ?  ?6.  Pt will complete commmunity ambulation s an AD

## 2022-01-26 NOTE — Therapy (Signed)
?OUTPATIENT PHYSICAL THERAPY TREATMENT NOTE ? ? ?[name] ?MRN: [id number] ?DOB:March 04, 1991, 31 y.o., female ?Today's Date: 01/27/2022 ? ?PCP: Patient, No Pcp Per (Inactive) ?REFERRING PROVIDER: Corinne Ports, PA-C ? ?END OF SESSION:  ? PT End of Session - 01/27/22 1425   ? ? Visit Number 3   ? Number of Visits 17   ? Date for PT Re-Evaluation 03/18/22   ? Authorization Type [employer] OTHER;[place] MEDICAID [employer]   ? Progress Note Due on Visit 10   ? PT Start Time [id number]   ? PT Stop Time [id number]   ? PT Time Calculation (min) 41 min   ? Activity Tolerance Patient tolerated treatment well   ? Behavior During Therapy [facility] for tasks assessed/performed   ? ?  ?  ? ?  ? ? ? ?Past Medical History:  ?Diagnosis Date  ? Anemia   ? Anxiety   ? Chlamydia 10/2011  ? Cholecystitis   ? Closed head injury with concussion   ? Gestational hypertension   ? Headache(784.0)   ? Heart palpitations   ? Hypertension   ? ?Past Surgical History:  ?Procedure Laterality Date  ? ORIF ANKLE FRACTURE Left 02/12/2016  ? Procedure: OPEN REDUCTION INTERNAL FIXATION (ORIF) ANKLE FRACTURE;  Surgeon: Altamese Palestine, MD;  Location: [place];  Service: Orthopedics;  Laterality: Left;  ? ORIF TIBIA PLATEAU Right 11/26/2021  ? Procedure: OPEN REDUCTION INTERNAL FIXATION (ORIF) TIBIAL PLATEAU;  Surgeon: [name] Needles, MD;  Location: [place];  Service: Orthopedics;  Laterality: Right;  ? ?Patient Active Problem List  ? Diagnosis Date Noted  ? Right medial tibial plateau fracture 11/24/2021  ? Fetal pericardial effusion affecting management of mother 08/11/2017  ? Pre-eclampsia 07/31/2017  ? Group B streptococcus urinary tract infection affecting pregnancy 04/24/2017  ? Encounter for supervision of normal pregnancy, unspecified, unspecified trimester 04/10/2017  ? History of gestational hypertension 04/10/2017  ? Short interval between pregnancies affecting pregnancy, antepartum 04/10/2017  ? Murmur, cardiac 02/10/2016   ? ? ?REFERRING DIAG: Right medial tibial plateau fracture ? ?THERAPY DIAG:  ?No diagnosis found. ? ? ?SUBJECTIVE:  ?  ?SUBJECTIVE STATEMENT: ?Pt reports she had some R knee and ankle soreness yesterday after the last PT session, but it's feeling better than today. ?  ?PAIN:  ?Are you having pain? Yes: NPRS scale: 0/10 ?Pain location: Anterior knee pain ?Pain description: ache, throb ?Aggravating factors: Prolonged standing and walking 7/10 ?Relieving factors: Medications, moving it, rest ? ?PERTINENT HISTORY: ? Obesity, L ankle fx  ?  ?PRECAUTIONS: None ?  ?WEIGHT BEARING RESTRICTIONS  WBAT ?  ?FALLS:  ?Has patient fallen in last 6 months? Yes. Number of falls 1 ?  ?LIVING ENVIRONMENT: ?Lives with: lives with their family ?Lives in: Mobile home ?No issues with accessing home or mobility within home ?  ?OCCUPATION: [employer] ?  ?PLOF: Independent ?  ?PATIENT GOALS To be back to normal as much as posible, to walk more normally ?  ?  ?OBJECTIVE:  ?  ?DIAGNOSTIC FINDINGS: not available ?  ?PATIENT SURVEYS:  ?FOTO 36%  Functional status ?  ?COGNITION: ?          Overall cognitive status: Within functional limits for tasks assessed               ?           ?SENSATION: ?Light touch: NT pt wearing yoga pants ?  ?Swelling: ?  R knee appears swollen, difficult to assess with pt wearing yoga pants ?  ?POSTURE:  ?WNL ?  ?PALPATION: ?TTP of the anterior knee ?  ?LE ROM: ?  ?Active ROM Right ?01/14/2022 Left ?01/14/2022  ?Hip flexion      ?Hip extension      ?Hip abduction      ?Hip adduction      ?Hip internal rotation      ?Hip external rotation      ?Knee flexion 120 130  ?Knee extension 0 0  ?Ankle dorsiflexion      ?Ankle plantarflexion      ?Ankle inversion      ?Ankle eversion      ? (Blank rows = not tested) ?Ext lag R -15d, L -5d ?  ?LE MMT: ?  ?MMT Right ?01/14/2022 Left ?01/14/2022  ?Hip flexion 4 4+  ?Hip extension 4 5  ?Hip abduction 4+ 5  ?Hip adduction 5 5  ?Hip internal rotation 5 5  ?Hip external  rotation 4 5  ?Knee flexion 4+ 5  ?Knee extension 4+ 5  ?Ankle dorsiflexion 5 5  ?Ankle plantarflexion 5 5  ?Ankle inversion      ?Ankle eversion      ? (Blank rows = not tested) ?  ?FUNCTIONAL TESTS:  ?5 times sit to stand: 31.7 s use of hands, decreased wt bearing R LE ?2 minute walk test: 235 ft ?  ?  ?GAIT: ?Distance walked: 235 ft within clinic ?Assistive device utilized: Crutches pt left crutches in car ?Level of assistance: Complete Independence ?Comments: Pt left crutches in car. Pt walked with an antlgic gait over the R LE. Pt kept R knee straight ?  ?  ?  ?TODAY'S TREATMENT: ?[facility] Adult PT Treatment:                                                DATE: 01/27/22 ?Therapeutic Exercise: ?Nu step L5  5 mins ?Cybex Hip abd 3x8 25# ?Cybex hip ext 3x8 80# ?SL R balance x3 30sec ?R Lat step ups 2x10 2" airex on 2' box, initial uses of hands and then without hand use ?LAQ R LE 3x10 ? ?[facility] Adult PT Treatment:                                                DATE: 01/25/22 ?Therapeutic Exercise: ?Nu step L5  5 mins ?Heel slide10 reps - 10 hold ?Active Straight Leg Raise with Quad Set  x15 reps - 3 hold ?Bridging x15 reps - 3 hold ?Clamshell with Resistance  15 reps - 3 hold ?Sidelying Hip Abduction  15 reps - 3 hold ?Seated Long Arc Quad  15 reps - 3 hold 5# ?STS x 15 rep ?SLS R c hand A as needed x5 30 sec ? ?Therapeutic Activity: ?Gait training heel toe gait pattern. Pt weight shifts over the R LE appearing due to hip abd weakness ? ?Eval Treatment: ?Hip Flexion 10 reps - 10 hold ?Active Straight Leg Raise with Quad Set  10 reps - 3 hold ?Clamshell with Resistance  10 reps - 3 hold ?Sidelying Hip Abduction  10 reps - 3 hold ?Seated Long Arc Quad  10 reps - 3 hold ?  ?  ?  PATIENT EDUCATION:  ?Education details: Eval findings, POC, HEP, symptom management ?Person educated: Patient ?Education method: Explanation, Demonstration, Tactile cues, Verbal cues, and Handouts ?Education comprehension: verbalized understanding,  returned demonstration, verbal cues required, and tactile cues required ?  ?  ?HOME EXERCISE PROGRAM: ?Access Code: [email or web address] ?URL: [email or web address] ?Date: 01/25/2022 ?Prepared by: Gar Ponto ? ?Exercises ?- Hip Flexion  - 1 x daily - 7 x weekly - 1 sets - 10 reps - 10 hold ?- Active Straight Leg Raise with Quad Set  - 1 x daily - 7 x weekly - 2 sets - 10 reps - 3 hold ?- Clamshell with Resistance  - 1 x daily - 7 x weekly - 2 sets - 10 reps - 3 hold ?- Sidelying Hip Abduction  - 1 x daily - 7 x weekly - 2 sets - 10 reps - 3 hold ?- Seated Long Arc Quad  - 1 x daily - 7 x weekly - 2 sets - 10 reps - 3 hold ?- Supine Bridge  - 1 x daily - 7 x weekly - 2 sets - 10 reps - 3 hold ?- Sit to Stand Without Arm Support  - 1 x daily - 7 x weekly - 2 sets - 10 reps - 3 hold ? ?ASSESSMENT: ?  ?CLINICAL IMPRESSION: ?PT was completed for LE strengthening. PT was completed with physical greater demand. Pt tolerated the session without adverse effects. Gait appeared improved with decreased posting over the R LE during stance R. Pt will continue to benefit from skilled PT to address deficits to optimize function of the r LE.  ?  ?OBJECTIVE IMPAIRMENTS Abnormal gait, decreased activity tolerance, difficulty walking, decreased ROM, decreased strength, increased edema, obesity, and pain.   ?  ?REHAB POTENTIAL: Good ? ?  ?GOALS: ?  ?SHORT TERM GOALS: Target date: 02/04/2022 ?  ?Pt will be Ind in an initial HEP ?Baseline: started on eval ?Goal status: INITIAL ?  ?2.  Gait will progress to pt walking c 1 crutch or SPC the majority of the time ?Baseline: crutches ?Goal status: INITIAL ?  ?LONG TERM GOALS: Target date: 03/18/22 ?  ?Increase R hip strength to at least 4+/5 and R knee strength to 5/5 for improved function of the R LE ?Baseline: see flow sheet ?Goal status: INITIAL ?  ?2.  Increase R knee flexion ROM to 125d for improved R knee function  ?Baseline: see flow sheet ?Goal status: INITIAL ?  ?3.  R knee ext lag c  SAQ will increase to -5 for improved R LE function ?Baseline: -15 ?Goal status: INITIAL ?  ?4.  2MWT will increase to 450 ft as indication of improved function ?Baseline: 233 ft ?Goal status: INITIAL ?  ?5. Decrease

## 2022-01-27 ENCOUNTER — Ambulatory Visit: Payer: 59

## 2022-01-27 DIAGNOSIS — R262 Difficulty in walking, not elsewhere classified: Secondary | ICD-10-CM

## 2022-01-27 DIAGNOSIS — M6281 Muscle weakness (generalized): Secondary | ICD-10-CM

## 2022-02-01 ENCOUNTER — Encounter: Payer: Self-pay | Admitting: Physical Therapy

## 2022-02-01 ENCOUNTER — Ambulatory Visit: Payer: 59 | Attending: Student | Admitting: Physical Therapy

## 2022-02-01 DIAGNOSIS — R262 Difficulty in walking, not elsewhere classified: Secondary | ICD-10-CM | POA: Insufficient documentation

## 2022-02-01 DIAGNOSIS — S82131D Displaced fracture of medial condyle of right tibia, subsequent encounter for closed fracture with routine healing: Secondary | ICD-10-CM | POA: Diagnosis not present

## 2022-02-01 DIAGNOSIS — M6281 Muscle weakness (generalized): Secondary | ICD-10-CM | POA: Insufficient documentation

## 2022-02-01 DIAGNOSIS — S82141D Displaced bicondylar fracture of right tibia, subsequent encounter for closed fracture with routine healing: Secondary | ICD-10-CM | POA: Diagnosis not present

## 2022-02-01 NOTE — Therapy (Signed)
?OUTPATIENT PHYSICAL THERAPY TREATMENT NOTE ? ? ?[name] ?MRN: [id number] ?DOB:1990/11/20, [age] y.o., female ?Today's Date: 02/01/2022 ? ?PCP: Patient, No Pcp Per (Inactive) ?REFERRING PROVIDER: West BaliMcClung, Sarah A, PA-C ? ?END OF SESSION:  ? PT End of Session - 02/01/22 1236   ? ? Visit Number 4   ? Number of Visits 17   ? Date for PT Re-Evaluation 03/18/22   ? Authorization Type [employer] OTHER;[place] MEDICAID [employer]   ? Progress Note Due on Visit 10   ? PT Start Time 1232   ? PT Stop Time 1313   ? PT Time Calculation (min) 41 min   ? ?  ?  ? ?  ? ? ? ?Past Medical History:  ?Diagnosis Date  ? Anemia   ? Anxiety   ? Chlamydia 10/2011  ? Cholecystitis   ? Closed head injury with concussion   ? Gestational hypertension   ? Headache(784.0)   ? Heart palpitations   ? Hypertension   ? ?Past Surgical History:  ?Procedure Laterality Date  ? ORIF ANKLE FRACTURE Left 02/12/2016  ? Procedure: OPEN REDUCTION INTERNAL FIXATION (ORIF) ANKLE FRACTURE;  Surgeon: Myrene GalasMichael Handy, MD;  [facility];  Service: Orthopedics;  Laterality: Left;  ? ORIF TIBIA PLATEAU Right 11/26/2021  ? Procedure: OPEN REDUCTION INTERNAL FIXATION (ORIF) TIBIAL PLATEAU;  Surgeon: Roby LoftsHaddix, Kevin P, MD;  Location: [facility] OR;  Service: Orthopedics;  Laterality: Right;  ? ?Patient Active Problem List  ? Diagnosis Date Noted  ? Right medial tibial plateau fracture 11/24/2021  ? Fetal pericardial effusion affecting management of mother 08/11/2017  ? Pre-eclampsia 07/31/2017  ? Group B streptococcus urinary tract infection affecting pregnancy 04/24/2017  ? Encounter for supervision of normal pregnancy, unspecified, unspecified trimester 04/10/2017  ? History of gestational hypertension 04/10/2017  ? Short interval between pregnancies affecting pregnancy, antepartum 04/10/2017  ? Murmur, cardiac 02/10/2016  ? ? ?REFERRING DIAG: Right medial tibial plateau fracture ? ?THERAPY DIAG:  ?Muscle weakness (generalized) ? ?Difficulty in  walking, not elsewhere classified ? ? ?SUBJECTIVE:  ?  ?SUBJECTIVE STATEMENT: ?Pt reports no pain on arrival. She is trying to go without the crutches. She has never used one crutch.  ?  ?PAIN:  ?Are you having pain? Yes: NPRS scale: 0/10 ?Pain location: Anterior knee pain ?Pain description: ache, throb ?Aggravating factors: Prolonged standing and walking 7/10 ?Relieving factors: Medications, moving it, rest ? ?PERTINENT HISTORY: ? Obesity, L ankle fx  ?  ?PRECAUTIONS: None ?  ?WEIGHT BEARING RESTRICTIONS  WBAT ?  ?FALLS:  ?Has patient fallen in last 6 months? Yes. Number of falls 1 ?  ?LIVING ENVIRONMENT: ?Lives with: lives with their family ?Lives in: Mobile home ?No issues with accessing home or mobility within home ?  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  ?PLOF: Independent ?  ?PATIENT GOALS To be back to normal as much as posible, to walk more normally ?  ?  ?OBJECTIVE:  ?  ?DIAGNOSTIC FINDINGS: not available ?  ?PATIENT SURVEYS:  ?FOTO 36%  Functional status ?  ?COGNITION: ?          Overall cognitive status: Within functional limits for tasks assessed               ?           ?SENSATION: ?Light touch: NT pt wearing yoga pants ?  ?Swelling: ?           R knee appears swollen, difficult to assess with pt wearing yoga  pants ?  ?POSTURE:  ?WNL ?  ?PALPATION: ?TTP of the anterior knee ?  ?LE ROM: ?  ?Active ROM Right ?01/14/2022 Left ?01/14/2022  ?Hip flexion      ?Hip extension      ?Hip abduction      ?Hip adduction      ?Hip internal rotation      ?Hip external rotation      ?Knee flexion 120 130  ?Knee extension 0 0  ?Ankle dorsiflexion      ?Ankle plantarflexion      ?Ankle inversion      ?Ankle eversion      ? (Blank rows = not tested) ?Ext lag R -15d, L -5d ?  ?LE MMT: ?  ?MMT Right ?01/14/2022 Left ?01/14/2022  ?Hip flexion 4 4+  ?Hip extension 4 5  ?Hip abduction 4+ 5  ?Hip adduction 5 5  ?Hip internal rotation 5 5  ?Hip external rotation 4 5  ?Knee flexion 4+ 5  ?Knee extension 4+ 5  ?Ankle dorsiflexion 5 5   ?Ankle plantarflexion 5 5  ?Ankle inversion      ?Ankle eversion      ? (Blank rows = not tested) ?  ?FUNCTIONAL TESTS:  ?5 times sit to stand: 31.7 s use of hands, decreased wt bearing R LE ?2 minute walk test: 235 ft ?  ?  ?GAIT: ?Distance walked: 235 ft within clinic ?Assistive device utilized: Crutches pt left crutches in car ?Level of assistance: Complete Independence ?Comments: Pt left crutches in car. Pt walked with an antlgic gait over the R LE. Pt kept R knee straight ?  ?  ?  ?TODAY'S TREATMENT: ?[facility] Adult PT Treatment:                                                DATE: 02/01/22 ?Therapeutic Exercise: ?Nu step L5  5 mins ?Gait training- see below ?Right hip abduction 10 x 3 ?Right SLR x 15 , x 10 with 2#  ?Bridge x 15  ?Banded bridge blue x 15  ?STSeated Long [employer]  15 reps - 3 hold 5# ?S x 15 rep- blue band x 15  ?SLS R c hand A as needed x2 30 sec, tandem x 30 sec ?4 inch step up lateral x 10 ?4inch left hip hike with left hip abduction x 10  ?Seated childs pose with ball to decrease LBP  ? ?Therapeutic Activity: ?Gait training heel toe gait pattern with and without crutch. Pt weight shifts over the R LE appearing due to hip abd weakness ? ?[facility] Adult PT Treatment:                                                DATE: 01/27/22 ?Therapeutic Exercise: ?Nu step L5  5 mins ?Cybex Hip abd 3x8 25# ?Cybex hip ext 3x8 80# ?SL R balance x3 30sec ?R Lat step ups 2x10 2" airex on 2' box, initial uses of hands and then without hand use ?LAQ R LE 3x10 ? ?[facility] Adult PT Treatment:  DATE: 01/25/22 ?Therapeutic Exercise: ?Nu step L5  5 mins ?Heel slide10 reps - 10 hold ?Active Straight Leg Raise with Quad Set  x15 reps - 3 hold ?Bridging x15 reps - 3 hold ?Clamshell with Resistance  15 reps - 3 hold ?Sidelying Hip Abduction  15 reps - 3 hold ?Seated Long Arc Quad  15 reps - 3 hold 5# ?STS x 15 rep ?SLS R c hand A as needed x5 30 sec ? ?Therapeutic Activity: ?Gait training  heel toe gait pattern. Pt weight shifts over the R LE appearing due to hip abd weakness ? ?Eval Treatment: ?Hip Flexion 10 reps - 10 hold ?Active Straight Leg Raise with Quad Set  10 reps - 3 hold ?Clamshell with Resistance  10 reps - 3 hold ?Sidelying Hip Abduction  10 reps - 3 hold ?Seated Long Arc Quad  10 reps - 3 hold ?  ?  ?PATIENT EDUCATION:  ?Education details: Eval findings, POC, HEP, symptom management ?Person educated: Patient ?Education method: Explanation, Demonstration, Tactile cues, Verbal cues, and Handouts ?Education comprehension: verbalized understanding, returned demonstration, verbal cues required, and tactile cues required ?  ?  ?HOME EXERCISE PROGRAM: ?Access Code: [email or web address] ?URL: [email or web address] ?Date: 01/25/2022 ?Prepared by: Joellyn Rued ? ?Exercises ?- Hip Flexion  - 1 x daily - 7 x weekly - 1 sets - 10 reps - 10 hold ?- Active Straight Leg Raise with Quad Set  - 1 x daily - 7 x weekly - 2 sets - 10 reps - 3 hold ?- Clamshell with Resistance  - 1 x daily - 7 x weekly - 2 sets - 10 reps - 3 hold ?- Sidelying Hip Abduction  - 1 x daily - 7 x weekly - 2 sets - 10 reps - 3 hold ?- Seated Long Arc Quad  - 1 x daily - 7 x weekly - 2 sets - 10 reps - 3 hold ?- Supine Bridge  - 1 x daily - 7 x weekly - 2 sets - 10 reps - 3 hold ?- Sit to Stand Without Arm Support  - 1 x daily - 7 x weekly - 2 sets - 10 reps - 3 hold ? ?ASSESSMENT: ?  ?CLINICAL IMPRESSION: ?PT was completed for LE strengthening. PT was completed with physical greater demand. Pt tolerated the session without adverse effects.  Pt arrived with antalgic pattern. Was instructed on the use of 1 crutch or SPC. She was able to decrease deviations with cues. She reports increased back pain with gait training. Also had increased back pain with other closed chain therex today. Modified childs pose was helpful at end of session.  Continued with closed chain and open chain strengthening to target quad and hips.  Pt will  continue to benefit from skilled PT to address deficits to optimize function of the r LE.  ?  ?OBJECTIVE IMPAIRMENTS Abnormal gait, decreased activity tolerance, difficulty walking, decreased ROM, decreased strengt

## 2022-02-03 ENCOUNTER — Ambulatory Visit: Payer: 59 | Admitting: Physical Therapy

## 2022-02-03 ENCOUNTER — Encounter: Payer: Self-pay | Admitting: Physical Therapy

## 2022-02-03 DIAGNOSIS — M6281 Muscle weakness (generalized): Secondary | ICD-10-CM

## 2022-02-03 DIAGNOSIS — R262 Difficulty in walking, not elsewhere classified: Secondary | ICD-10-CM

## 2022-02-03 NOTE — Therapy (Signed)
?OUTPATIENT PHYSICAL THERAPY TREATMENT NOTE ? ? ?[name] ?MRN: [id number] ?DOB:[birth year], 31 y.o., female ?Today's Date: 02/03/2022 ? ?PCP: Patient, No Pcp Per (Inactive) ?REFERRING PROVIDER: West Bali, PA-C ? ?END OF SESSION:  ? PT End of Session - 02/03/22 1233   ? ? Visit Number 5   ? Number of Visits 17   ? Date for PT Re-Evaluation 03/18/22   ? Authorization Type [employer] OTHER; MEDICAID [employer]   ? Progress Note Due on Visit 10   ? PT Start Time 1231   ? PT Stop Time 1314   ? PT Time Calculation (min) 43 min   ? ?  ?  ? ?  ? ? ? ?Past Medical History:  ?Diagnosis Date  ? Anemia   ? Anxiety   ? Chlamydia 10/2011  ? Cholecystitis   ? Closed head injury with concussion   ? Gestational hypertension   ? Headache(784.0)   ? Heart palpitations   ? Hypertension   ? ?Past Surgical History:  ?Procedure Laterality Date  ? ORIF ANKLE FRACTURE Left 02/12/2016  ? Procedure: OPEN REDUCTION INTERNAL FIXATION (ORIF) ANKLE FRACTURE;  Surgeon: Myrene Galas, MD;  Location: [facility] OR;  Service: Orthopedics;  Laterality: Left;  ? ORIF TIBIA PLATEAU Right 11/26/2021  ? Procedure: OPEN REDUCTION INTERNAL FIXATION (ORIF) TIBIAL PLATEAU;  Surgeon: Roby Lofts, MD;  Location: [facility] OR;  Service: Orthopedics;  Laterality: Right;  ? ?Patient Active Problem List  ? Diagnosis Date Noted  ? Right medial tibial plateau fracture 11/24/2021  ? Fetal pericardial effusion affecting management of mother 08/11/2017  ? Pre-eclampsia 07/31/2017  ? Group B streptococcus urinary tract infection affecting pregnancy 04/24/2017  ? Encounter for supervision of normal pregnancy, unspecified, unspecified trimester 04/10/2017  ? History of gestational hypertension 04/10/2017  ? Short interval between pregnancies affecting pregnancy, antepartum 04/10/2017  ? Murmur, cardiac 02/10/2016  ? ? ?REFERRING DIAG: Right medial tibial plateau fracture ? ?THERAPY DIAG:  ?Muscle weakness (generalized) ? ?Difficulty in  walking, not elsewhere classified ? ? ?SUBJECTIVE:  ?  ?SUBJECTIVE STATEMENT: ?Pt reports 4/10 pain on arrival. She is working on her walking without a limp.  ?  ?PAIN:  ?Are you having pain? Yes: NPRS scale: 4/10 ?Pain location: Anterior knee pain ?Pain description: ache, throb ?Aggravating factors: Prolonged standing and walking 7/10 ?Relieving factors: Medications, moving it, rest ? ?PERTINENT HISTORY: ? Obesity, L ankle fx  ?  ?PRECAUTIONS: None ?  ?WEIGHT BEARING RESTRICTIONS  WBAT ?  ?FALLS:  ?Has patient fallen in last 6 months? Yes. Number of falls 1 ?  ?LIVING ENVIRONMENT: ?Lives with: lives with their family ?Lives in: Mobile home ?No issues with accessing home or mobility within home ?  ?OCCUPATION: [employer] ?  ?PLOF: Independent ?  ?PATIENT GOALS To be back to normal as much as posible, to walk more normally ?  ?  ?OBJECTIVE:  ?  ?DIAGNOSTIC FINDINGS: not available ?  ?PATIENT SURVEYS:  ?FOTO 36%  Functional status ?  ?COGNITION: ?          Overall cognitive status: Within functional limits for tasks assessed               ?           ?SENSATION: ?Light touch: NT pt wearing yoga pants ?  ?Swelling: ?           R knee appears swollen, difficult to assess with pt wearing yoga pants ?  ?POSTURE:  ?  WNL ?  ?PALPATION: ?TTP of the anterior knee ?  ?LE ROM: ?  ?Active ROM Right ?01/14/2022 Left ?01/14/2022 Right  ?02/03/22  ?Hip flexion       ?Hip extension       ?Hip abduction       ?Hip adduction       ?Hip internal rotation       ?Hip external rotation       ?Knee flexion 120 130 135  ?Knee extension 0 0   ?Ankle dorsiflexion       ?Ankle plantarflexion       ?Ankle inversion       ?Ankle eversion       ? (Blank rows = not tested) ?Ext lag R -15d, L -5d ?  ?LE MMT: ?  ?MMT Right ?01/14/2022 Left ?01/14/2022  ?Hip flexion 4 4+  ?Hip extension 4 5  ?Hip abduction 4+ 5  ?Hip adduction 5 5  ?Hip internal rotation 5 5  ?Hip external rotation 4 5  ?Knee flexion 4+ 5  ?Knee extension 4+ 5  ?Ankle dorsiflexion 5 5   ?Ankle plantarflexion 5 5  ?Ankle inversion      ?Ankle eversion      ? (Blank rows = not tested) ?  ?FUNCTIONAL TESTS:  ?5 times sit to stand: 31.7 s use of hands, decreased wt bearing R LE ?2 minute walk test: 235 ft ?  ?  ?GAIT: ?Distance walked: 235 ft within clinic ?Assistive device utilized: Crutches pt left crutches in car ?Level of assistance: Complete Independence ?Comments: Pt left crutches in car. Pt walked with an antlgic gait over the R LE. Pt kept R knee straight ?  ?  ?  ?TODAY'S TREATMENT: ?[facility] Adult PT Treatment:                                                DATE: 02/03/22 ?Therapeutic Exercise: ?Nu step L6  5 min- LE only  ?Gait training- see below ?SLS trials at counter- postural sway and 5 sec best  ?R SLS with left hip hikes  ?Knee Extension OMEGA 5# eccentric lowering  ?Knee Flexion OMEGA 15# eccentric returns ?STS with blue band x 15 ?Right hip abduction 10 x 3 ?Right SLR x 15 x 2 ?Right hip flexor stretch 2 x 30 sec  ?Right single leg bridge 10 x 2  ? ?Therapeutic Activity: ?Gait training heel toe gait pattern  without crutch. Pt weight shifts over the R LE appearing due to hip abd weakness ? ?[facility] Adult PT Treatment:                                                DATE: 02/01/22 ?Therapeutic Exercise: ?Nu step L5  5 mins ?Gait training- see below ?Right hip abduction 10 x 3 ?Right SLR x 15 , x 10 with 2#  ?Bridge x 15  ?Banded bridge blue x 15  ?STSeated Long [employer]  15 reps - 3 hold 5# ?S x 15 rep- blue band x 15  ?SLS R c hand A as needed x2 30 sec, tandem x 30 sec ?4 inch step up lateral x 10 ?4inch left hip hike with left hip abduction x 10  ?Seated  childs pose with ball to decrease LBP  ? ?Therapeutic Activity: ?Gait training heel toe gait pattern with and without crutch. Pt weight shifts over the R LE appearing due to hip abd weakness ? ?[facility]                                                DATE: 01/27/[facility] ?Therapeutic Exercise: ?Nu step L5  5 mins ?Cybex Hip abd 3x8  25# ?Cybex hip ext 3x8 80# ?SL R balance x3 30sec ?R Lat step ups 2x10 2" airex on 2' box, initial uses of hands and then without hand use ?LAQ R LE 3x10 ? ?[facility]:                                                DATE: 01/25/22 ?Therapeutic Exercise: ?Nu step L5  5 mins ?Heel slide10 reps - 10 hold ?Active Straight Leg Raise with Quad Set  x15 reps - 3 hold ?Bridging x15 reps - 3 hold ?Clamshell with Resistance  15 reps - 3 hold ?Sidelying Hip Abduction  15 reps - 3 hold ?Seated Long Arc Quad  15 reps - 3 hold 5# ?STS x 15 rep ?SLS R c hand A as needed x5 30 sec ? ?Therapeutic Activity: ?Gait training heel toe gait pattern. Pt weight shifts over the R LE appearing due to hip abd weakness ? ?Eval Treatment: ?Hip Flexion 10 reps - 10 hold ?Active Straight Leg Raise with Quad Set  10 reps - 3 hold ?Clamshell with Resistance  10 reps - 3 hold ?Sidelying Hip Abduction  10 reps - 3 hold ?Seated Long Arc Quad  10 reps - 3 hold ?  ?  ?PATIENT EDUCATION:  ?Education details: Eval findings, POC, HEP, symptom management ?Person educated: Patient ?Education method: Explanation, Demonstration, Tactile cues, Verbal cues, and Handouts ?Education comprehension: verbalized understanding, returned demonstration, verbal cues required, and tactile cues required ?  ?  ?HOME EXERCISE PROGRAM: ?Access Code: [email or web address] ?URL: [email or web address] ?Date: 01/25/2022 ?Prepared by: Joellyn RuedAllen Ralls ? ?Exercises ?- Hip Flexion  - 1 x daily - 7 x weekly - 1 sets - 10 reps - 10 hold ?- Active Straight Leg Raise with Quad Set  - 1 x daily - 7 x weekly - 2 sets - 10 reps - 3 hold ?- Clamshell with Resistance  - 1 x daily - 7 x weekly - 2 sets - 10 reps - 3 hold ?- Sidelying Hip Abduction  - 1 x daily - 7 x weekly - 2 sets - 10 reps - 3 hold ?- Seated Long Arc Quad  - 1 x daily - 7 x weekly - 2 sets - 10 reps - 3 hold ?- Supine Bridge  - 1 x daily - 7 x weekly - 2 sets - 10 reps - 3 hold ?- Sit to Stand Without Arm Support  - 1  x daily - 7 x weekly - 2 sets - 10 reps - 3 hold ? ?ASSESSMENT: ?  ?CLINICAL IMPRESSION: ?PT was completed for LE strengthening and gait training. Pt tolerated the session without adverse effects.  Gait

## 2022-02-08 ENCOUNTER — Encounter: Payer: Self-pay | Admitting: Physical Therapy

## 2022-02-08 ENCOUNTER — Ambulatory Visit: Payer: 59 | Admitting: Physical Therapy

## 2022-02-08 DIAGNOSIS — M6281 Muscle weakness (generalized): Secondary | ICD-10-CM | POA: Diagnosis not present

## 2022-02-08 DIAGNOSIS — R262 Difficulty in walking, not elsewhere classified: Secondary | ICD-10-CM

## 2022-02-08 NOTE — Therapy (Signed)
?OUTPATIENT PHYSICAL THERAPY TREATMENT NOTE ? ? ?[name] ?MRN: [id number] ?DOB:1991-04-20, 31 y.o., female ?Today's Date: 02/08/2022 ? ?PCP: Patient, No Pcp Per (Inactive) ?REFERRING PROVIDER: West Bali, PA-C ? ?END OF SESSION:  ? PT End of Session - 02/08/22 1151   ? ? Visit Number 6   ? Number of Visits 17   ? Date for PT Re-Evaluation 03/18/22   ? Authorization Type [employer] OTHER;[place] MEDICAID [employer]   ? Progress Note Due on Visit 10   ? PT Start Time 1148   ? PT Stop Time 1230   ? PT Time Calculation (min) 42 min   ? ?  ?  ? ?  ? ? ? ?Past Medical History:  ?Diagnosis Date  ? Anemia   ? Anxiety   ? Chlamydia 10/2011  ? Cholecystitis   ? Closed head injury with concussion   ? Gestational hypertension   ? Headache(784.0)   ? Heart palpitations   ? Hypertension   ? ?Past Surgical History:  ?Procedure Laterality Date  ? ORIF ANKLE FRACTURE Left 02/12/2016  ? Procedure: OPEN REDUCTION INTERNAL FIXATION (ORIF) ANKLE FRACTURE;  Surgeon: Myrene Galas, MD;  Location: [facility] OR;  Service: Orthopedics;  Laterality: Left;  ? ORIF TIBIA PLATEAU Right 11/26/2021  ? Procedure: OPEN REDUCTION INTERNAL FIXATION (ORIF) TIBIAL PLATEAU;  Surgeon: Roby Lofts, MD;  Location: [facility] OR;  Service: Orthopedics;  Laterality: Right;  ? ?Patient Active Problem List  ? Diagnosis Date Noted  ? Right medial tibial plateau fracture 11/24/2021  ? Fetal pericardial effusion affecting management of mother 08/11/2017  ? Pre-eclampsia 07/31/2017  ? Group B streptococcus urinary tract infection affecting pregnancy 04/24/2017  ? Encounter for supervision of normal pregnancy, unspecified, unspecified trimester 04/10/2017  ? History of gestational hypertension 04/10/2017  ? Short interval between pregnancies affecting pregnancy, antepartum 04/10/2017  ? Murmur, cardiac 02/10/2016  ? ? ?REFERRING DIAG: Right medial tibial plateau fracture ? ?THERAPY DIAG:  ?Muscle weakness (generalized) ? ?Difficulty in  walking, not elsewhere classified ? ? ?SUBJECTIVE:  ?  ?SUBJECTIVE STATEMENT: ?Pt reports 6/10 pain on arrival in knee. Low back and hip are 8/10.  She reports walking a lot and now has increased pain and limping more.  ?  ?PAIN:  ?Are you having pain? Yes: NPRS scale: 6/10 ?Pain location: Anterior knee pain ?Pain description: ache, throb ?Aggravating factors: Prolonged standing and walking 7/10 ?Relieving factors: Medications, moving it, rest ? ?PERTINENT HISTORY: ? Obesity, L ankle fx  ?  ?PRECAUTIONS: None ?  ?WEIGHT BEARING RESTRICTIONS  WBAT ?  ?FALLS:  ?Has patient fallen in last 6 months? Yes. Number of falls 1 ?  ?LIVING ENVIRONMENT: ?Lives with: lives with their family ?Lives in: Mobile home ?No issues with accessing home or mobility within home ?  ?OCCUPATION: [employer] ?  ?PLOF: Independent ?  ?PATIENT GOALS To be back to normal as much as posible, to walk more normally ?  ?  ?OBJECTIVE:  ?  ?DIAGNOSTIC FINDINGS: not available ?  ?PATIENT SURVEYS:  ?FOTO 36%  Functional status ?  ?COGNITION: ?          Overall cognitive status: Within functional limits for tasks assessed               ?           ?SENSATION: ?Light touch: NT pt wearing yoga pants ?  ?Swelling: ?           R knee appears  swollen, difficult to assess with pt wearing yoga pants ?  ?POSTURE:  ?WNL ?  ?PALPATION: ?TTP of the anterior knee ?  ?LE ROM: ?  ?Active ROM Right ?01/14/2022 Left ?01/14/2022 Right  ?02/03/22  ?Hip flexion       ?Hip extension       ?Hip abduction       ?Hip adduction       ?Hip internal rotation       ?Hip external rotation       ?Knee flexion 120 130 135  ?Knee extension 0 0   ?Ankle dorsiflexion       ?Ankle plantarflexion       ?Ankle inversion       ?Ankle eversion       ? (Blank rows = not tested) ?Ext lag R -15d, L -5d ?  ?LE MMT: ?  ?MMT Right ?01/14/2022 Left ?01/14/2022  ?Hip flexion 4 4+  ?Hip extension 4 5  ?Hip abduction 4+ 5  ?Hip adduction 5 5  ?Hip internal rotation 5 5  ?Hip external rotation 4 5   ?Knee flexion 4+ 5  ?Knee extension 4+ 5  ?Ankle dorsiflexion 5 5  ?Ankle plantarflexion 5 5  ?Ankle inversion      ?Ankle eversion      ? (Blank rows = not tested) ?  ?FUNCTIONAL TESTS:  ?5 times sit to stand: 31.7 s use of hands, decreased wt bearing R LE ?2 minute walk test: 235 ft ?  ?  ?GAIT: ?Distance walked: 235 ft within clinic ?Assistive device utilized: Crutches pt left crutches in car ?Level of assistance: Complete Independence ?Comments: Pt left crutches in car. Pt walked with an antlgic gait over the R LE. Pt kept R knee straight ?  ?  ?  ?TODAY'S TREATMENT: ?[facility] Adult PT Treatment:                                                DATE: 02/08/22 ?Therapeutic Exercise: ?Nu step L6  5 min- LE only  ?SKTC 2 x 30 sec each ?Right hip flexor stretch with strap 3 x 30 sec  ?Bilateral  hamstring stretch with strap  ?Open book stretch bilat  ?Figure 4 push and pull bilateral  ?Right single leg bridge 10 x 2  ?Right SLR x 15 x 2 ?Right hip abduction 15 x 2  ? ?Therapeutic Activity: ?Gait training heel toe gait pattern  without crutch. Pt weight shifts over the R LE appearing due to hip abd weakness, AD used today to decrease antalgic pattern.  ? ?[facility] Adult PT Treatment:                                                DATE: 02/03/22 ?Therapeutic Exercise: ?Nu step L6  5 min- LE only  ?Gait training- see below ?SLS trials at counter- postural sway and 5 sec best  ?R SLS with left hip hikes  ?Knee Extension OMEGA 5# eccentric lowering  ?Knee Flexion OMEGA 15# eccentric returns ?STS with blue band x 15 ?Right hip abduction 10 x 3 ?Right SLR x 15 x 2 ?Right hip flexor stretch 2 x 30 sec  ?Right single leg bridge 10 x 2  ? ?Therapeutic  Activity: ?Gait training heel toe gait pattern  without crutch. Pt weight shifts over the R LE appearing due to hip abd weakness ? ?[facility]                                                DATE: 02/01/22 ?Therapeutic Exercise: ?Nu step L5  5 mins ?Gait training- see  below ?Right hip abduction 10 x 3 ?Right SLR x 15 , x 10 with 2#  ?Bridge x 15  ?Banded bridge blue x 15  ?STSeated Long [employer] Quad  15 reps - 3 hold 5# ?S x 15 rep- blue band x 15  ?SLS R c hand A as needed x2 30 sec, tandem x 30 sec ?4 inch step up lateral x 10 ?4inch left hip hike with left hip abduction x 10  ?Seated childs pose with ball to decrease LBP  ? ?Therapeutic Activity: ?Gait training heel toe gait pattern with and without crutch. Pt weight shifts over the R LE appearing due to hip abd weakness ? ?[facility]                                                DATE: 01/27/22 ?Therapeutic Exercise: ?Nu step L5  5 mins ?Cybex Hip abd 3x8 25# ?Cybex hip ext 3x8 80# ?SL R balance x3 30sec ?R Lat step ups 2x10 2" airex on 2' box, initial uses of hands and then without hand use ?LAQ R LE 3x10 ? ?[facility]:                                                DATE: 01/25/22 ?Therapeutic Exercise: ?Nu step L5  5 mins ?Heel slide10 reps - 10 hold ?Active Straight Leg Raise with Quad Set  x15 reps - 3 hold ?Bridging x15 reps - 3 hold ?Clamshell with Resistance  15 reps - 3 hold ?Sidelying Hip Abduction  15 reps - 3 hold ?Seated Long Arc Quad  15 reps - 3 hold 5# ?STS x 15 rep ?SLS R c hand A as needed x5 30 sec ? ?Therapeutic Activity: ?Gait training heel toe gait pattern. Pt weight shifts over the R LE appearing due to hip abd weakness ? ?Eval Treatment: ?Hip Flexion 10 reps - 10 hold ?Active Straight Leg Raise with Quad Set  10 reps - 3 hold ?Clamshell with Resistance  10 reps - 3 hold ?Sidelying Hip Abduction  10 reps - 3 hold ?Seated Long Arc Quad  10 reps - 3 hold ?  ?  ?PATIENT EDUCATION:  ?Education details: Eval findings, POC, HEP, symptom management ?Person educated: Patient ?Education method: Explanation, Demonstration, Tactile cues, Verbal cues, and Handouts ?Education comprehension: verbalized understanding, returned demonstration, verbal cues required, and tactile cues required ?  ?   ?HOME EXERCISE PROGRAM: ?Access Code: [email or web address] ?URL: [email or web address] ?Date: 01/25/2022 ?Prepared by: Joellyn RuedAllen Ralls ? ?Exercises ?- Hip Flexion  - 1 x daily - 7 x weekly - 1 sets - 10 reps - 10 hold ?- Ac

## 2022-02-11 ENCOUNTER — Encounter: Payer: 59 | Admitting: Physical Therapy

## 2022-02-16 ENCOUNTER — Ambulatory Visit: Payer: 59

## 2022-02-16 DIAGNOSIS — M6281 Muscle weakness (generalized): Secondary | ICD-10-CM | POA: Diagnosis not present

## 2022-02-16 DIAGNOSIS — R262 Difficulty in walking, not elsewhere classified: Secondary | ICD-10-CM

## 2022-02-16 NOTE — Therapy (Signed)
?OUTPATIENT PHYSICAL THERAPY TREATMENT NOTE ? ? ?[name] ?MRN: [id number] ?DOB:Jul 12, 1991, 31 y.o., female ?Today's Date: 02/16/2022 ? ?PCP: Patient, No Pcp Per (Inactive) ?REFERRING PROVIDER: [place], PA-C ? ?END OF SESSION:  ? PT End of Session - 02/16/22 1616   ? ? Visit Number 7   ? Number of Visits 17   ? Date for PT Re-Evaluation 03/18/22   ? Authorization Type [employer] OTHER;[place] MEDICAID [employer]   ? Progress Note Due on Visit 10   ? PT Start Time 1615   ? PT Stop Time 1700   ? PT Time Calculation (min) 45 min   ? Activity Tolerance Patient tolerated treatment well   ? Behavior During Therapy [facility] for tasks assessed/performed   ? ?  ?  ? ?  ? ? ? ? ?Past Medical History:  ?Diagnosis Date  ? Anemia   ? Anxiety   ? Chlamydia 10/2011  ? Cholecystitis   ? Closed head injury with concussion   ? Gestational hypertension   ? Headache(784.0)   ? Heart palpitations   ? Hypertension   ? ?Past Surgical History:  ?Procedure Laterality Date  ? ORIF ANKLE FRACTURE Left 02/12/2016  ? Procedure: OPEN REDUCTION INTERNAL FIXATION (ORIF) ANKLE FRACTURE;  Surgeon: Myrene Galas, MD;  Location: [facility] OR;  Service: Orthopedics;  Laterality: Left;  ? ORIF TIBIA PLATEAU Right 11/26/2021  ? Procedure: OPEN REDUCTION INTERNAL FIXATION (ORIF) TIBIAL PLATEAU;  Surgeon: Roby Lofts, MD;  Location: [facility] OR;  Service: Orthopedics;  Laterality: Right;  ? ?Patient Active Problem List  ? Diagnosis Date Noted  ? Right medial tibial plateau fracture 11/24/2021  ? Fetal pericardial effusion affecting management of mother 08/11/2017  ? Pre-eclampsia 07/31/2017  ? Group B streptococcus urinary tract infection affecting pregnancy 04/24/2017  ? Encounter for supervision of normal pregnancy, unspecified, unspecified trimester 04/10/2017  ? History of gestational hypertension 04/10/2017  ? Short interval between pregnancies affecting pregnancy, antepartum 04/10/2017  ? Murmur, cardiac 02/10/2016   ? ? ?REFERRING DIAG: Right medial tibial plateau fracture ? ?THERAPY DIAG:  ?Muscle weakness (generalized) ? ?Difficulty in walking, not elsewhere classified ? ? ?SUBJECTIVE:  ?  ?SUBJECTIVE STATEMENT: ?Pt reports 7/10 pain on arrival in knee. She reports HEP compliance. She states that she recently returned to work which has been exacerbating her pain. ? ?PAIN:  ?Are you having pain? Yes: NPRS scale: 7/10 ?Pain location: Anterior knee pain ?Pain description: ache, throb ?Aggravating factors: Prolonged standing and walking 7/10 ?Relieving factors: Medications, moving it, rest ? ?PERTINENT HISTORY: ? Obesity, L ankle fx  ?  ?PRECAUTIONS: None ?  ?WEIGHT BEARING RESTRICTIONS  WBAT ?  ?FALLS:  ?Has patient fallen in last 6 months? Yes. Number of falls 1 ?  ?LIVING ENVIRONMENT: ?Lives with: lives with their family ?Lives in: Mobile home ?No issues with accessing home or mobility within home ?  ?OCCUPATION: [employer] ?  ?PLOF: Independent ?  ?PATIENT GOALS To be back to normal as much as posible, to walk more normally ?  ?  ?OBJECTIVE:  ?  ?DIAGNOSTIC FINDINGS: not available ?  ?PATIENT SURVEYS:  ?FOTO 36%  Functional status ?  ?COGNITION: ?          Overall cognitive status: Within functional limits for tasks assessed               ?           ?SENSATION: ?Light touch: NT pt wearing yoga pants ?  ?  Swelling: ?           R knee appears swollen, difficult to assess with pt wearing yoga pants ?  ?POSTURE:  ?WNL ?  ?PALPATION: ?TTP of the anterior knee ?  ?LE ROM: ?  ?Active ROM Right ?01/14/2022 Left ?01/14/2022 Right  ?02/03/22  ?Hip flexion       ?Hip extension       ?Hip abduction       ?Hip adduction       ?Hip internal rotation       ?Hip external rotation       ?Knee flexion 120 130 135  ?Knee extension 0 0   ?Ankle dorsiflexion       ?Ankle plantarflexion       ?Ankle inversion       ?Ankle eversion       ? (Blank rows = not tested) ?Ext lag R -15d, L -5d ?  ?LE MMT: ?  ?MMT Right ?01/14/2022 Left ?01/14/2022  ?Hip  flexion 4 4+  ?Hip extension 4 5  ?Hip abduction 4+ 5  ?Hip adduction 5 5  ?Hip internal rotation 5 5  ?Hip external rotation 4 5  ?Knee flexion 4+ 5  ?Knee extension 4+ 5  ?Ankle dorsiflexion 5 5  ?Ankle plantarflexion 5 5  ?Ankle inversion      ?Ankle eversion      ? (Blank rows = not tested) ?  ?FUNCTIONAL TESTS:  ?5 times sit to stand: 31.7 s use of hands, decreased wt bearing R LE ?02/16/2022: 17.52 with no UE use  ?2 minute walk test: 235 ft ?  ?  ?GAIT: ?Distance walked: 235 ft within clinic ?Assistive device utilized: Crutches pt left crutches in car ?Level of assistance: Complete Independence ?Comments: Pt left crutches in car. Pt walked with an antlgic gait over the R LE. Pt kept R knee straight ?  ?  ?  ?TODAY'S TREATMENT: ?[facility] Adult PT Treatment:                                                DATE: 02/16/2022 ?Therapeutic Exercise: ?Nustep level 5 x 5 mins ?SKTC 2x30" each ?R hip flexor stretch with strap 3x30" ?Supine hamstring stretch with strap 2x30" BIL ?Open books x10 BIL ?Rt SLR 2x15 ?Rt single leg bridge 2x10 ?Bridges 2x10 ?Sidelying hip abduction Rt 2x15 ?Rows GTB 2x10 ?Shoulder extension GTB 2x10 ?5x STS 17.52 seconds ?Therapeutic Activity: ?Gait training heel toe gait pattern without crutch, encouraging equal stance time on R and L x 1 lap in clinic ? ? ?[facility] Adult PT Treatment:                                                DATE: 02/08/22 ?Therapeutic Exercise: ?Nu step L6  5 min- LE only  ?SKTC 2 x 30 sec each ?Right hip flexor stretch with strap 3 x 30 sec  ?Bilateral  hamstring stretch with strap  ?Open book stretch bilat  ?Figure 4 push and pull bilateral  ?Right single leg bridge 10 x 2  ?Right SLR x 15 x 2 ?Right hip abduction 15 x 2  ? ?Therapeutic Activity: ?Gait training heel toe gait pattern  without crutch. Pt weight shifts  over the R LE appearing due to hip abd weakness, AD used today to decrease antalgic pattern.  ? ?[facility] Adult PT Treatment:                                                 DATE: 02/03/22 ?Therapeutic Exercise: ?Nu step L6  5 min- LE only  ?Gait training- see below ?SLS trials at counter- postural sway and 5 sec best  ?R SLS with left hip hikes  ?Knee Extension OMEGA 5# eccentric lowering  ?Knee Flexion OMEGA 15# eccentric returns ?STS with blue band x 15 ?Right hip abduction 10 x 3 ?Right SLR x 15 x 2 ?Right hip flexor stretch 2 x 30 sec  ?Right single leg bridge 10 x 2  ? ?Therapeutic Activity: ?Gait training heel toe gait pattern  without crutch. Pt weight shifts over the R LE appearing due to hip abd weakness ? ? ? ?Eval Treatment: ?Hip Flexion 10 reps - 10 hold ?Active Straight Leg Raise with Quad Set  10 reps - 3 hold ?Clamshell with Resistance  10 reps - 3 hold ?Sidelying Hip Abduction  10 reps - 3 hold ?Seated Long Arc Quad  10 reps - 3 hold ?  ?  ?PATIENT EDUCATION:  ?Education details: Eval findings, POC, HEP, symptom management ?Person educated: Patient ?Education method: Explanation, Demonstration, Tactile cues, Verbal cues, and Handouts ?Education comprehension: verbalized understanding, returned demonstration, verbal cues required, and tactile cues required ?  ?  ?HOME EXERCISE PROGRAM: ?Access Code: [email or web address] ?URL: [email or web address] ?Date: 01/25/2022 ?Prepared by: Joellyn RuedAllen Ralls ? ?Exercises ?- Hip Flexion  - 1 x daily - 7 x weekly - 1 sets - 10 reps - 10 hold ?- Active Straight Leg Raise with Quad Set  - 1 x daily - 7 x weekly - 2 sets - 10 reps - 3 hold ?- Clamshell with Resistance  - 1 x daily - 7 x weekly - 2 sets - 10 reps - 3 hold ?- Sidelying Hip Abduction  - 1 x daily - 7 x weekly - 2 sets - 10 reps - 3 hold ?- Seated Long Arc Quad  - 1 x daily - 7 x weekly - 2 sets - 10 reps - 3 hold ?- Supine Bridge  - 1 x daily - 7 x weekly - 2 sets - 10 reps - 3 hold ?- Sit to Stand Without Arm Support  - 1 x daily - 7 x weekly - 2 sets - 10 reps - 3 hold ? ?ASSESSMENT: ?  ?CLINICAL IMPRESSION: ?Patient presents to PT wit moderate levels of pain in bilateral  hips and her R knee as well as upper thoracic spine. She is no longer using the crutch when ambulating and she has returned to work which has exacerbated her pain some. Session today focused on LE strengthe

## 2022-03-02 ENCOUNTER — Ambulatory Visit: Payer: 59 | Admitting: Physical Therapy

## 2022-03-02 ENCOUNTER — Encounter: Payer: Self-pay | Admitting: Physical Therapy

## 2022-03-02 DIAGNOSIS — M6281 Muscle weakness (generalized): Secondary | ICD-10-CM | POA: Diagnosis not present

## 2022-03-02 DIAGNOSIS — R262 Difficulty in walking, not elsewhere classified: Secondary | ICD-10-CM

## 2022-03-02 NOTE — Therapy (Signed)
OUTPATIENT PHYSICAL THERAPY TREATMENT NOTE   [name] MRN: [id number] DOB:Sep 12, 1991, 31 y.o., female Today's Date: 03/02/2022  PCP: Patient, No Pcp Per (Inactive) REFERRING PROVIDER: Corinne Ports, PA-C  END OF SESSION:   PT End of Session - 03/02/22 1619     Visit Number 8    Number of Visits 17    Date for PT Re-Evaluation 03/18/22    Authorization Type [employer] OTHER;[place] MEDICAID [employer]    Progress Note Due on Visit 10    PT Start Time 0415    PT Stop Time 0458    PT Time Calculation (min) 43 min    Activity Tolerance Patient tolerated treatment well    Behavior During Therapy [facility] for tasks assessed/performed               Past Medical History:  Diagnosis Date   Anemia    Anxiety    Chlamydia 10/2011   Cholecystitis    Closed head injury with concussion    Gestational hypertension    Headache(784.0)    Heart palpitations    Hypertension    Past Surgical History:  Procedure Laterality Date   ORIF ANKLE FRACTURE Left 02/12/2016   Procedure: OPEN REDUCTION INTERNAL FIXATION (ORIF) ANKLE FRACTURE;  Surgeon: Altamese Mart, MD;  Location: [place];  Service: Orthopedics;  Laterality: Left;   ORIF TIBIA PLATEAU Right 11/26/2021   Procedure: OPEN REDUCTION INTERNAL FIXATION (ORIF) TIBIAL PLATEAU;  Surgeon: [name] Needles, MD;  Location: [place];  Service: Orthopedics;  Laterality: Right;   Patient Active Problem List   Diagnosis Date Noted   Right medial tibial plateau fracture 11/24/2021   Fetal pericardial effusion affecting management of mother 08/11/2017   Pre-eclampsia 07/31/2017   Group B streptococcus urinary tract infection affecting pregnancy 04/24/2017   Encounter for supervision of normal pregnancy, unspecified, unspecified trimester 04/10/2017   History of gestational hypertension 04/10/2017   Short interval between pregnancies affecting pregnancy, antepartum 04/10/2017   Murmur, cardiac 02/10/2016     REFERRING DIAG: Right medial tibial plateau fracture  THERAPY DIAG:  Muscle weakness (generalized)  Difficulty in walking, not elsewhere classified   SUBJECTIVE:    SUBJECTIVE STATEMENT: Pt reports she is sore after work.  She reports continued stiffness in her R knee.  PAIN:  Are you having pain? Yes: NPRS scale: 7/10 Pain location: Anterior knee pain Pain description: ache, throb Aggravating factors: Prolonged standing and walking 7/10 Relieving factors: Medications, moving it, rest  PERTINENT HISTORY:  Obesity, L ankle fx    PRECAUTIONS: None   WEIGHT BEARING RESTRICTIONS  WBAT   FALLS:  Has patient fallen in last 6 months? Yes. Number of falls 1   LIVING ENVIRONMENT: Lives with: lives with their family Lives in: Mobile home No issues with accessing home or mobility within home   OCCUPATION: [employer]   PLOF: Independent   PATIENT GOALS To be back to normal as much as posible, to walk more normally     OBJECTIVE:    DIAGNOSTIC FINDINGS: not available   PATIENT SURVEYS:  FOTO 36%  Functional status   COGNITION:           Overall cognitive status: Within functional limits for tasks assessed                          SENSATION: Light touch: NT pt wearing yoga pants   Swelling:  R knee appears swollen, difficult to assess with pt wearing yoga pants   POSTURE:  WNL   PALPATION: TTP of the anterior knee   LE ROM:   Active ROM Right 01/14/2022 Left 01/14/2022 Right  02/03/22  Hip flexion       Hip extension       Hip abduction       Hip adduction       Hip internal rotation       Hip external rotation       Knee flexion 120 130 135  Knee extension 0 0   Ankle dorsiflexion       Ankle plantarflexion       Ankle inversion       Ankle eversion        (Blank rows = not tested) Ext lag R -15d, L -5d   LE MMT:   MMT Right 01/14/2022 Left 01/14/2022  Hip flexion 4 4+  Hip extension 4 5  Hip abduction 4+ 5  Hip adduction  5 5  Hip internal rotation 5 5  Hip external rotation 4 5  Knee flexion 4+ 5  Knee extension 4+ 5  Ankle dorsiflexion 5 5  Ankle plantarflexion 5 5  Ankle inversion      Ankle eversion       (Blank rows = not tested)   FUNCTIONAL TESTS:  5 times sit to stand: 31.7 s use of hands, decreased wt bearing R LE 02/16/2022: 17.52 with no UE use  2 minute walk test: 235 ft     GAIT: Distance walked: 235 ft within clinic Assistive device utilized: Crutches pt left crutches in car Level of assistance: Complete Independence Comments: Pt left crutches in car. Pt walked with an antlgic gait over the R LE. Pt kept R knee straight       TODAY'S TREATMENT:  [facility] Adult PT Treatment:                                                DATE: 03/02/2022 Therapeutic Exercise: Nustep level 5 x 5 mins Knee flexion stretch on step - 20x Knee ext machine - 5# - 3x10 - R Knee flexion machine - 10# - 3x10 - R R CR hip flexor stretch on side of table Rt SLR 3x10 Rt single leg bridge 2x10  [facility] Adult PT Treatment:                                                DATE: 02/16/2022 Therapeutic Exercise: Nustep level 5 x 5 mins SKTC 2x30" each R hip flexor stretch with strap 3x30" Supine hamstring stretch with strap 2x30" BIL Open books x10 BIL Rt SLR 2x15 Rt single leg bridge 2x10 Bridges 2x10 Sidelying hip abduction Rt 2x15 Rows GTB 2x10 Shoulder extension GTB 2x10 5x STS 17.52 seconds Therapeutic Activity: Gait training heel toe gait pattern without crutch, encouraging equal stance time on R and L x 1 lap in clinic   [facility] Adult PT Treatment:  DATE: 02/08/22 Therapeutic Exercise: Nu step L6  5 min- LE only  SKTC 2 x 30 sec each Right hip flexor stretch with strap 3 x 30 sec  Bilateral  hamstring stretch with strap  Open book stretch bilat  Figure 4 push and pull bilateral  Right single leg bridge 10 x 2  Right SLR x 15 x 2 Right hip abduction 15 x  2   Therapeutic Activity: Gait training heel toe gait pattern  without crutch. Pt weight shifts over the R LE appearing due to hip abd weakness, AD used today to decrease antalgic pattern.   [place] [facility] Adult PT Treatment:                                                DATE: 02/03/22 Therapeutic Exercise: Nu step L6  5 min- LE only  Gait training- see below SLS trials at counter- postural sway and 5 sec best  R SLS with left hip hikes  Knee Extension OMEGA 5# eccentric lowering  Knee Flexion OMEGA 15# eccentric returns STS with blue band x 15 Right hip abduction 10 x 3 Right SLR x 15 x 2 Right hip flexor stretch 2 x 30 sec  Right single leg bridge 10 x 2   Therapeutic Activity: Gait training heel toe gait pattern  without crutch. Pt weight shifts over the R LE appearing due to hip abd weakness    Eval Treatment: Hip Flexion 10 reps - 10 hold Active Straight Leg Raise with Quad Set  10 reps - 3 hold Clamshell with Resistance  10 reps - 3 hold Sidelying Hip Abduction  10 reps - 3 hold Seated Long Arc Quad  10 reps - 3 hold     PATIENT EDUCATION:  Education details: Eval findings, POC, HEP, symptom management Person educated: Patient Education method: Explanation, Demonstration, Tactile cues, Verbal cues, and Handouts Education comprehension: verbalized understanding, returned demonstration, verbal cues required, and tactile cues required     HOME EXERCISE PROGRAM: Access Code: [email or web address] URL: [email or web address] Date: 03/02/2022 Prepared by: Shearon Balo  Exercises - Active Straight Leg Raise with Quad Set  - 1 x daily - 7 x weekly - 2 sets - 10 reps - 3 hold - Sidelying Hip Abduction  - 1 x daily - 7 x weekly - 2 sets - 10 reps - 3 hold - Sit to Stand Without Arm Support  - 1 x daily - 7 x weekly - 2 sets - 10 reps - 3 hold - Seated Knee Extension with Resistance  - 2 x daily - 7 x weekly - 3 sets - 10 reps  ASSESSMENT:   CLINICAL IMPRESSION: Pt with  significant strength deficits in R HS and Quads which should be focus of rehab.  ROM looks good.  Advised on scar massage.  Issued band for resisted knee ext at home.    OBJECTIVE IMPAIRMENTS Abnormal gait, decreased activity tolerance, difficulty walking, decreased ROM, decreased strength, increased edema, obesity, and pain.     REHAB POTENTIAL: Good    GOALS:   SHORT TERM GOALS: Target date: 02/04/2022   Pt will be Ind in an initial HEP Baseline: started on eval Goal status: MET 02/03/22   2.  Gait will progress to pt walking c 1 crutch or SPC the majority of the time Baseline: crutches Status: no AD, min limp Goal status:  MET 02/03/22   LONG TERM GOALS: Target date: 03/18/22   Increase R hip strength to at least 4+/5 and R knee strength to 5/5 for improved function of the R LE Baseline: see flow sheet Goal status: INITIAL   2.  Increase R knee flexion ROM to 125d for improved R knee function  Baseline: see flow sheet Goal status: MET 02/03/22   3.  R knee ext lag c SAQ will increase to -5 for improved R LE function Baseline: -15 Goal status: INITIAL   4.  2MWT will increase to 450 ft as indication of improved function Baseline: 233 ft Goal status: INITIAL   5. Decrease 5XSTS to 20 sec or less as indication of improved function Baseline: 31.7 sec Goal status: MET 02/16/2022 with 17.52 seconds and no use of UE   6.  Pt will complete commmunity ambulation s an AD tolerating distances of 1060f with a mild to no limp over the R LE. Baseline:  Goal status: INITIAL     PLAN: PT FREQUENCY: 2x/week   PT DURATION: 8 weeks   PLANNED INTERVENTIONS: Therapeutic exercises, Therapeutic activity, Neuromuscular re-education, Balance training, Gait training, Patient/Family education, Joint mobilization, Stair training, Dry Needling, Electrical stimulation, Cryotherapy, Moist heat, scar mobilization, Taping, Vasopneumatic device, Ultrasound, Ionotophoresis 427mml Dexamethasone, and Manual  therapy   PLAN FOR NEXT SESSION: assess response to HEP,  progress therex and complete gait training   KaShearon BaloT, DPT 03/02/22 4:20 PM

## 2022-03-03 ENCOUNTER — Ambulatory Visit: Payer: 59 | Attending: Student | Admitting: Physical Therapy

## 2022-03-03 ENCOUNTER — Encounter: Payer: Self-pay | Admitting: Physical Therapy

## 2022-03-03 DIAGNOSIS — R262 Difficulty in walking, not elsewhere classified: Secondary | ICD-10-CM | POA: Diagnosis present

## 2022-03-03 DIAGNOSIS — M6281 Muscle weakness (generalized): Secondary | ICD-10-CM | POA: Diagnosis present

## 2022-03-03 NOTE — Therapy (Signed)
OUTPATIENT PHYSICAL THERAPY TREATMENT NOTE   [name] MRN: [id number] DOB:March 26, 1991, 31 y.o., female Today's Date: 03/03/2022  PCP: Patient, No Pcp Per (Inactive) REFERRING PROVIDER: Corinne Ports, PA-C  END OF SESSION:   PT End of Session - 03/03/22 1615     Visit Number 9    Number of Visits 17    Date for PT Re-Evaluation 03/18/22    Authorization Type [employer] OTHER;[place] MEDICAID [employer]    Progress Note Due on Visit 10    PT Start Time 0415    PT Stop Time 0455    PT Time Calculation (min) 40 min    Activity Tolerance Patient tolerated treatment well    Behavior During Therapy [facility] for tasks assessed/performed               Past Medical History:  Diagnosis Date   Anemia    Anxiety    Chlamydia 10/2011   Cholecystitis    Closed head injury with concussion    Gestational hypertension    Headache(784.0)    Heart palpitations    Hypertension    Past Surgical History:  Procedure Laterality Date   ORIF ANKLE FRACTURE Left 02/12/2016   Procedure: OPEN REDUCTION INTERNAL FIXATION (ORIF) ANKLE FRACTURE;  Surgeon: Altamese Furnas, MD;  Location: [place];  Service: Orthopedics;  Laterality: Left;   ORIF TIBIA PLATEAU Right 11/26/2021   Procedure: OPEN REDUCTION INTERNAL FIXATION (ORIF) TIBIAL PLATEAU;  Surgeon: [name] Needles, MD;  Location: [place];  Service: Orthopedics;  Laterality: Right;   Patient Active Problem List   Diagnosis Date Noted   Right medial tibial plateau fracture 11/24/2021   Fetal pericardial effusion affecting management of mother 08/11/2017   Pre-eclampsia 07/31/2017   Group B streptococcus urinary tract infection affecting pregnancy 04/24/2017   Encounter for supervision of normal pregnancy, unspecified, unspecified trimester 04/10/2017   History of gestational hypertension 04/10/2017   Short interval between pregnancies affecting pregnancy, antepartum 04/10/2017   Murmur, cardiac 02/10/2016     REFERRING DIAG: Right medial tibial plateau fracture  THERAPY DIAG:  Muscle weakness (generalized)  Difficulty in walking, not elsewhere classified   SUBJECTIVE:    SUBJECTIVE STATEMENT: Pt reports that her knee is feeling good today, but her hip and back are a little more sore today.  PAIN:  Are you having pain? Yes: NPRS scale: 5/10 Pain location: Anterior knee pain Pain description: ache, throb Aggravating factors: Prolonged standing and walking 7/10 Relieving factors: Medications, moving it, rest  PERTINENT HISTORY:  Obesity, L ankle fx    PRECAUTIONS: None   WEIGHT BEARING RESTRICTIONS  WBAT   FALLS:  Has patient fallen in last 6 months? Yes. Number of falls 1   LIVING ENVIRONMENT: Lives with: lives with their family Lives in: Mobile home No issues with accessing home or mobility within home   OCCUPATION: [employer]   PLOF: Independent   PATIENT GOALS To be back to normal as much as posible, to walk more normally     OBJECTIVE:    DIAGNOSTIC FINDINGS: not available   PATIENT SURVEYS:  FOTO 36%  Functional status   COGNITION:           Overall cognitive status: Within functional limits for tasks assessed                          SENSATION: Light touch: NT pt wearing yoga pants   Swelling:  R knee appears swollen, difficult to assess with pt wearing yoga pants   POSTURE:  WNL   PALPATION: TTP of the anterior knee   LE ROM:   Active ROM Right 01/14/2022 Left 01/14/2022 Right  02/03/22  Hip flexion       Hip extension       Hip abduction       Hip adduction       Hip internal rotation       Hip external rotation       Knee flexion 120 130 135  Knee extension 0 0   Ankle dorsiflexion       Ankle plantarflexion       Ankle inversion       Ankle eversion        (Blank rows = not tested) Ext lag R -15d, L -5d   LE MMT:   MMT Right 01/14/2022 Left 01/14/2022  Hip flexion 4 4+  Hip extension 4 5  Hip abduction 4+ 5   Hip adduction 5 5  Hip internal rotation 5 5  Hip external rotation 4 5  Knee flexion 4+ 5  Knee extension 4+ 5  Ankle dorsiflexion 5 5  Ankle plantarflexion 5 5  Ankle inversion      Ankle eversion       (Blank rows = not tested)   FUNCTIONAL TESTS:  5 times sit to stand: 31.7 s use of hands, decreased wt bearing R LE 02/16/2022: 17.52 with no UE use  2 minute walk test: 235 ft     GAIT: Distance walked: 235 ft within clinic Assistive device utilized: Crutches pt left crutches in car Level of assistance: Complete Independence Comments: Pt left crutches in car. Pt walked with an antlgic gait over the R LE. Pt kept R knee straight       TODAY'S TREATMENT:  [facility] Adult PT Treatment:                                                DATE: 03/03/2022 Therapeutic Exercise: Nustep level 5 x 5 mins Knee ext machine - 5# - 3x10 - R Knee flexion machine - 10# - 3x10 - R S/L leg press - 35# - 3x10 R CR hip flexor stretch on side of table Rt SLR 3x10 2# Rt single leg bridge 3x10  [facility] Adult PT Treatment:                                                DATE: 03/02/2022 Therapeutic Exercise: Nustep level 5 x 5 mins Knee flexion stretch on step - 20x Knee ext machine - 5# - 3x10 - R Knee flexion machine - 10# - 3x10 - R R CR hip flexor stretch on side of table Rt SLR 3x10 Rt single leg bridge 2x10  [facility] Adult PT Treatment:                                                DATE: 02/16/2022 Therapeutic Exercise: Nustep level 5 x 5 mins SKTC 2x30" each R hip flexor stretch with strap 3x30"  Supine hamstring stretch with strap 2x30" BIL Open books x10 BIL Rt SLR 2x15 Rt single leg bridge 2x10 Bridges 2x10 Sidelying hip abduction Rt 2x15 Rows GTB 2x10 Shoulder extension GTB 2x10 5x STS 17.52 seconds Therapeutic Activity: Gait training heel toe gait pattern without crutch, encouraging equal stance time on R and L x 1 lap in clinic   [facility] Adult PT Treatment:                                                 DATE: 02/08/22 Therapeutic Exercise: Nu step L6  5 min- LE only  SKTC 2 x 30 sec each Right hip flexor stretch with strap 3 x 30 sec  Bilateral  hamstring stretch with strap  Open book stretch bilat  Figure 4 push and pull bilateral  Right single leg bridge 10 x 2  Right SLR x 15 x 2 Right hip abduction 15 x 2   Therapeutic Activity: Gait training heel toe gait pattern  without crutch. Pt weight shifts over the R LE appearing due to hip abd weakness, AD used today to decrease antalgic pattern.   [facility] Adult PT Treatment:                                                DATE: 02/03/22 Therapeutic Exercise: Nu step L6  5 min- LE only  Gait training- see below SLS trials at counter- postural sway and 5 sec best  R SLS with left hip hikes  Knee Extension OMEGA 5# eccentric lowering  Knee Flexion OMEGA 15# eccentric returns STS with blue band x 15 Right hip abduction 10 x 3 Right SLR x 15 x 2 Right hip flexor stretch 2 x 30 sec  Right single leg bridge 10 x 2   Therapeutic Activity: Gait training heel toe gait pattern  without crutch. Pt weight shifts over the R LE appearing due to hip abd weakness    Eval Treatment: Hip Flexion 10 reps - 10 hold Active Straight Leg Raise with Quad Set  10 reps - 3 hold Clamshell with Resistance  10 reps - 3 hold Sidelying Hip Abduction  10 reps - 3 hold Seated Long Arc Quad  10 reps - 3 hold     PATIENT EDUCATION:  Education details: Eval findings, POC, HEP, symptom management Person educated: Patient Education method: Explanation, Demonstration, Tactile cues, Verbal cues, and Handouts Education comprehension: verbalized understanding, returned demonstration, verbal cues required, and tactile cues required     HOME EXERCISE PROGRAM: Access Code: [email or web address] URL: [email or web address] Date: 03/02/2022 Prepared by: Shearon Balo  Exercises - Active Straight Leg Raise with Quad Set  - 1 x daily - 7 x  weekly - 2 sets - 10 reps - 3 hold - Sidelying Hip Abduction  - 1 x daily - 7 x weekly - 2 sets - 10 reps - 3 hold - Sit to Stand Without Arm Support  - 1 x daily - 7 x weekly - 2 sets - 10 reps - 3 hold - Seated Knee Extension with Resistance  - 2 x daily - 7 x weekly - 3 sets - 10 reps  ASSESSMENT:   CLINICAL IMPRESSION: Loreda did well today.  She continues to  show significant quad and HS weakness which needs to be addressed.  Adding in balance and gait training concurrently would also be beneficial.    OBJECTIVE IMPAIRMENTS Abnormal gait, decreased activity tolerance, difficulty walking, decreased ROM, decreased strength, increased edema, obesity, and pain.     REHAB POTENTIAL: Good    GOALS:   SHORT TERM GOALS: Target date: 02/04/2022   Pt will be Ind in an initial HEP Baseline: started on eval Goal status: MET 02/03/22   2.  Gait will progress to pt walking c 1 crutch or SPC the majority of the time Baseline: crutches Status: no AD, min limp Goal status: MET 02/03/22   LONG TERM GOALS: Target date: 03/18/22   Increase R hip strength to at least 4+/5 and R knee strength to 5/5 for improved function of the R LE Baseline: see flow sheet Goal status: INITIAL   2.  Increase R knee flexion ROM to 125d for improved R knee function  Baseline: see flow sheet Goal status: MET 02/03/22   3.  R knee ext lag c SAQ will increase to -5 for improved R LE function Baseline: -15 Goal status: INITIAL   4.  2MWT will increase to 450 ft as indication of improved function Baseline: 233 ft Goal status: INITIAL   5. Decrease 5XSTS to 20 sec or less as indication of improved function Baseline: 31.7 sec Goal status: MET 02/16/2022 with 17.52 seconds and no use of UE   6.  Pt will complete commmunity ambulation s an AD tolerating distances of 1043f with a mild to no limp over the R LE. Baseline:  Goal status: INITIAL     PLAN: PT FREQUENCY: 2x/week   PT DURATION: 8 weeks   PLANNED  INTERVENTIONS: Therapeutic exercises, Therapeutic activity, Neuromuscular re-education, Balance training, Gait training, Patient/Family education, Joint mobilization, Stair training, Dry Needling, Electrical stimulation, Cryotherapy, Moist heat, scar mobilization, Taping, Vasopneumatic device, Ultrasound, Ionotophoresis 429mml Dexamethasone, and Manual therapy   PLAN FOR NEXT SESSION: assess response to HEP,  progress therex and complete gait training   KaShearon BaloT, DPT 03/03/22 4:56 PM

## 2022-03-07 ENCOUNTER — Ambulatory Visit: Payer: 59

## 2022-03-07 NOTE — Therapy (Incomplete)
OUTPATIENT PHYSICAL THERAPY TREATMENT NOTE   [name] MRN: [id number] DOB:June 11, 1991, 31 y.o., female Today's Date: 03/07/2022  PCP: Patient, No Pcp Per (Inactive) REFERRING PROVIDER: Corinne Ports, PA-C  END OF SESSION:       Past Medical History:  Diagnosis Date   Anemia    Anxiety    Chlamydia 10/2011   Cholecystitis    Closed head injury with concussion    Gestational hypertension    Headache(784.0)    Heart palpitations    Hypertension    Past Surgical History:  Procedure Laterality Date   ORIF ANKLE FRACTURE Left 02/12/2016   Procedure: OPEN REDUCTION INTERNAL FIXATION (ORIF) ANKLE FRACTURE;  Surgeon: Altamese [place], MD;  Location: [place];  Service: Orthopedics;  Laterality: Left;   ORIF TIBIA PLATEAU Right 11/26/2021   Procedure: OPEN REDUCTION INTERNAL FIXATION (ORIF) TIBIAL PLATEAU;  Surgeon: [name] Needles, MD;  Location: [place];  Service: Orthopedics;  Laterality: Right;   Patient Active Problem List   Diagnosis Date Noted   Right medial tibial plateau fracture 11/24/2021   Fetal pericardial effusion affecting management of mother 08/11/2017   Pre-eclampsia 07/31/2017   Group B streptococcus urinary tract infection affecting pregnancy 04/24/2017   Encounter for supervision of normal pregnancy, unspecified, unspecified trimester 04/10/2017   History of gestational hypertension 04/10/2017   Short interval between pregnancies affecting pregnancy, antepartum 04/10/2017   Murmur, cardiac 02/10/2016    REFERRING DIAG: Right medial tibial plateau fracture  THERAPY DIAG:  No diagnosis found.   SUBJECTIVE:    SUBJECTIVE STATEMENT: ***  PAIN:  Are you having pain? Yes: NPRS scale: 5/10 Pain location: Anterior knee pain Pain description: ache, throb Aggravating factors: Prolonged standing and walking 7/10 Relieving factors: Medications, moving it, rest  PERTINENT HISTORY:  Obesity, L ankle fx    PRECAUTIONS: None   WEIGHT BEARING  RESTRICTIONS  WBAT    OBJECTIVE:    DIAGNOSTIC FINDINGS: not available   PATIENT SURVEYS:  FOTO 36%  Functional status   COGNITION:           Overall cognitive status: Within functional limits for tasks assessed                          SENSATION: Light touch: NT pt wearing yoga pants   Swelling:            R knee appears swollen, difficult to assess with pt wearing yoga pants   POSTURE:  WNL   PALPATION: TTP of the anterior knee   LE ROM:   Active ROM Right 01/14/2022 Left 01/14/2022 Right  02/03/22  Hip flexion       Hip extension       Hip abduction       Hip adduction       Hip internal rotation       Hip external rotation       Knee flexion 120 130 135  Knee extension 0 0   Ankle dorsiflexion       Ankle plantarflexion       Ankle inversion       Ankle eversion        (Blank rows = not tested) Ext lag R -15d, L -5d   LE MMT:   MMT Right 01/14/2022 Left 01/14/2022  Hip flexion 4 4+  Hip extension 4 5  Hip abduction 4+ 5  Hip adduction 5 5  Hip internal rotation 5 5  Hip external  rotation 4 5  Knee flexion 4+ 5  Knee extension 4+ 5  Ankle dorsiflexion 5 5  Ankle plantarflexion 5 5  Ankle inversion      Ankle eversion       (Blank rows = not tested)   FUNCTIONAL TESTS:  5 times sit to stand: 31.7 s use of hands, decreased wt bearing R LE 02/16/2022: 17.52 with no UE use  2 minute walk test: 235 ft     GAIT: Distance walked: 235 ft within clinic Assistive device utilized: Crutches pt left crutches in car Level of assistance: Complete Independence Comments: Pt left crutches in car. Pt walked with an antlgic gait over the R LE. Pt kept R knee straight       TODAY'S TREATMENT: [facility] Adult PT Treatment:                                                DATE: 03/07/2022 Therapeutic Exercise: Nustep level 5 x 5 mins Knee ext machine - 5# - 3x10 - R Knee flexion machine - 10# - 3x10 - R S/L leg press - 35# - 3x10 R CR hip flexor stretch on side of  table Rt SLR 3x10 2# Rt single leg bridge 3x10  [facility] Adult PT Treatment:                                                DATE: 03/03/2022 Therapeutic Exercise: Nustep level 5 x 5 mins Knee ext machine - 5# - 3x10 - R Knee flexion machine - 10# - 3x10 - R S/L leg press - 35# - 3x10 R CR hip flexor stretch on side of table Rt SLR 3x10 2# Rt single leg bridge 3x10  [facility] Adult PT Treatment:                                                DATE: 03/02/2022 Therapeutic Exercise: Nustep level 5 x 5 mins Knee flexion stretch on step - 20x Knee ext machine - 5# - 3x10 - R Knee flexion machine - 10# - 3x10 - R R CR hip flexor stretch on side of table Rt SLR 3x10 Rt single leg bridge 2x10  Eval Treatment: Hip Flexion 10 reps - 10 hold Active Straight Leg Raise with Quad Set  10 reps - 3 hold Clamshell with Resistance  10 reps - 3 hold Sidelying Hip Abduction  10 reps - 3 hold Seated Long Arc Quad  10 reps - 3 hold     PATIENT EDUCATION:  Education details: Eval findings, POC, HEP, symptom management Person educated: Patient Education method: Explanation, Demonstration, Tactile cues, Verbal cues, and Handouts Education comprehension: verbalized understanding, returned demonstration, verbal cues required, and tactile cues required     HOME EXERCISE PROGRAM: Access Code: [email or web address] URL: [email or web address] Date: 03/02/2022 Prepared by: Shearon Balo  Exercises - Active Straight Leg Raise with Quad Set  - 1 x daily - 7 x weekly - 2 sets - 10 reps - 3 hold - Sidelying Hip Abduction  - 1 x daily - 7 x weekly - 2 sets -  10 reps - 3 hold - Sit to Stand Without Arm Support  - 1 x daily - 7 x weekly - 2 sets - 10 reps - 3 hold - Seated Knee Extension with Resistance  - 2 x daily - 7 x weekly - 3 sets - 10 reps  ASSESSMENT:   CLINICAL IMPRESSION: ***    OBJECTIVE IMPAIRMENTS Abnormal gait, decreased activity tolerance, difficulty walking, decreased ROM, decreased  strength, increased edema, obesity, and pain.     REHAB POTENTIAL: Good    GOALS:   SHORT TERM GOALS: Target date: 02/04/2022   Pt will be Ind in an initial HEP Baseline: started on eval Goal status: MET 02/03/22   2.  Gait will progress to pt walking c 1 crutch or SPC the majority of the time Baseline: crutches Status: no AD, min limp Goal status: MET 02/03/22   LONG TERM GOALS: Target date: 03/18/22   Increase R hip strength to at least 4+/5 and R knee strength to 5/5 for improved function of the R LE Baseline: see flow sheet Goal status: INITIAL   2.  Increase R knee flexion ROM to 125d for improved R knee function  Baseline: see flow sheet Goal status: MET 02/03/22   3.  R knee ext lag c SAQ will increase to -5 for improved R LE function Baseline: -15 Goal status: INITIAL   4.  2MWT will increase to 450 ft as indication of improved function Baseline: 233 ft Goal status: INITIAL   5. Decrease 5XSTS to 20 sec or less as indication of improved function Baseline: 31.7 sec Goal status: MET 02/16/2022 with 17.52 seconds and no use of UE   6.  Pt will complete commmunity ambulation s an AD tolerating distances of 1012f with a mild to no limp over the R LE. Baseline:  Goal status: INITIAL     PLAN: PT FREQUENCY: 2x/week   PT DURATION: 8 weeks   PLANNED INTERVENTIONS: Therapeutic exercises, Therapeutic activity, Neuromuscular re-education, Balance training, Gait training, Patient/Family education, Joint mobilization, Stair training, Dry Needling, Electrical stimulation, Cryotherapy, Moist heat, scar mobilization, Taping, Vasopneumatic device, Ultrasound, Ionotophoresis 430mml Dexamethasone, and Manual therapy   PLAN FOR NEXT SESSION: assess response to HEP,  progress therex and complete gait training   DaWard ChattersT  03/07/22 8:04 AM

## 2022-03-09 ENCOUNTER — Ambulatory Visit: Payer: 59

## 2022-03-09 DIAGNOSIS — M6281 Muscle weakness (generalized): Secondary | ICD-10-CM | POA: Diagnosis not present

## 2022-03-09 DIAGNOSIS — R262 Difficulty in walking, not elsewhere classified: Secondary | ICD-10-CM

## 2022-03-09 NOTE — Therapy (Signed)
OUTPATIENT PHYSICAL THERAPY TREATMENT NOTE   [name] MRN: [id number] DOB:06/02/1991, 31 y.o., female Today's Date: 03/09/2022  PCP: Patient, No Pcp Per (Inactive) REFERRING PROVIDER: Corinne Ports, PA-C  END OF SESSION:   PT End of Session - 03/09/22 1658     Visit Number 10    Number of Visits 17    Date for PT Re-Evaluation 03/18/22    Authorization Type [employer] OTHER;[place] MEDICAID [employer]    Progress Note Due on Visit 10    PT Start Time 1658    PT Stop Time 1739    PT Time Calculation (min) 41 min    Activity Tolerance Patient tolerated treatment well    Behavior During Therapy [facility] for tasks assessed/performed                Past Medical History:  Diagnosis Date   Anemia    Anxiety    Chlamydia 10/2011   Cholecystitis    Closed head injury with concussion    Gestational hypertension    Headache(784.0)    Heart palpitations    Hypertension    Past Surgical History:  Procedure Laterality Date   ORIF ANKLE FRACTURE Left 02/12/2016   Procedure: OPEN REDUCTION INTERNAL FIXATION (ORIF) ANKLE FRACTURE;  Surgeon: Altamese [place], MD;  Location: [place];  Service: Orthopedics;  Laterality: Left;   ORIF TIBIA PLATEAU Right 11/26/2021   Procedure: OPEN REDUCTION INTERNAL FIXATION (ORIF) TIBIAL PLATEAU;  Surgeon: [name] Needles, MD;  Location: [place];  Service: Orthopedics;  Laterality: Right;   Patient Active Problem List   Diagnosis Date Noted   Right medial tibial plateau fracture 11/24/2021   Fetal pericardial effusion affecting management of mother 08/11/2017   Pre-eclampsia 07/31/2017   Group B streptococcus urinary tract infection affecting pregnancy 04/24/2017   Encounter for supervision of normal pregnancy, unspecified, unspecified trimester 04/10/2017   History of gestational hypertension 04/10/2017   Short interval between pregnancies affecting pregnancy, antepartum 04/10/2017   Murmur, cardiac 02/10/2016     REFERRING DIAG: Right medial tibial plateau fracture  THERAPY DIAG:  Muscle weakness (generalized)  Difficulty in walking, not elsewhere classified   SUBJECTIVE:    SUBJECTIVE STATEMENT: Patient reports that her hip is feeling ok today, but that her lower back and knee are hurting today. She describes more stiffness than pain.  PAIN:  Are you having pain? Yes: NPRS scale: 4/10 knee, 7/10 lower back Pain location: lower back, knee Pain description: ache, throb Aggravating factors: Prolonged standing and walking 7/10 Relieving factors: Medications, moving it, rest  PERTINENT HISTORY:  Obesity, L ankle fx    PRECAUTIONS: None   WEIGHT BEARING RESTRICTIONS  WBAT   FALLS:  Has patient fallen in last 6 months? Yes. Number of falls 1   LIVING ENVIRONMENT: Lives with: lives with their family Lives in: Mobile home No issues with accessing home or mobility within home   OCCUPATION: [employer]   PLOF: Independent   PATIENT GOALS To be back to normal as much as posible, to walk more normally     OBJECTIVE:    DIAGNOSTIC FINDINGS: not available   PATIENT SURVEYS:  FOTO 36%  Functional status   COGNITION:           Overall cognitive status: Within functional limits for tasks assessed                          SENSATION: Light touch: NT pt  wearing yoga pants   Swelling:            R knee appears swollen, difficult to assess with pt wearing yoga pants   POSTURE:  WNL   PALPATION: TTP of the anterior knee   LE ROM:   Active ROM Right 01/14/2022 Left 01/14/2022 Right  02/03/22  Hip flexion       Hip extension       Hip abduction       Hip adduction       Hip internal rotation       Hip external rotation       Knee flexion 120 130 135  Knee extension 0 0   Ankle dorsiflexion       Ankle plantarflexion       Ankle inversion       Ankle eversion        (Blank rows = not tested) Ext lag R -15d, L -5d   LE MMT:   MMT Right 01/14/2022  Left 01/14/2022  Hip flexion 4 4+  Hip extension 4 5  Hip abduction 4+ 5  Hip adduction 5 5  Hip internal rotation 5 5  Hip external rotation 4 5  Knee flexion 4+ 5  Knee extension 4+ 5  Ankle dorsiflexion 5 5  Ankle plantarflexion 5 5  Ankle inversion      Ankle eversion       (Blank rows = not tested)   FUNCTIONAL TESTS:  5 times sit to stand: 31.7 s use of hands, decreased wt bearing R LE 02/16/2022: 17.52 with no UE use  2 minute walk test: 235 ft     GAIT: Distance walked: 235 ft within clinic Assistive device utilized: Crutches pt left crutches in car Level of assistance: Complete Independence Comments: Pt left crutches in car. Pt walked with an antlgic gait over the R LE. Pt kept R knee straight       TODAY'S TREATMENT: [place] Adult PT Treatment:                                                DATE: 03/09/2022 Therapeutic Exercise: Nustep level 5 x 5 mins Knee ext machine - 5# - 3x10 - R Knee flexion machine - 10# - 3x10 - R S/L leg press - 35# - 3x10 - R Modified thomas stretch EOM x1' R Rt SLR 3x10 2# Rt single leg bridge 3x10 Neuromuscular re-ed: Tandem stance x45" BIL Tandem stance on foam x45" BIL Romberg stance on foam with head turns x45" Romberg stance on foam EC x30"   [facility] Adult PT Treatment:                                                DATE: 03/03/2022 Therapeutic Exercise: Nustep level 5 x 5 mins Knee ext machine - 5# - 3x10 - R Knee flexion machine - 10# - 3x10 - R S/L leg press - 35# - 3x10 R CR hip flexor stretch on side of table Rt SLR 3x10 2# Rt single leg bridge 3x10  [facility] Adult PT Treatment:  DATE: 03/02/2022 Therapeutic Exercise: Nustep level 5 x 5 mins Knee flexion stretch on step - 20x Knee ext machine - 5# - 3x10 - R Knee flexion machine - 10# - 3x10 - R R CR hip flexor stretch on side of table Rt SLR 3x10 Rt single leg bridge 2x10  [facility] Adult PT Treatment:                                                 DATE: 02/16/2022 Therapeutic Exercise: Nustep level 5 x 5 mins SKTC 2x30" each R hip flexor stretch with strap 3x30" Supine hamstring stretch with strap 2x30" BIL Open books x10 BIL Rt SLR 2x15 Rt single leg bridge 2x10 Bridges 2x10 Sidelying hip abduction Rt 2x15 Rows GTB 2x10 Shoulder extension GTB 2x10 5x STS 17.52 seconds Therapeutic Activity: Gait training heel toe gait pattern without crutch, encouraging equal stance time on R and L x 1 lap in clinic   [facility] Adult PT Treatment:                                                DATE: 02/08/22 Therapeutic Exercise: Nu step L6  5 min- LE only  SKTC 2 x 30 sec each Right hip flexor stretch with strap 3 x 30 sec  Bilateral  hamstring stretch with strap  Open book stretch bilat  Figure 4 push and pull bilateral  Right single leg bridge 10 x 2  Right SLR x 15 x 2 Right hip abduction 15 x 2   Therapeutic Activity: Gait training heel toe gait pattern  without crutch. Pt weight shifts over the R LE appearing due to hip abd weakness, AD used today to decrease antalgic pattern.      PATIENT EDUCATION:  Education details: Eval findings, POC, HEP, symptom management Person educated: Patient Education method: Explanation, Demonstration, Tactile cues, Verbal cues, and Handouts Education comprehension: verbalized understanding, returned demonstration, verbal cues required, and tactile cues required     HOME EXERCISE PROGRAM: Access Code: [email or web address] URL: [email or web address] Date: 03/02/2022 Prepared by: Shearon Balo  Exercises - Active Straight Leg Raise with Quad Set  - 1 x daily - 7 x weekly - 2 sets - 10 reps - 3 hold - Sidelying Hip Abduction  - 1 x daily - 7 x weekly - 2 sets - 10 reps - 3 hold - Sit to Stand Without Arm Support  - 1 x daily - 7 x weekly - 2 sets - 10 reps - 3 hold - Seated Knee Extension with Resistance  - 2 x daily - 7 x weekly - 3 sets - 10 reps  ASSESSMENT:    CLINICAL IMPRESSION: Patient presents to PT with mild pain in her knee and moderate to high pain in her lower back, she reports HEP compliance. Session today focused on quad and HS strengthening as well as balance training. Patient was able to tolerate all prescribed exercises with no adverse effects. Patient continues to benefit from skilled PT services and should be progressed as able to improve functional independence.     OBJECTIVE IMPAIRMENTS Abnormal gait, decreased activity tolerance, difficulty walking, decreased ROM, decreased strength, increased edema, obesity, and pain.     REHAB POTENTIAL: Good  GOALS:   SHORT TERM GOALS: Target date: 02/04/2022   Pt will be Ind in an initial HEP Baseline: started on eval Goal status: MET 02/03/22   2.  Gait will progress to pt walking c 1 crutch or SPC the majority of the time Baseline: crutches Status: no AD, min limp Goal status: MET 02/03/22   LONG TERM GOALS: Target date: 03/18/22   Increase R hip strength to at least 4+/5 and R knee strength to 5/5 for improved function of the R LE Baseline: see flow sheet Goal status: INITIAL   2.  Increase R knee flexion ROM to 125d for improved R knee function  Baseline: see flow sheet Goal status: MET 02/03/22   3.  R knee ext lag c SAQ will increase to -5 for improved R LE function Baseline: -15 Goal status: INITIAL   4.  2MWT will increase to 450 ft as indication of improved function Baseline: 233 ft Goal status: INITIAL   5. Decrease 5XSTS to 20 sec or less as indication of improved function Baseline: 31.7 sec Goal status: MET 02/16/2022 with 17.52 seconds and no use of UE   6.  Pt will complete commmunity ambulation s an AD tolerating distances of 1016f with a mild to no limp over the R LE. Baseline:  Goal status: INITIAL     PLAN: PT FREQUENCY: 2x/week   PT DURATION: 8 weeks   PLANNED INTERVENTIONS: Therapeutic exercises, Therapeutic activity, Neuromuscular re-education,  Balance training, Gait training, Patient/Family education, Joint mobilization, Stair training, Dry Needling, Electrical stimulation, Cryotherapy, Moist heat, scar mobilization, Taping, Vasopneumatic device, Ultrasound, Ionotophoresis 449mml Dexamethasone, and Manual therapy   PLAN FOR NEXT SESSION: assess response to HEP,  progress therex and complete gait training   StEvelene CroonPTA 03/09/22 5:39 PM

## 2022-03-15 ENCOUNTER — Ambulatory Visit: Payer: 59

## 2022-03-15 NOTE — Therapy (Incomplete)
OUTPATIENT PHYSICAL THERAPY TREATMENT NOTE   [name] MRN: [id number] DOB:[birth year], 31 y.o., female Today's Date: 03/15/2022  PCP: Patient, No Pcp Per (Inactive) REFERRING PROVIDER: Corinne Ports, PA-C  END OF SESSION:        Past Medical History:  Diagnosis Date   Anemia    Anxiety    Chlamydia 10/2011   Cholecystitis    Closed head injury with concussion    Gestational hypertension    Headache(784.0)    Heart palpitations    Hypertension    Past Surgical History:  Procedure Laterality Date   ORIF ANKLE FRACTURE Left 02/12/2016   Procedure: OPEN REDUCTION INTERNAL FIXATION (ORIF) ANKLE FRACTURE;  Surgeon: Altamese [place], MD;  Location: [place];  Service: Orthopedics;  Laterality: Left;   ORIF TIBIA PLATEAU Right 11/26/2021   Procedure: OPEN REDUCTION INTERNAL FIXATION (ORIF) TIBIAL PLATEAU;  Surgeon: [name] Needles, MD;  Location: [place];  Service: Orthopedics;  Laterality: Right;   Patient Active Problem List   Diagnosis Date Noted   Right medial tibial plateau fracture 11/24/2021   Fetal pericardial effusion affecting management of mother 08/11/2017   Pre-eclampsia 07/31/2017   Group B streptococcus urinary tract infection affecting pregnancy 04/24/2017   Encounter for supervision of normal pregnancy, unspecified, unspecified trimester 04/10/2017   History of gestational hypertension 04/10/2017   Short interval between pregnancies affecting pregnancy, antepartum 04/10/2017   Murmur, cardiac 02/10/2016    REFERRING DIAG: Right medial tibial plateau fracture  THERAPY DIAG:  No diagnosis found.   SUBJECTIVE:    SUBJECTIVE STATEMENT: *** Patient reports that her hip is feeling ok today, but that her lower back and knee are hurting today. She describes more stiffness than pain.  PAIN:  Are you having pain? Yes: NPRS scale: ***/10 knee, ***/10 lower back Pain location: lower back, knee Pain description: ache, throb Aggravating factors:  Prolonged standing and walking 7/10 Relieving factors: Medications, moving it, rest  PERTINENT HISTORY:  Obesity, L ankle fx    PRECAUTIONS: None   WEIGHT BEARING RESTRICTIONS  WBAT   FALLS:  Has patient fallen in last 6 months? Yes. Number of falls 1   LIVING ENVIRONMENT: Lives with: lives with their family Lives in: Mobile home No issues with accessing home or mobility within home   OCCUPATION: [employer]   PLOF: Independent   PATIENT GOALS To be back to normal as much as posible, to walk more normally     OBJECTIVE:    DIAGNOSTIC FINDINGS: not available   PATIENT SURVEYS:  FOTO 36%  Functional status   COGNITION:           Overall cognitive status: Within functional limits for tasks assessed                          SENSATION: Light touch: NT pt wearing yoga pants   Swelling:            R knee appears swollen, difficult to assess with pt wearing yoga pants   POSTURE:  WNL   PALPATION: TTP of the anterior knee   LE ROM:   Active ROM Right 01/14/2022 Left 01/14/2022 Right  02/03/22  Hip flexion       Hip extension       Hip abduction       Hip adduction       Hip internal rotation       Hip external rotation  Knee flexion 120 130 135  Knee extension 0 0   Ankle dorsiflexion       Ankle plantarflexion       Ankle inversion       Ankle eversion        (Blank rows = not tested) Ext lag R -15d, L -5d   LE MMT:   MMT Right 01/14/2022 Left 01/14/2022  Hip flexion 4 4+  Hip extension 4 5  Hip abduction 4+ 5  Hip adduction 5 5  Hip internal rotation 5 5  Hip external rotation 4 5  Knee flexion 4+ 5  Knee extension 4+ 5  Ankle dorsiflexion 5 5  Ankle plantarflexion 5 5  Ankle inversion      Ankle eversion       (Blank rows = not tested)   FUNCTIONAL TESTS:  5 times sit to stand: 31.7 s use of hands, decreased wt bearing R LE 02/16/2022: 17.52 with no UE use  2 minute walk test: 235 ft     GAIT: Distance walked: 235 ft within  clinic Assistive device utilized: Crutches pt left crutches in car Level of assistance: Complete Independence Comments: Pt left crutches in car. Pt walked with an antlgic gait over the R LE. Pt kept R knee straight       TODAY'S TREATMENT: [place] Adult PT Treatment:                                                DATE: 03/15/2022 Therapeutic Exercise: Nustep level 6 x 5 mins Knee ext machine - 5# - 3x10 - R Knee flexion machine - 10# - 3x10 - R S/L leg press - 35# - 3x10 - R Modified thomas stretch EOM x1' R Rt SLR 3x10 2# Rt single leg bridge 3x10 Bridge 5" hold at top 2x10 Neuromuscular re-ed: Tandem stance x45" BIL Tandem stance on foam x45" BIL Romberg stance on foam with head turns x45" Romberg stance on foam EC x30"   [facility] Adult PT Treatment:                                                DATE: 03/09/2022 Therapeutic Exercise: Nustep level 5 x 5 mins Knee ext machine - 5# - 3x10 - R Knee flexion machine - 10# - 3x10 - R S/L leg press - 35# - 3x10 - R Modified thomas stretch EOM x1' R Rt SLR 3x10 2# Rt single leg bridge 3x10 Neuromuscular re-ed: Tandem stance x45" BIL Tandem stance on foam x45" BIL Romberg stance on foam with head turns x45" Romberg stance on foam EC x30"   [facility] Adult PT Treatment:                                                DATE: 03/03/2022 Therapeutic Exercise: Nustep level 5 x 5 mins Knee ext machine - 5# - 3x10 - R Knee flexion machine - 10# - 3x10 - R S/L leg press - 35# - 3x10 R CR hip flexor stretch on side of table Rt SLR 3x10 2# Rt single leg bridge 3x10  [place] Adult PT Treatment:                                                DATE: 03/02/2022 Therapeutic Exercise: Nustep level 5 x 5 mins Knee flexion stretch on step - 20x Knee ext machine - 5# - 3x10 - R Knee flexion machine - 10# - 3x10 - R R CR hip flexor stretch on side of table Rt SLR 3x10 Rt single leg bridge 2x10      PATIENT EDUCATION:  Education details: Eval findings,  POC, HEP, symptom management Person educated: Patient Education method: Explanation, Demonstration, Tactile cues, Verbal cues, and Handouts Education comprehension: verbalized understanding, returned demonstration, verbal cues required, and tactile cues required     HOME EXERCISE PROGRAM: Access Code: [email or web address] URL: [email or web address] Date: 03/02/2022 Prepared by: Shearon Balo  Exercises - Active Straight Leg Raise with Quad Set  - 1 x daily - 7 x weekly - 2 sets - 10 reps - 3 hold - Sidelying Hip Abduction  - 1 x daily - 7 x weekly - 2 sets - 10 reps - 3 hold - Sit to Stand Without Arm Support  - 1 x daily - 7 x weekly - 2 sets - 10 reps - 3 hold - Seated Knee Extension with Resistance  - 2 x daily - 7 x weekly - 3 sets - 10 reps  ASSESSMENT:   CLINICAL IMPRESSION: ***  Patient presents to PT with mild pain in her knee and moderate to high pain in her lower back, she reports HEP compliance. Session today focused on quad and HS strengthening as well as balance training. Patient was able to tolerate all prescribed exercises with no adverse effects. Patient continues to benefit from skilled PT services and should be progressed as able to improve functional independence.     OBJECTIVE IMPAIRMENTS Abnormal gait, decreased activity tolerance, difficulty walking, decreased ROM, decreased strength, increased edema, obesity, and pain.     REHAB POTENTIAL: Good    GOALS:   SHORT TERM GOALS: Target date: 02/04/2022   Pt will be Ind in an initial HEP Baseline: started on eval Goal status: MET 02/03/22   2.  Gait will progress to pt walking c 1 crutch or SPC the majority of the time Baseline: crutches Status: no AD, min limp Goal status: MET 02/03/22   LONG TERM GOALS: Target date: 03/18/22   Increase R hip strength to at least 4+/5 and R knee strength to 5/5 for improved function of the R LE Baseline: see flow sheet Goal status: INITIAL   2.  Increase R knee  flexion ROM to 125d for improved R knee function  Baseline: see flow sheet Goal status: MET 02/03/22   3.  R knee ext lag c SAQ will increase to -5 for improved R LE function Baseline: -15 Goal status: INITIAL   4.  2MWT will increase to 450 ft as indication of improved function Baseline: 233 ft Goal status: INITIAL   5. Decrease 5XSTS to 20 sec or less as indication of improved function Baseline: 31.7 sec Goal status: MET 02/16/2022 with 17.52 seconds and no use of UE   6.  Pt will complete commmunity ambulation s an AD tolerating distances of 1067f with a mild to no limp over the R LE. Baseline:  Goal status: INITIAL     PLAN: PT  FREQUENCY: 2x/week   PT DURATION: 8 weeks   PLANNED INTERVENTIONS: Therapeutic exercises, Therapeutic activity, Neuromuscular re-education, Balance training, Gait training, Patient/Family education, Joint mobilization, Stair training, Dry Needling, Electrical stimulation, Cryotherapy, Moist heat, scar mobilization, Taping, Vasopneumatic device, Ultrasound, Ionotophoresis 62m/ml Dexamethasone, and Manual therapy   PLAN FOR NEXT SESSION: assess response to HEP,  progress therex and complete gait training   SEvelene Croon PTA 03/15/22 8:55 AM

## 2022-03-17 ENCOUNTER — Ambulatory Visit: Payer: 59

## 2022-03-17 NOTE — Therapy (Incomplete)
OUTPATIENT PHYSICAL THERAPY TREATMENT NOTE   [name] MRN: [id number] DOB:Sep 24, 1991, 31 y.o., female Today's Date: 03/17/2022  PCP: Patient, No Pcp Per REFERRING PROVIDER: Corinne Ports, PA-C  END OF SESSION:        Past Medical History:  Diagnosis Date   Anemia    Anxiety    Chlamydia 10/2011   Cholecystitis    Closed head injury with concussion    Gestational hypertension    Headache(784.0)    Heart palpitations    Hypertension    Past Surgical History:  Procedure Laterality Date   ORIF ANKLE FRACTURE Left 02/12/2016   Procedure: OPEN REDUCTION INTERNAL FIXATION (ORIF) ANKLE FRACTURE;  Surgeon: Altamese Arbyrd, MD;  Location: [place];  Service: Orthopedics;  Laterality: Left;   ORIF TIBIA PLATEAU Right 11/26/2021   Procedure: OPEN REDUCTION INTERNAL FIXATION (ORIF) TIBIAL PLATEAU;  Surgeon: [name] Needles, MD;  Location: [place] [facility];  Service: Orthopedics;  Laterality: Right;   Patient Active Problem List   Diagnosis Date Noted   Right medial tibial plateau fracture 11/24/2021   Fetal pericardial effusion affecting management of mother 08/11/2017   Pre-eclampsia 07/31/2017   Group B streptococcus urinary tract infection affecting pregnancy 04/24/2017   Encounter for supervision of normal pregnancy, unspecified, unspecified trimester 04/10/2017   History of gestational hypertension 04/10/2017   Short interval between pregnancies affecting pregnancy, antepartum 04/10/2017   Murmur, cardiac 02/10/2016    REFERRING DIAG: Right medial tibial plateau fracture  THERAPY DIAG:  No diagnosis found.   SUBJECTIVE:    SUBJECTIVE STATEMENT: *** Patient reports that her hip is feeling ok today, but that her lower back and knee are hurting today. She describes more stiffness than pain.  PAIN:  Are you having pain? Yes: NPRS scale: ***/10 knee, ***/10 lower back Pain location: lower back, knee Pain description: ache, throb Aggravating factors: Prolonged  standing and walking 7/10 Relieving factors: Medications, moving it, rest  PERTINENT HISTORY:  Obesity, L ankle fx    PRECAUTIONS: None   WEIGHT BEARING RESTRICTIONS  WBAT   FALLS:  Has patient fallen in last 6 months? Yes. Number of falls 1   LIVING ENVIRONMENT: Lives with: lives with their family Lives in: Mobile home No issues with accessing home or mobility within home   OCCUPATION: [employer]   PLOF: Independent   PATIENT GOALS To be back to normal as much as posible, to walk more normally     OBJECTIVE:    DIAGNOSTIC FINDINGS: not available   PATIENT SURVEYS:  FOTO 36%  Functional status   COGNITION:           Overall cognitive status: Within functional limits for tasks assessed                          SENSATION: Light touch: NT pt wearing yoga pants   Swelling:            R knee appears swollen, difficult to assess with pt wearing yoga pants   POSTURE:  WNL   PALPATION: TTP of the anterior knee   LE ROM:   Active ROM Right 01/14/2022 Left 01/14/2022 Right  02/03/22  Hip flexion       Hip extension       Hip abduction       Hip adduction       Hip internal rotation       Hip external rotation  Knee flexion 120 130 135  Knee extension 0 0   Ankle dorsiflexion       Ankle plantarflexion       Ankle inversion       Ankle eversion        (Blank rows = not tested) Ext lag R -15d, L -5d   LE MMT:   MMT Right 01/14/2022 Left 01/14/2022  Hip flexion 4 4+  Hip extension 4 5  Hip abduction 4+ 5  Hip adduction 5 5  Hip internal rotation 5 5  Hip external rotation 4 5  Knee flexion 4+ 5  Knee extension 4+ 5  Ankle dorsiflexion 5 5  Ankle plantarflexion 5 5  Ankle inversion      Ankle eversion       (Blank rows = not tested)   FUNCTIONAL TESTS:  5 times sit to stand: 31.7 s use of hands, decreased wt bearing R LE 02/16/2022: 17.52 with no UE use  2 minute walk test: 235 ft     GAIT: Distance walked: 235 ft within  clinic Assistive device utilized: Crutches pt left crutches in car Level of assistance: Complete Independence Comments: Pt left crutches in car. Pt walked with an antlgic gait over the R LE. Pt kept R knee straight       TODAY'S TREATMENT: [facility] Adult PT Treatment:                                                DATE: 03/17/2022 Therapeutic Exercise: Nustep level 6 x 5 mins Knee ext machine - 5# - 3x10 - R Knee flexion machine - 10# - 3x10 - R S/L leg press - 35# - 3x10 - R Modified thomas stretch EOM x1' R Rt SLR 3x10 2# Rt single leg bridge 3x10 Bridge 5" hold at top 2x10 Neuromuscular re-ed: Tandem stance x45" BIL Tandem stance on foam x45" BIL Romberg stance on foam with head turns x45" Romberg stance on foam EC x30"   [facility] Adult PT Treatment:                                                DATE: 03/09/2022 Therapeutic Exercise: Nustep level 5 x 5 mins Knee ext machine - 5# - 3x10 - R Knee flexion machine - 10# - 3x10 - R S/L leg press - 35# - 3x10 - R Modified thomas stretch EOM x1' R Rt SLR 3x10 2# Rt single leg bridge 3x10 Neuromuscular re-ed: Tandem stance x45" BIL Tandem stance on foam x45" BIL Romberg stance on foam with head turns x45" Romberg stance on foam EC x30"   [facility] Adult PT Treatment:                                                DATE: 03/03/2022 Therapeutic Exercise: Nustep level 5 x 5 mins Knee ext machine - 5# - 3x10 - R Knee flexion machine - 10# - 3x10 - R S/L leg press - 35# - 3x10 R CR hip flexor stretch on side of table Rt SLR 3x10 2# Rt single leg bridge 3x10  PATIENT EDUCATION:  Education details: Eval findings, POC, HEP, symptom management Person educated: Patient Education method: Explanation, Demonstration, Tactile cues, Verbal cues, and Handouts Education comprehension: verbalized understanding, returned demonstration, verbal cues required, and tactile cues required     HOME EXERCISE PROGRAM: Access Code: [email or web address] URL:  [email or web address] Date: 03/02/2022 Prepared by: Shearon Balo  Exercises - Active Straight Leg Raise with Quad Set  - 1 x daily - 7 x weekly - 2 sets - 10 reps - 3 hold - Sidelying Hip Abduction  - 1 x daily - 7 x weekly - 2 sets - 10 reps - 3 hold - Sit to Stand Without Arm Support  - 1 x daily - 7 x weekly - 2 sets - 10 reps - 3 hold - Seated Knee Extension with Resistance  - 2 x daily - 7 x weekly - 3 sets - 10 reps  ASSESSMENT:   CLINICAL IMPRESSION: ***  Patient presents to PT with mild pain in her knee and moderate to high pain in her lower back, she reports HEP compliance. Session today focused on quad and HS strengthening as well as balance training. Patient was able to tolerate all prescribed exercises with no adverse effects. Patient continues to benefit from skilled PT services and should be progressed as able to improve functional independence.     OBJECTIVE IMPAIRMENTS Abnormal gait, decreased activity tolerance, difficulty walking, decreased ROM, decreased strength, increased edema, obesity, and pain.     REHAB POTENTIAL: Good    GOALS:   SHORT TERM GOALS: Target date: 02/04/2022   Pt will be Ind in an initial HEP Baseline: started on eval Goal status: MET 02/03/22   2.  Gait will progress to pt walking c 1 crutch or SPC the majority of the time Baseline: crutches Status: no AD, min limp Goal status: MET 02/03/22   LONG TERM GOALS: Target date: 03/18/22   Increase R hip strength to at least 4+/5 and R knee strength to 5/5 for improved function of the R LE Baseline: see flow sheet Goal status: INITIAL   2.  Increase R knee flexion ROM to 125d for improved R knee function  Baseline: see flow sheet Goal status: MET 02/03/22   3.  R knee ext lag c SAQ will increase to -5 for improved R LE function Baseline: -15 Goal status: INITIAL   4.  2MWT will increase to 450 ft as indication of improved function Baseline: 233 ft Goal status: INITIAL    5. Decrease 5XSTS to 20 sec or less as indication of improved function Baseline: 31.7 sec Goal status: MET 02/16/2022 with 17.52 seconds and no use of UE   6.  Pt will complete commmunity ambulation s an AD tolerating distances of 1043f with a mild to no limp over the R LE. Baseline:  Goal status: INITIAL     PLAN: PT FREQUENCY: 2x/week   PT DURATION: 8 weeks   PLANNED INTERVENTIONS: Therapeutic exercises, Therapeutic activity, Neuromuscular re-education, Balance training, Gait training, Patient/Family education, Joint mobilization, Stair training, Dry Needling, Electrical stimulation, Cryotherapy, Moist heat, scar mobilization, Taping, Vasopneumatic device, Ultrasound, Ionotophoresis 422mml Dexamethasone, and Manual therapy   PLAN FOR NEXT SESSION: assess response to HEP,  progress therex and complete gait training   StEvelene CroonPTA 03/17/22 9:10 AM

## 2022-03-22 ENCOUNTER — Ambulatory Visit: Payer: 59

## 2022-03-22 DIAGNOSIS — M6281 Muscle weakness (generalized): Secondary | ICD-10-CM

## 2022-03-22 DIAGNOSIS — R262 Difficulty in walking, not elsewhere classified: Secondary | ICD-10-CM

## 2022-03-22 NOTE — Therapy (Signed)
OUTPATIENT PHYSICAL THERAPY TREATMENT NOTE/RE-CERTIFICATION   Progress Note Reporting Period 01/14/2022 to 03/22/2022  See note below for Objective Data and Assessment of Progress/Goals.   [name] MRN: [id number] DOB:[birth year], 31 y.o., female Today's Date: 03/22/2022  PCP: Patient, No Pcp Per REFERRING PROVIDER: Corinne Ports, PA-C  END OF SESSION:   PT End of Session - 03/22/22 1444     Visit Number 11    Number of Visits 22    Date for PT Re-Evaluation 05/03/22    Authorization Type [employer] OTHER;[place] MEDICAID [employer]    Progress Note Due on Visit --    PT Start Time 1445    PT Stop Time 1523    PT Time Calculation (min) 38 min    Activity Tolerance Patient tolerated treatment well    Behavior During Therapy [facility] for tasks assessed/performed                Past Medical History:  Diagnosis Date   Anemia    Anxiety    Chlamydia 10/2011   Cholecystitis    Closed head injury with concussion    Gestational hypertension    Headache(784.0)    Heart palpitations    Hypertension    Past Surgical History:  Procedure Laterality Date   ORIF ANKLE FRACTURE Left 02/12/2016   Procedure: OPEN REDUCTION INTERNAL FIXATION (ORIF) ANKLE FRACTURE;  Surgeon: Altamese [place], MD;  Location: [place];  Service: Orthopedics;  Laterality: Left;   ORIF TIBIA PLATEAU Right 11/26/2021   Procedure: OPEN REDUCTION INTERNAL FIXATION (ORIF) TIBIAL PLATEAU;  Surgeon: [name] Needles, MD;  Location: [place];  Service: Orthopedics;  Laterality: Right;   Patient Active Problem List   Diagnosis Date Noted   Right medial tibial plateau fracture 11/24/2021   Fetal pericardial effusion affecting management of mother 08/11/2017   Pre-eclampsia 07/31/2017   Group B streptococcus urinary tract infection affecting pregnancy 04/24/2017   Encounter for supervision of normal pregnancy, unspecified, unspecified trimester 04/10/2017   History of gestational  hypertension 04/10/2017   Short interval between pregnancies affecting pregnancy, antepartum 04/10/2017   Murmur, cardiac 02/10/2016    REFERRING DIAG: Right medial tibial plateau fracture  THERAPY DIAG:  Muscle weakness (generalized) - Plan: PT plan of care cert/re-cert  Difficulty in walking, not elsewhere classified - Plan: PT plan of care cert/re-cert   SUBJECTIVE:    SUBJECTIVE STATEMENT: Pt presents to PT with reports of continued R knee pain, although it is less than last session. Has a follow up visit with MD this week. Has been compliant with HEP with HEP with no adverse effect. Pt is ready to begin PT at this time.   PAIN:  Are you having pain?  Yes: NPRS scale: 2/10 knee Pain location: lower back, knee Pain description: ache, throb Aggravating factors: Prolonged standing and walking 7/10 Relieving factors: Medications, moving it, rest  PERTINENT HISTORY:  Obesity, L ankle fx    PRECAUTIONS: None   WEIGHT BEARING RESTRICTIONS  WBAT   FALLS:  Has patient fallen in last 6 months? Yes. Number of falls 1   LIVING ENVIRONMENT: Lives with: lives with their family Lives in: Mobile home No issues with accessing home or mobility within home   OCCUPATION: [employer]   PLOF: Independent   PATIENT GOALS To be back to normal as much as posible, to walk more normally     OBJECTIVE:      PATIENT SURVEYS:  FOTO 36%  Functional status  LE ROM:   Active ROM Right 01/14/2022 Left 01/14/2022 Right  02/03/22  Hip flexion       Hip extension       Hip abduction       Hip adduction       Hip internal rotation       Hip external rotation       Knee flexion 120 130 135  Knee extension 0 0   Ankle dorsiflexion       Ankle plantarflexion       Ankle inversion       Ankle eversion        (Blank rows = not tested) Ext lag R -15d, L -5d   LE MMT:   MMT Right 01/14/2022 Left 01/14/2022  Hip flexion 4 4+  Hip extension 4 5  Hip abduction 4+ 5  Hip  adduction 5 5  Hip internal rotation 5 5  Hip external rotation 4 5  Knee flexion 4+ 5  Knee extension 4+ 5  Ankle dorsiflexion 5 5  Ankle plantarflexion 5 5  Ankle inversion      Ankle eversion       (Blank rows = not tested)   FUNCTIONAL TESTS:  5 times sit to stand: 31.7 s use of hands, decreased wt bearing R LE 02/16/2022: 17.52 with no UE use  2 minute walk test: 235 ft     GAIT: Distance walked: 37f Assistive device utilized: None Level of assistance: Complete Independence  Comments: antalgic gait on R        TODAY'S TREATMENT: [facility] Adult PT Treatment:                                                DATE: 03/22/2022 Therapeutic Exercise: Nustep level 5 x 5 mins LE only STS 2x10 10# KB Standing hip abd/ext 2x10 30# Knee ext machine - 5# - 3x10 - R Knee flexion machine - 10# - 2x10 - R S/L leg press - 35# - 3x10 - R Rt SLR 3x10 3# Wall squat 2x10   [facility] Adult PT Treatment:                                                DATE: 03/09/2022 Therapeutic Exercise: Nustep level 5 x 5 mins Knee ext machine - 5# - 3x10 - R Knee flexion machine - 10# - 3x10 - R S/L leg press - 35# - 3x10 - R Modified thomas stretch EOM x1' R Rt SLR 3x10 2# Rt single leg bridge 3x10 Neuromuscular re-ed: Tandem stance x45" BIL Tandem stance on foam x45" BIL Romberg stance on foam with head turns x45" Romberg stance on foam EC x30"   PATIENT EDUCATION:  Education details: HEP and POC update Person educated: Patient Education method: Explanation, Demonstration, Tactile cues, Verbal cues, and Handouts Education comprehension: verbalized understanding, returned demonstration, verbal cues required, and tactile cues required     HOME EXERCISE PROGRAM: Access Code: [email or web address] URL: [email or web address] Date: 03/02/2022 Prepared by: KShearon Balo Exercises - Active Straight Leg Raise with Quad Set  - 1 x daily - 7 x weekly - 2 sets - 10 reps - 3 hold - Sidelying Hip  Abduction  - 1 x daily -  7 x weekly - 2 sets - 10 reps - 3 hold - Sit to Stand Without Arm Support  - 1 x daily - 7 x weekly - 2 sets - 10 reps - 3 hold - Seated Knee Extension with Resistance  - 2 x daily - 7 x weekly - 3 sets - 10 reps  ASSESSMENT:   CLINICAL IMPRESSION: Pt was able to complete prescribed exercises with no adverse effect or increase in pain. Over the course of PT treatment she has progressed well, showing improvement in strength and functional mobility. She still has significant gait and R LE strength impairments that are limiting her ability to get back to PLOF. She continues to benefit from skilled PT services working on LE strengthening with particular emphasis on R quad and proximal hip. Will continue per POC as prescribed.     OBJECTIVE IMPAIRMENTS Abnormal gait, decreased activity tolerance, difficulty walking, decreased ROM, decreased strength, increased edema, obesity, and pain.     ACTIVITY LIMITATIONS cleaning, community activity, driving, meal prep, occupation, yard work, and shopping.    PERSONAL FACTORS 1 comorbidity: obesity  are also affecting patient's functional outcome.    GOALS:   SHORT TERM GOALS: Target date: 02/04/2022   Pt will be Ind in an initial HEP Baseline: started on eval Goal status: MET 02/03/22   2.  Gait will progress to pt walking c 1 crutch or SPC the majority of the time Baseline: crutches Status: no AD, min limp Goal status: MET 02/03/22   LONG TERM GOALS: Target date: 05/03/2022   Increase R hip strength to at least 4+/5 and R knee strength to 5/5 for improved function of the R LE Baseline: see flow sheet Goal status: INITIAL   2.  Increase R knee flexion ROM to 125d for improved R knee function  Baseline: see flow sheet Goal status: MET 02/03/22   3.  R knee ext lag c SAQ will increase to -5 for improved R LE function Baseline: -15 Goal status: INITIAL   4.  2MWT will increase to 450 ft as indication of improved  function Baseline: 233 ft Goal status: INITIAL   5. Decrease 5XSTS to 20 sec or less as indication of improved function Baseline: 31.7 sec Goal status: MET 02/16/2022 with 17.52 seconds and no use of UE   6.  Pt will complete commmunity ambulation s an AD tolerating distances of 1035f with a mild to no limp over the R LE. Baseline:  Goal status: INITIAL     PLAN: PT FREQUENCY: 2x/week   PT DURATION: 8 weeks   PLANNED INTERVENTIONS: Therapeutic exercises, Therapeutic activity, Neuromuscular re-education, Balance training, Gait training, Patient/Family education, Joint mobilization, Stair training, Dry Needling, Electrical stimulation, Cryotherapy, Moist heat, scar mobilization, Taping, Vasopneumatic device, Ultrasound, Ionotophoresis 41mml Dexamethasone, and Manual therapy   PLAN FOR NEXT SESSION: assess response to HEP,  progress therex and complete gait training   DaWard ChattersT  03/22/22 3:24 PM

## 2022-03-29 DIAGNOSIS — S82141D Displaced bicondylar fracture of right tibia, subsequent encounter for closed fracture with routine healing: Secondary | ICD-10-CM | POA: Diagnosis not present

## 2022-03-29 DIAGNOSIS — S82131D Displaced fracture of medial condyle of right tibia, subsequent encounter for closed fracture with routine healing: Secondary | ICD-10-CM | POA: Diagnosis not present

## 2022-04-04 ENCOUNTER — Ambulatory Visit: Payer: 59 | Attending: Student

## 2022-04-04 DIAGNOSIS — M6281 Muscle weakness (generalized): Secondary | ICD-10-CM | POA: Insufficient documentation

## 2022-04-04 DIAGNOSIS — R262 Difficulty in walking, not elsewhere classified: Secondary | ICD-10-CM | POA: Diagnosis present

## 2022-04-04 NOTE — Therapy (Signed)
OUTPATIENT PHYSICAL THERAPY TREATMENT NOTE   [name] MRN: [id number] DOB:1991/07/10, 31 y.o., female Today's Date: 04/04/2022  PCP: Patient, No Pcp Per REFERRING PROVIDER: Corinne Ports, PA-C  END OF SESSION:   PT End of Session - 04/04/22 1409     Visit Number 12    Number of Visits 22    Date for PT Re-Evaluation 05/03/22    Authorization Type [employer] OTHER;[place] MEDICAID [employer]    Progress Note Due on Visit 10    PT Start Time 1415    PT Stop Time 1455    PT Time Calculation (min) 40 min    Activity Tolerance Patient tolerated treatment well    Behavior During Therapy [facility] for tasks assessed/performed                 Past Medical History:  Diagnosis Date   Anemia    Anxiety    Chlamydia 10/2011   Cholecystitis    Closed head injury with concussion    Gestational hypertension    Headache(784.0)    Heart palpitations    Hypertension    Past Surgical History:  Procedure Laterality Date   ORIF ANKLE FRACTURE Left 02/12/2016   Procedure: OPEN REDUCTION INTERNAL FIXATION (ORIF) ANKLE FRACTURE;  Surgeon: Altamese Ramsey, MD;  Location: [place];  Service: Orthopedics;  Laterality: Left;   ORIF TIBIA PLATEAU Right 11/26/2021   Procedure: OPEN REDUCTION INTERNAL FIXATION (ORIF) TIBIAL PLATEAU;  Surgeon: [name] Needles, MD;  Location: [place];  Service: Orthopedics;  Laterality: Right;   Patient Active Problem List   Diagnosis Date Noted   Right medial tibial plateau fracture 11/24/2021   Fetal pericardial effusion affecting management of mother 08/11/2017   Pre-eclampsia 07/31/2017   Group B streptococcus urinary tract infection affecting pregnancy 04/24/2017   Encounter for supervision of normal pregnancy, unspecified, unspecified trimester 04/10/2017   History of gestational hypertension 04/10/2017   Short interval between pregnancies affecting pregnancy, antepartum 04/10/2017   Murmur, cardiac 02/10/2016    REFERRING  DIAG: Right medial tibial plateau fracture  THERAPY DIAG:  Muscle weakness (generalized)  Difficulty in walking, not elsewhere classified   SUBJECTIVE:    SUBJECTIVE STATEMENT: Pt presents to PT with reports of continued R knee pain. She reports her MD put her on gabapentin last week for her knee pain and she states it is helping a little, but that it makes her sleepy.  PAIN:  Are you having pain?  Yes: NPRS scale: 6/10 knee Pain location: lower back, knee Pain description: ache, throb Aggravating factors: Prolonged standing and walking 7/10 Relieving factors: Medications, moving it, rest  PERTINENT HISTORY:  Obesity, L ankle fx    PRECAUTIONS: None   WEIGHT BEARING RESTRICTIONS  WBAT   FALLS:  Has patient fallen in last 6 months? Yes. Number of falls 1   LIVING ENVIRONMENT: Lives with: lives with their family Lives in: Mobile home No issues with accessing home or mobility within home   OCCUPATION: [employer]   PLOF: Independent   PATIENT GOALS To be back to normal as much as posible, to walk more normally     OBJECTIVE:      PATIENT SURVEYS:  FOTO 36%  Functional status   LE ROM:   Active ROM Right 01/14/2022 Left 01/14/2022 Right  02/03/22  Hip flexion       Hip extension       Hip abduction       Hip adduction  Hip internal rotation       Hip external rotation       Knee flexion 120 130 135  Knee extension 0 0   Ankle dorsiflexion       Ankle plantarflexion       Ankle inversion       Ankle eversion        (Blank rows = not tested) Ext lag R -15d, L -5d   LE MMT:   MMT Right 01/14/2022 Left 01/14/2022  Hip flexion 4 4+  Hip extension 4 5  Hip abduction 4+ 5  Hip adduction 5 5  Hip internal rotation 5 5  Hip external rotation 4 5  Knee flexion 4+ 5  Knee extension 4+ 5  Ankle dorsiflexion 5 5  Ankle plantarflexion 5 5  Ankle inversion      Ankle eversion       (Blank rows = not tested)   FUNCTIONAL TESTS:  5 times sit to  stand: 31.7 s use of hands, decreased wt bearing R LE 02/16/2022: 17.52 with no UE use  2 minute walk test: 235 ft     GAIT: Distance walked: 69f Assistive device utilized: None Level of assistance: Complete Independence  Comments: antalgic gait on R        TODAY'S TREATMENT: [facility] Adult PT Treatment:                                                DATE: 04/04/2022 Therapeutic Exercise: Nustep level 6 x 5 mins LE only STS 2x10 15# KB Slant board gastroc stretch x2' Knee ext machine - 5# - 3x10 - R Knee flexion machine - 10# - 3x10 - R S/L leg press - 35# - 3x10 - R Wall squat 2x10  Neuromuscular re-ed: Tandem stance x45" BIL Tandem stance on foam x45" BIL Romberg stance on foam EC x45"   [facility] Adult PT Treatment:                                                DATE: 03/22/2022 Therapeutic Exercise: Nustep level 5 x 5 mins LE only STS 2x10 10# KB Standing hip abd/ext 2x10 30# Knee ext machine - 5# - 3x10 - R Knee flexion machine - 10# - 2x10 - R S/L leg press - 35# - 3x10 - R Rt SLR 3x10 3# Wall squat 2x10   [facility] Adult PT Treatment:                                                DATE: 03/09/2022 Therapeutic Exercise: Nustep level 5 x 5 mins Knee ext machine - 5# - 3x10 - R Knee flexion machine - 10# - 3x10 - R S/L leg press - 35# - 3x10 - R Modified thomas stretch EOM x1' R Rt SLR 3x10 2# Rt single leg bridge 3x10 Neuromuscular re-ed: Tandem stance x45" BIL Tandem stance on foam x45" BIL Romberg stance on foam with head turns x45" Romberg stance on foam EC x30"   PATIENT EDUCATION:  Education details: HEP and POC update Person educated: Patient Education method:  Explanation, Demonstration, Tactile cues, Verbal cues, and Handouts Education comprehension: verbalized understanding, returned demonstration, verbal cues required, and tactile cues required     HOME EXERCISE PROGRAM: Access Code: [email or web address] URL: [email or web address] Date:  03/02/2022 Prepared by: Shearon Balo  Exercises - Active Straight Leg Raise with Quad Set  - 1 x daily - 7 x weekly - 2 sets - 10 reps - 3 hold - Sidelying Hip Abduction  - 1 x daily - 7 x weekly - 2 sets - 10 reps - 3 hold - Sit to Stand Without Arm Support  - 1 x daily - 7 x weekly - 2 sets - 10 reps - 3 hold - Seated Knee Extension with Resistance  - 2 x daily - 7 x weekly - 3 sets - 10 reps  ASSESSMENT:   CLINICAL IMPRESSION: Patient presents to PT with continued LBP and R knee pain and reports HEP compliance. Session today focused on RLE and core strengthening as well as balance tasks. Patient was able to tolerate all prescribed exercises with no adverse effects. Patient continues to benefit from skilled PT services and should be progressed as able to improve functional independence.     OBJECTIVE IMPAIRMENTS Abnormal gait, decreased activity tolerance, difficulty walking, decreased ROM, decreased strength, increased edema, obesity, and pain.     ACTIVITY LIMITATIONS cleaning, community activity, driving, meal prep, occupation, yard work, and shopping.    PERSONAL FACTORS 1 comorbidity: obesity  are also affecting patient's functional outcome.    GOALS:   SHORT TERM GOALS: Target date: 02/04/2022   Pt will be Ind in an initial HEP Baseline: started on eval Goal status: MET 02/03/22   2.  Gait will progress to pt walking c 1 crutch or SPC the majority of the time Baseline: crutches Status: no AD, min limp Goal status: MET 02/03/22   LONG TERM GOALS: Target date: 05/03/2022   Increase R hip strength to at least 4+/5 and R knee strength to 5/5 for improved function of the R LE Baseline: see flow sheet Goal status: INITIAL   2.  Increase R knee flexion ROM to 125d for improved R knee function  Baseline: see flow sheet Goal status: MET 02/03/22   3.  R knee ext lag c SAQ will increase to -5 for improved R LE function Baseline: -15 Goal status: INITIAL   4.  2MWT will  increase to 450 ft as indication of improved function Baseline: 233 ft Goal status: INITIAL   5. Decrease 5XSTS to 20 sec or less as indication of improved function Baseline: 31.7 sec Goal status: MET 02/16/2022 with 17.52 seconds and no use of UE   6.  Pt will complete commmunity ambulation s an AD tolerating distances of 1036f with a mild to no limp over the R LE. Baseline:  Goal status: INITIAL     PLAN: PT FREQUENCY: 2x/week   PT DURATION: 8 weeks   PLANNED INTERVENTIONS: Therapeutic exercises, Therapeutic activity, Neuromuscular re-education, Balance training, Gait training, Patient/Family education, Joint mobilization, Stair training, Dry Needling, Electrical stimulation, Cryotherapy, Moist heat, scar mobilization, Taping, Vasopneumatic device, Ultrasound, Ionotophoresis 453mml Dexamethasone, and Manual therapy   PLAN FOR NEXT SESSION: assess response to HEP,  progress therex and complete gait training   StEvelene CroonTA  04/04/22 2:55 PM

## 2022-04-06 ENCOUNTER — Ambulatory Visit: Payer: 59

## 2022-04-06 DIAGNOSIS — M6281 Muscle weakness (generalized): Secondary | ICD-10-CM | POA: Diagnosis not present

## 2022-04-06 DIAGNOSIS — R262 Difficulty in walking, not elsewhere classified: Secondary | ICD-10-CM

## 2022-04-06 NOTE — Therapy (Signed)
OUTPATIENT PHYSICAL THERAPY TREATMENT NOTE   [name] MRN: [id number] DOB:August 31, 1991, 31 y.o., female Today's Date: 04/06/2022  PCP: Patient, No Pcp Per REFERRING PROVIDER: Corinne Ports, PA-C  END OF SESSION:   PT End of Session - 04/06/22 1659     Visit Number 13    Number of Visits 22    Date for PT Re-Evaluation 05/03/22    Authorization Type [employer] OTHER;[place] MEDICAID [employer]    Progress Note Due on Visit 10    PT Start Time 1700    PT Stop Time 1740    PT Time Calculation (min) 40 min    Activity Tolerance Patient tolerated treatment well    Behavior During Therapy [facility] for tasks assessed/performed                  Past Medical History:  Diagnosis Date   Anemia    Anxiety    Chlamydia 10/2011   Cholecystitis    Closed head injury with concussion    Gestational hypertension    Headache(784.0)    Heart palpitations    Hypertension    Past Surgical History:  Procedure Laterality Date   ORIF ANKLE FRACTURE Left 02/12/2016   Procedure: OPEN REDUCTION INTERNAL FIXATION (ORIF) ANKLE FRACTURE;  Surgeon: Altamese Anacortes, MD;  Location: [place];  Service: Orthopedics;  Laterality: Left;   ORIF TIBIA PLATEAU Right 11/26/2021   Procedure: OPEN REDUCTION INTERNAL FIXATION (ORIF) TIBIAL PLATEAU;  Surgeon: [name] Needles, MD;  Location: [place];  Service: Orthopedics;  Laterality: Right;   Patient Active Problem List   Diagnosis Date Noted   Right medial tibial plateau fracture 11/24/2021   Fetal pericardial effusion affecting management of mother 08/11/2017   Pre-eclampsia 07/31/2017   Group B streptococcus urinary tract infection affecting pregnancy 04/24/2017   Encounter for supervision of normal pregnancy, unspecified, unspecified trimester 04/10/2017   History of gestational hypertension 04/10/2017   Short interval between pregnancies affecting pregnancy, antepartum 04/10/2017   Murmur, cardiac 02/10/2016     REFERRING DIAG: Right medial tibial plateau fracture  THERAPY DIAG:  Muscle weakness (generalized)  Difficulty in walking, not elsewhere classified   SUBJECTIVE:    SUBJECTIVE STATEMENT: Pt presents to PT with reports of continued R knee pain, though lessened today.   PAIN:  Are you having pain?  Yes: NPRS scale: 2/10 knee Pain location: lower back, knee Pain description: ache, throb Aggravating factors: Prolonged standing and walking 7/10 Relieving factors: Medications, moving it, rest  PERTINENT HISTORY:  Obesity, L ankle fx    PRECAUTIONS: None   WEIGHT BEARING RESTRICTIONS  WBAT   FALLS:  Has patient fallen in last 6 months? Yes. Number of falls 1   LIVING ENVIRONMENT: Lives with: lives with their family Lives in: Mobile home No issues with accessing home or mobility within home   OCCUPATION: [employer]   PLOF: Independent   PATIENT GOALS To be back to normal as much as posible, to walk more normally     OBJECTIVE:      PATIENT SURVEYS:  FOTO 36%  Functional status   LE ROM:   Active ROM Right 01/14/2022 Left 01/14/2022 Right  02/03/22  Hip flexion       Hip extension       Hip abduction       Hip adduction       Hip internal rotation       Hip external rotation       Knee  flexion 120 130 135  Knee extension 0 0   Ankle dorsiflexion       Ankle plantarflexion       Ankle inversion       Ankle eversion        (Blank rows = not tested) Ext lag R -15d, L -5d   LE MMT:   MMT Right 01/14/2022 Left 01/14/2022  Hip flexion 4 4+  Hip extension 4 5  Hip abduction 4+ 5  Hip adduction 5 5  Hip internal rotation 5 5  Hip external rotation 4 5  Knee flexion 4+ 5  Knee extension 4+ 5  Ankle dorsiflexion 5 5  Ankle plantarflexion 5 5  Ankle inversion      Ankle eversion       (Blank rows = not tested)   FUNCTIONAL TESTS:  5 times sit to stand: 31.7 s use of hands, decreased wt bearing R LE 02/16/2022: 17.52 with no UE use  2 minute  walk test: 235 ft     GAIT: Distance walked: 57f Assistive device utilized: None Level of assistance: Complete Independence  Comments: antalgic gait on R        TODAY'S TREATMENT: [facility] Adult PT Treatment:                                                DATE: 04/06/2022 Therapeutic Exercise: Nustep level 6 x 5 mins LE only Slant board gastroc stretch x2' Knee ext machine - 5# - 3x10 - R only Knee flexion machine - 10# - 3x10 - R S/L leg press - 35# - 3x10 - R Wall squat 2x10  Neuromuscular re-ed: Tandem stance x45" BIL Tandem stance on foam x45" BIL Romberg stance on foam EC x45" Slow marching on airex x10 BIL SLS x30" Rt   [facility] Adult PT Treatment:                                                DATE: 04/04/2022 Therapeutic Exercise: Nustep level 6 x 5 mins LE only STS 2x10 15# KB Slant board gastroc stretch x2' Knee ext machine - 5# - 3x10 - R Knee flexion machine - 10# - 3x10 - R S/L leg press - 35# - 3x10 - R Wall squat 2x10  Neuromuscular re-ed: Tandem stance x45" BIL Tandem stance on foam x45" BIL Romberg stance on foam EC x45"   [facility] Adult PT Treatment:                                                DATE: 03/22/2022 Therapeutic Exercise: Nustep level 5 x 5 mins LE only STS 2x10 10# KB Standing hip abd/ext 2x10 30# Knee ext machine - 5# - 3x10 - R Knee flexion machine - 10# - 2x10 - R S/L leg press - 35# - 3x10 - R Rt SLR 3x10 3# Wall squat 2x10    PATIENT EDUCATION:  Education details: HEP and POC update Person educated: Patient Education method: Explanation, Demonstration, Tactile cues, Verbal cues, and Handouts Education comprehension: verbalized understanding, returned demonstration, verbal cues required, and tactile cues  required     HOME EXERCISE PROGRAM: Access Code: [email or web address] URL: [email or web address] Date: 03/02/2022 Prepared by: Shearon Balo  Exercises - Active Straight Leg Raise with Quad Set  - 1 x daily - 7 x weekly - 2  sets - 10 reps - 3 hold - Sidelying Hip Abduction  - 1 x daily - 7 x weekly - 2 sets - 10 reps - 3 hold - Sit to Stand Without Arm Support  - 1 x daily - 7 x weekly - 2 sets - 10 reps - 3 hold - Seated Knee Extension with Resistance  - 2 x daily - 7 x weekly - 3 sets - 10 reps  ASSESSMENT:   CLINICAL IMPRESSION: Patient presents to PT with decreased overall pain this session. Session today continued to focus on LE and core strengthening as well as balance tasks to improve proprioception. Her balance is improving as demonstrated by increased SLS stance on Rt with no increase in knee pain. Patient was able to tolerate all prescribed exercises with no adverse effects. Patient continues to benefit from skilled PT services and should be progressed as able to improve functional independence.   OBJECTIVE IMPAIRMENTS Abnormal gait, decreased activity tolerance, difficulty walking, decreased ROM, decreased strength, increased edema, obesity, and pain.     ACTIVITY LIMITATIONS cleaning, community activity, driving, meal prep, occupation, yard work, and shopping.    PERSONAL FACTORS 1 comorbidity: obesity  are also affecting patient's functional outcome.    GOALS:   SHORT TERM GOALS: Target date: 02/04/2022   Pt will be Ind in an initial HEP Baseline: started on eval Goal status: MET 02/03/22   2.  Gait will progress to pt walking c 1 crutch or SPC the majority of the time Baseline: crutches Status: no AD, min limp Goal status: MET 02/03/22   LONG TERM GOALS: Target date: 05/03/2022   Increase R hip strength to at least 4+/5 and R knee strength to 5/5 for improved function of the R LE Baseline: see flow sheet Goal status: INITIAL   2.  Increase R knee flexion ROM to 125d for improved R knee function  Baseline: see flow sheet Goal status: MET 02/03/22   3.  R knee ext lag c SAQ will increase to -5 for improved R LE function Baseline: -15 Goal status: INITIAL   4.  2MWT will increase to 450 ft  as indication of improved function Baseline: 233 ft Goal status: INITIAL   5. Decrease 5XSTS to 20 sec or less as indication of improved function Baseline: 31.7 sec Goal status: MET 02/16/2022 with 17.52 seconds and no use of UE   6.  Pt will complete commmunity ambulation s an AD tolerating distances of 1026f with a mild to no limp over the R LE. Baseline:  Goal status: INITIAL     PLAN: PT FREQUENCY: 2x/week   PT DURATION: 8 weeks   PLANNED INTERVENTIONS: Therapeutic exercises, Therapeutic activity, Neuromuscular re-education, Balance training, Gait training, Patient/Family education, Joint mobilization, Stair training, Dry Needling, Electrical stimulation, Cryotherapy, Moist heat, scar mobilization, Taping, Vasopneumatic device, Ultrasound, Ionotophoresis 454mml Dexamethasone, and Manual therapy   PLAN FOR NEXT SESSION: assess response to HEP,  progress therex and complete gait training   StEvelene CroonTA  04/06/22 5:38 PM

## 2022-04-07 NOTE — Therapy (Deleted)
OUTPATIENT PHYSICAL THERAPY TREATMENT NOTE   [name] MRN: [id number] DOB:03-08-1991, 31 y.o., female Today's Date: 04/07/2022  PCP: Patient, No Pcp Per REFERRING PROVIDER: Corinne Ports, PA-C  END OF SESSION:          Past Medical History:  Diagnosis Date   Anemia    Anxiety    Chlamydia 10/2011   Cholecystitis    Closed head injury with concussion    Gestational hypertension    Headache(784.0)    Heart palpitations    Hypertension    Past Surgical History:  Procedure Laterality Date   ORIF ANKLE FRACTURE Left 02/12/2016   Procedure: OPEN REDUCTION INTERNAL FIXATION (ORIF) ANKLE FRACTURE;  Surgeon: Altamese Launiupoko, MD;  Location: [place];  Service: Orthopedics;  Laterality: Left;   ORIF TIBIA PLATEAU Right 11/26/2021   Procedure: OPEN REDUCTION INTERNAL FIXATION (ORIF) TIBIAL PLATEAU;  Surgeon: [name] Needles, MD;  Location: [place];  Service: Orthopedics;  Laterality: Right;   Patient Active Problem List   Diagnosis Date Noted   Right medial tibial plateau fracture 11/24/2021   Fetal pericardial effusion affecting management of mother 08/11/2017   Pre-eclampsia 07/31/2017   Group B streptococcus urinary tract infection affecting pregnancy 04/24/2017   Encounter for supervision of normal pregnancy, unspecified, unspecified trimester 04/10/2017   History of gestational hypertension 04/10/2017   Short interval between pregnancies affecting pregnancy, antepartum 04/10/2017   Murmur, cardiac 02/10/2016    REFERRING DIAG: Right medial tibial plateau fracture  THERAPY DIAG:  No diagnosis found.   SUBJECTIVE:    SUBJECTIVE STATEMENT: Pt presents to PT with reports of continued R knee pain, though lessened today.   PAIN:  Are you having pain?  Yes: NPRS scale: 2/10 knee Pain location: lower back, knee Pain description: ache, throb Aggravating factors: Prolonged standing and walking 7/10 Relieving factors: Medications, moving it,  rest  PERTINENT HISTORY:  Obesity, L ankle fx    PRECAUTIONS: None   WEIGHT BEARING RESTRICTIONS  WBAT   FALLS:  Has patient fallen in last 6 months? Yes. Number of falls 1   LIVING ENVIRONMENT: Lives with: lives with their family Lives in: Mobile home No issues with accessing home or mobility within home   OCCUPATION: [employer]   PLOF: Independent   PATIENT GOALS To be back to normal as much as posible, to walk more normally     OBJECTIVE:      PATIENT SURVEYS:  FOTO 36%  Functional status   LE ROM:   Active ROM Right 01/14/2022 Left 01/14/2022 Right  02/03/22  Hip flexion       Hip extension       Hip abduction       Hip adduction       Hip internal rotation       Hip external rotation       Knee flexion 120 130 135  Knee extension 0 0   Ankle dorsiflexion       Ankle plantarflexion       Ankle inversion       Ankle eversion        (Blank rows = not tested) Ext lag R -15d, L -5d   LE MMT:   MMT Right 01/14/2022 Left 01/14/2022  Hip flexion 4 4+  Hip extension 4 5  Hip abduction 4+ 5  Hip adduction 5 5  Hip internal rotation 5 5  Hip external rotation 4 5  Knee flexion 4+ 5  Knee extension 4+ 5  Ankle dorsiflexion 5 5  Ankle plantarflexion 5 5  Ankle inversion      Ankle eversion       (Blank rows = not tested)   FUNCTIONAL TESTS:  5 times sit to stand: 31.7 s use of hands, decreased wt bearing R LE 02/16/2022: 17.52 with no UE use  2 minute walk test: 235 ft     GAIT: Distance walked: 110f Assistive device utilized: None Level of assistance: Complete Independence  Comments: antalgic gait on R        TODAY'S TREATMENT: [facility] Adult PT Treatment:                                                DATE: 04/06/2022 Therapeutic Exercise: Nustep level 6 x 5 mins LE only Slant board gastroc stretch x2' Knee ext machine - 5# - 3x10 - R only Knee flexion machine - 10# - 3x10 - R S/L leg press - 35# - 3x10 - R Wall squat 2x10  Neuromuscular  re-ed: Tandem stance x45" BIL Tandem stance on foam x45" BIL Romberg stance on foam EC x45" Slow marching on airex x10 BIL SLS x30" Rt   [facility] Adult PT Treatment:                                                DATE: 04/04/2022 Therapeutic Exercise: Nustep level 6 x 5 mins LE only STS 2x10 15# KB Slant board gastroc stretch x2' Knee ext machine - 5# - 3x10 - R Knee flexion machine - 10# - 3x10 - R S/L leg press - 35# - 3x10 - R Wall squat 2x10  Neuromuscular re-ed: Tandem stance x45" BIL Tandem stance on foam x45" BIL Romberg stance on foam EC x45"   [facility] Adult PT Treatment:                                                DATE: 03/22/2022 Therapeutic Exercise: Nustep level 5 x 5 mins LE only STS 2x10 10# KB Standing hip abd/ext 2x10 30# Knee ext machine - 5# - 3x10 - R Knee flexion machine - 10# - 2x10 - R S/L leg press - 35# - 3x10 - R Rt SLR 3x10 3# Wall squat 2x10    PATIENT EDUCATION:  Education details: HEP and POC update Person educated: Patient Education method: Explanation, Demonstration, Tactile cues, Verbal cues, and Handouts Education comprehension: verbalized understanding, returned demonstration, verbal cues required, and tactile cues required     HOME EXERCISE PROGRAM: Access Code: [email or web address] URL: [email or web address] Date: 03/02/2022 Prepared by: KShearon Balo Exercises - Active Straight Leg Raise with Quad Set  - 1 x daily - 7 x weekly - 2 sets - 10 reps - 3 hold - Sidelying Hip Abduction  - 1 x daily - 7 x weekly - 2 sets - 10 reps - 3 hold - Sit to Stand Without Arm Support  - 1 x daily - 7 x weekly - 2 sets - 10 reps - 3 hold - Seated Knee Extension with Resistance  - 2 x daily - 7 x weekly -  3 sets - 10 reps  ASSESSMENT:   CLINICAL IMPRESSION: Patient presents to PT with decreased overall pain this session. Session today continued to focus on LE and core strengthening as well as balance tasks to improve proprioception. Her  balance is improving as demonstrated by increased SLS stance on Rt with no increase in knee pain. Patient was able to tolerate all prescribed exercises with no adverse effects. Patient continues to benefit from skilled PT services and should be progressed as able to improve functional independence.   OBJECTIVE IMPAIRMENTS Abnormal gait, decreased activity tolerance, difficulty walking, decreased ROM, decreased strength, increased edema, obesity, and pain.     ACTIVITY LIMITATIONS cleaning, community activity, driving, meal prep, occupation, yard work, and shopping.    PERSONAL FACTORS 1 comorbidity: obesity  are also affecting patient's functional outcome.    GOALS:   SHORT TERM GOALS: Target date: 02/04/2022   Pt will be Ind in an initial HEP Baseline: started on eval Goal status: MET 02/03/22   2.  Gait will progress to pt walking c 1 crutch or SPC the majority of the time Baseline: crutches Status: no AD, min limp Goal status: MET 02/03/22   LONG TERM GOALS: Target date: 05/03/2022   Increase R hip strength to at least 4+/5 and R knee strength to 5/5 for improved function of the R LE Baseline: see flow sheet Goal status: INITIAL   2.  Increase R knee flexion ROM to 125d for improved R knee function  Baseline: see flow sheet Goal status: MET 02/03/22   3.  R knee ext lag c SAQ will increase to -5 for improved R LE function Baseline: -15 Goal status: INITIAL   4.  2MWT will increase to 450 ft as indication of improved function Baseline: 233 ft Goal status: INITIAL   5. Decrease 5XSTS to 20 sec or less as indication of improved function Baseline: 31.7 sec Goal status: MET 02/16/2022 with 17.52 seconds and no use of UE   6.  Pt will complete commmunity ambulation s an AD tolerating distances of 1042f with a mild to no limp over the R LE. Baseline:  Goal status: INITIAL     PLAN: PT FREQUENCY: 2x/week   PT DURATION: 8 weeks   PLANNED INTERVENTIONS: Therapeutic exercises,  Therapeutic activity, Neuromuscular re-education, Balance training, Gait training, Patient/Family education, Joint mobilization, Stair training, Dry Needling, Electrical stimulation, Cryotherapy, Moist heat, scar mobilization, Taping, Vasopneumatic device, Ultrasound, Ionotophoresis 478mml Dexamethasone, and Manual therapy   PLAN FOR NEXT SESSION: assess response to HEP,  progress therex and complete gait training   SiTruman Haywarduds PT  04/07/22 9:00 AM

## 2022-04-11 ENCOUNTER — Ambulatory Visit: Payer: 59 | Admitting: Physical Therapy

## 2022-04-13 ENCOUNTER — Ambulatory Visit: Payer: 59

## 2022-04-13 DIAGNOSIS — R262 Difficulty in walking, not elsewhere classified: Secondary | ICD-10-CM

## 2022-04-13 DIAGNOSIS — M6281 Muscle weakness (generalized): Secondary | ICD-10-CM

## 2022-04-13 NOTE — Therapy (Addendum)
OUTPATIENT PHYSICAL THERAPY TREATMENT NOTE   [name] MRN: [id number] DOB:[birth year], 31 y.o., female Today's Date: 04/13/2022  PCP: Patient, No Pcp Per REFERRING PROVIDER: Corinne Ports, PA-C  END OF SESSION:   PT End of Session - 04/13/22 1441     Visit Number 14    Number of Visits 22    Date for PT Re-Evaluation 05/03/22    Authorization Type [employer] OTHER;[place] MEDICAID [employer]    Progress Note Due on Visit 10    PT Start Time 1445    PT Stop Time 1523    PT Time Calculation (min) 38 min    Activity Tolerance Patient tolerated treatment well    Behavior During Therapy [facility] for tasks assessed/performed                   Past Medical History:  Diagnosis Date   Anemia    Anxiety    Chlamydia 10/2011   Cholecystitis    Closed head injury with concussion    Gestational hypertension    Headache(784.0)    Heart palpitations    Hypertension    Past Surgical History:  Procedure Laterality Date   ORIF ANKLE FRACTURE Left 02/12/2016   Procedure: OPEN REDUCTION INTERNAL FIXATION (ORIF) ANKLE FRACTURE;  Surgeon: Altamese Nesika Beach, MD;  Location: [place];  Service: Orthopedics;  Laterality: Left;   ORIF TIBIA PLATEAU Right 11/26/2021   Procedure: OPEN REDUCTION INTERNAL FIXATION (ORIF) TIBIAL PLATEAU;  Surgeon: [name] Needles, MD;  Location: [place];  Service: Orthopedics;  Laterality: Right;   Patient Active Problem List   Diagnosis Date Noted   Right medial tibial plateau fracture 11/24/2021   Fetal pericardial effusion affecting management of mother 08/11/2017   Pre-eclampsia 07/31/2017   Group B streptococcus urinary tract infection affecting pregnancy 04/24/2017   Encounter for supervision of normal pregnancy, unspecified, unspecified trimester 04/10/2017   History of gestational hypertension 04/10/2017   Short interval between pregnancies affecting pregnancy, antepartum 04/10/2017   Murmur, cardiac 02/10/2016     REFERRING DIAG: Right medial tibial plateau fracture  THERAPY DIAG:  Muscle weakness (generalized)  Difficulty in walking, not elsewhere classified   SUBJECTIVE:    SUBJECTIVE STATEMENT: Pt presents to PT with no current reports of pain. HEP is going well at home with no adverse effect. She is ready to begin PT at this time.   PAIN:  Are you having pain?  No: NPRS scale: 0/10 knee Pain location: lower back, knee Pain description: ache, throb Aggravating factors: Prolonged standing and walking 7/10 Relieving factors: Medications, moving it, rest  PERTINENT HISTORY:  Obesity, L ankle fx    PRECAUTIONS: None   WEIGHT BEARING RESTRICTIONS  WBAT   FALLS:  Has patient fallen in last 6 months? Yes. Number of falls 1   LIVING ENVIRONMENT: Lives with: lives with their family Lives in: Mobile home No issues with accessing home or mobility within home   OCCUPATION: [employer]   PLOF: Independent   PATIENT GOALS To be back to normal as much as posible, to walk more normally     OBJECTIVE:      PATIENT SURVEYS:  FOTO 36%  Functional status   LE ROM:   Active ROM Right 01/14/2022 Left 01/14/2022 Right  02/03/22  Hip flexion       Hip extension       Hip abduction       Hip adduction       Hip internal rotation  Hip external rotation       Knee flexion 120 130 135  Knee extension 0 0   Ankle dorsiflexion       Ankle plantarflexion       Ankle inversion       Ankle eversion        (Blank rows = not tested) Ext lag R -15d, L -5d   LE MMT:   MMT Right 01/14/2022 Left 01/14/2022  Hip flexion 4 4+  Hip extension 4 5  Hip abduction 4+ 5  Hip adduction 5 5  Hip internal rotation 5 5  Hip external rotation 4 5  Knee flexion 4+ 5  Knee extension 4+ 5  Ankle dorsiflexion 5 5  Ankle plantarflexion 5 5  Ankle inversion      Ankle eversion       (Blank rows = not tested)   FUNCTIONAL TESTS:  5 times sit to stand: 31.7 s use of hands, decreased  wt bearing R LE 02/16/2022: 17.52 with no UE use  2 minute walk test: 235 ft     GAIT: Distance walked: 3f Assistive device utilized: None Level of assistance: Complete Independence  Comments: antalgic gait on R        TODAY'S TREATMENT: [facility] Adult PT Treatment:                                                DATE: 04/13/2022 Therapeutic Exercise: Nustep level 7 x 4 mins LE only Lateral walk RTB 2x171fMonster walk RTB x 1552flant board gastroc stretch x60" Knee ext machine - 10# - 2x10 - R only Knee flexion machine - 15# - 3x10 - R Wall squat 2x10  Standing hip abd/ext x 10 ea 30# Neuromuscular re-ed: Tandem stance x45" ea SLS on foam 2x30" ea Romberg stance on foam EC x 30"  [facility] Adult PT Treatment:                                                DATE: 04/06/2022 Therapeutic Exercise: Nustep level 6 x 5 mins LE only Slant board gastroc stretch x2' Knee ext machine - 5# - 3x10 - R only Knee flexion machine - 10# - 3x10 - R S/L leg press - 35# - 3x10 - R Wall squat 2x10  Neuromuscular re-ed: Tandem stance x45" BIL Tandem stance on foam x45" BIL Romberg stance on foam EC x45" Slow marching on airex x10 BIL SLS x30" Rt  [facility] Adult PT Treatment:                                                DATE: 04/04/2022 Therapeutic Exercise: Nustep level 6 x 5 mins LE only STS 2x10 15# KB Slant board gastroc stretch x2' Knee ext machine - 5# - 3x10 - R Knee flexion machine - 10# - 3x10 - R S/L leg press - 35# - 3x10 - R Wall squat 2x10  Neuromuscular re-ed: Tandem stance x45" BIL Tandem stance on foam x45" BIL Romberg stance on foam EC x45"   [facility] Adult PT Treatment:  DATE: 03/22/2022 Therapeutic Exercise: Nustep level 5 x 5 mins LE only STS 2x10 10# KB Standing hip abd/ext 2x10 30# Knee ext machine - 5# - 3x10 - R Knee flexion machine - 10# - 2x10 - R S/L leg press - 35# - 3x10 - R Rt SLR 3x10 3# Wall squat 2x10     PATIENT EDUCATION:  Education details: HEP and POC update Person educated: Patient Education method: Explanation, Demonstration, Tactile cues, Verbal cues, and Handouts Education comprehension: verbalized understanding, returned demonstration, verbal cues required, and tactile cues required     HOME EXERCISE PROGRAM: Access Code: [email or web address] URL: [email or web address] Date: 03/02/2022 Prepared by: Shearon Balo  Exercises - Active Straight Leg Raise with Quad Set  - 1 x daily - 7 x weekly - 2 sets - 10 reps - 3 hold - Sidelying Hip Abduction  - 1 x daily - 7 x weekly - 2 sets - 10 reps - 3 hold - Sit to Stand Without Arm Support  - 1 x daily - 7 x weekly - 2 sets - 10 reps - 3 hold - Seated Knee Extension with Resistance  - 2 x daily - 7 x weekly - 3 sets - 10 reps  ASSESSMENT:   CLINICAL IMPRESSION: Pt was able to complete all prescribed exercises with no adverse effect or increase in pain. Therapy focused on improving proximal hip and quad strength. She is progressing well with therapy, showing continued improvement in strength and activity tolerance along with decrease in R knee pain. Will continue to progress as able per POC.   OBJECTIVE IMPAIRMENTS Abnormal gait, decreased activity tolerance, difficulty walking, decreased ROM, decreased strength, increased edema, obesity, and pain.     ACTIVITY LIMITATIONS cleaning, community activity, driving, meal prep, occupation, yard work, and shopping.    PERSONAL FACTORS 1 comorbidity: obesity  are also affecting patient's functional outcome.    GOALS:   SHORT TERM GOALS: Target date: 02/04/2022   Pt will be Ind in an initial HEP Baseline: started on eval Goal status: MET 02/03/22   2.  Gait will progress to pt walking c 1 crutch or SPC the majority of the time Baseline: crutches Status: no AD, min limp Goal status: MET 02/03/22   LONG TERM GOALS: Target date: 05/03/2022   Increase R hip strength to at least 4+/5  and R knee strength to 5/5 for improved function of the R LE Baseline: see flow sheet Goal status: INITIAL   2.  Increase R knee flexion ROM to 125d for improved R knee function  Baseline: see flow sheet Goal status: MET 02/03/22   3.  R knee ext lag c SAQ will increase to -5 for improved R LE function Baseline: -15 Goal status: INITIAL   4.  2MWT will increase to 450 ft as indication of improved function Baseline: 233 ft Goal status: INITIAL   5. Decrease 5XSTS to 20 sec or less as indication of improved function Baseline: 31.7 sec Goal status: MET 02/16/2022 with 17.52 seconds and no use of UE   6.  Pt will complete commmunity ambulation s an AD tolerating distances of 1058f with a mild to no limp over the R LE. Baseline:  Goal status: INITIAL     PLAN: PT FREQUENCY: 2x/week   PT DURATION: 8 weeks   PLANNED INTERVENTIONS: Therapeutic exercises, Therapeutic activity, Neuromuscular re-education, Balance training, Gait training, Patient/Family education, Joint mobilization, Stair training, Dry Needling, Electrical stimulation, Cryotherapy, Moist heat, scar mobilization, Taping, Vasopneumatic device, Ultrasound, Ionotophoresis 442mml  Dexamethasone, and Manual therapy   PLAN FOR NEXT SESSION: assess response to HEP,  progress therex and complete gait training   Ward Chatters PT  04/13/22 3:23 PM

## 2022-04-18 ENCOUNTER — Ambulatory Visit: Payer: 59

## 2022-04-18 DIAGNOSIS — M6281 Muscle weakness (generalized): Secondary | ICD-10-CM

## 2022-04-18 DIAGNOSIS — R262 Difficulty in walking, not elsewhere classified: Secondary | ICD-10-CM

## 2022-04-18 NOTE — Therapy (Signed)
OUTPATIENT PHYSICAL THERAPY TREATMENT NOTE   [name] MRN: [id number] DOB:08/31/1991, 31 y.o., female Today's Date: 04/18/2022  PCP: Patient, No Pcp Per REFERRING PROVIDER: Corinne Ports, PA-C  END OF SESSION:   PT End of Session - 04/18/22 1525     Visit Number 15    Number of Visits 22    Date for PT Re-Evaluation 05/03/22    Authorization Type [employer] OTHER;[place] MEDICAID [employer]    Progress Note Due on Visit 10    PT Start Time 1530    PT Stop Time 1610    PT Time Calculation (min) 40 min    Activity Tolerance Patient tolerated treatment well    Behavior During Therapy [facility] for tasks assessed/performed                    Past Medical History:  Diagnosis Date   Anemia    Anxiety    Chlamydia 10/2011   Cholecystitis    Closed head injury with concussion    Gestational hypertension    Headache(784.0)    Heart palpitations    Hypertension    Past Surgical History:  Procedure Laterality Date   ORIF ANKLE FRACTURE Left 02/12/2016   Procedure: OPEN REDUCTION INTERNAL FIXATION (ORIF) ANKLE FRACTURE;  Surgeon: Altamese [place], MD;  Location: [place];  Service: Orthopedics;  Laterality: Left;   ORIF TIBIA PLATEAU Right 11/26/2021   Procedure: OPEN REDUCTION INTERNAL FIXATION (ORIF) TIBIAL PLATEAU;  Surgeon: [name] Needles, MD;  Location: [place];  Service: Orthopedics;  Laterality: Right;   Patient Active Problem List   Diagnosis Date Noted   Right medial tibial plateau fracture 11/24/2021   Fetal pericardial effusion affecting management of mother 08/11/2017   Pre-eclampsia 07/31/2017   Group B streptococcus urinary tract infection affecting pregnancy 04/24/2017   Encounter for supervision of normal pregnancy, unspecified, unspecified trimester 04/10/2017   History of gestational hypertension 04/10/2017   Short interval between pregnancies affecting pregnancy, antepartum 04/10/2017   Murmur, cardiac 02/10/2016     REFERRING DIAG: Right medial tibial plateau fracture  THERAPY DIAG:  Muscle weakness (generalized)  Difficulty in walking, not elsewhere classified   SUBJECTIVE:    SUBJECTIVE STATEMENT: Pt presents to PT with reports of bilateral hip muscle soreness, no knee. Pt has been compliant with HEP with no adverse effect. Pt is ready to begin PT at this time.   PAIN:  Are you having pain?  No: NPRS scale: 0/10 knee Pain location: lower back, knee Pain description: ache, throb Aggravating factors: Prolonged standing and walking 7/10 Relieving factors: Medications, moving it, rest  PERTINENT HISTORY:  Obesity, L ankle fx    PRECAUTIONS: None   WEIGHT BEARING RESTRICTIONS  WBAT   FALLS:  Has patient fallen in last 6 months? Yes. Number of falls 1   LIVING ENVIRONMENT: Lives with: lives with their family Lives in: Mobile home No issues with accessing home or mobility within home   OCCUPATION: [employer]   PLOF: Independent   PATIENT GOALS To be back to normal as much as posible, to walk more normally     OBJECTIVE:      PATIENT SURVEYS:  FOTO 36%  Functional status   LE ROM:   Active ROM Right 01/14/2022 Left 01/14/2022 Right  02/03/22  Hip flexion       Hip extension       Hip abduction       Hip adduction  Hip internal rotation       Hip external rotation       Knee flexion 120 130 135  Knee extension 0 0   Ankle dorsiflexion       Ankle plantarflexion       Ankle inversion       Ankle eversion        (Blank rows = not tested) Ext lag R -15d, L -5d   LE MMT:   MMT Right 01/14/2022 Left 01/14/2022  Hip flexion 4 4+  Hip extension 4 5  Hip abduction 4+ 5  Hip adduction 5 5  Hip internal rotation 5 5  Hip external rotation 4 5  Knee flexion 4+ 5  Knee extension 4+ 5  Ankle dorsiflexion 5 5  Ankle plantarflexion 5 5  Ankle inversion      Ankle eversion       (Blank rows = not tested)   FUNCTIONAL TESTS:  5 times sit to stand: 31.7  s use of hands, decreased wt bearing R LE 02/16/2022: 17.52 with no UE use  2 minute walk test: 235 ft     TODAY'S TREATMENT: [facility] Adult PT Treatment:                                                DATE: 04/18/2022 Therapeutic Exercise: Nustep level 7 x 4 mins LE only while taking subjective Lateral walk RTB 3x72f Monster walk RTB 2x117fSlant board gastroc stretch x60" Knee ext machine - 10# - 2x10 - R only Knee flexion machine - 15# - 3x10 - R Wall squat 2x10 Step down 2x10 6in Standing hip abd 2x10 ea 30# Neuromuscular re-ed: SLS on foam 2x45" ea  [facility] Adult PT Treatment:                                                DATE: 04/13/2022 Therapeutic Exercise: Nustep level 7 x 4 mins LE only Lateral walk RTB 2x1536fonster walk RTB x 73f66fant board gastroc stretch x60" Knee ext machine - 10# - 2x10 - R only Knee flexion machine - 15# - 3x10 - R Wall squat 2x10  Standing hip abd/ext x 10 ea 30# Neuromuscular re-ed: Tandem stance x45" ea SLS on foam 2x30" ea Romberg stance on foam EC x 30"  [facility] Adult PT Treatment:                                                DATE: 04/06/2022 Therapeutic Exercise: Nustep level 6 x 5 mins LE only Slant board gastroc stretch x2' Knee ext machine - 5# - 3x10 - R only Knee flexion machine - 10# - 3x10 - R S/L leg press - 35# - 3x10 - R Wall squat 2x10  Neuromuscular re-ed: Tandem stance x45" BIL Tandem stance on foam x45" BIL Romberg stance on foam EC x45" Slow marching on airex x10 BIL SLS x30" Rt  [facility] Adult PT Treatment:  DATE: 04/04/2022 Therapeutic Exercise: Nustep level 6 x 5 mins LE only STS 2x10 15# KB Slant board gastroc stretch x2' Knee ext machine - 5# - 3x10 - R Knee flexion machine - 10# - 3x10 - R S/L leg press - 35# - 3x10 - R Wall squat 2x10  Neuromuscular re-ed: Tandem stance x45" BIL Tandem stance on foam x45" BIL Romberg stance on foam EC x45"  PATIENT EDUCATION:   Education details: continue HEP Person educated: Patient Education method: Explanation, Demonstration, Tactile cues, Verbal cues, and Handouts Education comprehension: verbalized understanding, returned demonstration, verbal cues required, and tactile cues required     HOME EXERCISE PROGRAM: Access Code: [email or web address] URL: [email or web address] Date: 03/02/2022 Prepared by: Shearon Balo  Exercises - Active Straight Leg Raise with Quad Set  - 1 x daily - 7 x weekly - 2 sets - 10 reps - 3 hold - Sidelying Hip Abduction  - 1 x daily - 7 x weekly - 2 sets - 10 reps - 3 hold - Sit to Stand Without Arm Support  - 1 x daily - 7 x weekly - 2 sets - 10 reps - 3 hold - Seated Knee Extension with Resistance  - 2 x daily - 7 x weekly - 3 sets - 10 reps  ASSESSMENT:   CLINICAL IMPRESSION: Pt was able to complete all prescribed exercises with no adverse effect or increase in pain. Therapy focused on improving proximal hip and quad strength in order to decrease pain and improve gait. She continues to progress very well with therapy and will continue to be seen and progressed as able per POC.    OBJECTIVE IMPAIRMENTS Abnormal gait, decreased activity tolerance, difficulty walking, decreased ROM, decreased strength, increased edema, obesity, and pain.     ACTIVITY LIMITATIONS cleaning, community activity, driving, meal prep, occupation, yard work, and shopping.    PERSONAL FACTORS 1 comorbidity: obesity  are also affecting patient's functional outcome.    GOALS:   SHORT TERM GOALS: Target date: 02/04/2022   Pt will be Ind in an initial HEP Baseline: started on eval Goal status: MET 02/03/22   2.  Gait will progress to pt walking c 1 crutch or SPC the majority of the time Baseline: crutches Status: no AD, min limp Goal status: MET 02/03/22   LONG TERM GOALS: Target date: 05/03/2022   Increase R hip strength to at least 4+/5 and R knee strength to 5/5 for improved function of the R  LE Baseline: see flow sheet Goal status: INITIAL   2.  Increase R knee flexion ROM to 125d for improved R knee function  Baseline: see flow sheet Goal status: MET 02/03/22   3.  R knee ext lag c SAQ will increase to -5 for improved R LE function Baseline: -15 Goal status: INITIAL   4.  2MWT will increase to 450 ft as indication of improved function Baseline: 233 ft Goal status: INITIAL   5. Decrease 5XSTS to 20 sec or less as indication of improved function Baseline: 31.7 sec Goal status: MET 02/16/2022 with 17.52 seconds and no use of UE   6.  Pt will complete commmunity ambulation s an AD tolerating distances of 1088f with a mild to no limp over the R LE. Baseline:  Goal status: INITIAL     PLAN: PT FREQUENCY: 2x/week   PT DURATION: 8 weeks   PLANNED INTERVENTIONS: Therapeutic exercises, Therapeutic activity, Neuromuscular re-education, Balance training, Gait training, Patient/Family education, Joint mobilization, Stair training, Dry Needling, Electrical stimulation,  Cryotherapy, Moist heat, scar mobilization, Taping, Vasopneumatic device, Ultrasound, Ionotophoresis 52m/ml Dexamethasone, and Manual therapy   PLAN FOR NEXT SESSION: assess response to HEP,  progress therex and complete gait training   DWard ChattersPT  04/18/22 4:10 PM

## 2022-04-20 ENCOUNTER — Ambulatory Visit: Payer: 59

## 2022-04-20 DIAGNOSIS — M6281 Muscle weakness (generalized): Secondary | ICD-10-CM | POA: Diagnosis not present

## 2022-04-20 DIAGNOSIS — R262 Difficulty in walking, not elsewhere classified: Secondary | ICD-10-CM

## 2022-04-20 NOTE — Therapy (Signed)
OUTPATIENT PHYSICAL THERAPY TREATMENT NOTE   [name] MRN: [id number] DOB:January 15, 1991, 31 y.o., female Today's Date: 04/20/2022  PCP: Patient, No Pcp Per REFERRING PROVIDER: Corinne Ports, PA-C  END OF SESSION:   PT End of Session - 04/20/22 1518     Visit Number 16    Number of Visits 22    Date for PT Re-Evaluation 05/03/22    Authorization Type [employer] OTHER;[place] MEDICAID [employer]    Progress Note Due on Visit 10    PT Start Time 1530    PT Stop Time 1610    PT Time Calculation (min) 40 min    Activity Tolerance Patient tolerated treatment well    Behavior During Therapy [facility] for tasks assessed/performed                     Past Medical History:  Diagnosis Date   Anemia    Anxiety    Chlamydia 10/2011   Cholecystitis    Closed head injury with concussion    Gestational hypertension    Headache(784.0)    Heart palpitations    Hypertension    Past Surgical History:  Procedure Laterality Date   ORIF ANKLE FRACTURE Left 02/12/2016   Procedure: OPEN REDUCTION INTERNAL FIXATION (ORIF) ANKLE FRACTURE;  Surgeon: Altamese [place], MD;  Location: [place];  Service: Orthopedics;  Laterality: Left;   ORIF TIBIA PLATEAU Right 11/26/2021   Procedure: OPEN REDUCTION INTERNAL FIXATION (ORIF) TIBIAL PLATEAU;  Surgeon: [name] Needles, MD;  Location: [place];  Service: Orthopedics;  Laterality: Right;   Patient Active Problem List   Diagnosis Date Noted   Right medial tibial plateau fracture 11/24/2021   Fetal pericardial effusion affecting management of mother 08/11/2017   Pre-eclampsia 07/31/2017   Group B streptococcus urinary tract infection affecting pregnancy 04/24/2017   Encounter for supervision of normal pregnancy, unspecified, unspecified trimester 04/10/2017   History of gestational hypertension 04/10/2017   Short interval between pregnancies affecting pregnancy, antepartum 04/10/2017   Murmur, cardiac 02/10/2016     REFERRING DIAG: Right medial tibial plateau fracture  THERAPY DIAG:  Muscle weakness (generalized)  Difficulty in walking, not elsewhere classified   SUBJECTIVE:    SUBJECTIVE STATEMENT: Pt presents to PT with no current reports of pain or discomfort. Has been compliant  PAIN:  Are you having pain?  No: NPRS scale: 0/10 knee Pain location: lower back, knee Pain description: ache, throb Aggravating factors: Prolonged standing and walking 7/10 Relieving factors: Medications, moving it, rest  PERTINENT HISTORY:  Obesity, L ankle fx    PRECAUTIONS: None   WEIGHT BEARING RESTRICTIONS  WBAT    OBJECTIVE:     PATIENT SURVEYS:  FOTO 36%  Functional status   LE ROM:   Active ROM Right 01/14/2022 Left 01/14/2022 Right  02/03/22  Hip flexion       Hip extension       Hip abduction       Hip adduction       Hip internal rotation       Hip external rotation       Knee flexion 120 130 135  Knee extension 0 0   Ankle dorsiflexion       Ankle plantarflexion       Ankle inversion       Ankle eversion        (Blank rows = not tested) Ext lag R -15d, L -5d   LE MMT:   MMT Right 01/14/2022 Left  01/14/2022  Hip flexion 4 4+  Hip extension 4 5  Hip abduction 4+ 5  Hip adduction 5 5  Hip internal rotation 5 5  Hip external rotation 4 5  Knee flexion 4+ 5  Knee extension 4+ 5  Ankle dorsiflexion 5 5  Ankle plantarflexion 5 5  Ankle inversion      Ankle eversion       (Blank rows = not tested)   FUNCTIONAL TESTS:  5 times sit to stand: 31.7 s use of hands, decreased wt bearing R LE 02/16/2022: 17.52 with no UE use  2 minute walk test: 235 ft     TODAY'S TREATMENT: [facility] Adult PT Treatment:                                                DATE: 04/20/2022 Therapeutic Exercise: Nustep level 7 x 3 mins LE only while taking subjective Supine SLR 2x10 2.5# S/L hip abd 2x10 2.5#  STS 2x15 10# KB Lateral walk RTB 3x10f Monster walk RTB 2x116fWalll squat with  physioball 2x10  Slant board gastroc stretch x60" Wall squat 2x10 Step down 2x10 6in Lunges x 1510fNeuromuscular re-ed: SLS on foam x 45" ea  [facility] Adult PT Treatment:                                                DATE: 04/18/2022 Therapeutic Exercise: Nustep level 7 x 4 mins LE only while taking subjective Lateral walk RTB 3x15f51fnster walk RTB 2x15ft63fnt board gastroc stretch x60" Knee ext machine - 10# - 2x10 - R only Knee flexion machine - 15# - 3x10 - R Wall squat 2x10 Step down 2x10 6in Standing hip abd 2x10 ea 30# Neuromuscular re-ed: SLS on foam 2x45" ea  [facility] Adult PT Treatment:                                                DATE: 04/13/2022 Therapeutic Exercise: Nustep level 7 x 4 mins LE only Lateral walk RTB 2x15ft 3fter walk RTB x 15ft S28f board gastroc stretch x60" Knee ext machine - 10# - 2x10 - R only Knee flexion machine - 15# - 3x10 - R Wall squat 2x10  Standing hip abd/ext x 10 ea 30# Neuromuscular re-ed: Tandem stance x45" ea SLS on foam 2x30" ea Romberg stance on foam EC x 30"  PATIENT EDUCATION:  Education details: continue HEP Person educated: Patient Education method: Explanation, Demonstration, Tactile cues, Verbal cues, and Handouts Education comprehension: verbalized understanding, returned demonstration, verbal cues required, and tactile cues required     HOME EXERCISE PROGRAM: Access Code: [email or web address] URL: [email or web address] Date: 03/02/2022 Prepared by: Karl ReShearon Baloises - Active Straight Leg Raise with Quad Set  - 1 x daily - 7 x weekly - 2 sets - 10 reps - 3 hold - Sidelying Hip Abduction  - 1 x daily - 7 x weekly - 2 sets - 10 reps - 3 hold - Sit to Stand Without Arm Support  - 1 x daily - 7 x weekly - 2 sets - 10  reps - 3 hold - Seated Knee Extension with Resistance  - 2 x daily - 7 x weekly - 3 sets - 10 reps  ASSESSMENT:   CLINICAL IMPRESSION: Pt was able to complete all prescribed exercises  with no adverse effect or increase in pain. Therapy continued to progress quad and proximal hip strength in order to decrease pain and improve functional ability. She continues to benefit from skilled PT services and will continue to be seen and progressed as able per POC.   OBJECTIVE IMPAIRMENTS Abnormal gait, decreased activity tolerance, difficulty walking, decreased ROM, decreased strength, increased edema, obesity, and pain.     ACTIVITY LIMITATIONS cleaning, community activity, driving, meal prep, occupation, yard work, and shopping.    PERSONAL FACTORS 1 comorbidity: obesity  are also affecting patient's functional outcome.    GOALS:   SHORT TERM GOALS: Target date: 02/04/2022   Pt will be Ind in an initial HEP Baseline: started on eval Goal status: MET 02/03/22   2.  Gait will progress to pt walking c 1 crutch or SPC the majority of the time Baseline: crutches Status: no AD, min limp Goal status: MET 02/03/22   LONG TERM GOALS: Target date: 05/03/2022   Increase R hip strength to at least 4+/5 and R knee strength to 5/5 for improved function of the R LE Baseline: see flow sheet Goal status: INITIAL   2.  Increase R knee flexion ROM to 125d for improved R knee function  Baseline: see flow sheet Goal status: MET 02/03/22   3.  R knee ext lag c SAQ will increase to -5 for improved R LE function Baseline: -15 Goal status: INITIAL   4.  2MWT will increase to 450 ft as indication of improved function Baseline: 233 ft Goal status: INITIAL   5. Decrease 5XSTS to 20 sec or less as indication of improved function Baseline: 31.7 sec Goal status: MET 02/16/2022 with 17.52 seconds and no use of UE   6.  Pt will complete commmunity ambulation s an AD tolerating distances of 1073f with a mild to no limp over the R LE. Baseline:  Goal status: INITIAL     PLAN: PT FREQUENCY: 2x/week   PT DURATION: 8 weeks   PLANNED INTERVENTIONS: Therapeutic exercises, Therapeutic activity,  Neuromuscular re-education, Balance training, Gait training, Patient/Family education, Joint mobilization, Stair training, Dry Needling, Electrical stimulation, Cryotherapy, Moist heat, scar mobilization, Taping, Vasopneumatic device, Ultrasound, Ionotophoresis 433mml Dexamethasone, and Manual therapy   PLAN FOR NEXT SESSION: assess response to HEP,  progress therex and complete gait training   DaWard ChattersT  04/20/22 4:11 PM

## 2022-04-21 NOTE — Therapy (Deleted)
OUTPATIENT PHYSICAL THERAPY TREATMENT NOTE   [name] MRN: [id number] DOB:08/21/1991, 31 y.o., female Today's Date: 04/21/2022  PCP: Patient, No Pcp Per REFERRING PROVIDER: Corinne Ports, PA-C  END OF SESSION:             Past Medical History:  Diagnosis Date   Anemia    Anxiety    Chlamydia 10/2011   Cholecystitis    Closed head injury with concussion    Gestational hypertension    Headache(784.0)    Heart palpitations    Hypertension    Past Surgical History:  Procedure Laterality Date   ORIF ANKLE FRACTURE Left 02/12/2016   Procedure: OPEN REDUCTION INTERNAL FIXATION (ORIF) ANKLE FRACTURE;  Surgeon: Altamese Meredosia, MD;  Location: [place];  Service: Orthopedics;  Laterality: Left;   ORIF TIBIA PLATEAU Right 11/26/2021   Procedure: OPEN REDUCTION INTERNAL FIXATION (ORIF) TIBIAL PLATEAU;  Surgeon: [name] Needles, MD;  Location: [place];  Service: Orthopedics;  Laterality: Right;   Patient Active Problem List   Diagnosis Date Noted   Right medial tibial plateau fracture 11/24/2021   Fetal pericardial effusion affecting management of mother 08/11/2017   Pre-eclampsia 07/31/2017   Group B streptococcus urinary tract infection affecting pregnancy 04/24/2017   Encounter for supervision of normal pregnancy, unspecified, unspecified trimester 04/10/2017   History of gestational hypertension 04/10/2017   Short interval between pregnancies affecting pregnancy, antepartum 04/10/2017   Murmur, cardiac 02/10/2016    REFERRING DIAG: Right medial tibial plateau fracture  THERAPY DIAG:  No diagnosis found.   SUBJECTIVE:    SUBJECTIVE STATEMENT: Pt presents to PT with no current reports of pain or discomfort. Has been compliant  PAIN:  Are you having pain?  No: NPRS scale: 0/10 knee Pain location: lower back, knee Pain description: ache, throb Aggravating factors: Prolonged standing and walking 7/10 Relieving factors: Medications, moving it,  rest  PERTINENT HISTORY:  Obesity, L ankle fx    PRECAUTIONS: None   WEIGHT BEARING RESTRICTIONS  WBAT    OBJECTIVE:     PATIENT SURVEYS:  FOTO 36%  Functional status   LE ROM:   Active ROM Right 01/14/2022 Left 01/14/2022 Right  02/03/22  Hip flexion       Hip extension       Hip abduction       Hip adduction       Hip internal rotation       Hip external rotation       Knee flexion 120 130 135  Knee extension 0 0   Ankle dorsiflexion       Ankle plantarflexion       Ankle inversion       Ankle eversion        (Blank rows = not tested) Ext lag R -15d, L -5d   LE MMT:   MMT Right 01/14/2022 Left 01/14/2022  Hip flexion 4 4+  Hip extension 4 5  Hip abduction 4+ 5  Hip adduction 5 5  Hip internal rotation 5 5  Hip external rotation 4 5  Knee flexion 4+ 5  Knee extension 4+ 5  Ankle dorsiflexion 5 5  Ankle plantarflexion 5 5  Ankle inversion      Ankle eversion       (Blank rows = not tested)   FUNCTIONAL TESTS:  5 times sit to stand: 31.7 s use of hands, decreased wt bearing R LE 02/16/2022: 17.52 with no UE use  2 minute walk test: 235 ft  TODAY'S TREATMENT: [facility] Adult PT Treatment:                                                DATE: 04/20/2022 Therapeutic Exercise: Nustep level 7 x 3 mins LE only while taking subjective Supine SLR 2x10 2.5# S/L hip abd 2x10 2.5#  STS 2x15 10# KB Lateral walk RTB 3x72f Monster walk RTB 2x172fWalll squat with physioball 2x10  Slant board gastroc stretch x60" Wall squat 2x10 Step down 2x10 6in Lunges x 1566fNeuromuscular re-ed: SLS on foam x 45" ea  [facility] Adult PT Treatment:                                                DATE: 04/18/2022 Therapeutic Exercise: Nustep level 7 x 4 mins LE only while taking subjective Lateral walk RTB 3x15f33fnster walk RTB 2x15ft4fnt board gastroc stretch x60" Knee ext machine - 10# - 2x10 - R only Knee flexion machine - 15# - 3x10 - R Wall squat 2x10 Step down 2x10  6in Standing hip abd 2x10 ea 30# Neuromuscular re-ed: SLS on foam 2x45" ea  [facility] Adult PT Treatment:                                                DATE: 04/13/2022 Therapeutic Exercise: Nustep level 7 x 4 mins LE only Lateral walk RTB 2x15ft 86fter walk RTB x 15ft S60f board gastroc stretch x60" Knee ext machine - 10# - 2x10 - R only Knee flexion machine - 15# - 3x10 - R Wall squat 2x10  Standing hip abd/ext x 10 ea 30# Neuromuscular re-ed: Tandem stance x45" ea SLS on foam 2x30" ea Romberg stance on foam EC x 30"  PATIENT EDUCATION:  Education details: continue HEP Person educated: Patient Education method: Explanation, Demonstration, Tactile cues, Verbal cues, and Handouts Education comprehension: verbalized understanding, returned demonstration, verbal cues required, and tactile cues required     HOME EXERCISE PROGRAM: Access Code: [email or web address] URL: [email or web address] Date: 03/02/2022 Prepared by: Karl ReShearon Baloises - Active Straight Leg Raise with Quad Set  - 1 x daily - 7 x weekly - 2 sets - 10 reps - 3 hold - Sidelying Hip Abduction  - 1 x daily - 7 x weekly - 2 sets - 10 reps - 3 hold - Sit to Stand Without Arm Support  - 1 x daily - 7 x weekly - 2 sets - 10 reps - 3 hold - Seated Knee Extension with Resistance  - 2 x daily - 7 x weekly - 3 sets - 10 reps  ASSESSMENT:   CLINICAL IMPRESSION: Pt was able to complete all prescribed exercises with no adverse effect or increase in pain. Therapy continued to progress quad and proximal hip strength in order to decrease pain and improve functional ability. She continues to benefit from skilled PT services and will continue to be seen and progressed as able per POC.   OBJECTIVE IMPAIRMENTS Abnormal gait, decreased activity tolerance, difficulty walking, decreased ROM, decreased strength, increased edema, obesity, and pain.     ACTIVITY LIMITATIONS  cleaning, community activity, driving, meal prep,  occupation, yard work, and shopping.    PERSONAL FACTORS 1 comorbidity: obesity  are also affecting patient's functional outcome.    GOALS:   SHORT TERM GOALS: Target date: 02/04/2022   Pt will be Ind in an initial HEP Baseline: started on eval Goal status: MET 02/03/22   2.  Gait will progress to pt walking c 1 crutch or SPC the majority of the time Baseline: crutches Status: no AD, min limp Goal status: MET 02/03/22   LONG TERM GOALS: Target date: 05/03/2022   Increase R hip strength to at least 4+/5 and R knee strength to 5/5 for improved function of the R LE Baseline: see flow sheet Goal status: INITIAL   2.  Increase R knee flexion ROM to 125d for improved R knee function  Baseline: see flow sheet Goal status: MET 02/03/22   3.  R knee ext lag c SAQ will increase to -5 for improved R LE function Baseline: -15 Goal status: INITIAL   4.  2MWT will increase to 450 ft as indication of improved function Baseline: 233 ft Goal status: INITIAL   5. Decrease 5XSTS to 20 sec or less as indication of improved function Baseline: 31.7 sec Goal status: MET 02/16/2022 with 17.52 seconds and no use of UE   6.  Pt will complete commmunity ambulation s an AD tolerating distances of 1028f with a mild to no limp over the R LE. Baseline:  Goal status: INITIAL     PLAN: PT FREQUENCY: 2x/week   PT DURATION: 8 weeks   PLANNED INTERVENTIONS: Therapeutic exercises, Therapeutic activity, Neuromuscular re-education, Balance training, Gait training, Patient/Family education, Joint mobilization, Stair training, Dry Needling, Electrical stimulation, Cryotherapy, Moist heat, scar mobilization, Taping, Vasopneumatic device, Ultrasound, Ionotophoresis 483mml Dexamethasone, and Manual therapy   PLAN FOR NEXT SESSION: assess response to HEP,  progress therex and complete gait training   SiLynden AngT  04/21/22 9:05 AM

## 2022-04-25 ENCOUNTER — Telehealth: Payer: Self-pay | Admitting: Physical Therapy

## 2022-04-25 ENCOUNTER — Ambulatory Visit: Payer: 59 | Admitting: Physical Therapy

## 2022-04-25 NOTE — Telephone Encounter (Signed)
LVM for pt with reminder of next appt and no show policy.

## 2022-04-27 ENCOUNTER — Ambulatory Visit: Payer: 59

## 2022-05-10 ENCOUNTER — Ambulatory Visit: Payer: 59

## 2022-05-10 ENCOUNTER — Ambulatory Visit: Payer: 59 | Attending: Student | Admitting: Physical Therapy

## 2022-05-10 ENCOUNTER — Encounter: Payer: Self-pay | Admitting: Physical Therapy

## 2022-05-10 DIAGNOSIS — R262 Difficulty in walking, not elsewhere classified: Secondary | ICD-10-CM | POA: Insufficient documentation

## 2022-05-10 DIAGNOSIS — M6281 Muscle weakness (generalized): Secondary | ICD-10-CM | POA: Diagnosis present

## 2022-05-10 NOTE — Therapy (Signed)
OUTPATIENT PHYSICAL THERAPY TREATMENT NOTE   [name] MRN: [id number] DOB:July 26, 1991, 31 y.o., female Today's Date: 05/10/2022  PCP: Patient, No Pcp Per REFERRING PROVIDER: Corinne Ports, PA-C  END OF SESSION:   PT End of Session - 05/10/22 1510     Visit Number 17    Number of Visits 22    Date for PT Re-Evaluation 05/03/22    Authorization Type [employer] OTHER;[place] MEDICAID [employer]    Progress Note Due on Visit 10    PT Start Time 1505    Activity Tolerance Patient tolerated treatment well    Behavior During Therapy [facility] for tasks assessed/performed                      Past Medical History:  Diagnosis Date   Anemia    Anxiety    Chlamydia 10/2011   Cholecystitis    Closed head injury with concussion    Gestational hypertension    Headache(784.0)    Heart palpitations    Hypertension    Past Surgical History:  Procedure Laterality Date   ORIF ANKLE FRACTURE Left 02/12/2016   Procedure: OPEN REDUCTION INTERNAL FIXATION (ORIF) ANKLE FRACTURE;  Surgeon: Altamese Richardson, MD;  Location: [place];  Service: Orthopedics;  Laterality: Left;   ORIF TIBIA PLATEAU Right 11/26/2021   Procedure: OPEN REDUCTION INTERNAL FIXATION (ORIF) TIBIAL PLATEAU;  Surgeon: [name] Needles, MD;  Location: [place];  Service: Orthopedics;  Laterality: Right;   Patient Active Problem List   Diagnosis Date Noted   Right medial tibial plateau fracture 11/24/2021   Fetal pericardial effusion affecting management of mother 08/11/2017   Pre-eclampsia 07/31/2017   Group B streptococcus urinary tract infection affecting pregnancy 04/24/2017   Encounter for supervision of normal pregnancy, unspecified, unspecified trimester 04/10/2017   History of gestational hypertension 04/10/2017   Short interval between pregnancies affecting pregnancy, antepartum 04/10/2017   Murmur, cardiac 02/10/2016    REFERRING DIAG: Right medial tibial plateau  fracture  THERAPY DIAG:  Muscle weakness (generalized) - Plan: PT plan of care cert/re-cert  Difficulty in walking, not elsewhere classified - Plan: PT plan of care cert/re-cert   SUBJECTIVE:    SUBJECTIVE STATEMENT: Pt states that she is having increased pain in her L hip. She reports no pain in her R knee.   PAIN:  Are you having pain?  No: NPRS scale: 0/10 knee, L hip 7/10  Pain location: lower back, knee Pain description: ache, throb Aggravating factors: Prolonged standing and walking 7/10 Relieving factors: Medications, moving it, rest  PERTINENT HISTORY:  Obesity, L ankle fx    PRECAUTIONS: None   WEIGHT BEARING RESTRICTIONS  WBAT    OBJECTIVE:     PATIENT SURVEYS:  FOTO 36%  Functional status 05/10/2022: 59%    LE ROM:   Active ROM Right 01/14/2022 Left 01/14/2022 Right  02/03/22 L/R  05/10/22  Hip flexion        Hip extension        Hip abduction        Hip adduction        Hip internal rotation        Hip external rotation        Knee flexion 120 130 135 130/128  Knee extension 0 0  0/0  Ankle dorsiflexion        Ankle plantarflexion        Ankle inversion        Ankle eversion         (  Blank rows = not tested) Ext lag R -15d, L -5d   LE MMT:   MMT Right 01/14/2022 Left 01/14/2022 R/L  Hip flexion 4 4+ 5/5  Hip extension 4 5 5/5  Hip abduction 4+ 5 5/5  Hip adduction 5 5   Hip internal rotation 5 5   Hip external rotation 4 5   Knee flexion 4+ 5 5/5  Knee extension 4+ 5 4+/5  Ankle dorsiflexion 5 5   Ankle plantarflexion 5 5   Ankle inversion       Ankle eversion        (Blank rows = not tested)   FUNCTIONAL TESTS:  5 times sit to stand: 31.7 s use of hands, decreased wt bearing R LE  02/16/2022: 17.52 with no UE use  2 minute walk test: 235 ft  05/10/22 12.35s without use of hands.  2 minute walk test: 578ft   TODAY'S TREATMENT: [facility] Adult PT Treatment:                                                DATE: 05/10/2022 There- ex:  [facility]  Self care: Discussed with pt anatomy and physiology of trochanteric bursitis in L hip. Treatments and cause. Reviewed progress with pt and came up with plan.    Ice over L hip for 10 min at end of session.   [facility] Adult PT Treatment:                                                DATE: 04/20/2022 Therapeutic Exercise: Nustep level 7 x 3 mins LE only while taking subjective Supine SLR 2x10 2.5# S/L hip abd 2x10 2.5#  STS 2x15 10# KB Lateral walk RTB 3x61ft Monster walk RTB 2x87ft Walll squat with physioball 2x10  Slant board gastroc stretch x60" Wall squat 2x10 Step down 2x10 6in Lunges x 76ft  Neuromuscular re-ed: SLS on foam x 45" ea  [facility] Adult PT Treatment:                                                DATE: 04/18/2022 Therapeutic Exercise: Nustep level 7 x 4 mins LE only while taking subjective Lateral walk RTB 3x58ft Monster walk RTB 2x30ft Slant board gastroc stretch x60" Knee ext machine - 10# - 2x10 - R only Knee flexion machine - 15# - 3x10 - R Wall squat 2x10 Step down 2x10 6in Standing hip abd 2x10 ea 30# Neuromuscular re-ed: SLS on foam 2x45" ea  [facility] Adult PT Treatment:                                                DATE: 04/13/2022 Therapeutic Exercise: Nustep level 7 x 4 mins LE only Lateral walk RTB 2x75ft Monster walk RTB x 52ft Slant board gastroc stretch x60" Knee ext machine - 10# - 2x10 - R only Knee flexion machine - 15# - 3x10 - R Wall squat 2x10  Standing hip abd/ext  x 10 ea 30# Neuromuscular re-ed: Tandem stance x45" ea SLS on foam 2x30" ea Romberg stance on foam EC x 30"  PATIENT EDUCATION:  Education details: continue HEP Person educated: Patient Education method: Explanation, Demonstration, Tactile cues, Verbal cues, and Handouts Education comprehension: verbalized understanding, returned demonstration, verbal cues required, and tactile cues required     HOME EXERCISE PROGRAM: Access Code: [email or web address] URL:  [email or web address] Date: 03/02/2022 Prepared by: Shearon Balo  Exercises - Active Straight Leg Raise with Quad Set  - 1 x daily - 7 x weekly - 2 sets - 10 reps - 3 hold - Sidelying Hip Abduction  - 1 x daily - 7 x weekly - 2 sets - 10 reps - 3 hold - Sit to Stand Without Arm Support  - 1 x daily - 7 x weekly - 2 sets - 10 reps - 3 hold - Seated Knee Extension with Resistance  - 2 x daily - 7 x weekly - 3 sets - 10 reps  ASSESSMENT:   CLINICAL IMPRESSION: Neriah Brott has progressed very well with therapy.  Improved impairments include: Sitting, squatting, walking, ROM, an strength. Progressions needed include: Reduction of pain in L hip due to overuse and compensation pattern.  Barriers to progress include: occupation, and family.  Pt has met all her PT and personal goals. She no longer demonstrates an antalgic gait pattern. She is now able to walk increased distances, squat, kneel and stand without reports of pain. Pt reports increased pain localized to L hip over bursa. Pt encouraged to utilize ice and NSAID's until she follows up with her MD in the next two weeks. Pt plans to follow up with PT after performing comprehensive HEP for 2 weeks. Please see GOALS section for progress on short term and long term goals established at evaluation.   OBJECTIVE IMPAIRMENTS Abnormal gait, decreased activity tolerance, difficulty walking, decreased ROM, decreased strength, increased edema, obesity, and pain.     ACTIVITY LIMITATIONS cleaning, community activity, driving, meal prep, occupation, yard work, and shopping.    PERSONAL FACTORS 1 comorbidity: obesity  are also affecting patient's functional outcome.    GOALS:   SHORT TERM GOALS: Target date: 02/04/2022   Pt will be Ind in an initial HEP Baseline: started on eval Goal status: MET 02/03/22   2.  Gait will progress to pt walking c 1 crutch or SPC the majority of the time Baseline: crutches Status: no AD, min limp Goal  status: MET 02/03/22   LONG TERM GOALS: Target date: 05/03/2022   Increase R hip strength to at least 4+/5 and R knee strength to 5/5 for improved function of the R LE Baseline: see flow sheet Goal status: MET 05/10/2022   2.  Increase R knee flexion ROM to 125d for improved R knee function  Baseline: see flow sheet Goal status: MET 02/03/22   3.  R knee ext lag c SAQ will increase to -5 for improved R LE function Baseline: -15 Goal status:    4.  2MWT will increase to 450 ft as indication of improved function Baseline: 233 ft Goal status: MET 05/10/2022   5. Decrease 5XSTS to 20 sec or less as indication of improved function Baseline: 31.7 sec Goal status: MET 02/16/2022 with 17.52 seconds and no use of UE   6.  Pt will complete commmunity ambulation s an AD tolerating distances of 1077f with a mild to no limp over the R LE. Baseline:  Goal status: MET 05/10/2022  PLAN: PT FREQUENCY: 1x/week   PT DURATION: 4 weeks   PLANNED INTERVENTIONS: Therapeutic exercises, Therapeutic activity, Neuromuscular re-education, Balance training, Gait training, Patient/Family education, Joint mobilization, Stair training, Dry Needling, Electrical stimulation, Cryotherapy, Moist heat, scar mobilization, Taping, Vasopneumatic device, Ultrasound, Ionotophoresis 73m/ml Dexamethasone, and Manual therapy   PLAN FOR NEXT SESSION: Pt plans to perform HEP for 2 weeks and reassess.    SLynden AngPT  05/10/22 3:56 PM

## 2022-05-16 DIAGNOSIS — S82131D Displaced fracture of medial condyle of right tibia, subsequent encounter for closed fracture with routine healing: Secondary | ICD-10-CM | POA: Diagnosis not present

## 2022-05-16 DIAGNOSIS — S82141D Displaced bicondylar fracture of right tibia, subsequent encounter for closed fracture with routine healing: Secondary | ICD-10-CM | POA: Diagnosis not present

## 2022-05-25 ENCOUNTER — Ambulatory Visit: Payer: 59

## 2022-05-25 DIAGNOSIS — M6281 Muscle weakness (generalized): Secondary | ICD-10-CM

## 2022-05-25 DIAGNOSIS — R262 Difficulty in walking, not elsewhere classified: Secondary | ICD-10-CM

## 2022-05-25 NOTE — Therapy (Signed)
OUTPATIENT PHYSICAL THERAPY TREATMENT NOTE/DISCHARGE  PHYSICAL THERAPY DISCHARGE SUMMARY  Visits from Start of Care: 18  Current functional level related to goals / functional outcomes: See goals and objective   Remaining deficits: See goals and objective   Education / Equipment: HEP   Patient agrees to discharge. Patient goals were met. Patient is being discharged due to meeting the stated rehab goals.   [name] MRN: [id number] DOB:[birth year], 31 y.o., female Today's Date: 05/25/2022  PCP: Patient, No Pcp Per REFERRING PROVIDER: Corinne Ports, PA-C  END OF SESSION:   PT End of Session - 05/25/22 1130     Visit Number 18    Number of Visits 22    Date for PT Re-Evaluation 05/03/22    Authorization Type [employer] OTHER;[place] MEDICAID [employer]    Progress Note Due on Visit 10    PT Start Time 1129    PT Stop Time 1207    PT Time Calculation (min) 38 min    Activity Tolerance Patient tolerated treatment well    Behavior During Therapy [facility] for tasks assessed/performed                       Past Medical History:  Diagnosis Date   Anemia    Anxiety    Chlamydia 10/2011   Cholecystitis    Closed head injury with concussion    Gestational hypertension    Headache(784.0)    Heart palpitations    Hypertension    Past Surgical History:  Procedure Laterality Date   ORIF ANKLE FRACTURE Left 02/12/2016   Procedure: OPEN REDUCTION INTERNAL FIXATION (ORIF) ANKLE FRACTURE;  Surgeon: Altamese [place], MD;  Location: [place];  Service: Orthopedics;  Laterality: Left;   ORIF TIBIA PLATEAU Right 11/26/2021   Procedure: OPEN REDUCTION INTERNAL FIXATION (ORIF) TIBIAL PLATEAU;  Surgeon: [name] Needles, MD;  Location: [place];  Service: Orthopedics;  Laterality: Right;   Patient Active Problem List   Diagnosis Date Noted   Right medial tibial plateau fracture 11/24/2021   Fetal pericardial effusion affecting management of mother  08/11/2017   Pre-eclampsia 07/31/2017   Group B streptococcus urinary tract infection affecting pregnancy 04/24/2017   Encounter for supervision of normal pregnancy, unspecified, unspecified trimester 04/10/2017   History of gestational hypertension 04/10/2017   Short interval between pregnancies affecting pregnancy, antepartum 04/10/2017   Murmur, cardiac 02/10/2016    REFERRING DIAG: Right medial tibial plateau fracture  THERAPY DIAG:  Muscle weakness (generalized)  Difficulty in walking, not elsewhere classified   SUBJECTIVE:    SUBJECTIVE STATEMENT: Pt presents to PT with continued reports of L hip pain. Notes that her R knee is feeling very well and she is pleased with her progress. She has a f/u visit with orthopedics on 8/31 regarding onset of hip pain. Is ready to begin PT at this time.   PAIN:  Are you having pain?  No: NPRS scale: 0/10 knee, L hip 7/10  Pain location: lower back, knee Pain description: ache, throb Aggravating factors: Prolonged standing and walking 7/10 Relieving factors: Medications, moving it, rest  PERTINENT HISTORY:  Obesity, L ankle fx    PRECAUTIONS: None   WEIGHT BEARING RESTRICTIONS  WBAT    OBJECTIVE:     PATIENT SURVEYS:  FOTO: 69% function   LE ROM:   Active ROM Right 01/14/2022 Left 01/14/2022 Right  02/03/22 L/R  05/10/22  Hip flexion        Hip extension  Hip abduction        Hip adduction        Hip internal rotation        Hip external rotation        Knee flexion 120 130 135 130/128  Knee extension 0 0  0/0  Ankle dorsiflexion        Ankle plantarflexion        Ankle inversion        Ankle eversion         (Blank rows = not tested) Ext lag R -15d, L -5d   LE MMT:   MMT Right 01/14/2022 Left 01/14/2022 R/L  Hip flexion 4 4+ 5/5  Hip extension 4 5 5/5  Hip abduction 4+ 5 5/5  Hip adduction 5 5   Hip internal rotation 5 5   Hip external rotation 4 5   Knee flexion 4+ 5 5/5  Knee extension 4+ 5 4+/5   Ankle dorsiflexion 5 5   Ankle plantarflexion 5 5   Ankle inversion       Ankle eversion        (Blank rows = not tested)   FUNCTIONAL TESTS:  5 times sit to stand: 31.7 s use of hands, decreased wt bearing R LE  02/16/2022: 17.52 with no UE use  2 minute walk test: 235 ft  05/10/22 12.35s without use of hands.  2 minute walk test: 575f   [facility]                                                [facility]: 05/25/2022 Therapeutic Exercise: Nustep level 6 x 4 mins LE only while taking subjective Supine SLR x 15 each S/L hip abd x 15 each STS x 10 Lateral walk GTB 2x147f Monster walk GTB 2x1569ftanding hip abd/ext x 15 GTB Tandem stance x 30" each SLS x 30" each Therapeutic Activity: Assessment of tests/measures, goals, and outcomes for discharge assessment  PATIENT EDUCATION:  Education details: continue HEP Person educated: Patient Education method: Explanation, Demonstration, Tactile cues, Verbal cues, and Handouts Education comprehension: verbalized understanding, returned demonstration, verbal cues required, and tactile cues required     HOME EXERCISE PROGRAM: Access Code: [email or web address] URL: [email or web address] Date: 05/25/2022 Prepared by: DavOctavio Mannsxercises - Active Straight Leg Raise with Quad Set  - 3 x weekly - 2 sets - 15 reps - 3 hold - Sidelying Hip Abduction  - 3 x weekly - 2 sets - 15 reps - 3 hold - Sit to Stand Without Arm Support  - 3 x weekly - 2 sets - 10 reps - Seated Figure 4 Piriformis Stretch  - 1 x daily - 7 x weekly - 2-3 reps - 30 sec hold - Side Stepping with Resistance at Ankles  - 1 x daily - 7 x weekly - 2-3 sets - [id number] - Forward Monster Walks  - 1 x daily - 7 x weekly - 2-3 sets - [id number] - Standing Hip Abduction with Resistance at Ankles and Counter Support  - 1 x daily - 7 x weekly - 3 sets - 15 reps - green hold - Standing Hip Extension with Resistance at Ankles and Counter  Support  - 1 x daily - 7 x weekly - 3 sets - 15 reps - green theraband hold -  Tandem Stance  - 1 x daily - 7 x weekly - 2-3 reps - 30 sec hold - Single Leg Stance  - 1 x daily - 7 x weekly - 2-3 reps - 30 sec hold  ASSESSMENT:   CLINICAL IMPRESSION: Pt was able to complete all prescribed exercises and demonstrated knowledge of HEP with no adverse effect.  Over the course of PT treatment she has progressed very well, showing improved R knee strength and ROM post injury. She has met all LTGs for therapy and should continue to improve with HEP compliance. Pt in agreement with current plan and is ready to discharge from skilled therapy at this time.  OBJECTIVE IMPAIRMENTS Abnormal gait, decreased activity tolerance, difficulty walking, decreased ROM, decreased strength, increased edema, obesity, and pain.     ACTIVITY LIMITATIONS cleaning, community activity, driving, meal prep, occupation, yard work, and shopping.    PERSONAL FACTORS 1 comorbidity: obesity  are also affecting patient's functional outcome.    GOALS:   SHORT TERM GOALS: Target date: 02/04/2022   Pt will be Ind in an initial HEP Baseline: started on eval Goal status: MET 02/03/22   2.  Gait will progress to pt walking c 1 crutch or SPC the majority of the time Baseline: crutches Status: no AD, min limp Goal status: MET 02/03/22   LONG TERM GOALS: Target date: 05/03/2022   Increase R hip strength to at least 4+/5 and R knee strength to 5/5 for improved function of the R LE Baseline: see flow sheet Goal status: MET 05/10/2022   2.  Increase R knee flexion ROM to 125d for improved R knee function  Baseline: see flow sheet Goal status: MET 02/03/22   3.  R knee ext lag c SAQ will increase to -5 for improved R LE function Baseline: -15 Goal status: MET   4.  2MWT will increase to 450 ft as indication of improved function Baseline: 233 ft Goal status: MET 05/10/2022   5. Decrease 5XSTS to 20 sec or less as indication of  improved function Baseline: 31.7 sec Goal status: MET 02/16/2022 with 17.52 seconds and no use of UE   6.  Pt will complete commmunity ambulation s an AD tolerating distances of 1073f with a mild to no limp over the R LE. Baseline:  Goal status: MET 05/10/2022     PLAN: PT FREQUENCY: 1x/week   PT DURATION: 4 weeks   PLANNED INTERVENTIONS: Therapeutic exercises, Therapeutic activity, Neuromuscular re-education, Balance training, Gait training, Patient/Family education, Joint mobilization, Stair training, Dry Needling, Electrical stimulation, Cryotherapy, Moist heat, scar mobilization, Taping, Vasopneumatic device, Ultrasound, Ionotophoresis 436mml Dexamethasone, and Manual therapy   PLAN FOR NEXT SESSION: Pt plans to perform HEP for 2 weeks and reassess.    DaWard ChattersT  05/25/22 12:10 PM

## 2022-06-28 ENCOUNTER — Emergency Department (HOSPITAL_COMMUNITY)
Admission: EM | Admit: 2022-06-28 | Discharge: 2022-06-28 | Discharge status: Against Medical Advice | Payer: 59 | Attending: Emergency Medicine | Admitting: Emergency Medicine

## 2022-06-28 ENCOUNTER — Encounter (HOSPITAL_COMMUNITY): Payer: Self-pay | Admitting: Emergency Medicine

## 2022-06-28 ENCOUNTER — Other Ambulatory Visit: Payer: Self-pay

## 2022-06-28 DIAGNOSIS — R1909 Other intra-abdominal and pelvic swelling, mass and lump: Secondary | ICD-10-CM | POA: Insufficient documentation

## 2022-06-28 DIAGNOSIS — Z5321 Procedure and treatment not carried out due to patient leaving prior to being seen by health care provider: Secondary | ICD-10-CM | POA: Insufficient documentation

## 2022-06-28 LAB — URINALYSIS, ROUTINE W REFLEX MICROSCOPIC
Bacteria, UA: NONE SEEN
Bilirubin Urine: NEGATIVE
Glucose, UA: NEGATIVE mg/dL
Hgb urine dipstick: NEGATIVE
Ketones, ur: NEGATIVE mg/dL
Leukocytes,Ua: NEGATIVE
Nitrite: NEGATIVE
Protein, ur: NEGATIVE mg/dL
Specific Gravity, Urine: 1.02 (ref 1.005–1.030)
pH: 8 (ref 5.0–8.0)

## 2022-06-28 LAB — I-STAT BETA HCG BLOOD, ED (MC, WL, AP ONLY): I-stat hCG, quantitative: <5 m[IU]/mL (ref <5)

## 2022-06-28 LAB — HIV ANTIBODY (ROUTINE TESTING W REFLEX): HIV Screen 4th Generation wRfx: NONREACTIVE

## 2022-06-28 LAB — RPR: RPR Ser Ql: NONREACTIVE

## 2022-06-28 NOTE — ED Triage Notes (Signed)
Pt states she has several bumps around her perineum. She is concerned it may be an STD. Pt denies any itching or draining.

## 2022-06-28 NOTE — ED Provider Triage Note (Signed)
Emergency Medicine Provider Triage Evaluation Note  [name] [name] , a 31 y.o. female  was evaluated in triage.  Pt complains of bumps to perineum. Also concerned for stis last tested in April; and had trich.   Review of Systems  Positive: Bumps in groin Negative: Fever   Physical Exam  BP 127/72 (BP Location: Right Arm)   Pulse 78   Temp 97.9 F (36.6 C) (Oral)   Resp 15   LMP 06/21/2022 (Within Days)   SpO2 100%   Breastfeeding Unknown  Gen:   Awake, no distress   Resp:  Normal effort  MSK:   Moves extremities without difficulty  Other:  Deferred   Medical Decision Making  Medically screening exam initiated at 11:42 AM.  Appropriate orders placed.  [name] [name] was informed that the remainder of the evaluation will be completed by another provider, this initial triage assessment does not replace that evaluation, and the importance of remaining in the ED until their evaluation is complete.  [address]    [name] [name], [place] 06/28/22 1144

## 2022-06-28 NOTE — ED Notes (Signed)
Accidentally clicked off probe amp.

## 2022-06-28 NOTE — ED Notes (Signed)
Pt no longer wanted to wait. Pt left the Hospital  

## 2022-07-12 ENCOUNTER — Ambulatory Visit (INDEPENDENT_AMBULATORY_CARE_PROVIDER_SITE_OTHER): Payer: 59 | Admitting: Advanced Practice Midwife

## 2022-07-12 ENCOUNTER — Encounter: Payer: Self-pay | Admitting: Advanced Practice Midwife

## 2022-07-12 ENCOUNTER — Other Ambulatory Visit (HOSPITAL_COMMUNITY)
Admission: RE | Admit: 2022-07-12 | Discharge: 2022-07-12 | Disposition: A | Discharge status: Home / Self Care | Payer: 59 | Source: Ambulatory Visit | Attending: Advanced Practice Midwife | Admitting: Advanced Practice Midwife

## 2022-07-12 VITALS — BP 134/89 | HR 89 | Ht 66.0 in | Wt 242.2 lb

## 2022-07-12 DIAGNOSIS — A630 Anogenital (venereal) warts: Secondary | ICD-10-CM

## 2022-07-12 DIAGNOSIS — N949 Unspecified condition associated with female genital organs and menstrual cycle: Secondary | ICD-10-CM | POA: Diagnosis present

## 2022-07-12 DIAGNOSIS — N898 Other specified noninflammatory disorders of vagina: Secondary | ICD-10-CM | POA: Diagnosis not present

## 2022-07-12 DIAGNOSIS — Z113 Encounter for screening for infections with a predominantly sexual mode of transmission: Secondary | ICD-10-CM

## 2022-07-12 LAB — POCT URINALYSIS DIPSTICK
Bilirubin, UA: NEGATIVE
Blood, UA: NEGATIVE
Glucose, UA: NEGATIVE
Ketones, UA: NEGATIVE
Leukocytes, UA: NEGATIVE
Nitrite, UA: NEGATIVE
Protein, UA: NEGATIVE
Spec Grav, UA: 1.015
Urobilinogen, UA: 0.2 U/dL
pH, UA: 6

## 2022-07-12 MED ORDER — IMIQUIMOD 5 % EX CREA: TOPICAL_CREAM | CUTANEOUS | 1 refills | Status: DC

## 2022-07-12 NOTE — Progress Notes (Signed)
   GYNECOLOGY PROGRESS NOTE  History:  31 y.o. [id number] presents to [place] office today for problem gyn visit. She reports bumps in the posterior vaginal/perineal area x 2 weeks. There is burning with urination, no other symptoms.  She denies h/a, dizziness, shortness of breath, n/v, or fever/chills.    The following portions of the patient's history were reviewed and updated as appropriate: allergies, current medications, past family history, past medical history, past social history, past surgical history and problem list. Last pap smear on 05/04/20 was normal.  Health Maintenance Due  Topic Date Due   COVID-19 Vaccine (1) Never done   Hepatitis C Screening  Never done   INFLUENZA VACCINE  05/03/2022     Review of Systems:  Pertinent items are noted in HPI.   Objective:  Physical Exam Blood pressure 134/89, pulse 89, height 5\' 6"  (1.676 m), weight 242 lb 3.2 oz (109.9 kg), last menstrual period 06/21/2022, unknown if currently breastfeeding. VS reviewed, nursing note reviewed,  Constitutional: well developed, well nourished, no distress HEENT: normocephalic CV: normal rate Pulm/chest wall: normal effort Breast Exam: deferred Abdomen: soft Neuro: alert and oriented x 3 Skin: warm, dry Psych: affect normal Pelvic exam: On visual inspection, cluster of irregular, bumpy, cauliflower-like lesions on perineum and smaller area on posterior vaginal wall, immediately inside perineum. Not tender to palpation, color pale flesh color with some white at tips of lesion.  Assessment & Plan:  1. Vaginal discomfort  - Cervicovaginal ancillary only - POCT urinalysis dipstick - Urine Culture  2. Vaginal lesion  3. Genital warts --Discussed diagnosis with pt, made clinically today. Discussed treatment options, including expectant management, topical treatment at home, or TCA treatments in the office.   --Pt to try Aldara at home, Rx sent.  - imiquimod (ALDARA) 5 % cream; Apply topically  3 (three) times a week.  Dispense: 12 each; Refill: 1  4. Routine screening for STI (sexually transmitted infection) --Vaginal swab collected today  No follow-ups on file.   Fatima Blank, CNM 3:36 PM

## 2022-07-12 NOTE — Progress Notes (Signed)
Pt reports bumps on her perineum. They showed up about 3 weeks ago. Pt states she hasn't had intercourse in 2 months. Pt has recently switched fabric softeners and believes that may be the culprit, but would like STD testing. No other concerns at this time.

## 2022-07-13 ENCOUNTER — Telehealth: Payer: 59 | Admitting: Family

## 2022-07-13 DIAGNOSIS — B3731 Acute candidiasis of vulva and vagina: Secondary | ICD-10-CM

## 2022-07-13 DIAGNOSIS — N760 Acute vaginitis: Secondary | ICD-10-CM | POA: Diagnosis not present

## 2022-07-13 DIAGNOSIS — B9689 Other specified bacterial agents as the cause of diseases classified elsewhere: Secondary | ICD-10-CM

## 2022-07-13 DIAGNOSIS — A599 Trichomoniasis, unspecified: Secondary | ICD-10-CM | POA: Diagnosis not present

## 2022-07-13 LAB — CERVICOVAGINAL ANCILLARY ONLY
Bacterial Vaginitis (gardnerella): POSITIVE — AB
Candida Glabrata: NEGATIVE
Candida Vaginitis: POSITIVE — AB
Chlamydia: NEGATIVE
Comment: NEGATIVE
Comment: NEGATIVE
Comment: NEGATIVE
Comment: NEGATIVE
Comment: NEGATIVE
Comment: NORMAL
Neisseria Gonorrhea: NEGATIVE
Trichomonas: POSITIVE — AB

## 2022-07-13 MED ORDER — METRONIDAZOLE 500 MG PO TABS: 500.0000 mg | ORAL_TABLET | Freq: Two times a day (BID) | ORAL | 0 refills | Status: DC

## 2022-07-13 MED ORDER — FLUCONAZOLE 150 MG PO TABS: 150.0000 mg | ORAL_TABLET | ORAL | 0 refills | Status: DC | PRN

## 2022-07-13 MED ORDER — ONDANSETRON HCL 4 MG PO TABS: 4.0000 mg | ORAL_TABLET | Freq: Three times a day (TID) | ORAL | 0 refills | Status: DC | PRN

## 2022-07-13 NOTE — Patient Instructions (Signed)
Trichomoniasis Trichomoniasis is a sexually transmitted infection (STI). Many people with trichomoniasis do not have any symptoms (are asymptomatic) or have only minimal symptoms. Untreated trichomoniasis can last from months to years. This condition is treated with medicine. What are the causes? This condition is caused by a parasite called Trichomonas vaginalis and is transmitted during sexual contact. What increases the risk? The following factors may make you more likely to develop this condition: Having unprotected sex. Having sex with a partner who has trichomoniasis. Having multiple sexual partners. Having had previous trichomoniasis infections or other STIs. What are the signs or symptoms? In females, symptoms of trichomoniasis include: Itching, burning, redness, or soreness in the genital area. Discomfort while urinating. Abnormal vaginal discharge that is clear, white, gray, or yellow-green and foamy and has an unusual fishy odor. In males, symptoms of trichomoniasis include: Discharge from the penis. Burning after urination or ejaculation. Itching or discomfort inside the penis. How is this diagnosed? This condition is diagnosed based on tests. To perform a test, your health care provider will do one of the following: Ask you to provide a urine sample. Take a sample of discharge. The sample may be taken from the vagina or cervix in females and from the urethra in males. Your health care provider may use a swab to collect the sample. Your health care provider may test you for other STIs, including human immunodeficiency virus (HIV). How is this treated?  This condition is treated with medicines such as metronidazole or tinidazole. These are called antimicrobial medicines, and they are taken by mouth (orally). Your sexual partner or partners also need to be tested and treated. If they have the infection and are not treated, you will likely get reinfected. If you plan to become  pregnant or think you may be pregnant, tell your health care provider right away. Some medicines that are used to treat the infection should not be taken during pregnancy. Your health care provider may recommend over-the-counter medicines or creams to help relieve itching or irritation. You may be tested for the infection again 3 months after treatment. Follow these instructions at home: Medicines Take over-the-counter and prescription medicines only as told by your health care provider. Take your antimicrobial medicine as told by your health care provider. Do not stop taking it even if you start to feel better. Use creams as told by your health care provider. General instructions Do not have sex until after you finish your medicine and your symptoms have resolved. Do not wear tampons while you have the infection (if you are female). Talk with your sexual partner or partners about any symptoms that either of you may have, as well as any history of STIs. Keep all follow-up visits. This is important. How is this prevented?  Use condoms every time you have sex. Using condoms correctly and consistently can help protect against STIs. Do not have sexual contact if you have symptoms of trichomoniasis or another STI. Avoid having multiple sexual partners. Get tested for STIs before you have sex with a partner. Ask all partners to do the same. Do not douche (if you are female). Douching may increase your risk for getting STIs due to the removal of good bacteria in the vagina. Contact a health care provider if: You still have symptoms after you finish your medicine. You develop a rash. You plan to become pregnant or think you may be pregnant. Summary Trichomoniasis is a sexually transmitted infection (STI). This condition often has no symptoms or minimal   symptoms. Take your antimicrobial medicine as told by your health care provider. Do not stop taking even if you start to feel better. Discuss your  infection with your sexual partner or partners. Make sure that all partners get tested and treated, if necessary. You should not have sex until after you finish your medicine and your symptoms have resolved. Keep all follow-up visits. This is important. This information is not intended to replace advice given to you by your health care provider. Make sure you discuss any questions you have with your health care provider. Document Revised: 08/18/2021 Document Reviewed: 08/18/2021 Elsevier Patient Education  2023 Elsevier Inc.  

## 2022-07-13 NOTE — Progress Notes (Signed)
Virtual Visit Consent   [name] [name], you are scheduled for a virtual visit with a [facility] provider today. Just as with appointments in the office, your consent must be obtained to participate. Your consent will be active for this visit and any virtual visit you may have with one of our providers in the next 365 days. If you have a MyChart account, a copy of this consent can be sent to you electronically.  As this is a virtual visit, video technology does not allow for your provider to perform a traditional examination. This may limit your provider's ability to fully assess your condition. If your provider identifies any concerns that need to be evaluated in person or the need to arrange testing (such as labs, EKG, etc.), we will make arrangements to do so. Although advances in technology are sophisticated, we cannot ensure that it will always work on either your end or our end. If the connection with a video visit is poor, the visit may have to be switched to a telephone visit. With either a video or telephone visit, we are not always able to ensure that we have a secure connection.  By engaging in this virtual visit, you consent to the provision of healthcare and authorize for your insurance to be billed (if applicable) for the services provided during this visit. Depending on your insurance coverage, you may receive a charge related to this service.  I need to obtain your verbal consent now. Are you willing to proceed with your visit today? [name] [name] has provided verbal consent on 07/13/2022 for a virtual visit (video or telephone). Jannifer Rodney, FNP  Date: 07/13/2022 5:05 PM  Virtual Visit via Video Note   I, Jannifer Rodney, connected with  [name] [name]  ([id number], 04/21/1991) on 07/13/22 at  5:00 PM EDT by a video-enabled telemedicine application and verified that I am speaking with the correct person using two identifiers.  Location: Patient: Virtual Visit Location Patient: Other:  car Provider: Virtual Visit Location Provider: Home Office   I discussed the limitations of evaluation and management by telemedicine and the availability of in person appointments. The patient expressed understanding and agreed to proceed.    History of Present Illness: [name] [name] is a 31 y.o. who identifies as a female who was assigned female at birth, and is being seen today for positive trich, bacterial, and candida infection. She reports she saw her GYN yesterday and has not called her with her results.   HPI: HPI  Problems:  Patient Active Problem List   Diagnosis Date Noted   Right medial tibial plateau fracture 11/24/2021   Fetal pericardial effusion affecting management of mother 08/11/2017   Pre-eclampsia 07/31/2017   Group B streptococcus urinary tract infection affecting pregnancy 04/24/2017   Encounter for supervision of normal pregnancy, unspecified, unspecified trimester 04/10/2017   History of gestational hypertension 04/10/2017   Short interval between pregnancies affecting pregnancy, antepartum 04/10/2017   Murmur, cardiac 02/10/2016    Allergies:  Allergies  Allergen Reactions   Metronidazole Nausea And Vomiting   Medications:  Current Outpatient Medications:    fluconazole (DIFLUCAN) 150 MG tablet, Take 1 tablet (150 mg total) by mouth every three (3) days as needed., Disp: 3 tablet, Rfl: 0   metroNIDAZOLE (FLAGYL) 500 MG tablet, Take 1 tablet (500 mg total) by mouth 2 (two) times daily., Disp: 14 tablet, Rfl: 0   ondansetron (ZOFRAN) 4 MG tablet, Take 1 tablet (4 mg total) by mouth every 8 (eight) hours as  needed for nausea or vomiting., Disp: 20 tablet, Rfl: 0   imiquimod (ALDARA) 5 % cream, Apply topically 3 (three) times a week., Disp: 12 each, Rfl: 1   metoCLOPramide (REGLAN) 10 MG tablet, Take 1 tablet (10 mg total) by mouth every 6 (six) hours., Disp: 30 tablet, Rfl: 0  Observations/Objective: Patient is well-developed, well-nourished in no acute  distress.  Resting comfortably   Head is normocephalic, atraumatic.  No labored breathing.  Speech is clear and coherent with logical content.  Patient is alert and oriented at baseline.    Assessment and Plan: 1. Trichomoniasis - metroNIDAZOLE (FLAGYL) 500 MG tablet; Take 1 tablet (500 mg total) by mouth 2 (two) times daily.  Dispense: 14 tablet; Refill: 0 - ondansetron (ZOFRAN) 4 MG tablet; Take 1 tablet (4 mg total) by mouth every 8 (eight) hours as needed for nausea or vomiting.  Dispense: 20 tablet; Refill: 0  2. Bacterial vaginosis - metroNIDAZOLE (FLAGYL) 500 MG tablet; Take 1 tablet (500 mg total) by mouth 2 (two) times daily.  Dispense: 14 tablet; Refill: 0 - ondansetron (ZOFRAN) 4 MG tablet; Take 1 tablet (4 mg total) by mouth every 8 (eight) hours as needed for nausea or vomiting.  Dispense: 20 tablet; Refill: 0  3. Candida vaginitis - fluconazole (DIFLUCAN) 150 MG tablet; Take 1 tablet (150 mg total) by mouth every three (3) days as needed.  Dispense: 3 tablet; Refill: 0  Will send in medications for BV, yeast, and Trich Follow up with GYN  Safe sex, avoid sex until completion of antibiotics  Take zofran 20 mins prior to flagyl since it causes N&V  Follow Up Instructions: I discussed the assessment and treatment plan with the patient. The patient was provided an opportunity to ask questions and all were answered. The patient agreed with the plan and demonstrated an understanding of the instructions.  A copy of instructions were sent to the patient via MyChart unless otherwise noted below.     The patient was advised to call back or seek an in-person evaluation if the symptoms worsen or if the condition fails to improve as anticipated.  Time:  I spent 11 minutes with the patient via telehealth technology discussing the above problems/concerns.    Evelina Dun, FNP

## 2022-07-14 LAB — URINE CULTURE

## 2022-07-17 ENCOUNTER — Encounter: Payer: Self-pay | Admitting: Advanced Practice Midwife

## 2022-09-12 ENCOUNTER — Encounter: Payer: Self-pay | Admitting: Student

## 2022-09-12 ENCOUNTER — Other Ambulatory Visit (HOSPITAL_COMMUNITY)
Admission: RE | Admit: 2022-09-12 | Discharge: 2022-09-12 | Disposition: A | Discharge status: Home / Self Care | Payer: 59 | Source: Ambulatory Visit | Attending: Advanced Practice Midwife | Admitting: Advanced Practice Midwife

## 2022-09-12 ENCOUNTER — Ambulatory Visit (INDEPENDENT_AMBULATORY_CARE_PROVIDER_SITE_OTHER): Payer: 59 | Admitting: Student

## 2022-09-12 VITALS — BP 137/94 | HR 81 | Ht 66.0 in | Wt 241.0 lb

## 2022-09-12 DIAGNOSIS — A630 Anogenital (venereal) warts: Secondary | ICD-10-CM | POA: Diagnosis not present

## 2022-09-12 DIAGNOSIS — Z01419 Encounter for gynecological examination (general) (routine) without abnormal findings: Secondary | ICD-10-CM

## 2022-09-12 DIAGNOSIS — A599 Trichomoniasis, unspecified: Secondary | ICD-10-CM

## 2022-09-12 NOTE — Patient Instructions (Addendum)
Try Vitamin b12, Zinc, Mushroom Extract supplements Apply Aldara every other day  Follow-up in 2 months

## 2022-09-12 NOTE — Progress Notes (Unsigned)
Patient presents for f/u for genital warts. Patient states that she does not think it has work, and thinks that it is spreading. Also needs TOC for +trich 07/12/22. Patient desires to be checked for everything. No other concerns.

## 2022-09-13 LAB — CERVICOVAGINAL ANCILLARY ONLY
Bacterial Vaginitis (gardnerella): NEGATIVE
Candida Glabrata: NEGATIVE
Candida Vaginitis: POSITIVE — AB
Chlamydia: NEGATIVE
Comment: NEGATIVE
Comment: NEGATIVE
Comment: NEGATIVE
Comment: NEGATIVE
Comment: NEGATIVE
Comment: NORMAL
Neisseria Gonorrhea: NEGATIVE
Trichomonas: NEGATIVE

## 2022-09-13 NOTE — Progress Notes (Signed)
  History:  [name] is a 31 y.o. [id number] who presents to clinic today for genital wart follow-up. Patient has been applying Aldara Sunday-Tuesday weekly for 7 weeks. Patient experiencing immense burning for 2-3 days following.    In October, patient was diagnosed with HPV genital warts and started on Aldara cream. Patient is unsure if warts have changed in appearance since initiating, but is concerned that they have not improved in size.   The following portions of the patient's history were reviewed and updated as appropriate: allergies, current medications, family history, past medical history, social history, past surgical history and problem list.  Review of Systems:  Review of Systems  Constitutional:  Negative for chills, fever and weight loss.  Respiratory:  Negative for cough and shortness of breath.   Cardiovascular:  Negative for palpitations.  Genitourinary:  Positive for dysuria. Negative for frequency, hematuria and urgency.       Frequent burning in vaginal area  Skin:  Positive for itching.      Objective:  Physical Exam BP (!) 137/94   Pulse 81   Ht 5\' 6"  (1.676 m)   Wt 241 lb (109.3 kg)   LMP 09/04/2022 (Within Days)   BMI 38.90 kg/m  Physical Exam Exam conducted with a chaperone present.  Constitutional:      Appearance: Normal appearance. She is obese.  Cardiovascular:     Rate and Rhythm: Normal rate.  Pulmonary:     Effort: Pulmonary effort is normal.  Genitourinary:    Exam position: Supine.     Urethra: No urethral pain or urethral lesion.    Skin:    General: Skin is warm and dry.     Findings: Lesion present.  Neurological:     Mental Status: She is alert.  Psychiatric:        Behavior: Behavior normal.        Judgment: Judgment normal.       Labs and Imaging No results found for this or any previous visit (from the past 24 hour(s)).  No results found.  Health Maintenance Due  Topic Date Due   COVID-19 Vaccine (1) Never done    Hepatitis C Screening  Never done   INFLUENZA VACCINE  05/03/2022    Labs, imaging and previous visits in Epic and Care Everywhere reviewed  Assessment & Plan:  1. Encounter for gynecological examination 2. Genital warts - Continue application of Aldara. Instructed to apply every other day, with max of 3 applications in 7 day period. For example Sunday, Wednesday, Friday application schedule. Counseled that can also trial twice a week to build up tolerance. Plan for patient to follow-up 12-16 weeks following initiation of Aldara treatment or sooner if symptoms continue to worsen. If therapy is failing at that time, recommend application of TCA. Questions answered.   3. Trichomonas infection - TOC collected today - Cervicovaginal ancillary only( [place])   Approximately 15 minutes of total time was spent with this patient on counseling and coordination of care.   Wednesday, NP

## 2022-09-15 ENCOUNTER — Other Ambulatory Visit: Payer: Self-pay | Admitting: Student

## 2022-09-15 DIAGNOSIS — N949 Unspecified condition associated with female genital organs and menstrual cycle: Secondary | ICD-10-CM

## 2022-09-15 DIAGNOSIS — B3731 Acute candidiasis of vulva and vagina: Secondary | ICD-10-CM

## 2022-09-15 MED ORDER — FLUCONAZOLE 150 MG PO TABS: 150.0000 mg | ORAL_TABLET | Freq: Once | ORAL | 1 refills | Status: AC

## 2022-09-23 ENCOUNTER — Other Ambulatory Visit: Payer: Self-pay | Admitting: Emergency Medicine

## 2022-09-23 DIAGNOSIS — A630 Anogenital (venereal) warts: Secondary | ICD-10-CM

## 2022-09-23 DIAGNOSIS — N898 Other specified noninflammatory disorders of vagina: Secondary | ICD-10-CM

## 2022-09-23 MED ORDER — IMIQUIMOD 5 % EX CREA: TOPICAL_CREAM | CUTANEOUS | 5 refills | Status: DC

## 2022-09-23 NOTE — Progress Notes (Signed)
Rx for Aldara, approved by Dr. Clearance Coots, MD for 5 refills.

## 2023-01-26 ENCOUNTER — Ambulatory Visit: Payer: 59 | Admitting: Obstetrics and Gynecology

## 2023-02-01 ENCOUNTER — Ambulatory Visit: Payer: 59 | Admitting: Obstetrics and Gynecology

## 2023-02-03 ENCOUNTER — Encounter: Payer: Self-pay | Admitting: Obstetrics and Gynecology

## 2023-02-03 ENCOUNTER — Ambulatory Visit (INDEPENDENT_AMBULATORY_CARE_PROVIDER_SITE_OTHER): Payer: Medicaid Other | Admitting: Obstetrics and Gynecology

## 2023-02-03 ENCOUNTER — Other Ambulatory Visit (HOSPITAL_COMMUNITY)
Admission: RE | Admit: 2023-02-03 | Discharge: 2023-02-03 | Disposition: A | Discharge status: Home / Self Care | Payer: Medicaid Other | Source: Ambulatory Visit | Attending: Obstetrics and Gynecology | Admitting: Obstetrics and Gynecology

## 2023-02-03 VITALS — BP 132/86 | HR 89 | Ht 66.0 in | Wt 259.0 lb

## 2023-02-03 DIAGNOSIS — Z01419 Encounter for gynecological examination (general) (routine) without abnormal findings: Secondary | ICD-10-CM | POA: Diagnosis not present

## 2023-02-03 DIAGNOSIS — Z124 Encounter for screening for malignant neoplasm of cervix: Secondary | ICD-10-CM

## 2023-02-03 DIAGNOSIS — Z113 Encounter for screening for infections with a predominantly sexual mode of transmission: Secondary | ICD-10-CM | POA: Insufficient documentation

## 2023-02-03 DIAGNOSIS — L299 Pruritus, unspecified: Secondary | ICD-10-CM | POA: Diagnosis not present

## 2023-02-03 DIAGNOSIS — R350 Frequency of micturition: Secondary | ICD-10-CM

## 2023-02-03 LAB — POCT URINALYSIS DIPSTICK
Bilirubin, UA: NEGATIVE
Blood, UA: NEGATIVE
Glucose, UA: NEGATIVE
Ketones, UA: NEGATIVE
Leukocytes, UA: NEGATIVE
Nitrite, UA: NEGATIVE
Protein, UA: POSITIVE — AB
Spec Grav, UA: 1.025 (ref 1.010–1.025)
Urobilinogen, UA: 0.2 E.U./dL
pH, UA: 6 (ref 5.0–8.0)

## 2023-02-03 NOTE — Progress Notes (Signed)
32 y.o. GYN presents for urinary incontinence, itching all over her body, genital warts.

## 2023-02-04 LAB — COMPREHENSIVE METABOLIC PANEL
ALT: 17 IU/L (ref 0–32)
AST: 17 IU/L (ref 0–40)
Albumin/Globulin Ratio: 1.1 — ABNORMAL LOW (ref 1.2–2.2)
Albumin: 3.8 g/dL — ABNORMAL LOW (ref 4.0–5.0)
Alkaline Phosphatase: 66 IU/L (ref 44–121)
BUN/Creatinine Ratio: 12 (ref 9–23)
BUN: 7 mg/dL (ref 6–20)
Bilirubin Total: <0.2 mg/dL (ref 0.0–1.2)
CO2: 20 mmol/L (ref 20–29)
Calcium: 9.2 mg/dL (ref 8.7–10.2)
Chloride: 104 mmol/L (ref 96–106)
Creatinine, Ser: 0.6 mg/dL (ref 0.57–1.00)
Globulin, Total: 3.5 g/dL (ref 1.5–4.5)
Glucose: 79 mg/dL (ref 70–99)
Potassium: 4.2 mmol/L (ref 3.5–5.2)
Sodium: 139 mmol/L (ref 134–144)
Total Protein: 7.3 g/dL (ref 6.0–8.5)
eGFR: 125 mL/min/{1.73_m2} (ref >59)

## 2023-02-04 LAB — HEPATITIS B SURFACE ANTIGEN: Hepatitis B Surface Ag: NEGATIVE

## 2023-02-04 LAB — HEPATITIS C ANTIBODY: Hep C Virus Ab: NONREACTIVE

## 2023-02-04 LAB — HIV ANTIBODY (ROUTINE TESTING W REFLEX): HIV Screen 4th Generation wRfx: NONREACTIVE

## 2023-02-04 LAB — RPR: RPR Ser Ql: NONREACTIVE

## 2023-02-05 LAB — URINE CULTURE

## 2023-02-05 NOTE — Progress Notes (Signed)
  CC: bladder symptoms Subjective:    Patient ID: [name] [name], female    DOB: 1991-04-18, 32 y.o.   MRN: [id number]  HPI 32  yo G5P4 seen for discussion of incontinence.  Pt notes some symptoms of stress incontinence as well as urge incontinence.  (Mixed incontinence).  Pt denies excessive nocturia.  Pt denies excessive caffeine intake.  She does not use diapers or protective undergarments.  Pt c/o body wide pruritus.   Review of Systems     Objective:   Physical Exam Genitourinary:    Comments: Ext genitalia:  WNL, no genital warts noted Vagina WNL Cervix WNL, pap taken in sterile fashion with chaperone present. Urethra WNL, no leakage with valsalva   Vitals:   02/03/23 0959  BP: 132/86  Pulse: 89         Assessment & Plan:   1. Urinary frequency Check UA and urine culture, refer to urogyn  - POCT Urinalysis Dipstick - Urine Culture - Ambulatory referral to Urogynecology  2. Pap smear, as part of routine gynecological examination  - Cytology - PAP( [facility])  3. Routine screening for STI (sexually transmitted infection) Per pt request  - Cervicovaginal ancillary only( [place]) - Hepatitis C Antibody - Hepatitis B Surface AntiGEN - HIV Antibody (routine testing w rflx) - RPR  4. Pruritus Pt advised to try anti histamines such as benadryl or zyrtec Unsure of etiology as there is no external rash or lesions.  Possibly allergic in etiology  - Comprehensive metabolic panel - Bile acids, total    Warden Fillers, MD Faculty Attending, [facility]

## 2023-02-06 LAB — CERVICOVAGINAL ANCILLARY ONLY
Chlamydia: NEGATIVE
Comment: NEGATIVE
Comment: NEGATIVE
Comment: NORMAL
Neisseria Gonorrhea: NEGATIVE
Trichomonas: NEGATIVE

## 2023-02-06 LAB — BILE ACIDS, TOTAL: Bile Acids Total: 5.5 umol/L (ref 0.0–10.0)

## 2023-02-07 LAB — CYTOLOGY - PAP
Comment: NEGATIVE
Diagnosis: NEGATIVE
High risk HPV: NEGATIVE

## 2023-06-29 ENCOUNTER — Telehealth: Payer: Medicaid Other | Admitting: Physician Assistant

## 2023-06-29 DIAGNOSIS — N760 Acute vaginitis: Secondary | ICD-10-CM | POA: Diagnosis not present

## 2023-06-29 DIAGNOSIS — T3695XA Adverse effect of unspecified systemic antibiotic, initial encounter: Secondary | ICD-10-CM

## 2023-06-29 DIAGNOSIS — B379 Candidiasis, unspecified: Secondary | ICD-10-CM | POA: Diagnosis not present

## 2023-06-29 DIAGNOSIS — B9689 Other specified bacterial agents as the cause of diseases classified elsewhere: Secondary | ICD-10-CM | POA: Diagnosis not present

## 2023-06-29 MED ORDER — FLUCONAZOLE 150 MG PO TABS
150.0000 mg | ORAL_TABLET | ORAL | 0 refills | Status: DC | PRN
Start: 2023-06-29 — End: 2023-08-14

## 2023-06-29 MED ORDER — CLINDAMYCIN HCL 300 MG PO CAPS
300.0000 mg | ORAL_CAPSULE | Freq: Two times a day (BID) | ORAL | 0 refills | Status: DC
Start: 2023-06-29 — End: 2023-08-06

## 2023-06-29 NOTE — Progress Notes (Signed)
Virtual Visit Consent   [name] [name], you are scheduled for a virtual visit with a [facility] provider today. Just as with appointments in the office, your consent must be obtained to participate. Your consent will be active for this visit and any virtual visit you may have with one of our providers in the next 365 days. If you have a MyChart account, a copy of this consent can be sent to you electronically.  As this is a virtual visit, video technology does not allow for your provider to perform a traditional examination. This may limit your provider's ability to fully assess your condition. If your provider identifies any concerns that need to be evaluated in person or the need to arrange testing (such as labs, EKG, etc.), we will make arrangements to do so. Although advances in technology are sophisticated, we cannot ensure that it will always work on either your end or our end. If the connection with a video visit is poor, the visit may have to be switched to a telephone visit. With either a video or telephone visit, we are not always able to ensure that we have a secure connection.  By engaging in this virtual visit, you consent to the provision of healthcare and authorize for your insurance to be billed (if applicable) for the services provided during this visit. Depending on your insurance coverage, you may receive a charge related to this service.  I need to obtain your verbal consent now. Are you willing to proceed with your visit today? [name] [name] has provided verbal consent on 06/29/2023 for a virtual visit (video or telephone). [name], PA-C  Date: 06/29/2023 3:23 PM  Virtual Visit via Video Note   I, [name], connected with  [name] [name]  ([id number], [birth year]) on 06/29/23 at  3:15 PM EDT by a video-enabled telemedicine application and verified that I am speaking with the correct person using two identifiers.  Location: Patient: Virtual Visit Location Patient:  Home Provider: Virtual Visit Location Provider: Home Office   I discussed the limitations of evaluation and management by telemedicine and the availability of in person appointments. The patient expressed understanding and agreed to proceed.    History of Present Illness: [name] [name] is a 32 y.o. who identifies as a female who was assigned female at birth, and is being seen today for vaginal discharge.  HPI: Vaginal Discharge The patient's primary symptoms include genital itching (mild), a genital odor and vaginal discharge. The patient's pertinent negatives include no genital lesions, missed menses or pelvic pain. This is a new problem. The current episode started 1 to 4 weeks ago. The problem occurs constantly. The problem has been gradually worsening. The pain is mild. She is not pregnant. Associated symptoms include dysuria. Pertinent negatives include no abdominal pain, back pain, chills, fever, flank pain, headaches, nausea, painful intercourse or sore throat. The vaginal discharge was thin, milky, clear and malodorous. There has been no bleeding. Nothing aggravates the symptoms. She has tried nothing for the symptoms. The treatment provided no relief. She is sexually active. No, her partner does not have an STD.     Problems:  Patient Active Problem List   Diagnosis Date Noted   Right medial tibial plateau fracture 11/24/2021   Fetal pericardial effusion affecting management of mother 08/11/2017   Pre-eclampsia 07/31/2017   Group B streptococcus urinary tract infection affecting pregnancy 04/24/2017   Encounter for supervision of normal pregnancy, unspecified, unspecified trimester 04/10/2017   History of gestational hypertension  04/10/2017   Short interval between pregnancies affecting pregnancy, antepartum 04/10/2017   Murmur, cardiac 02/10/2016    Allergies:  Allergies  Allergen Reactions   Metronidazole Nausea And Vomiting   Medications:  Current Outpatient Medications:     clindamycin (CLEOCIN) 300 MG capsule, Take 1 capsule (300 mg total) by mouth 2 (two) times daily., Disp: 14 capsule, Rfl: 0   fluconazole (DIFLUCAN) 150 MG tablet, Take 1 tablet (150 mg total) by mouth every 3 (three) days as needed., Disp: 2 tablet, Rfl: 0   imiquimod (ALDARA) 5 % cream, Apply topically 3 (three) times a week., Disp: 12 each, Rfl: 5   metoCLOPramide (REGLAN) 10 MG tablet, Take 1 tablet (10 mg total) by mouth every 6 (six) hours., Disp: 30 tablet, Rfl: 0   metroNIDAZOLE (FLAGYL) 500 MG tablet, Take 1 tablet (500 mg total) by mouth 2 (two) times daily., Disp: 14 tablet, Rfl: 0   ondansetron (ZOFRAN) 4 MG tablet, Take 1 tablet (4 mg total) by mouth every 8 (eight) hours as needed for nausea or vomiting., Disp: 20 tablet, Rfl: 0  Observations/Objective: Patient is well-developed, well-nourished in no acute distress.  Resting comfortably at home.  Head is normocephalic, atraumatic.  No labored breathing.  Speech is clear and coherent with logical content.  Patient is alert and oriented at baseline.    Assessment and Plan: 1. BV (bacterial vaginosis) - clindamycin (CLEOCIN) 300 MG capsule; Take 1 capsule (300 mg total) by mouth 2 (two) times daily.  Dispense: 14 capsule; Refill: 0  2. Antibiotic-induced yeast infection - fluconazole (DIFLUCAN) 150 MG tablet; Take 1 tablet (150 mg total) by mouth every 3 (three) days as needed.  Dispense: 2 tablet; Refill: 0  - Symptoms consistent with BV - Clindamycin prescribed - Limit bubble baths, scented lotions/soaps/detergents - Limit tight fitting clothing - Diflucan given as prophylaxis as patient tends to get vaginal yeast infections with antibiotic use - Seek on person evaluation if not improving or if symptoms worsen   Follow Up Instructions: I discussed the assessment and treatment plan with the patient. The patient was provided an opportunity to ask questions and all were answered. The patient agreed with the plan and  demonstrated an understanding of the instructions.  A copy of instructions were sent to the patient via MyChart unless otherwise noted below.    The patient was advised to call back or seek an in-person evaluation if the symptoms worsen or if the condition fails to improve as anticipated.   [name], PA-C

## 2023-06-29 NOTE — Patient Instructions (Signed)
[name] [name], thank you for joining [name], PA-C for today's virtual visit.  While this provider is not your primary care provider (PCP), if your PCP is located in our provider database this encounter information will be shared with them immediately following your visit.   A [facility] MyChart account gives you access to today's visit and all your visits, tests, and labs performed at [facility] " click here if you don't have a [facility] MyChart account or go to [email or web address]  Consent: (Patient) [name] [name] provided verbal consent for this virtual visit at the beginning of the encounter.  Current Medications:  Current Outpatient Medications:    clindamycin (CLEOCIN) 300 MG capsule, Take 1 capsule (300 mg total) by mouth 2 (two) times daily., Disp: 14 capsule, Rfl: 0   fluconazole (DIFLUCAN) 150 MG tablet, Take 1 tablet (150 mg total) by mouth every 3 (three) days as needed., Disp: 2 tablet, Rfl: 0   imiquimod (ALDARA) 5 % cream, Apply topically 3 (three) times a week., Disp: 12 each, Rfl: 5   metoCLOPramide (REGLAN) 10 MG tablet, Take 1 tablet (10 mg total) by mouth every 6 (six) hours., Disp: 30 tablet, Rfl: 0   metroNIDAZOLE (FLAGYL) 500 MG tablet, Take 1 tablet (500 mg total) by mouth 2 (two) times daily., Disp: 14 tablet, Rfl: 0   ondansetron (ZOFRAN) 4 MG tablet, Take 1 tablet (4 mg total) by mouth every 8 (eight) hours as needed for nausea or vomiting., Disp: 20 tablet, Rfl: 0   Medications ordered in this encounter:  Meds ordered this encounter  Medications   clindamycin (CLEOCIN) 300 MG capsule    Sig: Take 1 capsule (300 mg total) by mouth 2 (two) times daily.    Dispense:  14 capsule    Refill:  0    Order Specific Question:   Supervising Provider    Answer:   Merrilee Jansky [[id number]]   fluconazole (DIFLUCAN) 150 MG tablet    Sig: Take 1 tablet (150 mg total) by mouth every 3 (three) days as needed.    Dispense:  2 tablet    Refill:   0    Order Specific Question:   Supervising Provider    Answer:   Merrilee Jansky [id number]     *If you need refills on other medications prior to your next appointment, please contact your pharmacy*  Follow-Up: Call back or seek an in-person evaluation if the symptoms worsen or if the condition fails to improve as anticipated.  [facility] [phone]  Other Instructions Bacterial Vaginosis  Bacterial vaginosis is an infection that occurs when the normal balance of bacteria in the vagina changes. This change is caused by an overgrowth of certain bacteria in the vagina. Bacterial vaginosis is the most common vaginal infection among females aged 85 to 37 years. This condition increases the risk of sexually transmitted infections (STIs). Treatment can help reduce this risk. Treatment is very important for pregnant women because this condition can cause babies to be born early (prematurely) or at a low birth weight. What are the causes? This condition is caused by an increase in harmful bacteria that are normally present in small amounts in the vagina. However, the exact reason this condition develops is not known. You cannot get bacterial vaginosis from toilet seats, bedding, swimming pools, or contact with objects around you. What increases the risk? The following factors may make you more likely to develop this condition: Having a new sexual partner  or multiple sexual partners, or having unprotected sex. Douching. Having an intrauterine device (IUD). Smoking. Abusing drugs and alcohol. This may lead to riskier sexual behavior. Taking certain antibiotic medicines. Being pregnant. What are the signs or symptoms? Some women with this condition have no symptoms. Symptoms may include: Wallace Cullens or white vaginal discharge. The discharge can be watery or foamy. A fish-like odor with discharge, especially after sex or during menstruation. Itching in and around the  vagina. Burning or pain with urination. How is this diagnosed? This condition is diagnosed based on: Your medical history. A physical exam of the vagina. Checking a sample of vaginal fluid for harmful bacteria or abnormal cells. How is this treated? This condition is treated with antibiotic medicines. These may be given as a pill, a vaginal cream, or a medicine that is put into the vagina (suppository). If the condition comes back after treatment, a second round of antibiotics may be needed. Follow these instructions at home: Medicines Take or apply over-the-counter and prescription medicines only as told by your health care provider. Take or apply your antibiotic medicine as told by your health care provider. Do not stop using the antibiotic even if you start to feel better. General instructions If you have a female sexual partner, tell her that you have a vaginal infection. She should follow up with her health care provider. If you have a female sexual partner, he does not need treatment. Avoid sexual activity until you finish treatment. Drink enough fluid to keep your urine pale yellow. Keep the area around your vagina and rectum clean. Wash the area daily with warm water. Wipe yourself from front to back after using the toilet. If you are breastfeeding, talk to your health care provider about continuing breastfeeding during treatment. Keep all follow-up visits. This is important. How is this prevented? Self-care Do not douche. Wash the outside of your vagina with warm water only. Wear cotton or cotton-lined underwear. Avoid wearing tight pants and pantyhose, especially during the summer. Safe sex Use protection when having sex. This includes: Using condoms. Using dental dams. This is a thin layer of a material made of latex or polyurethane that protects the mouth during oral sex. Limit the number of sexual partners. To help prevent bacterial vaginosis, it is best to have sex with  just one partner (monogamous relationship). Make sure you and your sexual partner are tested for STIs. Drugs and alcohol Do not use any products that contain nicotine or tobacco. These products include cigarettes, chewing tobacco, and vaping devices, such as e-cigarettes. If you need help quitting, ask your health care provider. Do not use drugs. Do not drink alcohol if: Your health care provider tells you not to do this. You are pregnant, may be pregnant, or are planning to become pregnant. If you drink alcohol: Limit how much you have to 0-1 drink a day. Be aware of how much alcohol is in your drink. In the [place] one drink equals one 12 oz bottle of beer (355 mL), one 5 oz glass of wine (148 mL), or one 1 oz glass of hard liquor (44 mL). Where to find more information Centers for Disease Control and Prevention: FootballExhibition.com.br American Sexual Health Association (ASHA): www.ashastd.org U.S. Department of Health and Health and safety inspector, Office on Women's Health: http://hoffman.com/ Contact a health care provider if: Your symptoms do not improve, even after treatment. You have more discharge or pain when urinating. You have a fever or chills. You have pain in your abdomen or pelvis.  You have pain during sex. You have vaginal bleeding between menstrual periods. Summary Bacterial vaginosis is a vaginal infection that occurs when the normal balance of bacteria in the vagina changes. It results from an overgrowth of certain bacteria. This condition increases the risk of sexually transmitted infections (STIs). Getting treated can help reduce this risk. Treatment is very important for pregnant women because this condition can cause babies to be born early (prematurely) or at low birth weight. This condition is treated with antibiotic medicines. These may be given as a pill, a vaginal cream, or a medicine that is put into the vagina (suppository). This information is not intended to replace advice given  to you by your health care provider. Make sure you discuss any questions you have with your health care provider. Document Revised: 03/19/2020 Document Reviewed: 03/19/2020 Elsevier Patient Education  2024 Elsevier Inc.    If you have been instructed to have an in-person evaluation today at a local Urgent Care facility, please use the link below. It will take you to a list of all of our available [facility], including address, phone number and hours of operation. Please do not delay care.  [facility]  If you or a family member do not have a primary care provider, use the link below to schedule a visit and establish care. When you choose a [facility] primary care physician or advanced practice provider, you gain a long-term partner in health. Find a Primary Care Provider  Learn more about [facility]'s in-office and virtual care options: [facility] - Get Care Now

## 2023-08-04 ENCOUNTER — Telehealth: Payer: Medicaid Other | Admitting: Family Medicine

## 2023-08-04 DIAGNOSIS — J029 Acute pharyngitis, unspecified: Secondary | ICD-10-CM | POA: Diagnosis not present

## 2023-08-04 DIAGNOSIS — R051 Acute cough: Secondary | ICD-10-CM

## 2023-08-04 DIAGNOSIS — T3695XA Adverse effect of unspecified systemic antibiotic, initial encounter: Secondary | ICD-10-CM

## 2023-08-04 DIAGNOSIS — B379 Candidiasis, unspecified: Secondary | ICD-10-CM | POA: Diagnosis not present

## 2023-08-04 DIAGNOSIS — H6692 Otitis media, unspecified, left ear: Secondary | ICD-10-CM

## 2023-08-04 MED ORDER — FLUCONAZOLE 150 MG PO TABS: 150.0000 mg | ORAL_TABLET | Freq: Once | ORAL | 0 refills | Status: AC

## 2023-08-04 MED ORDER — CEFDINIR 300 MG PO CAPS: 300.0000 mg | ORAL_CAPSULE | Freq: Two times a day (BID) | ORAL | 0 refills | Status: DC

## 2023-08-04 MED ORDER — PROMETHAZINE-DM 6.25-15 MG/5ML PO SYRP: 5.0000 mL | ORAL_SOLUTION | Freq: Four times a day (QID) | ORAL | 0 refills | Status: DC | PRN

## 2023-08-04 NOTE — Progress Notes (Signed)
Virtual Visit Consent   [name] [name], you are scheduled for a virtual visit with a [facility] provider today. Just as with appointments in the office, your consent must be obtained to participate. Your consent will be active for this visit and any virtual visit you may have with one of our providers in the next 365 days. If you have a MyChart account, a copy of this consent can be sent to you electronically.  As this is a virtual visit, video technology does not allow for your provider to perform a traditional examination. This may limit your provider's ability to fully assess your condition. If your provider identifies any concerns that need to be evaluated in person or the need to arrange testing (such as labs, EKG, etc.), we will make arrangements to do so. Although advances in technology are sophisticated, we cannot ensure that it will always work on either your end or our end. If the connection with a video visit is poor, the visit may have to be switched to a telephone visit. With either a video or telephone visit, we are not always able to ensure that we have a secure connection.  By engaging in this virtual visit, you consent to the provision of healthcare and authorize for your insurance to be billed (if applicable) for the services provided during this visit. Depending on your insurance coverage, you may receive a charge related to this service.  I need to obtain your verbal consent now. Are you willing to proceed with your visit today? [name] [name] has provided verbal consent on 08/04/2023 for a virtual visit (video or telephone). Georgana Curio, FNP  Date: 08/04/2023 6:02 PM  Virtual Visit via Video Note   I, Georgana Curio, connected with  [name] [name]  ([id number], 09-14-1991) on 08/04/23 at  6:00 PM EDT by a video-enabled telemedicine application and verified that I am speaking with the correct person using two identifiers.  Location: Patient: Virtual Visit Location Patient: Home Provider:  Virtual Visit Location Provider: Home Office   I discussed the limitations of evaluation and management by telemedicine and the availability of in person appointments. The patient expressed understanding and agreed to proceed.    History of Present Illness: [name] [name] is a 32 y.o. who identifies as a female who was assigned female at birth, and is being seen today for left ear pain, sore throat and left ear pain for over 7 days worsening. No fever. She also reports a cough keeping her up at night. Marland Kitchen  HPI: HPI  Problems:  Patient Active Problem List   Diagnosis Date Noted   Right medial tibial plateau fracture 11/24/2021   Fetal pericardial effusion affecting management of mother 08/11/2017   Pre-eclampsia 07/31/2017   Group B streptococcus urinary tract infection affecting pregnancy 04/24/2017   Encounter for supervision of normal pregnancy, unspecified, unspecified trimester 04/10/2017   History of gestational hypertension 04/10/2017   Short interval between pregnancies affecting pregnancy, antepartum 04/10/2017   Murmur, cardiac 02/10/2016    Allergies:  Allergies  Allergen Reactions   Metronidazole Nausea And Vomiting   Medications:  Current Outpatient Medications:    clindamycin (CLEOCIN) 300 MG capsule, Take 1 capsule (300 mg total) by mouth 2 (two) times daily., Disp: 14 capsule, Rfl: 0   fluconazole (DIFLUCAN) 150 MG tablet, Take 1 tablet (150 mg total) by mouth every 3 (three) days as needed., Disp: 2 tablet, Rfl: 0   imiquimod (ALDARA) 5 % cream, Apply topically 3 (three) times a week., Disp: 12  each, Rfl: 5   metoCLOPramide (REGLAN) 10 MG tablet, Take 1 tablet (10 mg total) by mouth every 6 (six) hours., Disp: 30 tablet, Rfl: 0   metroNIDAZOLE (FLAGYL) 500 MG tablet, Take 1 tablet (500 mg total) by mouth 2 (two) times daily., Disp: 14 tablet, Rfl: 0   ondansetron (ZOFRAN) 4 MG tablet, Take 1 tablet (4 mg total) by mouth every 8 (eight) hours as needed for nausea or  vomiting., Disp: 20 tablet, Rfl: 0  Observations/Objective: Patient is well-developed, well-nourished in no acute distress.  Resting comfortably  at home.  Head is normocephalic, atraumatic.  No labored breathing.  Speech is clear and coherent with logical content.  Patient is alert and oriented at baseline.    Assessment and Plan: 1. Left otitis media, unspecified otitis media type  2. Pharyngitis, unspecified etiology  3. Acute cough  Increase fluids, warm salt water gargles, ibuprofen as directed, UC if sx worsen.   Follow Up Instructions: I discussed the assessment and treatment plan with the patient. The patient was provided an opportunity to ask questions and all were answered. The patient agreed with the plan and demonstrated an understanding of the instructions.  A copy of instructions were sent to the patient via MyChart unless otherwise noted below.     The patient was advised to call back or seek an in-person evaluation if the symptoms worsen or if the condition fails to improve as anticipated.    Georgana Curio, FNP

## 2023-08-04 NOTE — Patient Instructions (Signed)

## 2023-08-06 ENCOUNTER — Emergency Department (HOSPITAL_COMMUNITY): Payer: Medicaid Other

## 2023-08-06 ENCOUNTER — Emergency Department (HOSPITAL_COMMUNITY)
Admission: EM | Admit: 2023-08-06 | Discharge: 2023-08-06 | Disposition: A | Discharge status: Home / Self Care | Payer: Medicaid Other | Attending: Emergency Medicine | Admitting: Emergency Medicine

## 2023-08-06 DIAGNOSIS — M47812 Spondylosis without myelopathy or radiculopathy, cervical region: Secondary | ICD-10-CM | POA: Diagnosis not present

## 2023-08-06 DIAGNOSIS — J36 Peritonsillar abscess: Secondary | ICD-10-CM | POA: Diagnosis not present

## 2023-08-06 DIAGNOSIS — D72829 Elevated white blood cell count, unspecified: Secondary | ICD-10-CM | POA: Insufficient documentation

## 2023-08-06 DIAGNOSIS — E8809 Other disorders of plasma-protein metabolism, not elsewhere classified: Secondary | ICD-10-CM | POA: Diagnosis not present

## 2023-08-06 DIAGNOSIS — D649 Anemia, unspecified: Secondary | ICD-10-CM | POA: Insufficient documentation

## 2023-08-06 DIAGNOSIS — F17200 Nicotine dependence, unspecified, uncomplicated: Secondary | ICD-10-CM | POA: Insufficient documentation

## 2023-08-06 DIAGNOSIS — R944 Abnormal results of kidney function studies: Secondary | ICD-10-CM | POA: Insufficient documentation

## 2023-08-06 DIAGNOSIS — J351 Hypertrophy of tonsils: Secondary | ICD-10-CM | POA: Diagnosis not present

## 2023-08-06 DIAGNOSIS — J029 Acute pharyngitis, unspecified: Secondary | ICD-10-CM | POA: Diagnosis present

## 2023-08-06 DIAGNOSIS — R590 Localized enlarged lymph nodes: Secondary | ICD-10-CM | POA: Diagnosis not present

## 2023-08-06 LAB — CBC WITH DIFFERENTIAL/PLATELET
Abs Immature Granulocytes: 0.04 10*3/uL (ref 0.00–0.07)
Basophils Absolute: 0.1 10*3/uL (ref 0.0–0.1)
Basophils Relative: 0 %
Eosinophils Absolute: 0.1 10*3/uL (ref 0.0–0.5)
Eosinophils Relative: 1 %
HCT: 38.7 % (ref 36.0–46.0)
Hemoglobin: 11.9 g/dL — ABNORMAL LOW (ref 12.0–15.0)
Immature Granulocytes: 0 %
Lymphocytes Relative: 16 %
Lymphs Abs: 2.5 10*3/uL (ref 0.7–4.0)
MCH: 22.5 pg — ABNORMAL LOW (ref 26.0–34.0)
MCHC: 30.7 g/dL (ref 30.0–36.0)
MCV: 73 fL — ABNORMAL LOW (ref 80.0–100.0)
Monocytes Absolute: 1.1 10*3/uL — ABNORMAL HIGH (ref 0.1–1.0)
Monocytes Relative: 7 %
Neutro Abs: 11.4 10*3/uL — ABNORMAL HIGH (ref 1.7–7.7)
Neutrophils Relative %: 76 %
Platelets: 431 10*3/uL — ABNORMAL HIGH (ref 150–400)
RBC: 5.3 MIL/uL — ABNORMAL HIGH (ref 3.87–5.11)
RDW: 15.9 % — ABNORMAL HIGH (ref 11.5–15.5)
WBC: 15.1 10*3/uL — ABNORMAL HIGH (ref 4.0–10.5)
nRBC: 0 % (ref 0.0–0.2)

## 2023-08-06 LAB — GROUP A STREP BY PCR: Group A Strep by PCR: NOT DETECTED

## 2023-08-06 LAB — COMPREHENSIVE METABOLIC PANEL
ALT: 16 U/L (ref 0–44)
AST: 13 U/L — ABNORMAL LOW (ref 15–41)
Albumin: 3.8 g/dL (ref 3.5–5.0)
Alkaline Phosphatase: 49 U/L (ref 38–126)
Anion gap: 11 (ref 5–15)
BUN: 5 mg/dL — ABNORMAL LOW (ref 6–20)
CO2: 24 mmol/L (ref 22–32)
Calcium: 9 mg/dL (ref 8.9–10.3)
Chloride: 104 mmol/L (ref 98–111)
Creatinine, Ser: 0.59 mg/dL (ref 0.44–1.00)
GFR, Estimated: >60 mL/min (ref >60)
Glucose, Bld: 93 mg/dL (ref 70–99)
Potassium: 3.7 mmol/L (ref 3.5–5.1)
Sodium: 139 mmol/L (ref 135–145)
Total Bilirubin: 0.5 mg/dL (ref 0.3–1.2)
Total Protein: 8.6 g/dL — ABNORMAL HIGH (ref 6.5–8.1)

## 2023-08-06 LAB — MONONUCLEOSIS SCREEN: Mono Screen: NEGATIVE

## 2023-08-06 LAB — LACTIC ACID, PLASMA: Lactic Acid, Venous: 1 mmol/L (ref 0.5–1.9)

## 2023-08-06 MED ORDER — IOHEXOL 300 MG/ML  SOLN
75.0000 mL | Freq: Once | INTRAMUSCULAR | Status: AC | PRN
  Administered 2023-08-06: 75 mL via INTRAVENOUS

## 2023-08-06 MED ORDER — METHYLPREDNISOLONE SODIUM SUCC 125 MG IJ SOLR
125.0000 mg | Freq: Once | INTRAMUSCULAR | Status: AC
Start: 2023-08-06 — End: 2023-08-06
  Administered 2023-08-06: 125 mg via INTRAVENOUS
  Filled 2023-08-06: qty 2

## 2023-08-06 MED ORDER — LIDOCAINE VISCOUS HCL 2 % MT SOLN: 15.0000 mL | OROMUCOSAL | 0 refills | Status: DC | PRN

## 2023-08-06 MED ORDER — KETOROLAC TROMETHAMINE 15 MG/ML IJ SOLN
15.0000 mg | Freq: Once | INTRAMUSCULAR | Status: AC
Start: 2023-08-06 — End: 2023-08-06
  Administered 2023-08-06: 15 mg via INTRAVENOUS
  Filled 2023-08-06: qty 1

## 2023-08-06 MED ORDER — AMOXICILLIN-POT CLAVULANATE 875-125 MG PO TABS: 1.0000 | ORAL_TABLET | Freq: Two times a day (BID) | ORAL | 0 refills | Status: DC

## 2023-08-06 NOTE — ED Notes (Signed)
POC Urine Pregnancy resulted as negative (patient MRN [id number]). Result are having issues transferring to chart via the [facility] at this time.

## 2023-08-06 NOTE — ED Triage Notes (Signed)
Patient in today reporting ongoing sore throat. Reports tele visit with no relief. Patient reports that she has been on antibiotics and promethazine with no relief. Does not remember name of antibiotic and it cannot be located in chart.

## 2023-08-06 NOTE — Discharge Instructions (Addendum)
You were seen in the clinic today for evaluation of your sore throat. You have chosen for [place] not to drain the area.  I made a discharge you home on Augmentin which will take twice daily for the next 10 days.  I looked in your chart you have had penicillin multiple times in the past, do not think that you have an allergy to this given that if you do not any reaction to it back then.  I am also to send you home with some viscous lidocaine to get help your sore throat.  Please make sure you are staying well-hydrated drinking plenty of fluids, mainly water.  For pain, recommend taking 1000 mg of Tylenol and/or 600 mg of ibuprofen every 6 hours as needed.  If you start to have any worsening sore throat or you change your mind about drainage, please return to the nearest emergency department.  If you have any fever, worsening pain, worsening swelling, trouble breathing, trouble swallowing, neck swelling, neck stiffness, please return to your nearest emerged part for reevaluation.  If you have any other concerns, new or worsening symptoms, please return to your nearest emergency department for evaluation.  Contact a doctor if: You have more pain, swelling, redness, or pus in your throat. You have a headache. You have low energy or feel generally sick. You have a fever or chills. You have trouble swallowing or eating. You have signs of not enough water in the body (dehydration), such as: Feeling light-headed or dizzy when you are standing. Peeing (urinating) less than usual. A fast heart rate. Dry mouth. Get help right away if: You are unable to swallow. You have trouble breathing. Breathing is easier when you lean forward. You cough up blood. You vomit blood. You have very bad throat pain and it does not get better with medicine. These symptoms may be an emergency. Get help right away. Call your local emergency services (911 in the [place] Do not wait to see if the symptoms will go away. Do not drive  yourself to the hospital.

## 2023-08-06 NOTE — ED Provider Notes (Signed)
[facility] EMERGENCY DEPARTMENT AT [facility] Provider Note   CSN: [id number] Arrival date & time: 08/06/23  1011     History Chief Complaint  Patient presents with   Sore Throat    [name] [name] is a 32 y.o. female reportedly otherwise healthy presents to the ER for evaluation for sore throat of the past 9 days. She reports that she initially had chest congestion and cough for the first few days, but that subsided. She reports that she has had a persistent sore throat since then. Denies any nasal congestion, rhinorrhea, fever, chest pain, SOB, rash, or trouble swallowing. She reports that her daughter had mono a few weeks ago and was concerned. She had a telemedicine visit and was put on Cefdinir and promethazine, she reports two days of doses without change in symptoms.  She denies any medical or surgical history.  Denies any daily medications.  Allergic to penicillin and Flagyl.  Half pack per day smoker.   Sore Throat Pertinent negatives include no chest pain, no abdominal pain and no shortness of breath.       Home Medications Prior to Admission medications   Medication Sig Start Date End Date Taking? Authorizing Provider  cefdinir (OMNICEF) 300 MG capsule Take 1 capsule (300 mg total) by mouth 2 (two) times daily for 10 days. 08/04/23 08/14/23  Delorse Lek, FNP  clindamycin (CLEOCIN) 300 MG capsule Take 1 capsule (300 mg total) by mouth 2 (two) times daily. 06/29/23   [name], PA-C  fluconazole (DIFLUCAN) 150 MG tablet Take 1 tablet (150 mg total) by mouth every 3 (three) days as needed. 06/29/23   [name], PA-C  imiquimod (ALDARA) 5 % cream Apply topically 3 (three) times a week. 09/23/22   Brock Bad, MD  metoCLOPramide (REGLAN) 10 MG tablet Take 1 tablet (10 mg total) by mouth every 6 (six) hours. 01/12/22   Brand Males, CNM  metroNIDAZOLE (FLAGYL) 500 MG tablet Take 1 tablet (500 mg total) by mouth 2 (two) times daily.  07/13/22   Jannifer Rodney A, FNP  ondansetron (ZOFRAN) 4 MG tablet Take 1 tablet (4 mg total) by mouth every 8 (eight) hours as needed for nausea or vomiting. 07/13/22   Jannifer Rodney A, FNP  promethazine-dextromethorphan (PROMETHAZINE-DM) 6.25-15 MG/5ML syrup Take 5 mLs by mouth 4 (four) times daily as needed for cough. 08/04/23   Delorse Lek, FNP      Allergies    Metronidazole    Review of Systems   Review of Systems  Constitutional:  Negative for chills and fever.  HENT:  Positive for sore throat. Negative for congestion, rhinorrhea and trouble swallowing.   Respiratory:  Negative for shortness of breath.   Cardiovascular:  Negative for chest pain.  Gastrointestinal:  Negative for abdominal pain, nausea and vomiting.    Physical Exam Updated Vital Signs BP (!) 145/94 (BP Location: Left Arm)   Pulse 93   Temp 98.5 F (36.9 C) (Oral)   Resp 18   Ht 5\' 6"  (1.676 m)   Wt 114.3 kg   SpO2 100%   BMI 40.66 kg/m  Physical Exam Vitals and nursing note reviewed.  Constitutional:      General: She is not in acute distress.    Appearance: She is not toxic-appearing.  HENT:     Right Ear: Tympanic membrane and ear canal normal.     Left Ear: Tympanic membrane and ear canal normal.     Mouth/Throat:  Mouth: Mucous membranes are moist.     Pharynx: Posterior oropharyngeal erythema present. No oropharyngeal exudate or uvula swelling.     Comments: Slight increase in fullness to the left side tonsil/oropharyngeal space.  Some mild increase in erythema but no exudate noted.  Uvula mostly midline.  Airway patent.  No sublingual elevation.  No trismus.  Controlling secretions. Neck:     Comments: Left-sided tender cervical lymphadenopathy in the subtonsillar/submandibular area.  No neck swelling noted. Cardiovascular:     Rate and Rhythm: Normal rate.  Pulmonary:     Effort: Pulmonary effort is normal. No respiratory distress.     Breath sounds: Normal breath sounds. No stridor.  No wheezing.  Musculoskeletal:     Cervical back: Normal range of motion.  Lymphadenopathy:     Cervical: Cervical adenopathy present.  Skin:    General: Skin is warm and dry.  Neurological:     Mental Status: She is alert.     ED Results / Procedures / Treatments   Labs (all labs ordered are listed, but only abnormal results are displayed) Labs Reviewed  CBC WITH DIFFERENTIAL/PLATELET - Abnormal; Notable for the following components:      Result Value   WBC 15.1 (*)    RBC 5.30 (*)    Hemoglobin 11.9 (*)    MCV 73.0 (*)    MCH 22.5 (*)    RDW 15.9 (*)    Platelets 431 (*)    Neutro Abs 11.4 (*)    Monocytes Absolute 1.1 (*)    All other components within normal limits  COMPREHENSIVE METABOLIC PANEL - Abnormal; Notable for the following components:   BUN 5 (*)    Total Protein 8.6 (*)    AST 13 (*)    All other components within normal limits  GROUP A STREP BY PCR  MONONUCLEOSIS SCREEN  LACTIC ACID, PLASMA  POC URINE PREG, ED    EKG None  Radiology CT Soft Tissue Neck W Contrast  Result Date: 08/06/2023 CLINICAL DATA:  Initial evaluation for acute epiglottitis or tonsillitis. EXAM: CT NECK WITH CONTRAST TECHNIQUE: Multidetector CT imaging of the neck was performed using the standard protocol following the bolus administration of intravenous contrast. RADIATION DOSE REDUCTION: This exam was performed according to the departmental dose-optimization program which includes automated exposure control, adjustment of the mA and/or kV according to patient size and/or use of iterative reconstruction technique. CONTRAST:  75mL OMNIPAQUE IOHEXOL 300 MG/ML  SOLN COMPARISON:  None Available. FINDINGS: Pharynx and larynx: Oral cavity within normal limits. Asymmetric enlargement of the left palatine tonsil, consistent with acute tonsillitis. Superimposed hypodense collection measuring 2.1 x 1.3 x 1.5 cm, consistent with tonsillar/peritonsillar abscess (series 3, image 42). Mucosal edema  within the adjacent left pharynx compatible with associated pharyngitis. Small layering retropharyngeal effusion, likely reactive. No loculated retropharyngeal abscess. Epiglottis within normal limits. Supraglottic airway remains widely patent. Negative glottis. Subglottic airway clear. Salivary glands: Salivary glands including the parotid and submandibular glands are within normal limits. Thyroid: Normal. Lymph nodes: Enlarged left upper cervical lymph nodes, largest of which measures 1.5 cm at level 2, presumably reactive. Vascular: Normal intravascular enhancement seen within the neck. Limited intracranial: Unremarkable. Visualized orbits: Unremarkable. Mastoids and visualized paranasal sinuses: Mild mucosal thickening about the left sphenoid sinus. Visualized paranasal sinuses are otherwise largely clear. Mastoid air cells and middle ear cavities are well pneumatized and free of fluid. Skeleton: No discrete or worrisome osseous lesions. Mild spondylosis at C4-5 and C5-6 noted. Upper chest: No  acute finding. Other: None. IMPRESSION: 1. Findings consistent with acute left-sided tonsillitis/pharyngitis, with superimposed 2.1 x 1.3 x 1.5 cm left tonsillar/peritonsillar abscess. 2. Small layering retropharyngeal effusion, likely reactive. No loculated retropharyngeal abscess. 3. Enlarged left upper cervical lymph nodes, presumably reactive. Electronically Signed   By: Rise Mu M.D.   On: 08/06/2023 17:47    Procedures Procedures   Medications Ordered in ED Medications  methylPREDNISolone sodium succinate (SOLU-MEDROL) 125 mg/2 mL injection 125 mg (125 mg Intravenous Given 08/06/23 1401)  ketorolac (TORADOL) 15 MG/ML injection 15 mg (15 mg Intravenous Given 08/06/23 1402)  iohexol (OMNIPAQUE) 300 MG/ML solution 75 mL (75 mLs Intravenous Contrast Given 08/06/23 1535)    ED Course/ Medical Decision Making/ A&P   Medical Decision Making Amount and/or Complexity of Data Reviewed Labs:  ordered. Radiology: ordered.  Risk Prescription drug management.   32 y.o. female presents to the ER for evaluation of sore throat. Differential diagnosis includes but is not limited to Viral pharyngitis, strep pharyngitis, dental caries/abscess, esophagitis, sinusitis, post nasal drip, reflux, angioedema, RTA/PTA, Ludwig's angina. Vital signs mildly elevated blood pressure otherwise unremarkable. Physical exam as noted above.   Patient controlling secretions.  No stridor or wheezing.  Does have some more left-sided fullness, given she reports increase in pain and symptoms are going on for greater than a week, will order CT imaging and labs.  Toradol and Solu-Medrol ordered for symptoms.  I independently reviewed and interpreted the patient's labs.  CMP shows BUN of 5, total protein at 8.6 and AST at 13 otherwise unremarkable.  No other electrolyte or LFT dramality's.  Lactic acid within normal limits.  Mono negative.  Strep negative.  CBC shows leukocytosis at 15.1 with a left shift as well.  Slightly elevated platelet count as well as 431.  Anemia with hemoglobin 11.9 although appears to be around patient's baseline.  Pregnancy test is negative however did not crossover.  Was informed by mini lab tech.  CT shows  1. Findings consistent with acute left-sided tonsillitis/pharyngitis, with superimposed 2.1 x 1.3 x 1.5 cm left tonsillar/peritonsillar abscess. 2. Small layering retropharyngeal effusion, likely reactive. No loculated retropharyngeal abscess. 3. Enlarged left upper cervical lymph nodes, presumably reactive.   I consulted ENT and spoke with Dr. Jearld Fenton.  He reports that plus or minus drainage of this.  Will give this decision to the patient.  Wants treatment with Augmentin.  No other recommendations.  On reevaluation, patient reports that she is feeling significantly better after the medications she received. I discussed with her the recommendations with ENT for I&D versus just antibiotics.  The patient reports that she is feeling much better and would like to just to the antibiotics if possible. Will PO challenge the patient.  The patient has passed PO challenge without any difficulty. She reports she is feeling better and would not like to proceed with the I&D. Will discharge her home on antibiotics. She has had PCN in the past without issue. Recommended follow up with ENT and gave strict return precautions.   We discussed the results of the labs/imaging. The plan is take antibiotics as prescribed and follow up with ENT outpatient. We discussed strict return precautions and red flag symptoms. The patient verbalized their understanding and agrees to the plan. The patient is stable and being discharged home in good condition.  Portions of this report may have been transcribed using voice recognition software. Every effort was made to ensure accuracy; however, inadvertent computerized transcription errors may be present.  Final Clinical Impression(s) / ED Diagnoses Final diagnoses:  Peritonsillar abscess    Rx / DC Orders ED Discharge Orders          Ordered    amoxicillin-clavulanate (AUGMENTIN) 875-125 MG tablet  Every 12 hours,   Status:  Discontinued        08/06/23 1957    amoxicillin-clavulanate (AUGMENTIN) 875-125 MG tablet  Every 12 hours        08/06/23 2001    lidocaine (XYLOCAINE) 2 % solution  As needed        08/06/23 2005              Achille Rich, [place] 08/06/23 2127    Tegeler, Canary Brim, MD 08/08/23 [phone]

## 2023-08-07 LAB — POC URINE PREG, ED: Preg Test, Ur: NEGATIVE

## 2023-08-08 ENCOUNTER — Telehealth: Payer: Self-pay

## 2023-08-08 NOTE — Telephone Encounter (Signed)
Lvm for patient to schedule ED Consult Follow-Up.

## 2023-08-12 ENCOUNTER — Other Ambulatory Visit: Payer: Self-pay

## 2023-08-12 ENCOUNTER — Encounter (HOSPITAL_COMMUNITY): Payer: Self-pay

## 2023-08-12 ENCOUNTER — Emergency Department (HOSPITAL_COMMUNITY)
Admission: EM | Admit: 2023-08-12 | Discharge: 2023-08-12 | Discharge status: Against Medical Advice | Payer: Medicaid Other | Attending: Emergency Medicine | Admitting: Emergency Medicine

## 2023-08-12 ENCOUNTER — Ambulatory Visit (HOSPITAL_COMMUNITY)
Admission: RE | Admit: 2023-08-12 | Discharge: 2023-08-12 | Disposition: A | Discharge status: Home / Self Care | Payer: Medicaid Other | Source: Ambulatory Visit | Attending: Family Medicine | Admitting: Family Medicine

## 2023-08-12 ENCOUNTER — Encounter (HOSPITAL_COMMUNITY): Payer: Self-pay | Admitting: Emergency Medicine

## 2023-08-12 VITALS — BP 134/85 | HR 82 | Temp 98.5°F | Resp 16 | Ht 66.0 in | Wt 250.0 lb

## 2023-08-12 DIAGNOSIS — J36 Peritonsillar abscess: Secondary | ICD-10-CM | POA: Insufficient documentation

## 2023-08-12 DIAGNOSIS — H9202 Otalgia, left ear: Secondary | ICD-10-CM | POA: Insufficient documentation

## 2023-08-12 DIAGNOSIS — Z5321 Procedure and treatment not carried out due to patient leaving prior to being seen by health care provider: Secondary | ICD-10-CM | POA: Insufficient documentation

## 2023-08-12 NOTE — ED Triage Notes (Signed)
Abscess in the throat are. States she was seen at the hospital Sunday and was told there was a small abscess on the tonsils. Was given abx and a steroid. Some relief of the pain but still there and with swelling.

## 2023-08-12 NOTE — ED Provider Notes (Signed)
[facility]    CSN: [id number] Arrival date & time: 08/12/23  1648      History   Chief Complaint Chief Complaint  Patient presents with   Abscess   Appointment    HPI [name] [name] is a 32 y.o. female.    Abscess Here for continued throat pain and swelling in her throat and tonsils.  She was seen in the emergency room on November 3.  At that time a peritonsillar abscess that was about 2 cm in diameter was noted on CT.  ENT was consulted and they discussed options with the patient.  She elected to try oral antibiotics instead of having the abscess drained.  She has now been on Augmentin for 6 days and continues to have pain and swelling and it is hard to open her mouth.   Past Medical History:  Diagnosis Date   Anemia    Anxiety    Chlamydia 10/2011   Cholecystitis    Closed head injury with concussion    Gestational hypertension    Headache(784.0)    Heart palpitations    Hypertension     Patient Active Problem List   Diagnosis Date Noted   Right medial tibial plateau fracture 11/24/2021   Fetal pericardial effusion affecting management of mother 08/11/2017   Pre-eclampsia 07/31/2017   Group B streptococcus urinary tract infection affecting pregnancy 04/24/2017   Encounter for supervision of normal pregnancy, unspecified, unspecified trimester 04/10/2017   History of gestational hypertension 04/10/2017   Short interval between pregnancies affecting pregnancy, antepartum 04/10/2017   Murmur, cardiac 02/10/2016    Past Surgical History:  Procedure Laterality Date   ORIF ANKLE FRACTURE Left 02/12/2016   Procedure: OPEN REDUCTION INTERNAL FIXATION (ORIF) ANKLE FRACTURE;  Surgeon: Myrene Galas, MD;  Location: [facility] OR;  Service: Orthopedics;  Laterality: Left;   ORIF TIBIA PLATEAU Right 11/26/2021   Procedure: OPEN REDUCTION INTERNAL FIXATION (ORIF) TIBIAL PLATEAU;  Surgeon: Roby Lofts, MD;  Location: [facility] OR;  Service: Orthopedics;  Laterality: Right;     OB History     Gravida  5   Para  4   Term  3   Preterm  1   AB  0   Living  4      SAB  0   IAB  0   Ectopic  0   Multiple  0   Live Births  4            Home Medications    Prior to Admission medications   Medication Sig Start Date End Date Taking? Authorizing Provider  amoxicillin-clavulanate (AUGMENTIN) 875-125 MG tablet Take 1 tablet by mouth every 12 (twelve) hours. 08/06/23  Yes Achille Rich, PA-C  ondansetron (ZOFRAN) 4 MG tablet Take 1 tablet (4 mg total) by mouth every 8 (eight) hours as needed for nausea or vomiting. 07/13/22  Yes Hawks, Christy A, FNP  imiquimod (ALDARA) 5 % cream Apply topically 3 (three) times a week. 09/23/22   Brock Bad, MD  metoCLOPramide (REGLAN) 10 MG tablet Take 1 tablet (10 mg total) by mouth every 6 (six) hours. 01/12/22   Brand Males, CNM    Family History Family History  Problem Relation Age of Onset   Diabetes Maternal Grandmother    Hypertension Maternal Grandmother    Cancer Maternal Grandmother    Hypertension Mother    Anesthesia problems Neg Hx    Other Neg Hx     Social History Social History  Tobacco Use   Smoking status: Every Day    Current packs/day: 0.50    Average packs/day: 0.5 packs/day for 10.0 years (5.0 ttl pk-yrs)    Types: Cigarettes   Smokeless tobacco: Never  Vaping Use   Vaping status: Never Used  Substance Use Topics   Alcohol use: No    Comment: socially   Drug use: No     Allergies   Metronidazole   Review of Systems Review of Systems   Physical Exam Triage Vital Signs ED Triage Vitals  Encounter Vitals Group     BP 08/12/23 1708 134/85     Systolic BP Percentile --      Diastolic BP Percentile --      Pulse Rate 08/12/23 1708 82     Resp 08/12/23 1708 16     Temp 08/12/23 1708 98.5 F (36.9 C)     Temp Source 08/12/23 1708 Oral     SpO2 08/12/23 1708 97 %     Weight 08/12/23 1707 250 lb (113.4 kg)     Height 08/12/23 1707 5\' 6"  (1.676  m)     Head Circumference --      Peak Flow --      Pain Score 08/12/23 1706 9     Pain Loc --      Pain Education --      Exclude from Growth Chart --    No data found.  Updated Vital Signs BP 134/85 (BP Location: Left Arm)   Pulse 82   Temp 98.5 F (36.9 C) (Oral)   Resp 16   Ht 5\' 6"  (1.676 m)   Wt 113.4 kg   LMP 07/28/2023 (Approximate)   SpO2 97%   Breastfeeding No   BMI 40.35 kg/m   Visual Acuity Right Eye Distance:   Left Eye Distance:   Bilateral Distance:    Right Eye Near:   Left Eye Near:    Bilateral Near:     Physical Exam Vitals reviewed.  Constitutional:      General: She is not in acute distress.    Appearance: She is not ill-appearing, toxic-appearing or diaphoretic.  HENT:     Mouth/Throat:     Mouth: Mucous membranes are moist.     Comments: She is unable to open her mouth enough for me to see her oropharynx. Neurological:     Mental Status: She is alert and oriented to person, place, and time.  Psychiatric:        Behavior: Behavior normal.      UC Treatments / Results  Labs (all labs ordered are listed, but only abnormal results are displayed) Labs Reviewed - No data to display  EKG   Radiology No results found.  Procedures Procedures (including critical care time)  Medications Ordered in UC Medications - No data to display  Initial Impression / Assessment and Plan / UC Course  I have reviewed the triage vital signs and the nursing notes.  Pertinent labs & imaging results that were available during my care of the patient were reviewed by me and considered in my medical decision making (see chart for details).     I have asked the patient to proceed to the emergency room.  Since we have a documented abscess that she elected not to have drained and she is not improved after 6 days of antibiotics, I think she needs to be evaluated on an urgent basis for possible more definitive treatment.  She will proceed to the ER  Final  Clinical Impressions(s) / UC Diagnoses   Final diagnoses:  Peritonsillar abscess     Discharge Instructions      Pt is to go to the ER     ED Prescriptions   None    PDMP not reviewed this encounter.   Zenia Resides, MD 08/12/23 1718    Zenia Resides, MD 08/14/23 [phone]

## 2023-08-12 NOTE — ED Notes (Signed)
Patient is being discharged from the Urgent Care and sent to the Emergency Department via POV . Per Dr Marlinda Mike, patient is in need of higher level of care due to Tonsillar abscess. Patient is aware and verbalizes understanding of plan of care.  Vitals:   08/12/23 1708  BP: 134/85  Pulse: 82  Resp: 16  Temp: 98.5 F (36.9 C)  SpO2: 97%

## 2023-08-12 NOTE — ED Triage Notes (Signed)
Patient arrives ambulatory by POV c/o painful to swallow and pain to left ear. States she was here Sunday and diagnosed with peritonsillar abscess but chose to go home with oral antibiotics and not have it drained. Patient states she is ready to have it drained now.

## 2023-08-14 NOTE — Discharge Instructions (Addendum)
Pt is to go to the ER

## 2023-08-17 ENCOUNTER — Institutional Professional Consult (permissible substitution) (INDEPENDENT_AMBULATORY_CARE_PROVIDER_SITE_OTHER): Payer: Medicaid Other

## 2023-09-13 ENCOUNTER — Other Ambulatory Visit: Payer: Self-pay

## 2023-09-13 ENCOUNTER — Encounter (HOSPITAL_COMMUNITY): Payer: Self-pay

## 2023-09-13 ENCOUNTER — Emergency Department (HOSPITAL_COMMUNITY)
Admission: EM | Admit: 2023-09-13 | Discharge: 2023-09-14 | Disposition: A | Discharge status: Home / Self Care | Payer: Medicaid Other | Attending: Emergency Medicine | Admitting: Emergency Medicine

## 2023-09-13 DIAGNOSIS — J36 Peritonsillar abscess: Secondary | ICD-10-CM | POA: Insufficient documentation

## 2023-09-13 DIAGNOSIS — J029 Acute pharyngitis, unspecified: Secondary | ICD-10-CM | POA: Diagnosis present

## 2023-09-13 LAB — CBC WITH DIFFERENTIAL/PLATELET
Abs Immature Granulocytes: 0.03 10*3/uL (ref 0.00–0.07)
Basophils Absolute: 0 10*3/uL (ref 0.0–0.1)
Basophils Relative: 0 %
Eosinophils Absolute: 0.1 10*3/uL (ref 0.0–0.5)
Eosinophils Relative: 1 %
HCT: 35.6 % — ABNORMAL LOW (ref 36.0–46.0)
Hemoglobin: 11.4 g/dL — ABNORMAL LOW (ref 12.0–15.0)
Immature Granulocytes: 0 %
Lymphocytes Relative: 30 %
Lymphs Abs: 3.4 10*3/uL (ref 0.7–4.0)
MCH: 23.1 pg — ABNORMAL LOW (ref 26.0–34.0)
MCHC: 32 g/dL (ref 30.0–36.0)
MCV: 72.1 fL — ABNORMAL LOW (ref 80.0–100.0)
Monocytes Absolute: 0.7 10*3/uL (ref 0.1–1.0)
Monocytes Relative: 7 %
Neutro Abs: 7 10*3/uL (ref 1.7–7.7)
Neutrophils Relative %: 62 %
Platelets: 434 10*3/uL — ABNORMAL HIGH (ref 150–400)
RBC: 4.94 MIL/uL (ref 3.87–5.11)
RDW: 15.9 % — ABNORMAL HIGH (ref 11.5–15.5)
WBC: 11.3 10*3/uL — ABNORMAL HIGH (ref 4.0–10.5)
nRBC: 0 % (ref 0.0–0.2)

## 2023-09-13 LAB — HCG, SERUM, QUALITATIVE: Preg, Serum: NEGATIVE

## 2023-09-13 LAB — COMPREHENSIVE METABOLIC PANEL
ALT: 14 U/L (ref 0–44)
AST: 13 U/L — ABNORMAL LOW (ref 15–41)
Albumin: 3.6 g/dL (ref 3.5–5.0)
Alkaline Phosphatase: 42 U/L (ref 38–126)
Anion gap: 7 (ref 5–15)
BUN: 10 mg/dL (ref 6–20)
CO2: 20 mmol/L — ABNORMAL LOW (ref 22–32)
Calcium: 8.4 mg/dL — ABNORMAL LOW (ref 8.9–10.3)
Chloride: 106 mmol/L (ref 98–111)
Creatinine, Ser: 0.61 mg/dL (ref 0.44–1.00)
GFR, Estimated: >60 mL/min (ref >60)
Glucose, Bld: 89 mg/dL (ref 70–99)
Potassium: 3.7 mmol/L (ref 3.5–5.1)
Sodium: 133 mmol/L — ABNORMAL LOW (ref 135–145)
Total Bilirubin: 0.3 mg/dL (ref <1.2)
Total Protein: 7.6 g/dL (ref 6.5–8.1)

## 2023-09-13 LAB — MONONUCLEOSIS SCREEN: Mono Screen: NEGATIVE

## 2023-09-13 LAB — I-STAT CG4 LACTIC ACID, ED: Lactic Acid, Venous: 0.8 mmol/L (ref 0.5–1.9)

## 2023-09-13 NOTE — ED Triage Notes (Signed)
Sore throat 2 days ago, worse on the left.  Pt states last time this happened she had an peritonsillar abscess, pt was put on oral abx and abscess went away Pt daughter diagnosed with mono this week

## 2023-09-14 ENCOUNTER — Emergency Department (HOSPITAL_COMMUNITY): Payer: Medicaid Other

## 2023-09-14 ENCOUNTER — Encounter (HOSPITAL_COMMUNITY): Payer: Self-pay

## 2023-09-14 MED ORDER — IOHEXOL 300 MG/ML  SOLN
75.0000 mL | Freq: Once | INTRAMUSCULAR | Status: AC | PRN
  Administered 2023-09-14: 75 mL via INTRAVENOUS

## 2023-09-14 MED ORDER — DEXAMETHASONE SODIUM PHOSPHATE 10 MG/ML IJ SOLN
10.0000 mg | Freq: Once | INTRAMUSCULAR | Status: AC
  Administered 2023-09-14: 10 mg via INTRAVENOUS
  Filled 2023-09-14: qty 1

## 2023-09-14 MED ORDER — SODIUM CHLORIDE 0.9 % IV BOLUS
500.0000 mL | Freq: Once | INTRAVENOUS | Status: AC
  Administered 2023-09-14: 500 mL via INTRAVENOUS

## 2023-09-14 MED ORDER — LIDOCAINE-EPINEPHRINE (PF) 2 %-1:200000 IJ SOLN
10.0000 mL | Freq: Once | INTRAMUSCULAR | Status: DC
  Filled 2023-09-14: qty 20

## 2023-09-14 MED ORDER — CLINDAMYCIN PHOSPHATE 600 MG/50ML IV SOLN
600.0000 mg | Freq: Once | INTRAVENOUS | Status: AC
Start: 2023-09-14 — End: 2023-09-14
  Administered 2023-09-14: 600 mg via INTRAVENOUS
  Filled 2023-09-14: qty 50

## 2023-09-14 MED ORDER — PREDNISONE 20 MG PO TABS: 40.0000 mg | ORAL_TABLET | Freq: Every day | ORAL | 0 refills | Status: DC

## 2023-09-14 MED ORDER — CLINDAMYCIN HCL 150 MG PO CAPS: 300.0000 mg | ORAL_CAPSULE | Freq: Four times a day (QID) | ORAL | 0 refills | Status: DC

## 2023-09-14 MED ORDER — BUTAMBEN-TETRACAINE-BENZOCAINE 2-2-14 % EX AERO
1.0000 | INHALATION_SPRAY | Freq: Once | CUTANEOUS | Status: DC
  Filled 2023-09-14: qty 20

## 2023-09-14 MED ORDER — KETOROLAC TROMETHAMINE 30 MG/ML IJ SOLN
15.0000 mg | Freq: Once | INTRAMUSCULAR | Status: AC
Start: 2023-09-14 — End: 2023-09-14
  Administered 2023-09-14: 15 mg via INTRAVENOUS
  Filled 2023-09-14: qty 1

## 2023-09-14 NOTE — ED Notes (Signed)
Pt returned from CT °

## 2023-09-14 NOTE — ED Provider Notes (Signed)
[place] EMERGENCY DEPARTMENT AT [facility] Provider Note   CSN: [id number] Arrival date & time: 09/13/23  2048     History  Chief Complaint  Patient presents with   Sore Throat    [name] [name] is a 32 y.o. female.  Patient presents with sore throat.  Patient reports that her sore throat started 2 days ago.  Pain is more on the left side than on the right.  Patient reports progressively worsening pain, difficulty opening her mouth.  Patient reports that she had a tonsillar abscess last month that was treated with antibiotics.  She was unable to get an appointment with the follow-up ENT but improved so she did not keep trying.  This feels similar to last month's sore throat.       Home Medications Prior to Admission medications   Medication Sig Start Date End Date Taking? Authorizing Provider  clindamycin (CLEOCIN) 150 MG capsule Take 2 capsules (300 mg total) by mouth 4 (four) times daily. 09/14/23  Yes Columbia Pandey, Canary Brim, MD  predniSONE (DELTASONE) 20 MG tablet Take 2 tablets (40 mg total) by mouth daily with breakfast. 09/14/23  Yes Abdishakur Gottschall, Canary Brim, MD  imiquimod (ALDARA) 5 % cream Apply topically 3 (three) times a week. 09/23/22   Brock Bad, MD  metoCLOPramide (REGLAN) 10 MG tablet Take 1 tablet (10 mg total) by mouth every 6 (six) hours. 01/12/22   Brand Males, CNM  ondansetron (ZOFRAN) 4 MG tablet Take 1 tablet (4 mg total) by mouth every 8 (eight) hours as needed for nausea or vomiting. 07/13/22   Junie Spencer, FNP      Allergies    Metronidazole    Review of Systems   Review of Systems  Physical Exam Updated Vital Signs BP (!) 121/57   Pulse (!) 101   Temp 98 F (36.7 C) (Oral)   Resp 17   Ht 5\' 6"  (1.676 m)   Wt 113.4 kg   LMP  (LMP Unknown) Comment: negative HCG 09/14/23  SpO2 100%   BMI 40.35 kg/m  Physical Exam Vitals and nursing note reviewed.  Constitutional:      General: She is not in acute distress.     Appearance: She is well-developed.  HENT:     Head: Normocephalic and atraumatic.     Mouth/Throat:     Mouth: Mucous membranes are moist.     Pharynx: Pharyngeal swelling and posterior oropharyngeal erythema present. No oropharyngeal exudate.  Eyes:     General: Vision grossly intact. Gaze aligned appropriately.     Extraocular Movements: Extraocular movements intact.     Conjunctiva/sclera: Conjunctivae normal.  Cardiovascular:     Rate and Rhythm: Normal rate and regular rhythm.     Pulses: Normal pulses.     Heart sounds: Normal heart sounds, S1 normal and S2 normal. No murmur heard.    No friction rub. No gallop.  Pulmonary:     Effort: Pulmonary effort is normal. No respiratory distress.     Breath sounds: Normal breath sounds.  Abdominal:     General: Bowel sounds are normal.     Palpations: Abdomen is soft.     Tenderness: There is no abdominal tenderness. There is no guarding or rebound.     Hernia: No hernia is present.  Musculoskeletal:        General: No swelling.     Cervical back: Full passive range of motion without pain, normal range of motion and neck supple.  No spinous process tenderness or muscular tenderness. Normal range of motion.     Right lower leg: No edema.     Left lower leg: No edema.  Skin:    General: Skin is warm and dry.     Capillary Refill: Capillary refill takes less than 2 seconds.     Findings: No ecchymosis, erythema, rash or wound.  Neurological:     General: No focal deficit present.     Mental Status: She is alert and oriented to person, place, and time.     GCS: GCS eye subscore is 4. GCS verbal subscore is 5. GCS motor subscore is 6.     Cranial Nerves: Cranial nerves 2-12 are intact.     Sensory: Sensation is intact.     Motor: Motor function is intact.     Coordination: Coordination is intact.  Psychiatric:        Attention and Perception: Attention normal.        Mood and Affect: Mood normal.        Speech: Speech normal.         Behavior: Behavior normal.     ED Results / Procedures / Treatments   Labs (all labs ordered are listed, but only abnormal results are displayed) Labs Reviewed  COMPREHENSIVE METABOLIC PANEL - Abnormal; Notable for the following components:      Result Value   Sodium 133 (*)    CO2 20 (*)    Calcium 8.4 (*)    AST 13 (*)    All other components within normal limits  CBC WITH DIFFERENTIAL/PLATELET - Abnormal; Notable for the following components:   WBC 11.3 (*)    Hemoglobin 11.4 (*)    HCT 35.6 (*)    MCV 72.1 (*)    MCH 23.1 (*)    RDW 15.9 (*)    Platelets 434 (*)    All other components within normal limits  HCG, SERUM, QUALITATIVE  MONONUCLEOSIS SCREEN  URINALYSIS, W/ REFLEX TO CULTURE (INFECTION SUSPECTED)  I-STAT CG4 LACTIC ACID, ED    EKG None  Radiology CT Soft Tissue Neck W Contrast Result Date: 09/14/2023 CLINICAL DATA:  Epiglottitis or tonsillitis suspected EXAM: CT NECK WITH CONTRAST TECHNIQUE: Multidetector CT imaging of the neck was performed using the standard protocol following the bolus administration of intravenous contrast. RADIATION DOSE REDUCTION: This exam was performed according to the departmental dose-optimization program which includes automated exposure control, adjustment of the mA and/or kV according to patient size and/or use of iterative reconstruction technique. CONTRAST:  75mL OMNIPAQUE IOHEXOL 300 MG/ML  SOLN COMPARISON:  08/06/2023 FINDINGS: Pharynx and larynx: 18 mm left peritonsillar collection consistent with abscess. No supraglottitis or retropharyngeal collection. Salivary glands: No inflammation, mass, or stone. Thyroid: Normal. Lymph nodes: Expected thickening of upper cervical lymph nodes. Vascular: Negative. Limited intracranial: Negative. Visualized orbits: Minimal coverage is negative Mastoids and visualized paranasal sinuses: Clear Skeleton: Unremarkable Upper chest: Clear apical lungs IMPRESSION: 1.8 cm left peritonsillar  abscess. Electronically Signed   By: Tiburcio Pea M.D.   On: 09/14/2023 05:45    Procedures .Incision and Drainage  Date/Time: 09/14/2023 7:13 AM  Performed by: Gilda Crease, MD Authorized by: Gilda Crease, MD   Consent:    Consent obtained:  Verbal   Consent given by:  Patient   Risks, benefits, and alternatives were discussed: yes     Risks discussed:  Bleeding, incomplete drainage, pain, damage to other organs and infection   Alternatives discussed:  Delayed  treatment and alternative treatment Universal protocol:    Procedure explained and questions answered to patient or proxy's satisfaction: yes     Relevant documents present and verified: yes     Test results available : yes     Imaging studies available: yes     Required blood products, implants, devices, and special equipment available: yes     Site/side marked: yes     Immediately prior to procedure, a time out was called: yes     Patient identity confirmed:  Verbally with patient Location:    Type:  Abscess   Size:  1.8   Location:  Neck   Neck location: L peritonsillar. Sedation:    Sedation type:  None Anesthesia:    Anesthesia method:  Local infiltration   Local anesthetic:  Lidocaine 2% WITH epi Procedure type:    Complexity:  Simple Procedure details:    Needle aspiration: yes     Needle size:  18 G Post-procedure details:    Procedure completion:  Tolerated well, no immediate complications Comments:     Multiple attempts - all three poles of peritonsillar area aspirated but no pus encountered     Medications Ordered in ED Medications  butamben-tetracaine-benzocaine (CETACAINE) spray 1 spray (has no administration in time range)  lidocaine-EPINEPHrine (XYLOCAINE W/EPI) 2 %-1:200000 (PF) injection 10 mL (has no administration in time range)  ketorolac (TORADOL) 30 MG/ML injection 15 mg (15 mg Intravenous Given 09/14/23 0355)  dexamethasone (DECADRON) injection 10 mg (10 mg  Intravenous Given 09/14/23 0357)  clindamycin (CLEOCIN) IVPB 600 mg (0 mg Intravenous Stopped 09/14/23 0517)  sodium chloride 0.9 % bolus 500 mL (0 mLs Intravenous Stopped 09/14/23 0517)  iohexol (OMNIPAQUE) 300 MG/ML solution 75 mL (75 mLs Intravenous Contrast Given 09/14/23 0532)    ED Course/ Medical Decision Making/ A&P                                 Medical Decision Making Amount and/or Complexity of Data Reviewed Labs: ordered. Radiology: ordered.  Risk Prescription drug management.   Patient presents with recurrent peritonsillar abscess.  Patient was seen last month and treated medically.  She was discharged with Augmentin.  Patient reports improvement and now symptoms are similar again.  Patient with fullness in the left peritonsillar area, overlying erythema and swelling.  CT scan shows recurrent or persistent abscess, now at 1.8 cm.  Discussed briefly with Dr. Jearld Fenton, on-call for ENT.  Normally this would be treated with antibiotics alone, but since there was one last month it is worth trying to drain.  I discussed this with the patient and she did give consent for me to proceed with procedure.  Procedure was performed, multiple areas starting at the superior pole and working down were attempted but no pus was encountered.  Discussed with patient that it would be reasonable to proceed with the clindamycin and outpatient follow-up.  She does agree with this plan.  Understands that she should come back to the ED, likely [facility] ED, if symptoms worsen.        Final Clinical Impression(s) / ED Diagnoses Final diagnoses:  Peritonsillar abscess    Rx / DC Orders ED Discharge Orders          Ordered    predniSONE (DELTASONE) 20 MG tablet  Daily with breakfast        09/14/23 0719    clindamycin (CLEOCIN) 150 MG capsule  4 times daily        09/14/23 0719              Gilda Crease, MD 09/14/23 [phone]

## 2023-09-14 NOTE — Discharge Instructions (Signed)
Call [facility] and tell them that you are an ER follow-up and they need to see you in the office.  If you have any worsening of your swelling, pain, fever, difficulty swallowing, difficulty breathing go to [facility] ED.

## 2023-09-18 DIAGNOSIS — J36 Peritonsillar abscess: Secondary | ICD-10-CM | POA: Diagnosis not present

## 2023-10-30 DIAGNOSIS — J36 Peritonsillar abscess: Secondary | ICD-10-CM | POA: Insufficient documentation

## 2023-11-15 ENCOUNTER — Encounter (HOSPITAL_BASED_OUTPATIENT_CLINIC_OR_DEPARTMENT_OTHER): Payer: Self-pay | Admitting: Otolaryngology

## 2023-11-15 ENCOUNTER — Other Ambulatory Visit: Payer: Self-pay

## 2023-11-15 NOTE — H&P (Signed)
HPI:   New Patient (PTA)  [name] [name] is a 33 y.o. female who presents as a new consult, referred by Gilda Crease, MD, for evaluation and treatment of left peritonsillar abscess. Patient was seen at [facility] long ED on 09/14/2023 for her symptoms. She reports previous history of peritonsillar abscess approximately 1 month prior, which was successfully treated with antibiotics and steroids. She states that the ED physician attempted incision and drainage, but was unsuccessful. The on-call ENT doctor, Dr. Jearld Fenton recommended outpatient follow-up for evaluation. Patient states that she has been taking prescribed antibiotics with progressive worsening of her symptoms. She reports dysphagia, trismus and muffling of her voice.  PMH/Meds/All/SocHx/FamHx/ROS:   History reviewed. No pertinent past medical history.  History reviewed. No pertinent surgical history.  No family history of bleeding disorders, wound healing problems or difficulty with anesthesia.   Social History   Socioeconomic History  Marital status: Single  Spouse name: Not on file  Number of children: Not on file  Years of education: Not on file  Highest education level: Not on file  Occupational History  Not on file  Tobacco Use  Smoking status: Never  Smokeless tobacco: Never  Substance and Sexual Activity  Alcohol use: Not on file  Drug use: Not on file  Sexual activity: Not on file  Other Topics Concern  Not on file  Social History Narrative  Not on file   Social Drivers of Health   Food Insecurity: Not on file  Transportation Needs: Not on file  Safety: Not on file  Living Situation: Not on file   Current Outpatient Medications:  clindamycin (CLEOCIN) 300 mg capsule, Take 1 capsule by mouth in the morning and 1 capsule in the evening., Disp: , Rfl:  methylPREDNISolone (MEDROL DOSEPAK) 4 mg 6 day dose pack, Take As Directed On Package, Disp: 21 tablet, Rfl: 0  A complete ROS was performed with pertinent  positives/negatives noted in the HPI. The remainder of the ROS are negative.   Physical Exam:   Temp 98.2 F (36.8 C)  Ht 1.676 m (5\' 6" )  Wt 116 kg (256 lb 12.8 oz)  BMI 41.45 kg/m   General: Well developed, well nourished. No acute distress. Voice muffled  Head/Face: Normocephalic, atraumatic. No scars or lesions. No sinus tenderness. Facial nerve intact and equal bilaterally.  Eyes: Pupils are equal, round and reactive to light. Conjunctiva and lids are normal. Normal extraocular mobility.  Ears: Right: Pinna and external meatus normal Left: Pinna and external meatus normal  Nose: No gross deformity or lesions. No purulent discharge.  Mouth/Oropharynx: Lips normal, teeth and gums normal with good dentition, normal oral vestibule. Normal floor of mouth, tongue and oral mucosa, no mucosal lesions, ulcer or mass, normal tongue mobility. Uvula deviated to right. Fullness of the left soft palate, consistent with peritonsillar abscess  Larynx: See TFL if applicable  Nasopharynx: See TFL if applicable  Neck: Trachea midline. No masses. No crepitus. Normal thyroid glad palpation without thyromegaly or nodules palpated. Salivary glands normal to palpation without swelling, erythema or mass.  Lymphatic: No lymphadenopathy in the neck.  Respiratory: No stridor or distress.  Extremities: No edema or cyanosis. Warm and well-perfused.  Neurologic: CN II-XII intact. Alert and oriented to self, place and time. Normal reflexes and motor skills, balance and coordination. Moving all extremities without gross abnormality.  Psychiatric: No unusual anxiety or evidence of depression. Appropriate affect.   Independent Review of Additional Tests or Records:  Documentation from referring provider reviewed, CT  neck with contrast reviewed, demonstrating left peritonsillar abscess  Procedures:   Incision and drainage  Date/Time: 09/18/2023 10:45 AM  Performed by: Carylon Perches, DO Authorized  by: Carylon Perches, DO  Consent:  Consent obtained: Verbal Consent given by: Patient Location:  Type: Abscess Location: Mouth Mouth location: Peritonsillar Anesthesia:  Anesthesia method: Local infiltration Local anesthetic: Lidocaine 1% WITH epi Procedure details:  Ultrasound guidance: no  Drainage: Purulent Post-procedure details:  Procedure completion: Tolerated well, no immediate complications Comments:  Diagnosis: Left peritonsillar abscess  Consent: Risks, benefits, and alternatives of the planned procedure were discussed and the patient expressed understanding and agreement.  Details: The patient was positioned,prepped and draped for incision and drainage of the left peritonsillar abscess. The area was injected with 3mL of 1% lidocaine with 1:100,000 epinephrine. After adequate time for vasoconstriction, the incision was made with the #11 blade scalpel over the point of maximal fluctuance above the upper pole of the tonsil. Blunt dissection was used to open all loculations of the abscess cavity. The oral cavity was copiously irrigated and hemostasis was ensured. The patient tolerated the procedure well.   Complications: None  Impression & Plans:  [name] [name] is a 33 y.o. female with striae of recurrent left peritonsillar abscess. Patient reports that this is her second abscess requiring treatment in the last 2 months. Incision and drainage was performed in the office today with copious purulence expressed, patient noted an immediate improvement in her symptoms following procedure. Advised her to complete oral antibiotics as prescribed by ED, prescription for Medrol Dosepak also sent to her pharmacy. Counseled patient as she has had more than 1 peritonsillar abscess, I recommend proceeding with tonsillectomy in the future. The risks, benefits and possible complications of the procedure were discussed in detail with the patient. They understand and concur with our plan for  surgery which we will be scheduled as an outpatient under general anesthesia. Postoperative risks of dehydration, infection, and bleeding were discussed in detail. The anticipated 10-14 day recovery was emphasized. Surgery will be scheduled in the future at her convenience. Counseled patient that if she should notice symptoms of a another peritonsillar abscess prior to surgery, to seek treatment immediately.

## 2023-11-19 NOTE — Anesthesia Preprocedure Evaluation (Signed)
Anesthesia Evaluation  Patient identified by MRN, date of birth, ID band Patient awake    Reviewed: Allergy & Precautions, NPO status , Patient's Chart, lab work & pertinent test results  History of Anesthesia Complications (+) history of anesthetic complications  Airway Mallampati: II  TM Distance: >3 FB Neck ROM: Full    Dental no notable dental hx. (+) Dental Advisory Given, Teeth Intact   Pulmonary Current Smoker and Patient abstained from smoking.   Pulmonary exam normal        Cardiovascular hypertension, Normal cardiovascular exam  Gestational hypertension   Neuro/Psych  Headaches  Anxiety        GI/Hepatic negative GI ROS, Neg liver ROS,,,  Endo/Other    Class 3 obesity  Renal/GU negative Renal ROS     Musculoskeletal negative musculoskeletal ROS (+)    Abdominal   Peds  Hematology  (+) Blood dyscrasia, anemia   Anesthesia Other Findings   Reproductive/Obstetrics                             Anesthesia Physical Anesthesia Plan  ASA: 2  Anesthesia Plan: General   Post-op Pain Management: Tylenol PO (pre-op)*   Induction: Intravenous  PONV Risk Score and Plan: 3 and Ondansetron, Dexamethasone and Treatment may vary due to age or medical condition  Airway Management Planned: Oral ETT and Video Laryngoscope Planned  Additional Equipment: None  Intra-op Plan:   Post-operative Plan: Extubation in OR  Informed Consent: I have reviewed the patients History and Physical, chart, labs and discussed the procedure including the risks, benefits and alternatives for the proposed anesthesia with the patient or authorized representative who has indicated his/her understanding and acceptance.     Dental advisory given  Plan Discussed with: Anesthesiologist and CRNA  Anesthesia Plan Comments:         Anesthesia Quick Evaluation

## 2023-11-20 ENCOUNTER — Encounter (HOSPITAL_BASED_OUTPATIENT_CLINIC_OR_DEPARTMENT_OTHER)
Admission: RE | Disposition: A | Discharge status: Home / Self Care | Payer: Self-pay | Source: Home / Self Care | Attending: Otolaryngology

## 2023-11-20 ENCOUNTER — Encounter (HOSPITAL_BASED_OUTPATIENT_CLINIC_OR_DEPARTMENT_OTHER): Payer: Self-pay | Admitting: Otolaryngology

## 2023-11-20 ENCOUNTER — Ambulatory Visit (HOSPITAL_BASED_OUTPATIENT_CLINIC_OR_DEPARTMENT_OTHER): Payer: Self-pay | Admitting: Anesthesiology

## 2023-11-20 ENCOUNTER — Ambulatory Visit (HOSPITAL_BASED_OUTPATIENT_CLINIC_OR_DEPARTMENT_OTHER)
Admission: RE | Admit: 2023-11-20 | Discharge: 2023-11-20 | Disposition: A | Discharge status: Home / Self Care | Payer: Medicaid Other | Attending: Otolaryngology | Admitting: Otolaryngology

## 2023-11-20 ENCOUNTER — Other Ambulatory Visit: Payer: Self-pay

## 2023-11-20 DIAGNOSIS — I10 Essential (primary) hypertension: Secondary | ICD-10-CM | POA: Insufficient documentation

## 2023-11-20 DIAGNOSIS — Z6841 Body Mass Index (BMI) 40.0 and over, adult: Secondary | ICD-10-CM | POA: Diagnosis not present

## 2023-11-20 DIAGNOSIS — J36 Peritonsillar abscess: Secondary | ICD-10-CM | POA: Diagnosis not present

## 2023-11-20 DIAGNOSIS — Z01818 Encounter for other preprocedural examination: Secondary | ICD-10-CM

## 2023-11-20 DIAGNOSIS — E66813 Obesity, class 3: Secondary | ICD-10-CM | POA: Insufficient documentation

## 2023-11-20 DIAGNOSIS — F17200 Nicotine dependence, unspecified, uncomplicated: Secondary | ICD-10-CM | POA: Diagnosis not present

## 2023-11-20 HISTORY — PX: TONSILLECTOMY: SHX5217

## 2023-11-20 LAB — POCT PREGNANCY, URINE: Preg Test, Ur: NEGATIVE

## 2023-11-20 SURGERY — TONSILLECTOMY
Anesthesia: General | Laterality: Bilateral

## 2023-11-20 MED ORDER — ONDANSETRON HCL 4 MG/2ML IJ SOLN
INTRAMUSCULAR | Status: AC
Start: 2023-11-20 — End: ?
  Filled 2023-11-20: qty 2

## 2023-11-20 MED ORDER — DEXAMETHASONE SODIUM PHOSPHATE 10 MG/ML IJ SOLN
INTRAMUSCULAR | Status: AC
  Filled 2023-11-20: qty 1

## 2023-11-20 MED ORDER — ACETAMINOPHEN 160 MG/5ML PO SOLN: 325.0000 mg | ORAL | Status: DC | PRN

## 2023-11-20 MED ORDER — PROPOFOL 10 MG/ML IV BOLUS
INTRAVENOUS | Status: DC | PRN
  Administered 2023-11-20 (×2): 50 mg via INTRAVENOUS
  Administered 2023-11-20: 100 mg via INTRAVENOUS
  Administered 2023-11-20: 200 mg via INTRAVENOUS

## 2023-11-20 MED ORDER — HYDROCODONE-ACETAMINOPHEN 7.5-325 MG/15ML PO SOLN: 15.0000 mL | Freq: Four times a day (QID) | ORAL | 0 refills | Status: DC | PRN

## 2023-11-20 MED ORDER — OXYCODONE HCL 5 MG/5ML PO SOLN
ORAL | Status: AC
  Filled 2023-11-20: qty 5

## 2023-11-20 MED ORDER — SUCCINYLCHOLINE CHLORIDE 200 MG/10ML IV SOSY
PREFILLED_SYRINGE | INTRAVENOUS | Status: DC | PRN
  Administered 2023-11-20: 200 mg via INTRAVENOUS

## 2023-11-20 MED ORDER — AMISULPRIDE (ANTIEMETIC) 5 MG/2ML IV SOLN
10.0000 mg | Freq: Once | INTRAVENOUS | Status: AC
  Administered 2023-11-20: 10 mg via INTRAVENOUS

## 2023-11-20 MED ORDER — DEXAMETHASONE SODIUM PHOSPHATE 4 MG/ML IJ SOLN
INTRAMUSCULAR | Status: DC | PRN
Start: 2023-11-20 — End: 2023-11-20
  Administered 2023-11-20: 10 mg via INTRAVENOUS

## 2023-11-20 MED ORDER — ROCURONIUM BROMIDE 100 MG/10ML IV SOLN
INTRAVENOUS | Status: DC | PRN
  Administered 2023-11-20: 5 mg via INTRAVENOUS
  Administered 2023-11-20 (×2): 10 mg via INTRAVENOUS

## 2023-11-20 MED ORDER — LIDOCAINE 2% (20 MG/ML) 5 ML SYRINGE
INTRAMUSCULAR | Status: AC
  Filled 2023-11-20: qty 5

## 2023-11-20 MED ORDER — FENTANYL CITRATE (PF) 100 MCG/2ML IJ SOLN
INTRAMUSCULAR | Status: DC | PRN
  Administered 2023-11-20 (×4): 50 ug via INTRAVENOUS

## 2023-11-20 MED ORDER — FENTANYL CITRATE (PF) 100 MCG/2ML IJ SOLN
INTRAMUSCULAR | Status: AC
  Filled 2023-11-20: qty 2

## 2023-11-20 MED ORDER — ACETAMINOPHEN 325 MG PO TABS: 325.0000 mg | ORAL_TABLET | ORAL | Status: DC | PRN

## 2023-11-20 MED ORDER — ONDANSETRON 4 MG PO TBDP: 4.0000 mg | ORAL_TABLET | Freq: Three times a day (TID) | ORAL | 0 refills | Status: DC | PRN

## 2023-11-20 MED ORDER — MIDAZOLAM HCL 2 MG/2ML IJ SOLN
INTRAMUSCULAR | Status: AC
  Filled 2023-11-20: qty 2

## 2023-11-20 MED ORDER — ONDANSETRON HCL 4 MG/2ML IJ SOLN: 4.0000 mg | Freq: Once | INTRAMUSCULAR | Status: DC | PRN

## 2023-11-20 MED ORDER — ONDANSETRON HCL 4 MG/2ML IJ SOLN
INTRAMUSCULAR | Status: DC | PRN
  Administered 2023-11-20: 4 mg via INTRAVENOUS

## 2023-11-20 MED ORDER — PROPOFOL 500 MG/50ML IV EMUL
INTRAVENOUS | Status: AC
  Filled 2023-11-20: qty 50

## 2023-11-20 MED ORDER — MIDAZOLAM HCL 5 MG/5ML IJ SOLN
INTRAMUSCULAR | Status: DC | PRN
  Administered 2023-11-20: 2 mg via INTRAVENOUS

## 2023-11-20 MED ORDER — MEPERIDINE HCL 25 MG/ML IJ SOLN: 6.2500 mg | INTRAMUSCULAR | Status: DC | PRN

## 2023-11-20 MED ORDER — 0.9 % SODIUM CHLORIDE (POUR BTL) OPTIME
TOPICAL | Status: DC | PRN
Start: 2023-11-20 — End: 2023-11-20
  Administered 2023-11-20: 200 mL

## 2023-11-20 MED ORDER — OXYCODONE HCL 5 MG/5ML PO SOLN
5.0000 mg | Freq: Once | ORAL | Status: AC | PRN
  Administered 2023-11-20: 5 mg via ORAL

## 2023-11-20 MED ORDER — OXYCODONE HCL 5 MG PO TABS: 5.0000 mg | ORAL_TABLET | Freq: Once | ORAL | Status: AC | PRN

## 2023-11-20 MED ORDER — DEXMEDETOMIDINE HCL IN NACL 80 MCG/20ML IV SOLN
INTRAVENOUS | Status: DC | PRN
  Administered 2023-11-20: 8 ug via INTRAVENOUS
  Administered 2023-11-20: 4 ug via INTRAVENOUS
  Administered 2023-11-20: 8 ug via INTRAVENOUS

## 2023-11-20 MED ORDER — SUGAMMADEX SODIUM 200 MG/2ML IV SOLN
INTRAVENOUS | Status: DC | PRN
  Administered 2023-11-20: 200 mg via INTRAVENOUS

## 2023-11-20 MED ORDER — AMISULPRIDE (ANTIEMETIC) 5 MG/2ML IV SOLN
INTRAVENOUS | Status: AC
  Filled 2023-11-20: qty 4

## 2023-11-20 MED ORDER — LACTATED RINGERS IV SOLN: INTRAVENOUS | Status: DC

## 2023-11-20 MED ORDER — FENTANYL CITRATE (PF) 100 MCG/2ML IJ SOLN
25.0000 ug | INTRAMUSCULAR | Status: DC | PRN
  Administered 2023-11-20 (×2): 50 ug via INTRAVENOUS

## 2023-11-20 SURGICAL SUPPLY — 26 items
CANISTER SUCT 1200ML W/VALVE (MISCELLANEOUS) ×1 IMPLANT
CATH ROBINSON RED A/P 12FR (CATHETERS) ×1 IMPLANT
CLEANER CAUTERY TIP PAD (MISCELLANEOUS) ×1 IMPLANT
COAGULATOR SUCT SWTCH 10FR 6 (ELECTROSURGICAL) ×1 IMPLANT
COVER BACK TABLE 60X90IN (DRAPES) ×1 IMPLANT
COVER MAYO STAND STRL (DRAPES) ×1 IMPLANT
DEFOGGER MIRROR 1QT (MISCELLANEOUS) IMPLANT
ELECT COATED BLADE 2.86 ST (ELECTRODE) ×1 IMPLANT
ELECT REM PT RETURN 9FT ADLT (ELECTROSURGICAL) ×1
ELECT REM PT RETURN 9FT PED (ELECTROSURGICAL)
ELECTRODE REM PT RETRN 9FT PED (ELECTROSURGICAL) IMPLANT
ELECTRODE REM PT RTRN 9FT ADLT (ELECTROSURGICAL) IMPLANT
GAUZE SPONGE 4X4 12PLY STRL LF (GAUZE/BANDAGES/DRESSINGS) ×1 IMPLANT
GLOVE ECLIPSE 7.5 STRL STRAW (GLOVE) ×1 IMPLANT
GOWN STRL REUS W/ TWL LRG LVL3 (GOWN DISPOSABLE) ×2 IMPLANT
MARKER SKIN DUAL TIP RULER LAB (MISCELLANEOUS) IMPLANT
NS IRRIG 1000ML POUR BTL (IV SOLUTION) ×1 IMPLANT
PENCIL FOOT CONTROL (ELECTRODE) ×1 IMPLANT
SHEET MEDIUM DRAPE 40X70 STRL (DRAPES) ×1 IMPLANT
SPONGE TONSIL 1 RF SGL (DISPOSABLE) IMPLANT
SPONGE TONSIL 1.25 RF SGL STRG (GAUZE/BANDAGES/DRESSINGS) IMPLANT
SYR BULB EAR ULCER 3OZ GRN STR (SYRINGE) ×1 IMPLANT
TOWEL GREEN STERILE FF (TOWEL DISPOSABLE) ×1 IMPLANT
TUBE CONNECTING 20X1/4 (TUBING) ×1 IMPLANT
TUBE SALEM SUMP 12FR 48 (TUBING) IMPLANT
TUBE SALEM SUMP 16F (TUBING) IMPLANT

## 2023-11-20 NOTE — Interval H&P Note (Signed)
History and Physical Interval Note:  11/20/2023 7:14 AM  [name] [name]  has presented today for surgery, with the diagnosis of Peritonsillar abscess.  The various methods of treatment have been discussed with the patient and family. After consideration of risks, benefits and other options for treatment, the patient has consented to  Procedure(s): TONSILLECTOMY (Bilateral) as a surgical intervention.  The patient's history has been reviewed, patient examined, no change in status, stable for surgery.  I have reviewed the patient's chart and labs.  Questions were answered to the patient's satisfaction.     [name]

## 2023-11-20 NOTE — Transfer of Care (Signed)
Immediate Anesthesia Transfer of Care Note  Patient: [name] [name]  Procedure(s) Performed: TONSILLECTOMY (Bilateral)  Patient Location: PACU  Anesthesia Type:General  Level of Consciousness: awake, alert , oriented, and patient cooperative  Airway & Oxygen Therapy: Patient Spontanous Breathing and Patient connected to face mask oxygen  Post-op Assessment: Report given to RN and Post -op Vital signs reviewed and stable  Post vital signs: Reviewed and stable  Last Vitals:  Vitals Value Taken Time  BP 142/69 11/20/23 0834  Temp    Pulse 98 11/20/23 0836  Resp 23 11/20/23 0836  SpO2 100 % 11/20/23 0836  Vitals shown include unfiled device data.  Last Pain:  Vitals:   11/20/23 0636  TempSrc: Oral  PainSc: 0-No pain      Patients Stated Pain Goal: 4 (11/20/23 0636)  Complications: No notable events documented.

## 2023-11-20 NOTE — Anesthesia Procedure Notes (Signed)
Procedure Name: Intubation Date/Time: 11/20/2023 7:42 AM  Performed by: Burna Cash, CRNAPre-anesthesia Checklist: Patient identified, Emergency Drugs available, Suction available, Patient being monitored and Timeout performed Patient Re-evaluated:Patient Re-evaluated prior to induction Oxygen Delivery Method: Circle system utilized Preoxygenation: Pre-oxygenation with 100% oxygen Induction Type: IV induction Ventilation: Mask ventilation without difficulty Laryngoscope Size: Glidescope and 3 Tube size: 7.0 mm Number of attempts: 1 Airway Equipment and Method: Video-laryngoscopy Placement Confirmation: ETT inserted through vocal cords under direct vision, positive ETCO2 and breath sounds checked- equal and bilateral Secured at: 22 cm Tube secured with: Tape Dental Injury: Teeth and Oropharynx as per pre-operative assessment

## 2023-11-20 NOTE — Discharge Instructions (Signed)

## 2023-11-20 NOTE — Op Note (Signed)
11/20/2023  8:23 AM  PATIENT:  [name] [name]  32 y.o. female  PRE-OPERATIVE DIAGNOSIS:  Peritonsillar abscess  POST-OPERATIVE DIAGNOSIS:  Peritonsillar abscess  PROCEDURE:  Procedure(s): TONSILLECTOMY  SURGEON:  Surgeon(s): [name], MD  ANESTHESIA:   General  COUNTS: Correct  EBL: 200cc   DICTATION: The patient was taken to the operating room and placed on the operating table in the supine position. Following induction of general endotracheal anesthesia, the table was turned and the patient was draped in a standard fashion. A Crowe-Davis mouthgag was inserted into the oral cavity and used to retract the tongue and mandible, then attached to the Mayo stand.  The tonsillectomy was then performed using electrocautery dissection, carefully dissecting the avascular plane between the capsule and constrictor muscles. Cautery was used for completion of hemostasis. The tonsils were enlarged and scarred with bilateral hypervascularity. , and were discarded.  The pharynx was irrigated with saline and suctioned. An oral gastric tube was used to aspirate the contents of the stomach. The patient was then awakened from anesthesia and transferred to PACU in stable condition.   PATIENT DISPOSITION:  To PACU, stable

## 2023-11-20 NOTE — Anesthesia Postprocedure Evaluation (Signed)
Anesthesia Post Note  Patient: [name] [name]  Procedure(s) Performed: TONSILLECTOMY (Bilateral)     Patient location during evaluation: PACU Anesthesia Type: General Level of consciousness: awake and alert Pain management: pain level controlled Vital Signs Assessment: post-procedure vital signs reviewed and stable Respiratory status: spontaneous breathing, nonlabored ventilation, respiratory function stable and patient connected to nasal cannula oxygen Cardiovascular status: blood pressure returned to baseline and stable Postop Assessment: no apparent nausea or vomiting Anesthetic complications: no   No notable events documented.  Last Vitals:  Vitals:   11/20/23 0930 11/20/23 0958  BP: (!) 140/95 (!) 149/98  Pulse: 72 79  Resp: 18 18  Temp:  (!) 36.4 C  SpO2: 98% 95%    Last Pain:  Vitals:   11/20/23 0958  TempSrc: Temporal  PainSc:                  Remmie Bembenek

## 2023-11-20 NOTE — OR Nursing (Signed)
Tonsils discarded per MD instruction.

## 2023-11-21 ENCOUNTER — Encounter (HOSPITAL_BASED_OUTPATIENT_CLINIC_OR_DEPARTMENT_OTHER): Payer: Self-pay | Admitting: Otolaryngology

## 2023-12-08 IMAGING — US US OB < 14 WEEKS - US OB TV
1 series · 15 of 28 positions shown · non-contrast
Comparison: None.

CLINICAL DATA: Pain, mild cramping.

EXAM:
OBSTETRIC <14 WK US AND TRANSVAGINAL OB US
TECHNIQUE: Both transabdominal and transvaginal ultrasound examinations were
performed for complete evaluation of the gestation as well as the
maternal uterus, adnexal regions, and pelvic cul-de-sac.
Transvaginal technique was performed to assess early pregnancy.

[Series 1: us ob < 14 weeks - us ob tv · 15 of 52 slices shown]
[im 1/52]
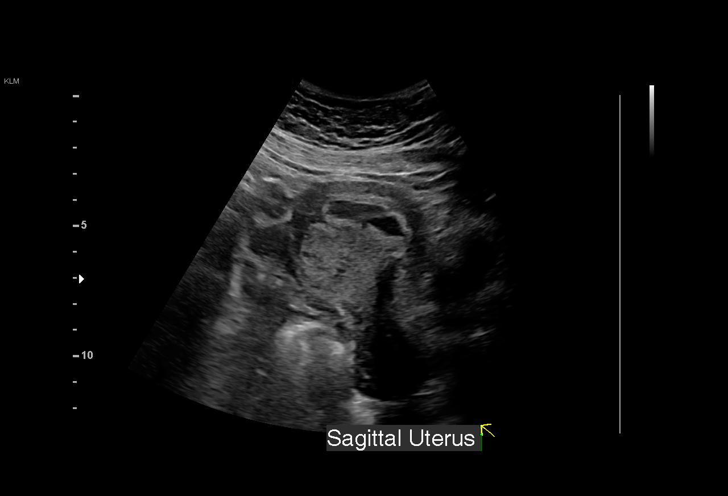
[im 4/52]
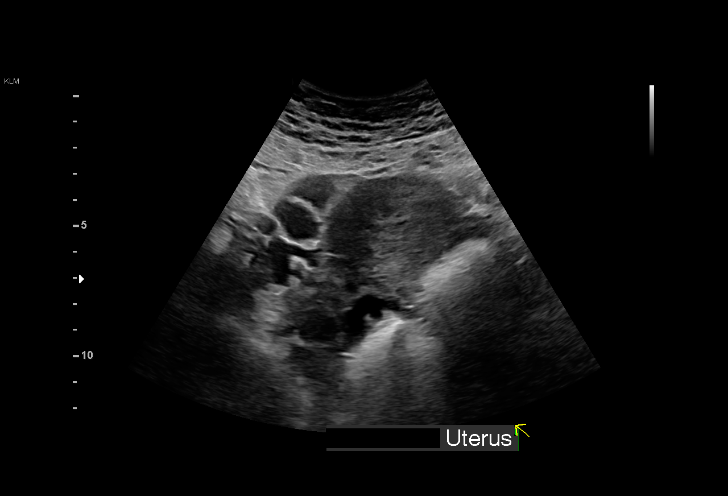
[im 8/52]
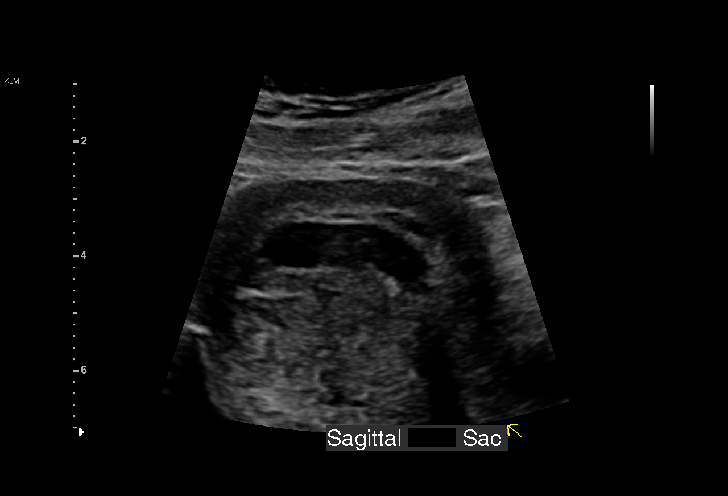
[im 12/52]
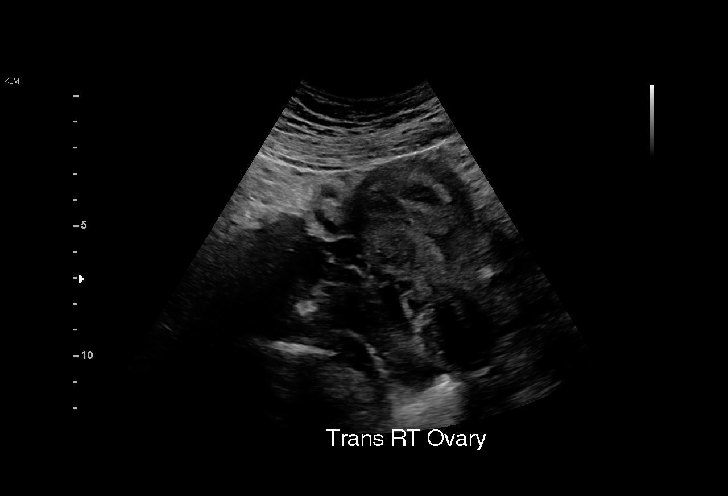
[im 16/52]
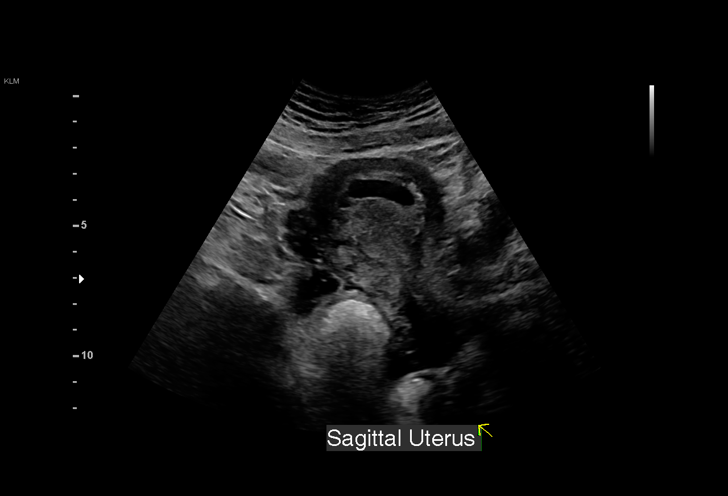
[im 19/52]
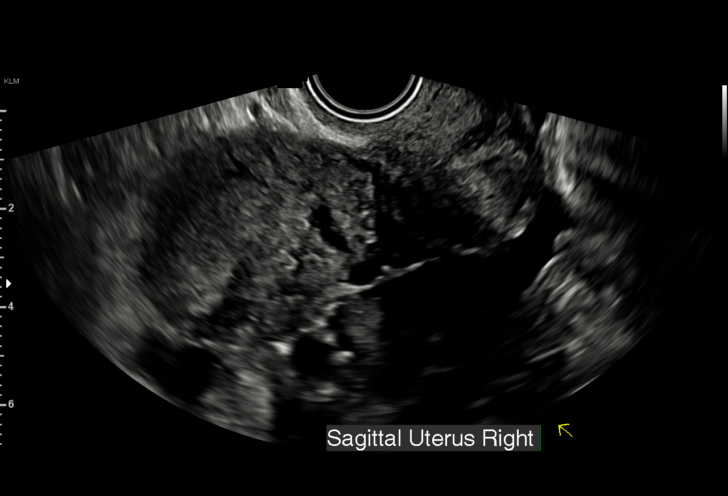
[im 23/52]
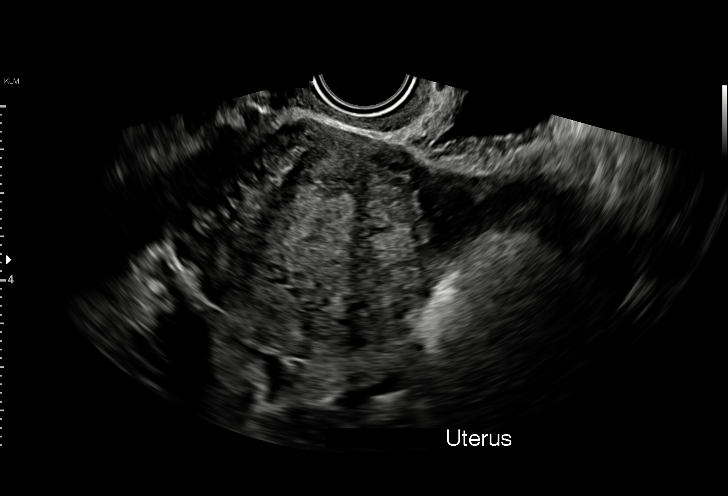
[im 27/52]
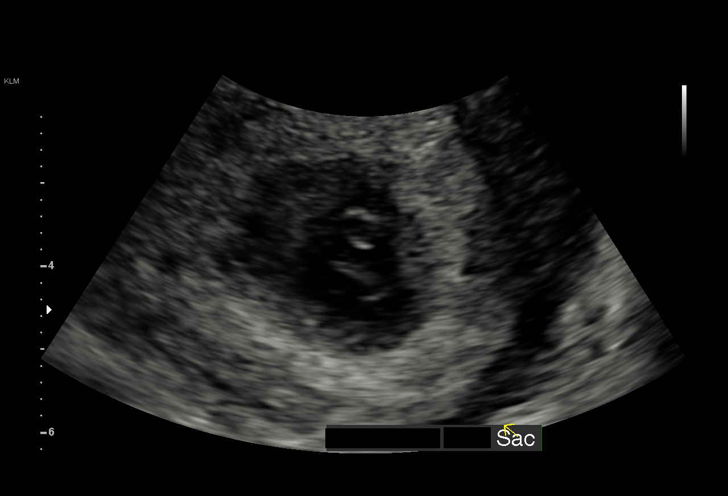
[im 29/52]
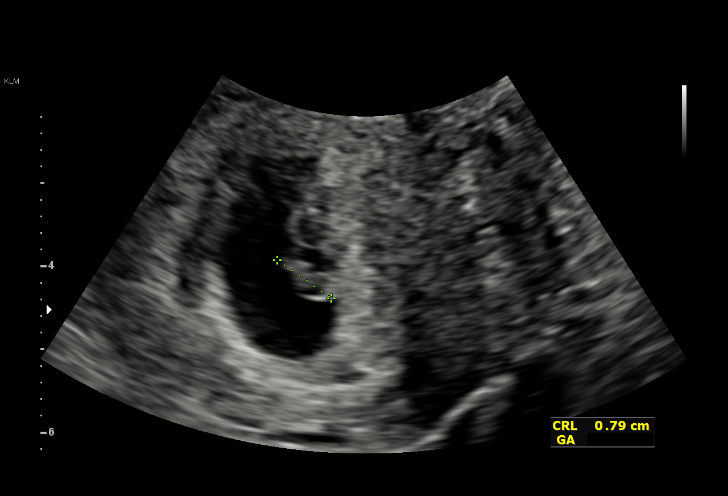
[im 33/52]
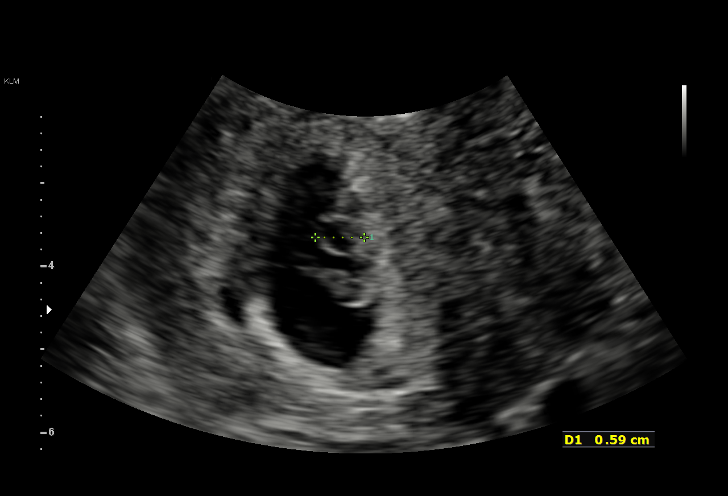
[im 36/52]
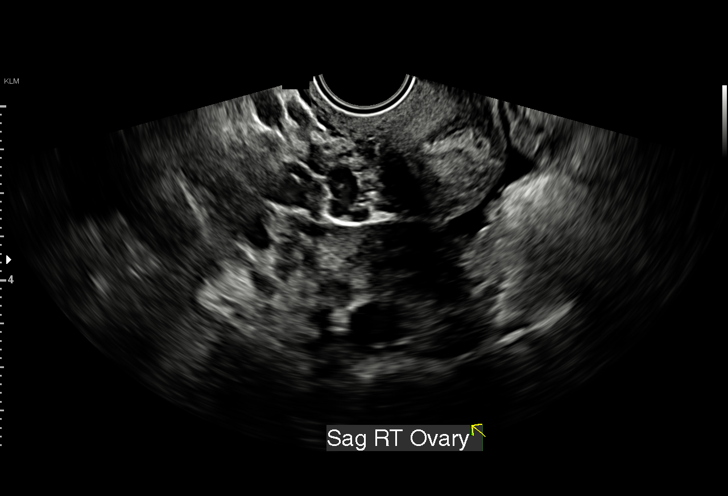
[im 40/52]
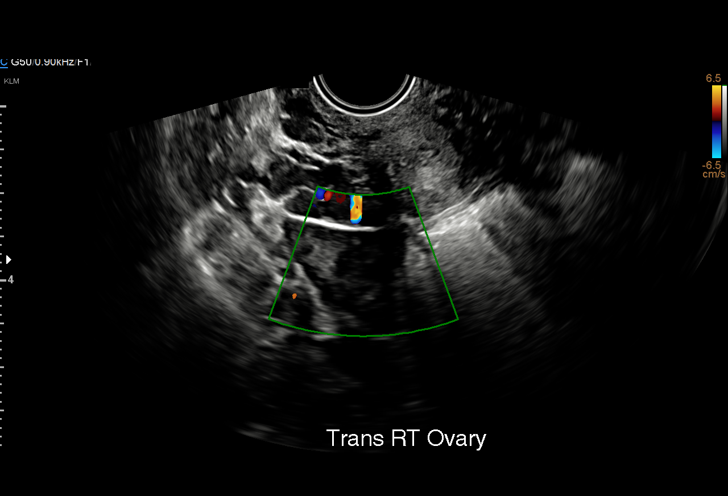
[im 44/52]
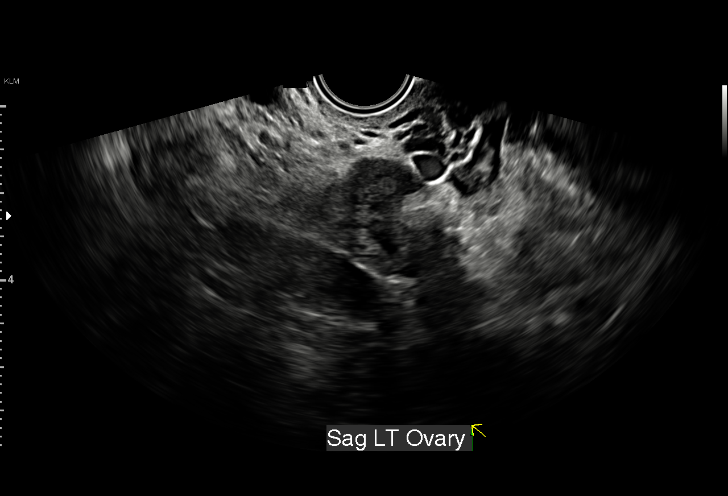
[im 48/52]
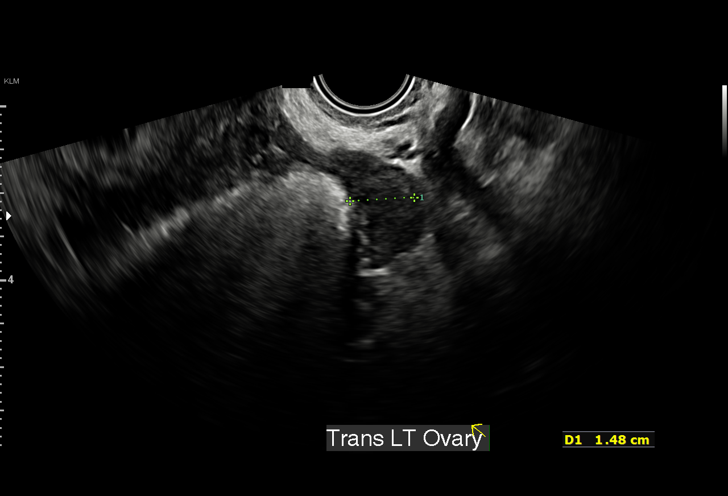
[im 52/52]
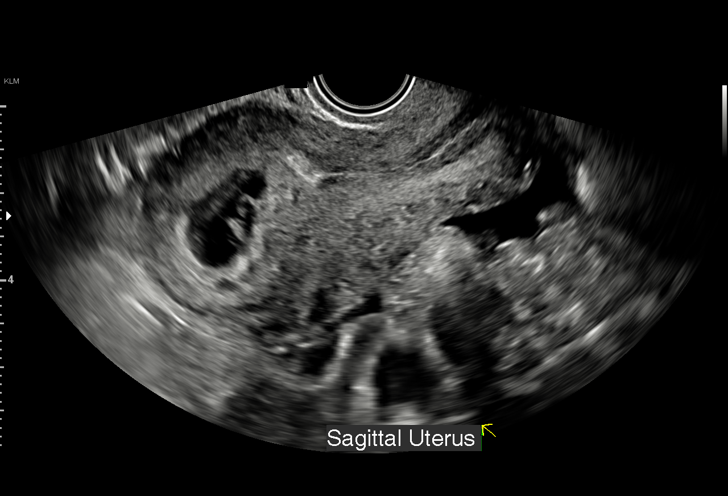

[15 of 28 positions shown; findings below may reference images not displayed]

FINDINGS: Intrauterine gestational sac: Single

Yolk sac:  Visualized.

Embryo:  Visualized.

Cardiac Activity: Visualized.

Heart Rate: 116 bpm

CRL:  8.1 mm   6 w   5 d                  US EDC: 09/02/2022

Subchorionic hemorrhage:  None visualized.

Maternal uterus/adnexae: Normal
IMPRESSION: 1. Single live intrauterine gestation with approximate gestational
age of 6 weeks and 5 days, EDC based on today's sonogram is
09/02/2022.
2. Uterus and bilateral adnexa are unremarkable.

## 2024-03-05 ENCOUNTER — Inpatient Hospital Stay (HOSPITAL_COMMUNITY)

## 2024-03-05 ENCOUNTER — Encounter (HOSPITAL_COMMUNITY): Payer: Self-pay | Admitting: *Deleted

## 2024-03-05 ENCOUNTER — Inpatient Hospital Stay (HOSPITAL_COMMUNITY)
Admission: AD | Admit: 2024-03-05 | Discharge: 2024-03-05 | Disposition: A | Discharge status: Home / Self Care | Attending: Obstetrics and Gynecology | Admitting: Obstetrics and Gynecology

## 2024-03-05 DIAGNOSIS — B9689 Other specified bacterial agents as the cause of diseases classified elsewhere: Secondary | ICD-10-CM | POA: Diagnosis not present

## 2024-03-05 DIAGNOSIS — N939 Abnormal uterine and vaginal bleeding, unspecified: Secondary | ICD-10-CM | POA: Diagnosis not present

## 2024-03-05 DIAGNOSIS — O23591 Infection of other part of genital tract in pregnancy, first trimester: Secondary | ICD-10-CM | POA: Diagnosis not present

## 2024-03-05 DIAGNOSIS — Z113 Encounter for screening for infections with a predominantly sexual mode of transmission: Secondary | ICD-10-CM | POA: Diagnosis present

## 2024-03-05 DIAGNOSIS — Z3491 Encounter for supervision of normal pregnancy, unspecified, first trimester: Secondary | ICD-10-CM

## 2024-03-05 DIAGNOSIS — N760 Acute vaginitis: Secondary | ICD-10-CM

## 2024-03-05 DIAGNOSIS — O26851 Spotting complicating pregnancy, first trimester: Secondary | ICD-10-CM | POA: Diagnosis not present

## 2024-03-05 DIAGNOSIS — O26891 Other specified pregnancy related conditions, first trimester: Secondary | ICD-10-CM | POA: Diagnosis not present

## 2024-03-05 DIAGNOSIS — O3680X0 Pregnancy with inconclusive fetal viability, not applicable or unspecified: Secondary | ICD-10-CM | POA: Diagnosis not present

## 2024-03-05 DIAGNOSIS — Z3A01 Less than 8 weeks gestation of pregnancy: Secondary | ICD-10-CM

## 2024-03-05 DIAGNOSIS — R109 Unspecified abdominal pain: Secondary | ICD-10-CM | POA: Diagnosis present

## 2024-03-05 LAB — URINALYSIS, ROUTINE W REFLEX MICROSCOPIC
Bilirubin Urine: NEGATIVE
Glucose, UA: NEGATIVE mg/dL
Hgb urine dipstick: NEGATIVE
Ketones, ur: NEGATIVE mg/dL
Leukocytes,Ua: NEGATIVE
Nitrite: NEGATIVE
Protein, ur: NEGATIVE mg/dL
Specific Gravity, Urine: 1.026 (ref 1.005–1.030)
pH: 6 (ref 5.0–8.0)

## 2024-03-05 LAB — WET PREP, GENITAL
Sperm: NONE SEEN
Trich, Wet Prep: NONE SEEN
WBC, Wet Prep HPF POC: <10 (ref <10)
Yeast Wet Prep HPF POC: NONE SEEN

## 2024-03-05 LAB — CBC
HCT: 35.9 % — ABNORMAL LOW (ref 36.0–46.0)
Hemoglobin: 11.5 g/dL — ABNORMAL LOW (ref 12.0–15.0)
MCH: 23.2 pg — ABNORMAL LOW (ref 26.0–34.0)
MCHC: 32 g/dL (ref 30.0–36.0)
MCV: 72.4 fL — ABNORMAL LOW (ref 80.0–100.0)
Platelets: 399 10*3/uL (ref 150–400)
RBC: 4.96 MIL/uL (ref 3.87–5.11)
RDW: 16 % — ABNORMAL HIGH (ref 11.5–15.5)
WBC: 9.3 10*3/uL (ref 4.0–10.5)
nRBC: 0 % (ref 0.0–0.2)

## 2024-03-05 LAB — POCT PREGNANCY, URINE: Preg Test, Ur: POSITIVE — AB

## 2024-03-05 LAB — HCG, QUANTITATIVE, PREGNANCY: hCG, Beta Chain, Quant, S: 5387 m[IU]/mL — ABNORMAL HIGH (ref <5)

## 2024-03-05 MED ORDER — ONDANSETRON HCL 4 MG PO TABS: 8.0000 mg | ORAL_TABLET | Freq: Two times a day (BID) | ORAL | 0 refills | Status: DC

## 2024-03-05 MED ORDER — METRONIDAZOLE 500 MG PO TABS
500.0000 mg | ORAL_TABLET | Freq: Two times a day (BID) | ORAL | 0 refills | Status: DC
Start: 2024-03-05 — End: 2024-04-17

## 2024-03-05 MED ORDER — PRENATAL COMPLETE 14-0.4 MG PO TABS: 1.0000 | ORAL_TABLET | Freq: Every day | ORAL | 3 refills | Status: AC

## 2024-03-05 NOTE — MAU Provider Note (Signed)
 S [name] is a 33 y.o. [phone] patient who presents to MAU today with complaint of some light vaginal spotting and mild abdominal cramping. She reports she has a positive HPT last week and believes her LMP was ~ 02/29/24.     O BP (!) 144/96   Pulse 83   Temp 98.6 F (37 C)   Resp 18   Ht 5\' 6"  (1.676 m)   Wt 107 kg   LMP 01/30/2024 (Approximate)   BMI 38.09 kg/m  Physical Exam Vitals and nursing note reviewed.  Constitutional:      General: She is not in acute distress.    Appearance: She is well-developed. She is obese. She is not ill-appearing.  HENT:     Head: Normocephalic.     Nose: Nose normal.     Mouth/Throat:     Mouth: Mucous membranes are moist.  Cardiovascular:     Rate and Rhythm: Normal rate.  Pulmonary:     Effort: Pulmonary effort is normal.  Abdominal:     Palpations: Abdomen is soft.  Musculoskeletal:        General: Normal range of motion.  Skin:    General: Skin is warm.  Neurological:     Mental Status: She is alert and oriented to person, place, and time.  Psychiatric:        Behavior: Behavior normal.     MDM   HIGH  Vaginal bleeding/spotting  in early pregnacy CBC: unremarkable HCG Quant 5,387 ABO: O Positive OB Ultrasound ( A gestational sac ~ 5 wk size visualized in the uterus) Vaginal Swabs: c/w BV  UA: Unremarkable  Early IUP with spotting  and BV on swabs ( RX provided at discharge with S/R/B of medication discussed)   Differential diagnosis considered for 1st trimester vaginal bleeding includes but is not limited to: ectopic pregnancy, complete spontaneous abortion, incomplete abortion, missed abortion, threatened abortion, embryonic/fetal demise, cervical insufficiency, cervical or vaginal disorder    Orders Placed This Encounter  Procedures   Wet prep, genital    Standing Status:   Standing    Number of Occurrences:   1   US  OB LESS THAN 14 WEEKS WITH OB TRANSVAGINAL    Standing Status:   Standing     Number of Occurrences:   1    Symptom/Reason for Exam:   Vaginal spotting [[id number]]   Urinalysis, Routine w reflex microscopic -Urine, Clean Catch    Standing Status:   Standing    Number of Occurrences:   1    Specimen Source:   Urine, Clean Catch [[id number]]   CBC    Standing Status:   Standing    Number of Occurrences:   1   hCG, quantitative, pregnancy    Standing Status:   Standing    Number of Occurrences:   1   Pregnancy, urine POC    Standing Status:   Standing    Number of Occurrences:   1   Discharge patient Discharge disposition: 01-Home or Self Care; Discharge patient date: 03/05/2024    Standing Status:   Standing    Number of Occurrences:   1    Discharge disposition:   01-Home or Self Care [1]    Discharge patient date:   03/05/2024      Results for orders placed or performed during the hospital encounter of 03/05/24 (from the past 24 hours)  Pregnancy, urine POC     Status: Abnormal   Collection Time: 03/05/24  9:25 AM  Result Value Ref Range   Preg Test, Ur POSITIVE (A) NEGATIVE  Wet prep, genital     Status: Abnormal   Collection Time: 03/05/24  9:36 AM  Result Value Ref Range   Yeast Wet Prep HPF POC NONE SEEN NONE SEEN   Trich, Wet Prep NONE SEEN NONE SEEN   Clue Cells Wet Prep HPF POC PRESENT (A) NONE SEEN   WBC, Wet Prep HPF POC <10 <10   Sperm NONE SEEN   Urinalysis, Routine w reflex microscopic -Urine, Clean Catch     Status: Abnormal   Collection Time: 03/05/24  9:37 AM  Result Value Ref Range   Color, Urine YELLOW YELLOW   APPearance HAZY (A) CLEAR   Specific Gravity, Urine 1.026 1.005 - 1.030   pH 6.0 5.0 - 8.0   Glucose, UA NEGATIVE NEGATIVE mg/dL   Hgb urine dipstick NEGATIVE NEGATIVE   Bilirubin Urine NEGATIVE NEGATIVE   Ketones, ur NEGATIVE NEGATIVE mg/dL   Protein, ur NEGATIVE NEGATIVE mg/dL   Nitrite NEGATIVE NEGATIVE   Leukocytes,Ua NEGATIVE NEGATIVE  CBC     Status: Abnormal   Collection Time: 03/05/24 10:11 AM  Result Value Ref Range   WBC  9.3 4.0 - 10.5 K/uL   RBC 4.96 3.87 - 5.11 MIL/uL   Hemoglobin 11.5 (L) 12.0 - 15.0 g/dL   HCT 16.1 (L) 09.6 - 04.5 %   MCV 72.4 (L) 80.0 - 100.0 fL   MCH 23.2 (L) 26.0 - 34.0 pg   MCHC 32.0 30.0 - 36.0 g/dL   RDW 40.9 (H) 81.1 - 91.4 %   Platelets 399 150 - 400 K/uL   nRBC 0.0 0.0 - 0.2 %  hCG, quantitative, pregnancy     Status: Abnormal   Collection Time: 03/05/24 10:11 AM  Result Value Ref Range   hCG, Beta Chain, Quant, S 5,387 (H) <5 mIU/mL      I have reviewed the patient chart and performed the physical exam . I have ordered & interpreted the lab results and reviewed and interpreted the Ultrasound images  Medications ordered as stated below.  A/P as described below.  Counseling and education provided and patient agreeable  with plan as described below. Verbalized understanding.    ASSESSMENT Medical screening exam complete  1. Vaginal spotting (Primary)  2. Normal intrauterine pregnancy on prenatal ultrasound in first trimester  3. [redacted] weeks gestation of pregnancy  4. Bacterial vaginosis      PLAN  Discharge from MAU in stable condition  Establish Prenatal Care ( Patient states she will schedule with [facility])  See AVS for full description of educational information and instructions provided to the patient at time of discharge   Warning signs for worsening condition that would warrant emergency follow-up discussed  Patient may return to MAU as needed   Cherlynn Cornfield, NP 03/05/2024 11:20 AM

## 2024-03-05 NOTE — MAU Note (Signed)
 [name] [name] is a 33 y.o. at [redacted]w[redacted]d here in MAU reporting: Had a positive HPT test last week. Has had cramping off and on since and some light spotting.   LMP: Unsure thinks end of MAY 02/29/24 Onset of complaint: last week Pain score: 6 Vitals:   03/05/24 0935  BP: (!) 144/96  Pulse: 83  Resp: 18  Temp: 98.6 F (37 C)     FHT: n/a   Lab orders placed from triage: upt, u/a wet prep, GC

## 2024-03-06 LAB — GC/CHLAMYDIA PROBE AMP (~~LOC~~) NOT AT ARMC
Chlamydia: NEGATIVE
Comment: NEGATIVE
Comment: NORMAL
Neisseria Gonorrhea: NEGATIVE

## 2024-04-01 ENCOUNTER — Other Ambulatory Visit (INDEPENDENT_AMBULATORY_CARE_PROVIDER_SITE_OTHER): Payer: Self-pay

## 2024-04-01 ENCOUNTER — Ambulatory Visit: Admitting: *Deleted

## 2024-04-01 VITALS — BP 125/80 | HR 84 | Wt 233.5 lb

## 2024-04-01 DIAGNOSIS — O0990 Supervision of high risk pregnancy, unspecified, unspecified trimester: Secondary | ICD-10-CM | POA: Insufficient documentation

## 2024-04-01 DIAGNOSIS — O3680X0 Pregnancy with inconclusive fetal viability, not applicable or unspecified: Secondary | ICD-10-CM

## 2024-04-01 DIAGNOSIS — Z3A09 9 weeks gestation of pregnancy: Secondary | ICD-10-CM | POA: Diagnosis not present

## 2024-04-01 DIAGNOSIS — O0991 Supervision of high risk pregnancy, unspecified, first trimester: Secondary | ICD-10-CM | POA: Diagnosis not present

## 2024-04-01 MED ORDER — PROMETHAZINE HCL 25 MG PO TABS: 25.0000 mg | ORAL_TABLET | Freq: Four times a day (QID) | ORAL | 1 refills | Status: DC | PRN

## 2024-04-01 MED ORDER — BLOOD PRESSURE KIT DEVI: 1.0000 | 0 refills | Status: DC

## 2024-04-01 MED ORDER — DOXYLAMINE-PYRIDOXINE 10-10 MG PO TBEC: 2.0000 | DELAYED_RELEASE_TABLET | Freq: Every day | ORAL | 5 refills | Status: DC

## 2024-04-01 NOTE — Progress Notes (Signed)
 New OB Intake  I connected with [name] [name]  on 04/01/24 at  1:10 PM EDT by In Person Visit and verified that I am speaking with the correct person using two identifiers. Nurse is located at [facility] and pt is located at [facility].  I discussed the limitations, risks, security and privacy concerns of performing an evaluation and management service by telephone and the availability of in person appointments. I also discussed with the patient that there may be a patient responsible charge related to this service. The patient expressed understanding and agreed to proceed.  I explained I am completing New OB Intake today. We discussed EDD of Not found.. Pt is [id number]. I reviewed her allergies, medications and Medical/Surgical/OB history.    Patient Active Problem List   Diagnosis Date Noted   Peritonsillar abscess 10/30/2023   Right medial tibial plateau fracture 11/24/2021   Fetal pericardial effusion affecting management of mother 08/11/2017   Pre-eclampsia 07/31/2017   Group B streptococcus urinary tract infection affecting pregnancy 04/24/2017   Encounter for supervision of normal pregnancy, unspecified, unspecified trimester 04/10/2017   History of gestational hypertension 04/10/2017   Short interval between pregnancies affecting pregnancy, antepartum 04/10/2017   Murmur, cardiac 02/10/2016     Concerns addressed today  Delivery Plans Plans to deliver at [facility]. Discussed the nature of our practice with multiple providers including residents and students. Due to the size of the practice, the delivering provider may not be the same as those providing prenatal care.   Patient is not interested in water birth.  MyChart/Babyscripts MyChart access verified. I explained pt will have some visits in office and some virtually. Babyscripts instructions given and order placed. Patient verifies receipt of registration text/e-mail. Account successfully created and app downloaded. If patient is a  candidate for Optimized scheduling, add to sticky note.   Blood Pressure Cuff/Weight Scale Blood pressure cuff ordered for patient to pick-up from [employer]. Explained after first prenatal appt pt will check weekly and document in Babyscripts. Patient does not have weight scale; patient may purchase if they desire to track weight weekly in Babyscripts.  Anatomy US  Explained first scheduled US  will be around 19 weeks. Anatomy US  scheduled for TBD at TBD.  Is patient a candidate for Babyscripts Optimization? No, due to Risk Factors   First visit review I reviewed new OB appt with patient. Explained pt will be seen by Dr. Rudy at first visit. Discussed [place] genetic screening with patient. Requests Panorama and Horizon.. Routine prenatal labs OB Urine only collected at today's visit.   Last Pap Diagnosis  Date Value Ref Range Status  02/03/2023   Final   - Negative for intraepithelial lesion or malignancy (NILM)    [name] CHRISTELLA Ober, RN 04/01/2024  2:00 PM

## 2024-04-08 ENCOUNTER — Ambulatory Visit: Payer: Self-pay | Admitting: Licensed Clinical Social Worker

## 2024-04-08 ENCOUNTER — Ambulatory Visit: Payer: Self-pay | Admitting: Obstetrics and Gynecology

## 2024-04-08 DIAGNOSIS — R8271 Bacteriuria: Secondary | ICD-10-CM | POA: Insufficient documentation

## 2024-04-08 LAB — URINE CULTURE, OB REFLEX

## 2024-04-08 LAB — OB RESULTS CONSOLE GBS: GBS: POSITIVE

## 2024-04-08 LAB — CULTURE, OB URINE

## 2024-04-08 NOTE — BH Specialist Note (Signed)
 [facility] provided virtual link to patient on this date. [facility] contacted patient and left a message. Patient no showed for today's visit.

## 2024-04-11 ENCOUNTER — Encounter: Admitting: Obstetrics

## 2024-04-17 ENCOUNTER — Other Ambulatory Visit (HOSPITAL_COMMUNITY)
Admission: RE | Admit: 2024-04-17 | Discharge: 2024-04-17 | Disposition: A | Discharge status: Home / Self Care | Source: Ambulatory Visit | Attending: Physician Assistant | Admitting: Physician Assistant

## 2024-04-17 ENCOUNTER — Ambulatory Visit: Admitting: Obstetrics and Gynecology

## 2024-04-17 VITALS — BP 129/81 | HR 85 | Wt 227.0 lb

## 2024-04-17 DIAGNOSIS — O09299 Supervision of pregnancy with other poor reproductive or obstetric history, unspecified trimester: Secondary | ICD-10-CM

## 2024-04-17 DIAGNOSIS — Z1331 Encounter for screening for depression: Secondary | ICD-10-CM

## 2024-04-17 DIAGNOSIS — R1110 Vomiting, unspecified: Secondary | ICD-10-CM

## 2024-04-17 DIAGNOSIS — O218 Other vomiting complicating pregnancy: Secondary | ICD-10-CM

## 2024-04-17 DIAGNOSIS — O99820 Streptococcus B carrier state complicating pregnancy: Secondary | ICD-10-CM | POA: Diagnosis not present

## 2024-04-17 DIAGNOSIS — O09291 Supervision of pregnancy with other poor reproductive or obstetric history, first trimester: Secondary | ICD-10-CM | POA: Diagnosis not present

## 2024-04-17 DIAGNOSIS — O0990 Supervision of high risk pregnancy, unspecified, unspecified trimester: Secondary | ICD-10-CM

## 2024-04-17 DIAGNOSIS — R8271 Bacteriuria: Secondary | ICD-10-CM

## 2024-04-17 DIAGNOSIS — Z3481 Encounter for supervision of other normal pregnancy, first trimester: Secondary | ICD-10-CM

## 2024-04-17 DIAGNOSIS — Z3A11 11 weeks gestation of pregnancy: Secondary | ICD-10-CM

## 2024-04-17 DIAGNOSIS — O210 Mild hyperemesis gravidarum: Secondary | ICD-10-CM | POA: Insufficient documentation

## 2024-04-17 MED ORDER — ASPIRIN 81 MG PO TBEC: 81.0000 mg | DELAYED_RELEASE_TABLET | Freq: Every day | ORAL | 2 refills | Status: DC

## 2024-04-17 MED ORDER — SCOPOLAMINE 1 MG/3DAYS TD PT72: 1.0000 | MEDICATED_PATCH | TRANSDERMAL | 12 refills | Status: DC

## 2024-04-17 MED ORDER — METOCLOPRAMIDE HCL 10 MG PO TABS: 10.0000 mg | ORAL_TABLET | Freq: Four times a day (QID) | ORAL | 1 refills | Status: DC

## 2024-04-17 NOTE — Progress Notes (Signed)
 Pt presents for new ob. Pt PHQ-9 score is 9 GAD-7 is 3. Pt has no questions or concerns at this time. Needs a referral only to [facility].

## 2024-04-17 NOTE — Progress Notes (Signed)
 Subjective:   [name] [name] is a 33 y.o. H3E6885 at [redacted]w[redacted]d by early ultrasound being seen today for her first obstetrical visit.  Her obstetrical history is significant for group B strep colonizer and preeclampsia in last pregnancy '18/gHTN '17. Patient does intend to breast feed. Pregnancy history fully reviewed.  Patient reports vomiting. Has improved from ~1 month ago but still persisting. Has lost ~25 lbs. Isn't able to take phenergan  frequently because of fatigue. Zofran  not helpful. Diclegis  minimally helpful.   No new health problems since last pregnancy. No pre-pregnancy medications.  HISTORY: OB History  Gravida Para Term Preterm AB Living  6 4 3 1 1 4   SAB IAB Ectopic Multiple Live Births  0 1 0 0 4    # Outcome Date GA Lbr Len/2nd Weight Sex Type Anes PTL Lv  6 Current           5 Preterm 08/12/17 [redacted]w[redacted]d 02:02 / 00:09 4 lb 15.5 oz (2.255 kg) M Vag-Spont EPI  LIV     Name: Barabas,BOY Anahi     Apgar1: 9  Apgar5: 9  4 Term 08/24/16 [redacted]w[redacted]d 05:54 / 00:21 5 lb 8.5 oz (2.509 kg) F Vag-Spont EPI  LIV     Name: Corriher,GIRL Chyane     Apgar1: 9  Apgar5: 10  3 Term 12/14/13 [redacted]w[redacted]d 10:03 / 00:08 6 lb 4.5 oz (2.85 kg) F Vag-Spont EPI  LIV     Name: Darin,GIRL Mattison     Apgar1: 9  Apgar5: 9  2 Term 07/03/12 [redacted]w[redacted]d 33:31 / 00:09 5 lb 7.1 oz (2.469 kg) F Vag-Spont EPI  LIV     Name: Otte,GIRL Trysta     Apgar1: 9  Apgar5: 9  1 IAB            Last pap smear was  5/24 and was normal Past Medical History:  Diagnosis Date   Anemia    Anxiety    Chlamydia 10/2011   Cholecystitis    Closed head injury with concussion    Gestational hypertension    Headache(784.0)    Heart palpitations    Hypertension    Past Surgical History:  Procedure Laterality Date   ORIF ANKLE FRACTURE Left 02/12/2016   Procedure: OPEN REDUCTION INTERNAL FIXATION (ORIF) ANKLE FRACTURE;  Surgeon: Ozell Bruch, MD;  Location: [facility] OR;  Service: Orthopedics;  Laterality: Left;   ORIF TIBIA PLATEAU Right 11/26/2021    Procedure: OPEN REDUCTION INTERNAL FIXATION (ORIF) TIBIAL PLATEAU;  Surgeon: Kendal Franky SQUIBB, MD;  Location: [facility] OR;  Service: Orthopedics;  Laterality: Right;   TONSILLECTOMY Bilateral 11/20/2023   Procedure: TONSILLECTOMY;  Surgeon: Jesus Oliphant, MD;  Location: [facility];  Service: ENT;  Laterality: Bilateral;   Family History  Problem Relation Age of Onset   Diabetes Mother    Hypertension Mother    Migraines Father    Diabetes Maternal Grandmother    Hypertension Maternal Grandmother    Cancer Maternal Grandmother    Anesthesia problems Neg Hx    Other Neg Hx    Social History   Tobacco Use   Smoking status: Every Day    Current packs/day: 0.50    Average packs/day: 0.5 packs/day for 10.0 years (5.0 ttl pk-yrs)    Types: Cigarettes   Smokeless tobacco: Never  Vaping Use   Vaping status: Never Used  Substance Use Topics   Alcohol use: No    Comment: socially   Drug use: No   Allergies  Allergen Reactions   Metronidazole  Nausea And Vomiting    Can take if given Zofran  to take it with.   Current Outpatient Medications on File Prior to Visit  Medication Sig Dispense Refill   Doxylamine -Pyridoxine  (DICLEGIS ) 10-10 MG TBEC Take 2 tablets by mouth at bedtime. If symptoms persist, add one tablet in the morning and one in the afternoon 100 tablet 5   Prenatal Vit-Fe Fumarate-FA (PRENATAL COMPLETE) 14-0.4 MG TABS Take 1 tablet by mouth daily. 60 tablet 3   promethazine  (PHENERGAN ) 25 MG tablet Take 1 tablet (25 mg total) by mouth every 6 (six) hours as needed for nausea or vomiting. 30 tablet 1   Blood Pressure Monitoring (BLOOD PRESSURE KIT) DEVI 1 Device by Does not apply route once a week. (Patient not taking: Reported on 04/17/2024) 1 each 0   No current facility-administered medications on file prior to visit.     Exam   Vitals:   04/17/24 1520  BP: 129/81  Pulse: 85  Weight: 227 lb (103 kg)   Fetal Heart Rate (bpm): 140  System: General:  well-developed, well-nourished female in no acute distress   Breast:  normal appearance, no masses or tenderness   Skin: normal coloration and turgor, no rashes   Neurologic: oriented, normal, negative, normal mood   Extremities: normal strength, tone, and muscle mass   HEENT extraocular movement intact and sclera clear, anicteric   Mouth/Teeth mucous membranes moist, pharynx normal without lesions and dental hygiene good   Neck supple and no masses   Cardiovascular: regular rate   Respiratory:  no respiratory distress   Abdomen: soft, non-tender; no masses,  no organomegaly     Assessment:   Pregnancy: [id number] Patient Active Problem List   Diagnosis Date Noted   Hyperemesis gravidarum 04/17/2024   GBS bacteriuria 04/08/2024   Supervision of high risk pregnancy, antepartum 04/01/2024   Peritonsillar abscess 10/30/2023   Right medial tibial plateau fracture 11/24/2021   Hx of preeclampsia, prior pregnancy, currently pregnant 07/31/2017   History of gestational hypertension 04/10/2017   Murmur, cardiac 02/10/2016     Plan:   1. Supervision of high risk pregnancy, antepartum (Primary) Complicated by HG. See plan below. Will start bASA at 12 weeks given hx of preE. - CBC/D/Plt+RPR+Rh+ABO+RubIgG... - Comp Met (CMET) - HgB A1c - PANORAMA PRENATAL TEST - [facility]ORIZON Basic Panel - Cervicovaginal ancillary only( [place])  2. Hx of preeclampsia, prior pregnancy, currently pregnant - Comp Met (CMET) - aspirin  EC 81 MG tablet; Take 1 tablet (81 mg total) by mouth daily. Swallow whole. Start at 12 weeks and continue rest of pregnancy  Dispense: 90 tablet; Refill: 2  3. Hyperemesis Add reglan  and scopolamine . No overt vomiting or dehydration at today's appointment. - metoCLOPramide  (REGLAN ) 10 MG tablet; Take 1 tablet (10 mg total) by mouth every 6 (six) hours.  Dispense: 90 tablet; Refill: 1 - scopolamine  (TRANSDERM-SCOP) 1 MG/3DAYS; Place 1 patch (1.5 mg total) onto the skin every 3  (three) days.  Dispense: 10 patch; Refill: 12  4. GBS bacteriuria Abx during labor.  Initial labs drawn. Continue prenatal vitamins. Genetic Screening discussed, First trimester screen: requested. Ultrasound discussed; fetal anatomic survey: requested. Problem list reviewed and updated. The nature of [facility] with multiple MDs and other Advanced Practice Providers was explained to patient; also emphasized that residents, students are part of our team. Routine obstetric precautions reviewed.  Return in about 4 weeks (around 05/15/2024) for ROB.  Almarie CHRISTELLA Moats,  MD

## 2024-04-18 LAB — CBC/D/PLT+RPR+RH+ABO+RUBIGG...
Antibody Screen: NEGATIVE
Basophils Absolute: 0 x10E3/uL (ref 0.0–0.2)
Basos: 0 %
EOS (ABSOLUTE): 0.1 x10E3/uL (ref 0.0–0.4)
Eos: 2 %
HCV Ab: NONREACTIVE
HIV Screen 4th Generation wRfx: NONREACTIVE
Hematocrit: 39 % (ref 34.0–46.6)
Hemoglobin: 11.9 g/dL (ref 11.1–15.9)
Hepatitis B Surface Ag: NEGATIVE
Immature Grans (Abs): 0 x10E3/uL (ref 0.0–0.1)
Immature Granulocytes: 0 %
Lymphocytes Absolute: 2.3 x10E3/uL (ref 0.7–3.1)
Lymphs: 35 %
MCH: 23.2 pg — ABNORMAL LOW (ref 26.6–33.0)
MCHC: 30.5 g/dL — ABNORMAL LOW (ref 31.5–35.7)
MCV: 76 fL — ABNORMAL LOW (ref 79–97)
Monocytes Absolute: 0.7 x10E3/uL (ref 0.1–0.9)
Monocytes: 10 %
Neutrophils Absolute: 3.6 x10E3/uL (ref 1.4–7.0)
Neutrophils: 53 %
Platelets: 404 x10E3/uL (ref 150–450)
RBC: 5.14 x10E6/uL (ref 3.77–5.28)
RDW: 14.7 % (ref 11.7–15.4)
RPR Ser Ql: NONREACTIVE
Rh Factor: POSITIVE
Rubella Antibodies, IGG: 2.23 {index} (ref >0.99)
WBC: 6.7 x10E3/uL (ref 3.4–10.8)

## 2024-04-18 LAB — COMPREHENSIVE METABOLIC PANEL WITH GFR
ALT: 7 IU/L (ref 0–32)
AST: 12 IU/L (ref 0–40)
Albumin: 3.9 g/dL (ref 3.9–4.9)
Alkaline Phosphatase: 42 IU/L — ABNORMAL LOW (ref 44–121)
BUN/Creatinine Ratio: 8 — ABNORMAL LOW (ref 9–23)
BUN: 5 mg/dL — ABNORMAL LOW (ref 6–20)
Bilirubin Total: <0.2 mg/dL (ref 0.0–1.2)
CO2: 18 mmol/L — ABNORMAL LOW (ref 20–29)
Calcium: 9.4 mg/dL (ref 8.7–10.2)
Chloride: 103 mmol/L (ref 96–106)
Creatinine, Ser: 0.62 mg/dL (ref 0.57–1.00)
Globulin, Total: 3.1 g/dL (ref 1.5–4.5)
Glucose: 73 mg/dL (ref 70–99)
Potassium: 4.6 mmol/L (ref 3.5–5.2)
Sodium: 135 mmol/L (ref 134–144)
Total Protein: 7 g/dL (ref 6.0–8.5)
eGFR: 122 mL/min/1.73 (ref >59)

## 2024-04-18 LAB — CERVICOVAGINAL ANCILLARY ONLY
Bacterial Vaginitis (gardnerella): POSITIVE — AB
Candida Glabrata: NEGATIVE
Candida Vaginitis: POSITIVE — AB
Chlamydia: NEGATIVE
Comment: NEGATIVE
Comment: NEGATIVE
Comment: NEGATIVE
Comment: NEGATIVE
Comment: NEGATIVE
Comment: NORMAL
Neisseria Gonorrhea: NEGATIVE
Trichomonas: NEGATIVE

## 2024-04-18 LAB — HEMOGLOBIN A1C
Est. average glucose Bld gHb Est-mCnc: 108 mg/dL
Hgb A1c MFr Bld: 5.4 % (ref 4.8–5.6)

## 2024-04-18 LAB — HCV INTERPRETATION

## 2024-04-19 ENCOUNTER — Other Ambulatory Visit: Payer: Self-pay | Admitting: Medical Genetics

## 2024-04-20 ENCOUNTER — Ambulatory Visit: Payer: Self-pay | Admitting: Obstetrics and Gynecology

## 2024-04-20 DIAGNOSIS — B379 Candidiasis, unspecified: Secondary | ICD-10-CM

## 2024-04-20 DIAGNOSIS — D563 Thalassemia minor: Secondary | ICD-10-CM

## 2024-04-20 DIAGNOSIS — B9689 Other specified bacterial agents as the cause of diseases classified elsewhere: Secondary | ICD-10-CM

## 2024-04-20 MED ORDER — METRONIDAZOLE 0.75 % EX GEL
1.0000 | Freq: Two times a day (BID) | CUTANEOUS | 0 refills | Status: AC
Start: 2024-04-20 — End: 2024-04-25

## 2024-04-20 MED ORDER — TERCONAZOLE 0.8 % VA CREA: 1.0000 | TOPICAL_CREAM | Freq: Every day | VAGINAL | 0 refills | Status: AC

## 2024-04-23 LAB — PANORAMA PRENATAL TEST FULL PANEL:PANORAMA TEST PLUS 5 ADDITIONAL MICRODELETIONS: FETAL FRACTION: 5.9

## 2024-04-29 DIAGNOSIS — D563 Thalassemia minor: Secondary | ICD-10-CM | POA: Insufficient documentation

## 2024-04-29 LAB — HORIZON CUSTOM: REPORT SUMMARY: POSITIVE — AB

## 2024-04-30 ENCOUNTER — Other Ambulatory Visit: Payer: Self-pay

## 2024-04-30 DIAGNOSIS — D563 Thalassemia minor: Secondary | ICD-10-CM

## 2024-05-06 ENCOUNTER — Other Ambulatory Visit: Payer: Self-pay

## 2024-05-06 DIAGNOSIS — Z7689 Persons encountering health services in other specified circumstances: Secondary | ICD-10-CM

## 2024-05-06 NOTE — Progress Notes (Signed)
 Referral placed for pt to receive PCP for issues outside of pregnancy/gyn related.

## 2024-05-15 ENCOUNTER — Ambulatory Visit: Admitting: Physician Assistant

## 2024-05-15 VITALS — BP 129/81 | HR 79 | Wt 230.0 lb

## 2024-05-15 DIAGNOSIS — O09299 Supervision of pregnancy with other poor reproductive or obstetric history, unspecified trimester: Secondary | ICD-10-CM

## 2024-05-15 DIAGNOSIS — Z3A15 15 weeks gestation of pregnancy: Secondary | ICD-10-CM | POA: Diagnosis not present

## 2024-05-15 DIAGNOSIS — O210 Mild hyperemesis gravidarum: Secondary | ICD-10-CM

## 2024-05-15 DIAGNOSIS — O0990 Supervision of high risk pregnancy, unspecified, unspecified trimester: Secondary | ICD-10-CM | POA: Diagnosis not present

## 2024-05-15 DIAGNOSIS — O99519 Diseases of the respiratory system complicating pregnancy, unspecified trimester: Secondary | ICD-10-CM

## 2024-05-15 DIAGNOSIS — J310 Chronic rhinitis: Secondary | ICD-10-CM

## 2024-05-15 DIAGNOSIS — M26622 Arthralgia of left temporomandibular joint: Secondary | ICD-10-CM

## 2024-05-15 MED ORDER — FLUTICASONE PROPIONATE 50 MCG/ACT NA SUSP: 1.0000 | Freq: Two times a day (BID) | NASAL | 2 refills | Status: DC

## 2024-05-15 NOTE — Progress Notes (Signed)
 C/O sneezing all the time, running nose, and problem with ears. Also pain in groin area. Will p/u BP cuff. No other concerns

## 2024-05-15 NOTE — Progress Notes (Signed)
   PRENATAL VISIT NOTE  Subjective:  [name] [name] is a 33 y.o. H3E6885 at [redacted]w[redacted]d being seen today for ongoing prenat al care.  She is currently monitored for the following issues for this high-risk pregnancy and has Murmur, cardiac; History of gestational hypertension; Hx of preeclampsia, prior pregnancy, currently pregnant; Right medial tibial plateau fracture; Peritonsillar abscess; Supervision of high risk pregnancy, antepartum; GBS bacteriuria; Hyperemesis gravidarum; and Alpha thalassemia silent carrier on their problem list.  Patient reports she hasn't been able to stop sneezing, congestion, runny nose x1 month in pregnancy only. Endorses history of seasonal allergies. No pets living in household. Denies fever, facial pressure, sore throat, itchy eyes. Also has history of grinding her teeth that causes intermittent  L jaw and ear pain. No pain now.   Contractions: Not present. Vag. Bleeding: None.  Movement: Present. Denies leaking of fluid.   The following portions of the patient's history were reviewed and updated as appropriate: allergies, current medications, past family history, past medical history, past social history, past surgical history and problem list.   Objective:    Vitals:   05/15/24 1320  BP: 129/81  Pulse: 79  Weight: 230 lb (104.3 kg)    Fetal Status:  Fetal Heart Rate (bpm): 152 Fundal Height: 15 cm Movement: Present    General: Alert, oriented and cooperative. Patient is in no acute distress.  Skin: Skin is warm and dry. No rash noted.   Cardiovascular: Normal heart rate noted  Respiratory: Normal respiratory effort, no problems with respiration noted  Abdomen: Soft, gravid, appropriate for gestational age.  Pain/Pressure: Absent     Pelvic: Cervical exam deferred        Extremities: Normal range of motion.  Edema: None  Mental Status: Normal mood and affect. Normal behavior. Normal judgment and thought content.   Assessment and Plan:  Pregnancy: H3E6885  at [redacted]w[redacted]d 1. Supervision of high risk pregnancy, antepartum (Primary) Patient doing well, feeling regular fetal movement   2. [redacted] weeks gestation of pregnancy Anticipatory guidance about next visits/weeks of pregnancy given.  - AFP, Serum, Open Spina Bifida  3. Hyperemesis gravidarum Subsided. No current n/v with normal baseline appetite.   4. Hx of preeclampsia, prior pregnancy, currently pregnant Normotensive on no meds Baseline labs pending  Continue ASA  - Protein / creatinine ratio, urine  5. Pregnancy rhinitis - fluticasone  (FLONASE ) 50 MCG/ACT nasal spray; Place 1 spray into both nostrils in the morning and at bedtime.  Dispense: 11.1 mL; Refill: 2   Preterm labor symptoms and general obstetric precautions including but not limited to vaginal bleeding, contractions, leaking of fluid and fetal movement were reviewed in detail with the patient.  Please refer to After Visit Summary for other counseling recommendations.   Return in about 4 weeks (around 06/12/2024) for [facility].  Future Appointments  Date Time Provider Department Center  06/19/2024  9:00 AM [facility] PROVIDER 1 [facility]  06/19/2024  9:30 AM [facility] US2 [facility]  06/19/2024 10:30 AM [facility] GENETIC COUNSELING RM [facility]    Jorene FORBES Moats, PA-C

## 2024-05-17 LAB — AFP, SERUM, OPEN SPINA BIFIDA
AFP MoM: 0.71
AFP Value: 17.6 ng/mL
Gest. Age on Collection Date: 15 wk
Maternal Age At EDD: 31.6 a
OSBR Risk 1 IN: 10000
Test Results:: NEGATIVE
Weight: 230 [lb_av]

## 2024-05-17 LAB — PROTEIN / CREATININE RATIO, URINE
Creatinine, Urine: 149.9 mg/dL
Protein, Ur: 31.6 mg/dL
Protein/Creat Ratio: 211 mg/g{creat} — ABNORMAL HIGH (ref 0–200)

## 2024-05-21 ENCOUNTER — Ambulatory Visit: Payer: Self-pay | Admitting: Physician Assistant

## 2024-05-21 DIAGNOSIS — R609 Edema, unspecified: Secondary | ICD-10-CM | POA: Diagnosis not present

## 2024-05-31 ENCOUNTER — Encounter (INDEPENDENT_AMBULATORY_CARE_PROVIDER_SITE_OTHER): Payer: Self-pay

## 2024-06-07 DIAGNOSIS — O99210 Obesity complicating pregnancy, unspecified trimester: Secondary | ICD-10-CM | POA: Insufficient documentation

## 2024-06-07 DIAGNOSIS — D563 Thalassemia minor: Secondary | ICD-10-CM | POA: Insufficient documentation

## 2024-06-07 DIAGNOSIS — O09899 Supervision of other high risk pregnancies, unspecified trimester: Secondary | ICD-10-CM | POA: Insufficient documentation

## 2024-06-12 ENCOUNTER — Encounter: Payer: Self-pay | Admitting: Obstetrics and Gynecology

## 2024-06-12 ENCOUNTER — Ambulatory Visit (INDEPENDENT_AMBULATORY_CARE_PROVIDER_SITE_OTHER): Admitting: Obstetrics and Gynecology

## 2024-06-12 VITALS — BP 133/83 | HR 84 | Wt 236.0 lb

## 2024-06-12 DIAGNOSIS — R8271 Bacteriuria: Secondary | ICD-10-CM | POA: Diagnosis not present

## 2024-06-12 DIAGNOSIS — O0990 Supervision of high risk pregnancy, unspecified, unspecified trimester: Secondary | ICD-10-CM

## 2024-06-12 DIAGNOSIS — O09299 Supervision of pregnancy with other poor reproductive or obstetric history, unspecified trimester: Secondary | ICD-10-CM

## 2024-06-12 DIAGNOSIS — Z3A19 19 weeks gestation of pregnancy: Secondary | ICD-10-CM | POA: Diagnosis not present

## 2024-06-12 DIAGNOSIS — R200 Anesthesia of skin: Secondary | ICD-10-CM

## 2024-06-12 DIAGNOSIS — R202 Paresthesia of skin: Secondary | ICD-10-CM

## 2024-06-12 DIAGNOSIS — L209 Atopic dermatitis, unspecified: Secondary | ICD-10-CM

## 2024-06-12 NOTE — Progress Notes (Signed)
   PRENATAL VISIT NOTE  Subjective:  [name] [name] is a 33 y.o. H3E6885 at [redacted]w[redacted]d being seen today for ongoing prenatal care.  She is currently monitored for the following issues for this high-risk pregnancy and has Murmur, cardiac; History of gestational hypertension; Hx of preeclampsia, prior pregnancy, currently pregnant; Peritonsillar abscess; Supervision of high risk pregnancy, antepartum; GBS bacteriuria; Hyperemesis gravidarum; Alpha thalassemia silent carrier; History of preterm delivery, currently pregnant; Thalassemia alpha carrier; and Obesity affecting pregnancy, antepartum on their problem list.  Patient reports left palmar numbness for 2 weeks. Denies pain. States symptoms waxes and wanes in intensity.  Contractions: Not present. Vag. Bleeding: None.  Movement: Present. Denies leaking of fluid.   The following portions of the patient's history were reviewed and updated as appropriate: allergies, current medications, past family history, past medical history, past social history, past surgical history and problem list.   Objective:    Vitals:   06/12/24 1402  BP: 133/83  Pulse: 84  Weight: 107 kg    Fetal Status:  Fetal Heart Rate (bpm): 142   Movement: Present    General: Alert, oriented and cooperative. Patient is in no acute distress.  Skin: Skin is warm and dry. No rash noted.   Cardiovascular: Normal heart rate noted  Respiratory: Normal respiratory effort, no problems with respiration noted  Abdomen: Soft, gravid, appropriate for gestational age.  Pain/Pressure: Absent     Pelvic: Cervical exam deferred   Extremities: Normal range of motion.  Edema: None  Mental Status: Normal mood and affect. Normal behavior. Normal judgment and thought content.   Assessment and Plan:  Pregnancy: H3E6885 at [redacted]w[redacted]d 1. Supervision of high risk pregnancy, antepartum (Primary) - BP WNL - Fetal heart rate normal - Daily PNV - Anatomy scan scheduled 06/19/2024 - Genetic counseling  appt on 06/19/2024. FOB has not returned sample as of today.  2. Hx of preeclampsia, prior pregnancy, currently pregnant - Continue aspirin  as directed  3. [redacted] weeks gestation of pregnancy  4. GBS bacteriuria  5. Numbness and tingling in left hand - Discussed supportive measures. Acetaminophen  for discomfort. If symptoms persist or if experiencing pain, a wrist brace may help with symptoms.   6. Atopic dermatitis, unspecified type - OTC steroid cream as directed.  - Apply moisturizer to affected areas frequently.   Preterm labor symptoms and general obstetric precautions including but not limited to vaginal bleeding, contractions, leaking of fluid and fetal movement were reviewed in detail with the patient. Please refer to After Visit Summary for other counseling recommendations.    Future Appointments  Date Time Provider Department Center  06/19/2024  9:00 AM [facility] PROVIDER 1 [facility]  06/19/2024  9:30 AM [facility] US2 [facility]  06/19/2024 10:30 AM [facility] GENETIC COUNSELING RM [facility]    Debby CHRISTELLA Borer, RN

## 2024-06-12 NOTE — Progress Notes (Signed)
 Pt presents for ROB visit. Pt c/o numbness in the hands.

## 2024-06-12 NOTE — Progress Notes (Deleted)
   PRENATAL VISIT NOTE  Subjective:  [name] [name] is a 33 y.o. H3E6885 at [redacted]w[redacted]d being seen today for ongoing prenatal care.  She is currently monitored for the following issues for this {Blank single:19197::high-risk,low-risk} pregnancy and has Murmur, cardiac; History of gestational hypertension; Hx of preeclampsia, prior pregnancy, currently pregnant; Peritonsillar abscess; Supervision of high risk pregnancy, antepartum; GBS bacteriuria; Hyperemesis gravidarum; Alpha thalassemia silent carrier; History of preterm delivery, currently pregnant; Thalassemia alpha carrier; and Obesity affecting pregnancy, antepartum on their problem list.  Patient reports {sx:[id number]}.  Contractions: Not present. Vag. Bleeding: None.  Movement: Present. Denies leaking of fluid.   The following portions of the patient's history were reviewed and updated as appropriate: allergies, current medications, past family history, past medical history, past social history, past surgical history and problem list.   Objective:    Vitals:   06/12/24 1402  BP: 133/83  Pulse: 84  Weight: 236 lb (107 kg)    Fetal Status:  Fetal Heart Rate (bpm): 142   Movement: Present    General: Alert, oriented and cooperative. Patient is in no acute distress.  Skin: Skin is warm and dry. No rash noted.   Cardiovascular: Normal heart rate noted  Respiratory: Normal respiratory effort, no problems with respiration noted  Abdomen: Soft, gravid, appropriate for gestational age.  Pain/Pressure: Absent     Pelvic: {Blank single:19197::Cervical exam performed in the presence of a chaperone,Cervical exam deferred}        Extremities: Normal range of motion.  Edema: None  Mental Status: Normal mood and affect. Normal behavior. Normal judgment and thought content.   Assessment and Plan:  Pregnancy: H3E6885 at [redacted]w[redacted]d 1. Supervision of high risk pregnancy, antepartum (Primary) ***  2. Hx of preeclampsia, prior pregnancy, currently  pregnant   4. GBS bacteriuria ***   {Blank single:19197::Term,Preterm} labor symptoms and general obstetric precautions including but not limited to vaginal bleeding, contractions, leaking of fluid and fetal movement were reviewed in detail with the patient. Please refer to After Visit Summary for other counseling recommendations.   No follow-ups on file.  Future Appointments  Date Time Provider Department Center  06/19/2024  9:00 AM [facility] PROVIDER 1 [facility]  06/19/2024  9:30 AM [facility] US2 [facility]  06/19/2024 10:30 AM [facility] GENETIC COUNSELING RM [facility]    Nidia Daring, FNP

## 2024-06-19 ENCOUNTER — Other Ambulatory Visit: Payer: Self-pay | Admitting: *Deleted

## 2024-06-19 ENCOUNTER — Ambulatory Visit: Attending: Maternal & Fetal Medicine

## 2024-06-19 ENCOUNTER — Ambulatory Visit (HOSPITAL_BASED_OUTPATIENT_CLINIC_OR_DEPARTMENT_OTHER)

## 2024-06-19 ENCOUNTER — Ambulatory Visit (HOSPITAL_BASED_OUTPATIENT_CLINIC_OR_DEPARTMENT_OTHER): Admitting: Maternal & Fetal Medicine

## 2024-06-19 VITALS — BP 140/61 | HR 97

## 2024-06-19 DIAGNOSIS — Z148 Genetic carrier of other disease: Secondary | ICD-10-CM | POA: Diagnosis not present

## 2024-06-19 DIAGNOSIS — Z3A20 20 weeks gestation of pregnancy: Secondary | ICD-10-CM

## 2024-06-19 DIAGNOSIS — O0990 Supervision of high risk pregnancy, unspecified, unspecified trimester: Secondary | ICD-10-CM | POA: Diagnosis present

## 2024-06-19 DIAGNOSIS — O99210 Obesity complicating pregnancy, unspecified trimester: Secondary | ICD-10-CM

## 2024-06-19 DIAGNOSIS — O09292 Supervision of pregnancy with other poor reproductive or obstetric history, second trimester: Secondary | ICD-10-CM

## 2024-06-19 DIAGNOSIS — O09212 Supervision of pregnancy with history of pre-term labor, second trimester: Secondary | ICD-10-CM | POA: Insufficient documentation

## 2024-06-19 DIAGNOSIS — Z363 Encounter for antenatal screening for malformations: Secondary | ICD-10-CM | POA: Diagnosis not present

## 2024-06-19 DIAGNOSIS — D563 Thalassemia minor: Secondary | ICD-10-CM

## 2024-06-19 DIAGNOSIS — O99212 Obesity complicating pregnancy, second trimester: Secondary | ICD-10-CM | POA: Insufficient documentation

## 2024-06-19 DIAGNOSIS — O09299 Supervision of pregnancy with other poor reproductive or obstetric history, unspecified trimester: Secondary | ICD-10-CM

## 2024-06-19 DIAGNOSIS — E669 Obesity, unspecified: Secondary | ICD-10-CM | POA: Diagnosis not present

## 2024-06-19 DIAGNOSIS — Z362 Encounter for other antenatal screening follow-up: Secondary | ICD-10-CM

## 2024-06-19 DIAGNOSIS — O09899 Supervision of other high risk pregnancies, unspecified trimester: Secondary | ICD-10-CM

## 2024-06-19 NOTE — Progress Notes (Signed)
 After review, MFM consult with provider is not indicated for today  [name] Nathanel Pipe, MD 06/19/2024 11:24 AM  [facility]

## 2024-06-19 NOTE — Progress Notes (Signed)
 [facility] at [facility] [address], [address] Phone:  [phone]   Fax:  [phone]      In-Person Genetic Counseling Clinic Note:   I spoke with 33 y.o. [name] [name] today to discuss her carrier screening results. She was referred by [name] Lynwood MATSU, MD. She was accompanied by her friend.   Pregnancy History:    H3E6885. EGA: [redacted]w[redacted]d by US . EDD: 11/04/2024. [name] [name] has four children and had one IAB for personal reasons. Personal history of HTN. Denies major personal health concerns. Denies bleeding, infections, and fevers in this pregnancy. Reports reducing tobacco use since the beginning of pregnancy and is now smoking less than 1/2 pack of cigarettes per day. Reviewed potential effects of smoking during pregnancy and current ACOG recommendations against prenatal tobacco exposure. Denies using alcohol or street drugs in this pregnancy.   Family History:    A three-generation pedigree was created and scanned into Epic under the Media tab.  Patient reports her 76 yo son had various health concerns and may have an autoimmune disease; he had positive SSA antibodies. He is being followed by rheumatology and has not received an official diagnosis.  We reviewed this may be genetic or multifactorial in nature.  Without additional information, recurrence risk is difficult to estimate.  Patient reports her maternal half brother has 2 daughters with autism, no known genetic testing performed.  She reports a maternal half sister has a son with autism.  He also has a concern involving his spine where he cannot turn his head appropriately.  She reports he also has brachycephaly.  He is currently receiving various therapies but no known genetic diagnosis was made.  We reviewed this may be part of syndrome or the symptoms may be unrelated. We discussed that we are unable to directly test for autism in a pregnancy. Genetic testing for individuals with a  clinical diagnosis of autism yields an explanation in only about 20% of cases, and the remaining 80% of cases are left with unknown etiology. Having an affected family member may increase the chance that [name] [name]'s children will have autism; however, without genetic testing performed on affected family members, it is difficult to assess risk to the pregnancy and other family members. The risk may be up to 50% in the case of an identified genetic cause. We also discussed and offered screening for fragile X syndrome, which is one of the most common causes of inherited intellectual disability and autism in males. Fragile X syndrome affects females as well.  [name] [name] is considering screening for fragile X syndrome.  She will inform us  during her next ultrasound if she wishes to pursue screening for fragile X syndrome.  Patient reports that [name] [name]'s father has a history of either colon or pancreatic cancer.  Different types of cancer can have a hereditary component that increases an individual's susceptibility to develop cancer; however, most cancers occur by chance due to a combination of genetics and the environment. It is recommended [name] [name] inform his PCP about his family history so they can discuss appropriate screening and testing options, including a possible referral to cancer genetic counseling.    Maternal ethnicity reported as Black and paternal ethnicity reported as Air traffic controller. Denies Ashkenazi Jewish ancestry.  Family history not remarkable for consanguinity, individuals with birth defects, intellectual disability, autism spectrum disorder, multiple spontaneous abortions, still births, or unexplained neonatal death.   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 Carriers may have mild anemia. She screened negative for the other three conditions (CF, SMA, and  beta-hemoglobinopathies). Negative carrier screening reduces but does not eliminate the chance of being a carrier. Please see report for details.  We reviewed the genetics of alpha thalassemia, autosomal recessive mode of inheritance, and clinical features of these conditions. We reviewed that Raguel will pass down one functional copy of the alpha globin gene and one deletion (-?) in each pregnancy. Therefore, this pregnancy is not at increased risk for hemoglobin Bart's due to four deletions of the alpha globin genes (--/--) regardless of her reproductive partner's carrier status. The pregnancy may be at increased risk for hemoglobin H disease (50%) if her partner is an alpha thalassemia carrier in the cis configuration (--/??). We reviewed that this is more common in [place] Asian populations and less common in those with Black ancestry.  Given these results, we discussed and offered carrier screening for [name] [name]'s reproductive partner. We reviewed the benefits and limitations of carrier screening and that it can detect most but not all carriers.  She states that [name] [name] plans to collect a saliva sample today at her OB clinic.  We discussed that [name] [name] can also come to our clinic for a blood draw.  She stated that [name] [name] will call our clinic if he wishes to set up an appointment for blood draw.  We reviewed that if both were found to be carriers, prenatal diagnosis through amniocentesis would be available. We reviewed the technical aspects, benefits, risks, and limitations of amniocentesis including the 1 in 500 risk for miscarriage. We also discussed that testing can be completed postnatally. Of note, the [place]  Newborn Screening (NBS) Program screens all newborn babies for cystic fibrosis, spinal muscular atrophy, hemoglobinopathies, and numerous other conditions. It does not screen for alpha thalassemia.   Newborn Screening. The [place]  Newborn Screening (NBS) program will screen all newborn  babies for cystic fibrosis, spinal muscular atrophy, hemoglobinopathies, and numerous other conditions.  Previous Testing Completed:  Low risk NIPS: [name] [name] previously completed noninvasive prenatal screening (NIPS) in this pregnancy. The result is low risk, consistent with a female fetus. This screening significantly reduces but does not eliminate the chance that the current pregnancy has Down syndrome (trisomy 31), trisomy 71, trisomy 39, and common sex chromosome conditions. Please see report for details. There are many genetic conditions that cannot be detected by NIPS.   Negative ms-AFP screening: [name] [name] previously completed a maternal serum AFP screen in this pregnancy. The result is screen negative. Please see report for details. A negative result reduces the risk that the current pregnancy has an open neural tube defect. Closed neural tube defects and some open defects may not be detected by this screen.   Plan of Care:   [name] [name] plans to pursue carrier screening for alpha thalassemia today at patient's [facility] clinic. He can also contact our clinic to schedule a blood draw if he desires. Declines amniocentesis. Patient will inform us  during her next ultrasound if she wishes to pursue carrier screening for fragile X syndrome.   Informed consent was obtained. All questions were answered.   60 minutes were spent on the date of the encounter in service to the patient including preparation, face-to-face consultation, discussion of test reports and available next steps, pedigree construction, genetic risk assessment, documentation, and care coordination.    Thank you for  sharing in the care of [name] [name] with us .  Please do not hesitate to contact us  at [phone] if you have any questions.   Lauraine Bodily, MS, [facility] Certified Genetic Counselor   Genetic counseling student involved in appointment: No.

## 2024-06-27 ENCOUNTER — Encounter (INDEPENDENT_AMBULATORY_CARE_PROVIDER_SITE_OTHER): Payer: Self-pay

## 2024-07-10 ENCOUNTER — Ambulatory Visit (INDEPENDENT_AMBULATORY_CARE_PROVIDER_SITE_OTHER): Admitting: Physician Assistant

## 2024-07-10 ENCOUNTER — Other Ambulatory Visit: Payer: Self-pay | Admitting: Physician Assistant

## 2024-07-10 VITALS — BP 141/94 | HR 95 | Wt 245.1 lb

## 2024-07-10 DIAGNOSIS — O99210 Obesity complicating pregnancy, unspecified trimester: Secondary | ICD-10-CM

## 2024-07-10 DIAGNOSIS — O0990 Supervision of high risk pregnancy, unspecified, unspecified trimester: Secondary | ICD-10-CM | POA: Diagnosis not present

## 2024-07-10 DIAGNOSIS — O99891 Other specified diseases and conditions complicating pregnancy: Secondary | ICD-10-CM

## 2024-07-10 DIAGNOSIS — O09299 Supervision of pregnancy with other poor reproductive or obstetric history, unspecified trimester: Secondary | ICD-10-CM | POA: Diagnosis not present

## 2024-07-10 DIAGNOSIS — M549 Dorsalgia, unspecified: Secondary | ICD-10-CM

## 2024-07-10 DIAGNOSIS — Z8759 Personal history of other complications of pregnancy, childbirth and the puerperium: Secondary | ICD-10-CM

## 2024-07-10 DIAGNOSIS — Z3A23 23 weeks gestation of pregnancy: Secondary | ICD-10-CM | POA: Diagnosis not present

## 2024-07-10 DIAGNOSIS — O09899 Supervision of other high risk pregnancies, unspecified trimester: Secondary | ICD-10-CM

## 2024-07-10 DIAGNOSIS — R030 Elevated blood-pressure reading, without diagnosis of hypertension: Secondary | ICD-10-CM

## 2024-07-10 MED ORDER — NIFEDIPINE ER OSMOTIC RELEASE 30 MG PO TB24: 30.0000 mg | ORAL_TABLET | Freq: Every day | ORAL | 3 refills | Status: DC

## 2024-07-10 NOTE — Progress Notes (Signed)
   PRENATAL VISIT NOTE  Subjective:  [name] [name] is a 33 y.o. H3E6885 at [redacted]w[redacted]d being seen today for ongoing prenatal care.  She is currently monitored for the following issues for this high-risk pregnancy and has Murmur, cardiac; History of gestational hypertension; Hx of preeclampsia, prior pregnancy, currently pregnant; Peritonsillar abscess; Supervision of high risk pregnancy, antepartum; GBS bacteriuria; Hyperemesis gravidarum; History of preterm delivery, currently pregnant; Thalassemia alpha carrier; and Obesity affecting pregnancy, antepartum on their problem list.  Patient reports back pain since pregnancy.  Contractions: Not present. Vag. Bleeding: None.  Movement: Present. Denies leaking of fluid.   The following portions of the patient's history were reviewed and updated as appropriate: allergies, current medications, past family history, past medical history, past social history, past surgical history and problem list.   Objective:    Vitals:   07/10/24 1015 07/10/24 1022  BP: (!) 135/91 (!) 141/94  Pulse: 92 95  Weight: 245 lb 1.6 oz (111.2 kg)     Fetal Status:  Fetal Heart Rate (bpm): 141 Fundal Height: 23 cm Movement: Present    General: Alert, oriented and cooperative. Patient is in no acute distress.  Skin: Skin is warm and dry. No rash noted.   Cardiovascular: Normal heart rate noted  Respiratory: Normal respiratory effort, no problems with respiration noted  Abdomen: Soft, gravid, appropriate for gestational age.  Pain/Pressure: Present (Back, hips)     Pelvic: Cervical exam deferred        Extremities: Normal range of motion.  Edema: None  Mental Status: Normal mood and affect. Normal behavior. Normal judgment and thought content.   Assessment and Plan:  Pregnancy: H3E6885 at [redacted]w[redacted]d  1. Supervision of high risk pregnancy, antepartum (Primary) Patient doing well, feeling regular fetal movement BP, FHR, FH appropriate   2. [redacted] weeks gestation of  pregnancy Anticipatory guidance about next visits/weeks of pregnancy given.   3. Hx of preeclampsia, prior pregnancy, currently pregnant 4. History of gestational hypertension 5. Elevated blood pressure reading Patient with BP 140/61 one month ago and now 141/94 on no medication and asymptomatic. Likely gHTN, but will get labs to reassure no pre-E.  - Protein / creatinine ratio, urine - Comp Met (CMET) - CBC - NIFEdipine  (PROCARDIA -XL/NIFEDICAL-XL) 30 MG 24 hr tablet; Take 1 tablet (30 mg total) by mouth daily.  Dispense: 30 tablet; Refill: 3  8. Back pain affecting pregnancy - Ambulatory referral to Physical Therapy   6. History of preterm delivery, currently pregnant No ssxs PTL  7. Obesity affecting pregnancy, antepartum, unspecified obesity type Continue ASA  Preterm labor symptoms and general obstetric precautions including but not limited to vaginal bleeding, contractions, leaking of fluid and fetal movement were reviewed in detail with the patient.  Please refer to After Visit Summary for other counseling recommendations.   Return in about 4 weeks (around 08/07/2024) for HOB+GTT.  Future Appointments  Date Time Provider Department Center  07/17/2024 11:15 AM [facility] PROVIDER 1 [facility]  07/17/2024 11:30 AM [facility] US5 [facility]  08/06/2024  8:20 AM [facility] None  08/06/2024  9:35 AM Erik Kieth BROCKS, MD [facility] None    Jorene FORBES Moats, PA-C

## 2024-07-10 NOTE — Progress Notes (Signed)
 ROBDietitian, becoming harder to work. Elevated BP x2. Hx GHTN and preeclampsia. Stopped taking aspirin , d/t making her nauseous. Denies any symptoms.

## 2024-07-11 LAB — COMPREHENSIVE METABOLIC PANEL WITH GFR
ALT: 12 IU/L (ref 0–32)
AST: 14 IU/L (ref 0–40)
Albumin: 3.3 g/dL — ABNORMAL LOW (ref 3.9–4.9)
Alkaline Phosphatase: 56 IU/L (ref 41–116)
BUN/Creatinine Ratio: 6 — ABNORMAL LOW (ref 9–23)
BUN: 3 mg/dL — ABNORMAL LOW (ref 6–20)
Bilirubin Total: <0.2 mg/dL (ref 0.0–1.2)
CO2: 18 mmol/L — ABNORMAL LOW (ref 20–29)
Calcium: 8.9 mg/dL (ref 8.7–10.2)
Chloride: 104 mmol/L (ref 96–106)
Creatinine, Ser: 0.48 mg/dL — ABNORMAL LOW (ref 0.57–1.00)
Globulin, Total: 2.8 g/dL (ref 1.5–4.5)
Glucose: 70 mg/dL (ref 70–99)
Potassium: 3.9 mmol/L (ref 3.5–5.2)
Sodium: 135 mmol/L (ref 134–144)
Total Protein: 6.1 g/dL (ref 6.0–8.5)
eGFR: 130 mL/min/1.73 (ref >59)

## 2024-07-11 LAB — CBC
Hematocrit: 32.7 % — ABNORMAL LOW (ref 34.0–46.6)
Hemoglobin: 9.8 g/dL — ABNORMAL LOW (ref 11.1–15.9)
MCH: 22.6 pg — ABNORMAL LOW (ref 26.6–33.0)
MCHC: 30 g/dL — ABNORMAL LOW (ref 31.5–35.7)
MCV: 76 fL — ABNORMAL LOW (ref 79–97)
Platelets: 374 x10E3/uL (ref 150–450)
RBC: 4.33 x10E6/uL (ref 3.77–5.28)
RDW: 15.2 % (ref 11.7–15.4)
WBC: 10.1 x10E3/uL (ref 3.4–10.8)

## 2024-07-11 LAB — PROTEIN / CREATININE RATIO, URINE
Creatinine, Urine: 177.8 mg/dL
Protein, Ur: 29.3 mg/dL
Protein/Creat Ratio: 165 mg/g{creat} (ref 0–200)

## 2024-07-12 ENCOUNTER — Other Ambulatory Visit: Payer: Self-pay | Admitting: Genetic Counselor

## 2024-07-12 DIAGNOSIS — Z006 Encounter for examination for normal comparison and control in clinical research program: Secondary | ICD-10-CM

## 2024-07-15 ENCOUNTER — Ambulatory Visit: Payer: Self-pay | Admitting: Physician Assistant

## 2024-07-16 ENCOUNTER — Other Ambulatory Visit: Payer: Self-pay

## 2024-07-16 MED ORDER — FERROUS SULFATE 325 (65 FE) MG PO TABS: 325.0000 mg | ORAL_TABLET | ORAL | 3 refills | Status: DC

## 2024-07-17 ENCOUNTER — Ambulatory Visit (HOSPITAL_BASED_OUTPATIENT_CLINIC_OR_DEPARTMENT_OTHER)

## 2024-07-17 ENCOUNTER — Other Ambulatory Visit: Payer: Self-pay | Admitting: *Deleted

## 2024-07-17 ENCOUNTER — Ambulatory Visit: Attending: Maternal & Fetal Medicine | Admitting: Obstetrics and Gynecology

## 2024-07-17 VITALS — BP 126/78

## 2024-07-17 DIAGNOSIS — E66813 Obesity, class 3: Secondary | ICD-10-CM

## 2024-07-17 DIAGNOSIS — Z362 Encounter for other antenatal screening follow-up: Secondary | ICD-10-CM | POA: Insufficient documentation

## 2024-07-17 DIAGNOSIS — E669 Obesity, unspecified: Secondary | ICD-10-CM

## 2024-07-17 DIAGNOSIS — O09299 Supervision of pregnancy with other poor reproductive or obstetric history, unspecified trimester: Secondary | ICD-10-CM

## 2024-07-17 DIAGNOSIS — O99212 Obesity complicating pregnancy, second trimester: Secondary | ICD-10-CM

## 2024-07-17 DIAGNOSIS — O09212 Supervision of pregnancy with history of pre-term labor, second trimester: Secondary | ICD-10-CM | POA: Insufficient documentation

## 2024-07-17 DIAGNOSIS — O99012 Anemia complicating pregnancy, second trimester: Secondary | ICD-10-CM

## 2024-07-17 DIAGNOSIS — O09292 Supervision of pregnancy with other poor reproductive or obstetric history, second trimester: Secondary | ICD-10-CM | POA: Insufficient documentation

## 2024-07-17 DIAGNOSIS — D563 Thalassemia minor: Secondary | ICD-10-CM | POA: Diagnosis not present

## 2024-07-17 DIAGNOSIS — O0990 Supervision of high risk pregnancy, unspecified, unspecified trimester: Secondary | ICD-10-CM

## 2024-07-17 DIAGNOSIS — Z148 Genetic carrier of other disease: Secondary | ICD-10-CM | POA: Insufficient documentation

## 2024-07-17 DIAGNOSIS — Z3A24 24 weeks gestation of pregnancy: Secondary | ICD-10-CM

## 2024-07-17 DIAGNOSIS — O99210 Obesity complicating pregnancy, unspecified trimester: Secondary | ICD-10-CM

## 2024-07-17 DIAGNOSIS — E6689 Other obesity not elsewhere classified: Secondary | ICD-10-CM

## 2024-07-17 DIAGNOSIS — O358XX0 Maternal care for other (suspected) fetal abnormality and damage, not applicable or unspecified: Secondary | ICD-10-CM

## 2024-07-17 DIAGNOSIS — O210 Mild hyperemesis gravidarum: Secondary | ICD-10-CM

## 2024-07-17 NOTE — Progress Notes (Signed)
 Maternal-Fetal Medicine Consultation  Name: [name] [name]  MRN: [id number]  GA: H3E6885 [redacted]w[redacted]d   Patient is here for completion of fetal anatomy.  Her high risk problems include: -Pregravid BMI 41. - History of preeclampsia. -Patient is a carrier for alpha thalassemia (a-/a-).  Ultrasound Normal fetal growth and amniotic fluid.  Fetal anatomical survey was completed and appears normal.  Our concerns include: History of preeclampsia I counseled the patient that recurrence of preeclampsia is seen in about 25% to 50% of cases.  I discussed the benefit of low-dose aspirin  prophylaxis that delays or prevents preeclampsia.  Patient is not taking aspirin .  I have recommended that she take aspirin  and continue till delivery.  Pregravid BMI 41 Grade 3 obesity is independently associated with increased risk of stillbirth (2.5- to 3-fold), but the absolute risk is very small.  I discussed protocol of weekly antenatal testing from [redacted] weeks gestation until delivery. Obesity is associated with increased risk for gestational diabetes and gestational hypertension. Ultrasound has limitations in the resolution of ultrasound images and fetal anomalies may be missed.  Weight loss is not advised in pregnancy and a weight gain of 11 to 20 pounds is reasonable.  I explained the components of antenatal testing (biophysical profile) and that if it reassuring, the risk of stillbirth is less than 1 per 1,000 births in one week. We recommend weekly antenatal testing from [redacted] weeks gestation until delivery.  Alpha thalassemia carrier Patient reported that her partner had carrier screening.  We checked his name but could not find the results.  We informed the patient and encouraged her to have her partner screened. Alternatively, amniocentesis may be performed to determine the fetal carrier status.  I explained the procedure and the possible complication of miscarriage in 1 and 500 procedures.  Patient opted not have  amniocentesis.  Recommendations - An appointment was made for her to return in 10 weeks for fetal growth assessment and BPP. - Low-dose aspirin  prophylaxis.     Consultation including face-to-face (more than 50%) counseling 30 minutes.

## 2024-08-06 ENCOUNTER — Ambulatory Visit: Admitting: Obstetrics and Gynecology

## 2024-08-06 ENCOUNTER — Other Ambulatory Visit

## 2024-08-06 VITALS — BP 125/89 | HR 88 | Wt 255.0 lb

## 2024-08-06 DIAGNOSIS — D563 Thalassemia minor: Secondary | ICD-10-CM | POA: Diagnosis not present

## 2024-08-06 DIAGNOSIS — O0990 Supervision of high risk pregnancy, unspecified, unspecified trimester: Secondary | ICD-10-CM | POA: Diagnosis not present

## 2024-08-06 DIAGNOSIS — Z3492 Encounter for supervision of normal pregnancy, unspecified, second trimester: Secondary | ICD-10-CM | POA: Diagnosis not present

## 2024-08-06 DIAGNOSIS — Z23 Encounter for immunization: Secondary | ICD-10-CM

## 2024-08-06 DIAGNOSIS — O09299 Supervision of pregnancy with other poor reproductive or obstetric history, unspecified trimester: Secondary | ICD-10-CM

## 2024-08-06 DIAGNOSIS — O09899 Supervision of other high risk pregnancies, unspecified trimester: Secondary | ICD-10-CM

## 2024-08-06 DIAGNOSIS — O99210 Obesity complicating pregnancy, unspecified trimester: Secondary | ICD-10-CM

## 2024-08-06 DIAGNOSIS — Z3A27 27 weeks gestation of pregnancy: Secondary | ICD-10-CM

## 2024-08-06 DIAGNOSIS — R8271 Bacteriuria: Secondary | ICD-10-CM

## 2024-08-06 DIAGNOSIS — I10 Essential (primary) hypertension: Secondary | ICD-10-CM

## 2024-08-06 NOTE — Addendum Note (Signed)
 Addended by: JERRYE AREA D on: 08/06/2024 09:27 AM   Modules accepted: Orders

## 2024-08-06 NOTE — Progress Notes (Signed)
   PRENATAL VISIT NOTE  Subjective:  [name] [name] is a 33 y.o. H3E6885 at [redacted]w[redacted]d being seen today for ongoing prenatal care.  She is currently monitored for the following issues for this high-risk pregnancy and has Murmur, cardiac; Hx of preeclampsia, prior pregnancy, currently pregnant; Peritonsillar abscess; Supervision of high risk pregnancy, antepartum; GBS bacteriuria; History of preterm delivery, currently pregnant; Thalassemia alpha carrier; Obesity affecting pregnancy, antepartum; and Chronic hypertension on their problem list.  Patient reports backache.  Contractions: Not present. Vag. Bleeding: None.  Movement: Present. Denies leaking of fluid.   The following portions of the patient's history were reviewed and updated as appropriate: allergies, current medications, past family history, past medical history, past social history, past surgical history and problem list.   Objective:   Vitals:   08/06/24 0832  BP: 125/89  Pulse: 88  Weight: 255 lb (115.7 kg)    Fetal Status:  Fetal Heart Rate (bpm): 140   Movement: Present    General: Alert, oriented and cooperative. Patient is in no acute distress.  Skin: Skin is warm and dry. No rash noted.   Cardiovascular: Normal heart rate noted  Respiratory: Normal respiratory effort, no problems with respiration noted  Abdomen: Soft, gravid, appropriate for gestational age.  Pain/Pressure: Present     Pelvic: Cervical exam deferred        Extremities: Normal range of motion.     Mental Status: Normal mood and affect. Normal behavior. Normal judgment and thought content.   Assessment and Plan:  Pregnancy: H3E6885 at [redacted]w[redacted]d 1. Supervision of high risk pregnancy, antepartum (Primary) Discussed flu and tdap. Declines flu, and accepts tdap.  Drank a sip of soda at 1am. Discussed 2 hr vs 1 hr and risk of abnormal. No h/o GDM. We agreed 1 hr would be reasonable.  - Tdap vaccine greater than or equal to 7yo IM  2. [redacted] weeks gestation of  pregnancy - Tdap vaccine greater than or equal to 7yo IM - Glucose tolerance, 1 hour - CBC - RPR - HIV Antibody (routine testing w rflx)  3. Chronic hypertension Started on Nifed at 23 weeks. Had elevated BP at 20w (140/61). Reviewed BP today is well controlled and not too low. Discussed benefits of treatment of BP. Patient agreeable.   4. Thalassemia alpha carrier  5. Obesity affecting pregnancy, antepartum, unspecified obesity type Getting serial US  with MFM. Growth was 75%ile. Nex is due 12/23.   6. Hx of preeclampsia, prior pregnancy, currently pregnant  7. History of preterm delivery, currently pregnant Iatrogenic.   8. GBS bacteriuria PCN in labor.   Preterm labor symptoms and general obstetric precautions including but not limited to vaginal bleeding, contractions, leaking of fluid and fetal movement were reviewed in detail with the patient. Please refer to After Visit Summary for other counseling recommendations.   No follow-ups on file.  Future Appointments  Date Time Provider Department Center  08/06/2024  9:35 AM Cleatus Moccasin, MD [facility] None  09/24/2024  1:15 PM [facility] PROVIDER 1 [facility]  09/24/2024  1:30 PM [facility] US4 [facility]  10/02/2024 10:30 AM [facility] NURSE [facility]  10/02/2024 10:45 AM [facility] NST [facility]  10/08/2024  1:15 PM [facility] PROVIDER 1 [facility]  10/08/2024  1:30 PM [facility] US5 [facility]    Moccasin Cleatus, MD

## 2024-08-06 NOTE — Progress Notes (Signed)
 Pt complains of back pain and HA's. Pt had small sip of soda, can reschedule GTT.

## 2024-08-07 ENCOUNTER — Ambulatory Visit: Payer: Self-pay | Admitting: Obstetrics and Gynecology

## 2024-08-07 DIAGNOSIS — O0990 Supervision of high risk pregnancy, unspecified, unspecified trimester: Secondary | ICD-10-CM

## 2024-08-07 DIAGNOSIS — O99019 Anemia complicating pregnancy, unspecified trimester: Secondary | ICD-10-CM

## 2024-08-07 LAB — CBC
Hematocrit: 32.5 % — ABNORMAL LOW (ref 34.0–46.6)
Hemoglobin: 9.9 g/dL — ABNORMAL LOW (ref 11.1–15.9)
MCH: 23.2 pg — ABNORMAL LOW (ref 26.6–33.0)
MCHC: 30.5 g/dL — ABNORMAL LOW (ref 31.5–35.7)
MCV: 76 fL — ABNORMAL LOW (ref 79–97)
Platelets: 383 x10E3/uL (ref 150–450)
RBC: 4.27 x10E6/uL (ref 3.77–5.28)
RDW: 15.5 % — ABNORMAL HIGH (ref 11.7–15.4)
WBC: 10.2 x10E3/uL (ref 3.4–10.8)

## 2024-08-07 LAB — GLUCOSE TOLERANCE, 1 HOUR: Glucose, 1Hr PP: 129 mg/dL (ref 70–199)

## 2024-08-08 LAB — HIV ANTIBODY (ROUTINE TESTING W REFLEX): HIV Screen 4th Generation wRfx: NONREACTIVE

## 2024-08-08 LAB — RPR: RPR Ser Ql: NONREACTIVE

## 2024-08-22 ENCOUNTER — Encounter: Payer: Self-pay | Admitting: Obstetrics and Gynecology

## 2024-08-22 ENCOUNTER — Ambulatory Visit (INDEPENDENT_AMBULATORY_CARE_PROVIDER_SITE_OTHER): Admitting: Obstetrics and Gynecology

## 2024-08-22 VITALS — BP 134/85 | HR 114 | Wt 257.0 lb

## 2024-08-22 DIAGNOSIS — O99210 Obesity complicating pregnancy, unspecified trimester: Secondary | ICD-10-CM | POA: Diagnosis not present

## 2024-08-22 DIAGNOSIS — I10 Essential (primary) hypertension: Secondary | ICD-10-CM | POA: Diagnosis not present

## 2024-08-22 DIAGNOSIS — O0990 Supervision of high risk pregnancy, unspecified, unspecified trimester: Secondary | ICD-10-CM | POA: Diagnosis not present

## 2024-08-22 DIAGNOSIS — R8271 Bacteriuria: Secondary | ICD-10-CM

## 2024-08-22 DIAGNOSIS — O99013 Anemia complicating pregnancy, third trimester: Secondary | ICD-10-CM | POA: Diagnosis not present

## 2024-08-22 DIAGNOSIS — O09299 Supervision of pregnancy with other poor reproductive or obstetric history, unspecified trimester: Secondary | ICD-10-CM

## 2024-08-22 NOTE — Progress Notes (Signed)
 ROB, c/o SOB, chest pains x 2 weeks, cramps, extra discharge. Denies odor, itching, burning.

## 2024-08-22 NOTE — Progress Notes (Signed)
 PRENATAL VISIT NOTE  Subjective:  [name] [name] is a 33 y.o. H3E6885 at [redacted]w[redacted]d being seen today for ongoing prenatal care.  She is currently monitored for the following issues for this high-risk pregnancy and has Murmur, cardiac; Anemia affecting pregnancy; Hx of preeclampsia, prior pregnancy, currently pregnant; Peritonsillar abscess; Supervision of high risk pregnancy, antepartum; GBS bacteriuria; History of preterm delivery, currently pregnant; Thalassemia alpha carrier; Obesity affecting pregnancy, antepartum; and Chronic hypertension on their problem list.  Patient reports chest pain, palpitations and SOB at rest.  Contractions: Irritability. Vag. Bleeding: None.  Movement: Present. Denies leaking of fluid.   The following portions of the patient's history were reviewed and updated as appropriate: allergies, current medications, past family history, past medical history, past social history, past surgical history and problem list.   Objective:   Vitals:   08/22/24 1359  BP: 134/85  Pulse: (!) 114  Weight: 257 lb (116.6 kg)    Fetal Status:  Fetal Heart Rate (bpm): 158 Fundal Height: 30 cm Movement: Present    General: Alert, oriented and cooperative. Patient is in no acute distress.  Skin: Skin is warm and dry. No rash noted.   Cardiovascular: Normal heart rate noted  Respiratory: Normal respiratory effort, no problems with respiration noted  Abdomen: Soft, gravid, appropriate for gestational age.  Pain/Pressure: Present     Pelvic: Cervical exam deferred        Extremities: Normal range of motion.  Edema: Trace  Mental Status: Normal mood and affect. Normal behavior. Normal judgment and thought content.      04/17/2024    3:28 PM 04/01/2024    1:55 PM 09/12/2022    9:52 AM  Depression screen PHQ 2/9  Decreased Interest 2 0 3  Down, Depressed, Hopeless 0 0 1  PHQ - 2 Score 2 0 4  Altered sleeping 2 0 1  Tired, decreased energy 2 0 1  Change in appetite 3 0 1  Feeling  bad or failure about yourself  0 0 0  Trouble concentrating 0 1 0  Moving slowly or fidgety/restless 0 1 0  Suicidal thoughts 0 0 0  PHQ-9 Score 9  2  7       Data saved with a previous flowsheet row definition        04/17/2024    3:28 PM 04/01/2024    1:56 PM 09/12/2022    9:53 AM  GAD 7 : Generalized Anxiety Score  Nervous, Anxious, on Edge 1 3 1   Control/stop worrying 1 2 0  Worry too much - different things 1 2 1   Trouble relaxing 0 0 1  Restless 0 0 1  Easily annoyed or irritable 0 1 0  Afraid - awful might happen 0 1 0  Total GAD 7 Score 3 9 4     Assessment and Plan:  Pregnancy: H3E6885 at [redacted]w[redacted]d 1. Supervision of high risk pregnancy, antepartum (Primary) Patient is doing well overall with the exception of her chest pain complaints Patient is being referred to cardiology Patient is requesting to start a leave of absence from work due to her symptoms. 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   2. Chronic hypertension Stable on procardia  Continue ASA Follow up growth ultrasound per MFM schedule  3. Obesity affecting pregnancy, antepartum, unspecified obesity type   4. Anemia affecting pregnancy in third trimester Continue iron  supplement  5. GBS bacteriuria Prophylaxis in labor  6. Hx of preeclampsia, prior pregnancy, currently pregnant Asymptomatic  at this time  Preterm labor symptoms and general obstetric precautions including but not limited to vaginal bleeding, contractions, leaking of fluid and fetal movement were reviewed in detail with the patient. Please refer to After Visit Summary for other counseling recommendations.   Return in about 2 weeks (around 09/05/2024) for in person, ROB, High risk.  Future Appointments  Date Time Provider Department Center  09/04/2024  1:50 PM Trudy Leeroy NOVAK, MD [facility] None  09/24/2024  1:15 PM [facility] PROVIDER 1 [facility]  09/24/2024  1:30 PM [facility] US4 [facility]  10/02/2024 10:30 AM  [facility] NURSE [facility]  10/02/2024 10:45 AM [facility] NST [facility]  10/08/2024  1:15 PM [facility] PROVIDER 1 [facility]  10/08/2024  1:30 PM [facility] US5 [facility]    Winton Felt, MD

## 2024-09-04 ENCOUNTER — Ambulatory Visit

## 2024-09-04 VITALS — BP 130/80 | HR 97 | Wt 265.0 lb

## 2024-09-04 DIAGNOSIS — H9311 Tinnitus, right ear: Secondary | ICD-10-CM

## 2024-09-04 DIAGNOSIS — O09299 Supervision of pregnancy with other poor reproductive or obstetric history, unspecified trimester: Secondary | ICD-10-CM

## 2024-09-04 DIAGNOSIS — O0990 Supervision of high risk pregnancy, unspecified, unspecified trimester: Secondary | ICD-10-CM

## 2024-09-04 DIAGNOSIS — O99210 Obesity complicating pregnancy, unspecified trimester: Secondary | ICD-10-CM

## 2024-09-04 DIAGNOSIS — I10 Essential (primary) hypertension: Secondary | ICD-10-CM | POA: Diagnosis not present

## 2024-09-04 DIAGNOSIS — R8271 Bacteriuria: Secondary | ICD-10-CM

## 2024-09-04 DIAGNOSIS — R0789 Other chest pain: Secondary | ICD-10-CM

## 2024-09-04 NOTE — Progress Notes (Signed)
 ROB, c/o pulsating sound in right ear x 1 week, HA 8/10, sharp chest pains 8/10

## 2024-09-04 NOTE — Progress Notes (Signed)
 Subjective:  [name] [name] is a 33 y.o. H3E6885 at [redacted]w[redacted]d being seen today for ongoing prenatal care.  She is currently monitored for the following issues for this high-risk pregnancy and has Murmur, cardiac; Anemia affecting pregnancy; Hx of preeclampsia, prior pregnancy, currently pregnant; Peritonsillar abscess; Supervision of high risk pregnancy, antepartum; GBS bacteriuria; History of preterm delivery, currently pregnant; Thalassemia alpha carrier; Obesity affecting pregnancy, antepartum; and Chronic hypertension on their problem list.  Patient reports whirring sound like a heartbeat in right ear which triggers headaches and lighting chest pains. Chest pains have improved since stopping work. But worried about this with her history of high BP. BP at home have been 120-130s/80s. Taking Nifedipine . Contractions: Not present. Vag. Bleeding: None.  Movement: Present. Denies leaking of fluid.   The following portions of the patient's history were reviewed and updated as appropriate: allergies, current medications, past family history, past medical history, past social history, past surgical history and problem list. Problem list updated.  Objective:   Vitals:   09/04/24 1357  BP: 130/80  Pulse: 97  Weight: 265 lb (120.2 kg)    Fetal Status: Fetal Heart Rate (bpm): 142   Movement: Present     General:  Alert, oriented and cooperative. Patient is in no acute distress.  Skin: Skin is warm and dry. No rash noted.   Respiratory: Normal respiratory effort, no problems with respiration noted  Abdomen: Soft, gravid, appropriate for gestational age. Pain/Pressure: Absent     Pelvic: Vag. Bleeding: None     Cervical exam deferred        Extremities: Normal range of motion.  Edema: Trace  Mental Status: Normal mood and affect. Normal behavior. Normal judgment and thought content.   Urinalysis:      Assessment and Plan:  Pregnancy: H3E6885 at [redacted]w[redacted]d  1. Supervision of high risk pregnancy,  antepartum (Primary) Doing well  2. Chronic hypertension Well controlled Nifedipine  30mg  & ASA. Following w/ MFM  3. Obesity affecting pregnancy, antepartum, unspecified obesity type Following with MFM  4. GBS bacteriuria -abx during labor  5. Hx of preeclampsia, prior pregnancy, currently pregnant See above  6. Right-sided tinnitus - Ambulatory referral to ENT  7. Other chest pain Hx not CV related, most likely MSK related due to relaxin hormone. reassurance   Preterm labor symptoms and general obstetric precautions including but not limited to vaginal bleeding, contractions, leaking of fluid and fetal movement were reviewed in detail with the patient. Please refer to After Visit Summary for other counseling recommendations.  No follow-ups on file.   Trudy Leeroy NOVAK, MD

## 2024-09-13 ENCOUNTER — Encounter (INDEPENDENT_AMBULATORY_CARE_PROVIDER_SITE_OTHER): Payer: Self-pay | Admitting: Physician Assistant

## 2024-09-13 ENCOUNTER — Ambulatory Visit (INDEPENDENT_AMBULATORY_CARE_PROVIDER_SITE_OTHER): Admitting: Physician Assistant

## 2024-09-13 VITALS — BP 133/78 | HR 99 | Ht 66.0 in | Wt 265.0 lb

## 2024-09-13 DIAGNOSIS — H93A1 Pulsatile tinnitus, right ear: Secondary | ICD-10-CM | POA: Diagnosis not present

## 2024-09-13 NOTE — Progress Notes (Signed)
 Dear Dr. Trudy, Here is my assessment for our mutual patient, [name] [name]. Thank you for allowing me the opportunity to care for your patient. Please do not hesitate to contact me should you have any other questions. Sincerely, Chyrl Cohen PA-C  Otolaryngology Clinic Note Referring provider: Dr. Trudy HPI:  [name] [name] is a 33 y.o. female kindly referred by Dr. Trudy   Discussed the use of AI scribe software for clinical note transcription with the patient, who gave verbal consent to proceed.  History of Present Illness   [name] [name] is a 33 year old female  [phone] at 32 weeks who presents with pulsatile tinnitus in the right ear during pregnancy.   The patient notes that approximate 2 weeks ago she started to have the sound of heartbeat in her right ear.  She notes this is not persistent through the entire day but does hear it every day.  She notes that it is more prominent after eating or when laying down at night and trying to sleep or in quiet environments.  She describes it as annoying, no significant pain.  She denies any previous history of the same with any previous pregnancies or when she was not pregnant.  She denies any associated neurologic deficits, she notes several months ago she had somewhat decreased visual acuity with no significant changes, no other visual changes or worsening changes.  She notes a history of migraines prior to this which has remained unchanged, no severe worsening headaches.  Her blood pressure has been under control with no episodes of hypertension.  She is anemic and currently taking iron .     Independent Review of Additional Tests or Records:  PCP office note on 09/04/2024   PMH/Meds/All/SocHx/FamHx/ROS:   Past Medical History:  Diagnosis Date   Anemia    Anxiety    Chlamydia 10/2011   Cholecystitis    Closed head injury with concussion    Gestational hypertension    Headache(784.0)    Heart palpitations    Hypertension    Right  medial tibial plateau fracture 11/24/2021     Past Surgical History:  Procedure Laterality Date   ORIF ANKLE FRACTURE Left 02/12/2016   Procedure: OPEN REDUCTION INTERNAL FIXATION (ORIF) ANKLE FRACTURE;  Surgeon: Ozell Bruch, MD;  Location: [facility] OR;  Service: Orthopedics;  Laterality: Left;   ORIF TIBIA PLATEAU Right 11/26/2021   Procedure: OPEN REDUCTION INTERNAL FIXATION (ORIF) TIBIAL PLATEAU;  Surgeon: Kendal Franky SQUIBB, MD;  Location: [facility] OR;  Service: Orthopedics;  Laterality: Right;   TONSILLECTOMY Bilateral 11/20/2023   Procedure: TONSILLECTOMY;  Surgeon: Jesus Oliphant, MD;  Location: [facility];  Service: ENT;  Laterality: Bilateral;    Family History  Problem Relation Age of Onset   Diabetes Mother    Hypertension Mother    Migraines Father    Diabetes Maternal Grandmother    Hypertension Maternal Grandmother    Cancer Maternal Grandmother    Anesthesia problems Neg Hx    Other Neg Hx      Social Connections: Not on file     Current Medications[1]   Physical Exam:   BP 133/78   Pulse 99   Ht 5' 6 (1.676 m)   Wt 265 lb (120.2 kg)   LMP  (LMP Unknown)   SpO2 98%   BMI 42.77 kg/m   Pertinent Findings  CN II-XII intact, pupils equal round and reactive to light, vision normal Bilateral EAC clear and TM intact with well pneumatized middle ear spaces  Anterior rhinoscopy: Septum midline; bilateral inferior turbinates with minimal hypertrophy No lesions of oral cavity/oropharynx; dentition in normal limits No obviously palpable neck masses/lymphadenopathy/thyromegaly, no bruits on exam, no thrills, no auditory tinnitus, light pressure along the right internal jugular resolved the pulsatile tinnitus No respiratory distress or stridor   Seprately Identifiable Procedures:  None  Impression & Plans:  [name] [name] is a 33 y.o. female with the following   Assessment and Plan    Pulsatile tinnitus-  33 year old female who is [redacted] weeks pregnant presents today  with right sided pulsatile tinnitus.  This is not persistent throughout the entire day, it is worse in quiet environments, when laying down, and after eating.  She has no red flags on exam.  She does describe several months of some slightly decreased vision, no double vision no dramatic changes to her vision, no associated headaches, no neurologic deficits.  No hypertension.  She is currently anemic and being treated with iron .  I am able to resolve the tinnitus with gentle pressure along the right internal jugular.  I feel no thrills or bruits.  Given the lack of red flags, her overall presentation and physical exam this is likely a venous hum, I have low suspicion this is arterial in nature or secondary to tumor; no findings of increased intracranial pressure.  Pregnancy-induced changes in including increased blood volume as well as anemia causing decreased viscosity can lead to the pulsatile tinnitus within the venous system.  I had a lengthy discussion with the patient about pulsatile tinnitus although ruling out any life-threatening etiology would require further evaluation I do not feel that pursuing with imaging studies would be in her best interest, given the lack of concerning findings on history or exam today.  This will likely resolve after delivery, if it persists she will follow-up in our office.  If she develops any new or worsening signs or symptoms which I discussed in detail with her she will seek immediate medical care, she will reach out to my office if she has any further questions or concerns.          - f/u PRN   Thank you for allowing me the opportunity to care for your patient. Please do not hesitate to contact me should you have any other questions.  Sincerely, Chyrl Cohen PA-C [facility] Phone: [phone] Fax: [phone]  09/13/2024, 9:20 AM        [1]  Current Outpatient Medications:    aspirin  EC 81 MG tablet, Take 1 tablet (81 mg total) by  mouth daily. Swallow whole. Start at 12 weeks and continue rest of pregnancy (Patient not taking: Reported on 07/17/2024), Disp: 90 tablet, Rfl: 2   Blood Pressure Monitoring (BLOOD PRESSURE KIT) DEVI, 1 Device by Does not apply route once a week. (Patient not taking: Reported on 07/17/2024), Disp: 1 each, Rfl: 0   Doxylamine -Pyridoxine  (DICLEGIS ) 10-10 MG TBEC, Take 2 tablets by mouth at bedtime. If symptoms persist, add one tablet in the morning and one in the afternoon (Patient not taking: Reported on 07/17/2024), Disp: 100 tablet, Rfl: 5   ferrous sulfate  325 (65 FE) MG tablet, Take 1 tablet (325 mg total) by mouth every other day., Disp: 30 tablet, Rfl: 3   fluticasone  (FLONASE ) 50 MCG/ACT nasal spray, Place 1 spray into both nostrils in the morning and at bedtime. (Patient not taking: Reported on 07/17/2024), Disp: 11.1 mL, Rfl: 2   metoCLOPramide  (REGLAN ) 10 MG tablet, Take 1 tablet (10 mg total)  by mouth every 6 (six) hours. (Patient not taking: Reported on 07/17/2024), Disp: 90 tablet, Rfl: 1   NIFEdipine  (PROCARDIA -XL/NIFEDICAL-XL) 30 MG 24 hr tablet, Take 1 tablet (30 mg total) by mouth daily., Disp: 30 tablet, Rfl: 3   Prenatal Vit-Fe Fumarate-FA (PRENATAL COMPLETE) 14-0.4 MG TABS, Take 1 tablet by mouth daily., Disp: 60 tablet, Rfl: 3   promethazine  (PHENERGAN ) 25 MG tablet, Take 1 tablet (25 mg total) by mouth every 6 (six) hours as needed for nausea or vomiting. (Patient not taking: Reported on 07/17/2024), Disp: 30 tablet, Rfl: 1   scopolamine  (TRANSDERM-SCOP) 1 MG/3DAYS, Place 1 patch (1.5 mg total) onto the skin every 3 (three) days. (Patient not taking: Reported on 07/17/2024), Disp: 10 patch, Rfl: 12

## 2024-09-18 ENCOUNTER — Encounter: Payer: Self-pay | Admitting: Obstetrics

## 2024-09-18 ENCOUNTER — Ambulatory Visit: Admitting: Obstetrics

## 2024-09-18 VITALS — BP 134/77 | HR 98 | Wt 266.0 lb

## 2024-09-18 DIAGNOSIS — O09899 Supervision of other high risk pregnancies, unspecified trimester: Secondary | ICD-10-CM | POA: Diagnosis not present

## 2024-09-18 DIAGNOSIS — R8271 Bacteriuria: Secondary | ICD-10-CM | POA: Diagnosis not present

## 2024-09-18 DIAGNOSIS — O99019 Anemia complicating pregnancy, unspecified trimester: Secondary | ICD-10-CM | POA: Diagnosis not present

## 2024-09-18 DIAGNOSIS — O0990 Supervision of high risk pregnancy, unspecified, unspecified trimester: Secondary | ICD-10-CM

## 2024-09-18 DIAGNOSIS — I10 Essential (primary) hypertension: Secondary | ICD-10-CM | POA: Diagnosis not present

## 2024-09-18 DIAGNOSIS — O09299 Supervision of pregnancy with other poor reproductive or obstetric history, unspecified trimester: Secondary | ICD-10-CM

## 2024-09-18 DIAGNOSIS — D563 Thalassemia minor: Secondary | ICD-10-CM | POA: Diagnosis not present

## 2024-09-18 DIAGNOSIS — O99210 Obesity complicating pregnancy, unspecified trimester: Secondary | ICD-10-CM | POA: Diagnosis not present

## 2024-09-18 MED ORDER — ACCRUFER 30 MG PO CAPS: 1.0000 | ORAL_CAPSULE | Freq: Two times a day (BID) | ORAL | 3 refills | Status: DC

## 2024-09-18 NOTE — Progress Notes (Signed)
 Subjective:  [name] [name] is a 33 y.o. H3E6885 at [redacted]w[redacted]d being seen today for ongoing prenatal care.  She is currently monitored for the following issues for this high-risk pregnancy and has Murmur, cardiac; Anemia affecting pregnancy; Hx of preeclampsia, prior pregnancy, currently pregnant; Peritonsillar abscess; Supervision of high risk pregnancy, antepartum; GBS bacteriuria; History of preterm delivery, currently pregnant; Thalassemia alpha carrier; Obesity affecting pregnancy, antepartum; and Chronic hypertension on their problem list.  Patient reports heartburn.  Contractions: Irritability. Vag. Bleeding: None.  Movement: Present. Denies leaking of fluid.   The following portions of the patient's history were reviewed and updated as appropriate: allergies, current medications, past family history, past medical history, past social history, past surgical history and problem list. Problem list updated.  Objective:   Vitals:   09/18/24 1407 09/18/24 1419  BP: (!) 152/84 134/77  Pulse: 98 98  Weight: 266 lb (120.7 kg)     Fetal Status: Fetal Heart Rate (bpm): 146   Movement: Present     General:  Alert, oriented and cooperative. Patient is in no acute distress.  Skin: Skin is warm and dry. No rash noted.   Cardiovascular: Normal heart rate noted  Respiratory: Normal respiratory effort, no problems with respiration noted  Abdomen: Soft, gravid, appropriate for gestational age. Pain/Pressure: Present     Pelvic:  Cervical exam deferred        Extremities: Normal range of motion.  Edema: Trace  Mental Status: Normal mood and affect. Normal behavior. Normal judgment and thought content.   Urinalysis:      Assessment and Plan:  Pregnancy: H3E6885 at [redacted]w[redacted]d  1. Supervision of high risk pregnancy, antepartum (Primary)  2. Hx of preeclampsia, prior pregnancy, currently pregnant - taking Baby ASA  3. History of preterm delivery, currently pregnant  4. Chronic hypertension - BP  clinically stable on Procardia   5. Anemia affecting pregnancy, antepartum - Taking iron .  D/C FeSO4 - Accrufer  Rx  6. GBS bacteriuria, treated - treat in labor  7. Thalassemia alpha carrier  8. Obesity affecting pregnancy, antepartum, unspecified obesity type    Preterm labor symptoms and general obstetric precautions including but not limited to vaginal bleeding, contractions, leaking of fluid and fetal movement were reviewed in detail with the patient. Please refer to After Visit Summary for other counseling recommendations.   Return in about 2 weeks (around 10/02/2024) for [facility].   Rudy Carlin LABOR, MD 09/18/2024

## 2024-09-18 NOTE — Progress Notes (Signed)
 HOB  Pt reports chest pain that is sharp and quick once in awhile. She states she reported it at last weeks visit also. She reports fatigue.   Pt on 30mg  daily nefedipine Today's BP in office are 152/84 and 134/77

## 2024-09-24 ENCOUNTER — Ambulatory Visit (HOSPITAL_BASED_OUTPATIENT_CLINIC_OR_DEPARTMENT_OTHER): Admitting: Obstetrics and Gynecology

## 2024-09-24 ENCOUNTER — Ambulatory Visit: Attending: Obstetrics and Gynecology

## 2024-09-24 VITALS — BP 159/77 | HR 101

## 2024-09-24 DIAGNOSIS — O0990 Supervision of high risk pregnancy, unspecified, unspecified trimester: Secondary | ICD-10-CM | POA: Insufficient documentation

## 2024-09-24 DIAGNOSIS — O09299 Supervision of pregnancy with other poor reproductive or obstetric history, unspecified trimester: Secondary | ICD-10-CM | POA: Insufficient documentation

## 2024-09-24 DIAGNOSIS — O358XX0 Maternal care for other (suspected) fetal abnormality and damage, not applicable or unspecified: Secondary | ICD-10-CM | POA: Insufficient documentation

## 2024-09-24 DIAGNOSIS — D563 Thalassemia minor: Secondary | ICD-10-CM

## 2024-09-24 DIAGNOSIS — Z3A34 34 weeks gestation of pregnancy: Secondary | ICD-10-CM

## 2024-09-24 DIAGNOSIS — E669 Obesity, unspecified: Secondary | ICD-10-CM

## 2024-09-24 DIAGNOSIS — O99212 Obesity complicating pregnancy, second trimester: Secondary | ICD-10-CM | POA: Insufficient documentation

## 2024-09-24 DIAGNOSIS — O10013 Pre-existing essential hypertension complicating pregnancy, third trimester: Secondary | ICD-10-CM | POA: Diagnosis present

## 2024-09-24 DIAGNOSIS — O09293 Supervision of pregnancy with other poor reproductive or obstetric history, third trimester: Secondary | ICD-10-CM | POA: Diagnosis not present

## 2024-09-24 DIAGNOSIS — O99013 Anemia complicating pregnancy, third trimester: Secondary | ICD-10-CM

## 2024-09-24 DIAGNOSIS — O99213 Obesity complicating pregnancy, third trimester: Secondary | ICD-10-CM

## 2024-09-24 NOTE — Progress Notes (Signed)
 Maternal-Fetal Medicine Consultation  Name: [name] [name]  MRN: [id number]  GA: H3E6885 [redacted]w[redacted]d   Patient is here for fetal growth assessment.  She has chronic hypertension and takes nifedipine  XL 30 mg daily.  Blood pressures today at our office were 159/77 and repeat 126/82 mmHg.  She does not have symptoms of severe features of preeclampsia.  - Pregravid BMI 41. She does not have gestational diabetes.  Ultrasound Normal fetal growth and amniotic fluid.  Transverse lie and head to maternal right.  Antenatal testing is reassuring.  BPP 8/8.  I reassured the patient of the findings.  We discussed timing of delivery.  And well-controlled hypertension, delivery should be considered at [redacted] weeks gestation.  There is a 50% likelihood of developing preeclampsia in patients with chronic hypertension after [redacted] weeks gestation.  I discussed the importance of weekly antenatal testing till delivery.  Recommendations Continue weekly antenatal testing till delivery. -Delivery at 38-weeks' gestation.     Consultation including face-to-face (more than 50%) counseling 20 minutes.

## 2024-10-02 ENCOUNTER — Ambulatory Visit: Admitting: *Deleted

## 2024-10-02 ENCOUNTER — Other Ambulatory Visit: Payer: Self-pay | Admitting: *Deleted

## 2024-10-02 VITALS — BP 132/70 | HR 101

## 2024-10-02 DIAGNOSIS — I10 Essential (primary) hypertension: Secondary | ICD-10-CM

## 2024-10-02 DIAGNOSIS — O0990 Supervision of high risk pregnancy, unspecified, unspecified trimester: Secondary | ICD-10-CM

## 2024-10-02 DIAGNOSIS — Z3A35 35 weeks gestation of pregnancy: Secondary | ICD-10-CM

## 2024-10-02 DIAGNOSIS — O09299 Supervision of pregnancy with other poor reproductive or obstetric history, unspecified trimester: Secondary | ICD-10-CM

## 2024-10-02 DIAGNOSIS — O09899 Supervision of other high risk pregnancies, unspecified trimester: Secondary | ICD-10-CM

## 2024-10-02 DIAGNOSIS — O99213 Obesity complicating pregnancy, third trimester: Secondary | ICD-10-CM | POA: Insufficient documentation

## 2024-10-02 DIAGNOSIS — O10913 Unspecified pre-existing hypertension complicating pregnancy, third trimester: Secondary | ICD-10-CM | POA: Diagnosis not present

## 2024-10-02 DIAGNOSIS — O99210 Obesity complicating pregnancy, unspecified trimester: Secondary | ICD-10-CM

## 2024-10-02 NOTE — Procedures (Signed)
 [name] [name] 10-27-1990 [redacted]w[redacted]d  Fetus A Non-Stress Test Interpretation for 10/02/2024  Indication: Chronic Hypertension and preg obese  Fetal Heart Rate A Mode: External Baseline Rate (A): 140 bpm Variability: Moderate Accelerations: 15 x 15 Decelerations: None Multiple birth?: No  Uterine Activity Mode: Toco Contraction Frequency (min): none Resting Tone Palpated: Relaxed  Interpretation (Fetal Testing) Nonstress Test Interpretation: Reactive Comments: Tracing reviewed byDr. Ileana

## 2024-10-07 ENCOUNTER — Other Ambulatory Visit (HOSPITAL_COMMUNITY)
Admission: RE | Admit: 2024-10-07 | Discharge: 2024-10-07 | Disposition: A | Discharge status: Home / Self Care | Source: Ambulatory Visit | Attending: Obstetrics and Gynecology | Admitting: Obstetrics and Gynecology

## 2024-10-07 ENCOUNTER — Ambulatory Visit: Payer: Self-pay | Admitting: Obstetrics and Gynecology

## 2024-10-07 ENCOUNTER — Encounter: Payer: Self-pay | Admitting: Obstetrics and Gynecology

## 2024-10-07 VITALS — BP 132/81 | HR 94 | Wt 271.0 lb

## 2024-10-07 DIAGNOSIS — O09893 Supervision of other high risk pregnancies, third trimester: Secondary | ICD-10-CM

## 2024-10-07 DIAGNOSIS — Z6841 Body Mass Index (BMI) 40.0 and over, adult: Secondary | ICD-10-CM | POA: Insufficient documentation

## 2024-10-07 DIAGNOSIS — R8271 Bacteriuria: Secondary | ICD-10-CM

## 2024-10-07 DIAGNOSIS — O0993 Supervision of high risk pregnancy, unspecified, third trimester: Secondary | ICD-10-CM

## 2024-10-07 DIAGNOSIS — R011 Cardiac murmur, unspecified: Secondary | ICD-10-CM | POA: Diagnosis not present

## 2024-10-07 DIAGNOSIS — O0990 Supervision of high risk pregnancy, unspecified, unspecified trimester: Secondary | ICD-10-CM | POA: Diagnosis present

## 2024-10-07 DIAGNOSIS — O09899 Supervision of other high risk pregnancies, unspecified trimester: Secondary | ICD-10-CM

## 2024-10-07 DIAGNOSIS — D563 Thalassemia minor: Secondary | ICD-10-CM | POA: Diagnosis not present

## 2024-10-07 DIAGNOSIS — O09293 Supervision of pregnancy with other poor reproductive or obstetric history, third trimester: Secondary | ICD-10-CM | POA: Diagnosis not present

## 2024-10-07 DIAGNOSIS — I10 Essential (primary) hypertension: Secondary | ICD-10-CM

## 2024-10-07 DIAGNOSIS — Z3A36 36 weeks gestation of pregnancy: Secondary | ICD-10-CM | POA: Diagnosis not present

## 2024-10-07 DIAGNOSIS — O09299 Supervision of pregnancy with other poor reproductive or obstetric history, unspecified trimester: Secondary | ICD-10-CM

## 2024-10-07 NOTE — Progress Notes (Signed)
 GBS in urine.  Pt states MFM talked to her about induction due to history of preE and CHTN. She would like to discuss that with provider today.   Pt delivered her son at 36 weeks due to preE.  Pt reports not sleeping well and has currently been up for 24 hours.

## 2024-10-07 NOTE — Progress Notes (Signed)
" ° °  PRENATAL VISIT NOTE  Subjective:  [name] [name] is a 34 y.o. H3E6885 at [redacted]w[redacted]d being seen today for ongoing prenatal care.  She is currently monitored for the following issues for this high-risk pregnancy and has Murmur, cardiac; Anemia affecting pregnancy; Hx of preeclampsia, prior pregnancy, currently pregnant; Peritonsillar abscess; Supervision of high risk pregnancy, antepartum; GBS bacteriuria; History of preterm delivery, currently pregnant; Thalassemia alpha carrier; Obesity affecting pregnancy, antepartum; Chronic hypertension; and BMI 40.0-44.9, adult (HCC) on their problem list.  Patient doing well with no acute concerns today. She reports no complaints.  Contractions: Irritability. Vag. Bleeding: None.  Movement: Present. Denies leaking of fluid.   The following portions of the patient's history were reviewed and updated as appropriate: allergies, current medications, past family history, past medical history, past social history, past surgical history and problem list. Problem list updated.  Objective:   Vitals:   10/07/24 1051  BP: 132/81  Pulse: 94  Weight: 271 lb (122.9 kg)    Fetal Status: Fetal Heart Rate (bpm): 139 Fundal Height: 36 cm Movement: Present     General:  Alert, oriented and cooperative. Patient is in no acute distress.  Skin: Skin is warm and dry. No rash noted.   Cardiovascular: Normal heart rate noted  Respiratory: Normal respiratory effort, no problems with respiration noted  Abdomen: Soft, gravid, appropriate for gestational age.  Pain/Pressure: Present     Pelvic: Cervical exam performed Dilation: Fingertip Effacement (%): 40 Station: Ballotable  Extremities: Normal range of motion.  Edema: Trace  Mental Status:  Normal mood and affect. Normal behavior. Normal judgment and thought content.   Assessment and Plan:  Pregnancy: H3E6885 at [redacted]w[redacted]d  1. [redacted] weeks gestation of pregnancy (Primary)   2. Chronic hypertension BP well controlled on current  meds, pt not taking baby ASA Delivery at 38 weeks per MFM  3. Thalassemia alpha carrier   4. Supervision of high risk pregnancy, antepartum Continue routine prenatal care - Cervicovaginal ancillary only( [place])  5. Murmur, cardiac   6. Hx of preeclampsia, prior pregnancy, currently pregnant No s/sx of preeclampsia  7. History of preterm delivery, currently pregnant No s/sx of preterm labor  8. GBS bacteriuria Treat in labor  9. BMI 40.0-44.9, adult (HCC)   Preterm labor symptoms and general obstetric precautions including but not limited to vaginal bleeding, contractions, leaking of fluid and fetal movement were reviewed in detail with the patient.  Please refer to After Visit Summary for other counseling recommendations.   Return in about 1 week (around 10/14/2024) for [facility], in person.   Jerilynn Buddle, MD Faculty Attending Center for [facility]   "

## 2024-10-08 ENCOUNTER — Ambulatory Visit (HOSPITAL_BASED_OUTPATIENT_CLINIC_OR_DEPARTMENT_OTHER): Admitting: Obstetrics

## 2024-10-08 ENCOUNTER — Ambulatory Visit: Attending: Obstetrics and Gynecology

## 2024-10-08 VITALS — BP 131/61

## 2024-10-08 DIAGNOSIS — O358XX0 Maternal care for other (suspected) fetal abnormality and damage, not applicable or unspecified: Secondary | ICD-10-CM | POA: Insufficient documentation

## 2024-10-08 DIAGNOSIS — Z3A36 36 weeks gestation of pregnancy: Secondary | ICD-10-CM

## 2024-10-08 DIAGNOSIS — O0990 Supervision of high risk pregnancy, unspecified, unspecified trimester: Secondary | ICD-10-CM

## 2024-10-08 DIAGNOSIS — E669 Obesity, unspecified: Secondary | ICD-10-CM

## 2024-10-08 DIAGNOSIS — O10913 Unspecified pre-existing hypertension complicating pregnancy, third trimester: Secondary | ICD-10-CM | POA: Diagnosis present

## 2024-10-08 DIAGNOSIS — O09299 Supervision of pregnancy with other poor reproductive or obstetric history, unspecified trimester: Secondary | ICD-10-CM

## 2024-10-08 DIAGNOSIS — O10013 Pre-existing essential hypertension complicating pregnancy, third trimester: Secondary | ICD-10-CM | POA: Diagnosis not present

## 2024-10-08 DIAGNOSIS — D563 Thalassemia minor: Secondary | ICD-10-CM | POA: Diagnosis not present

## 2024-10-08 DIAGNOSIS — O99213 Obesity complicating pregnancy, third trimester: Secondary | ICD-10-CM | POA: Insufficient documentation

## 2024-10-08 DIAGNOSIS — O99212 Obesity complicating pregnancy, second trimester: Secondary | ICD-10-CM | POA: Insufficient documentation

## 2024-10-08 DIAGNOSIS — O99013 Anemia complicating pregnancy, third trimester: Secondary | ICD-10-CM | POA: Diagnosis not present

## 2024-10-08 LAB — CERVICOVAGINAL ANCILLARY ONLY
Bacterial Vaginitis (gardnerella): NEGATIVE
Candida Glabrata: NEGATIVE
Candida Vaginitis: NEGATIVE
Chlamydia: NEGATIVE
Comment: NEGATIVE
Comment: NEGATIVE
Comment: NEGATIVE
Comment: NEGATIVE
Comment: NEGATIVE
Comment: NORMAL
Neisseria Gonorrhea: NEGATIVE
Trichomonas: NEGATIVE

## 2024-10-08 NOTE — Progress Notes (Signed)
 MFM Consult Note  [name] [name] is currently at [redacted]w[redacted]d. She has been followed due to maternal obesity with a BMI of 41.5 and chronic hypertension treated with nifedipine .    She denies any problems since her last exam.  Her blood pressure today was 131/61.[name]  Sonographic findings Single intrauterine pregnancy at 36w 1d. Fetal cardiac activity: Observed. Presentation: Cephalic. Amniotic fluid: Within normal limits. AFI: 14.11 cm.  MVP: 6.89 cm. Placenta: Anterior. BPP: 8/8.   Due to chronic hypertension and maternal obesity, we will continue to follow her with weekly fetal testing until delivery.    She already has an induction of labor scheduled on October 22, 2024.    She will return in 1 week for an NST.    Preeclampsia precautions were reviewed today.  The patient stated that all of her questions were answered.   A total of 20 minutes was spent counseling and coordinating the care for this patient.  Greater than 50% of the time was spent in direct face-to-face contact.

## 2024-10-11 ENCOUNTER — Ambulatory Visit: Payer: Self-pay | Admitting: Obstetrics and Gynecology

## 2024-10-15 ENCOUNTER — Inpatient Hospital Stay (HOSPITAL_COMMUNITY)
Admission: AD | Admit: 2024-10-15 | Discharge: 2024-10-15 | Disposition: A | Discharge status: Home / Self Care | Attending: Obstetrics and Gynecology | Admitting: Obstetrics and Gynecology

## 2024-10-15 ENCOUNTER — Encounter (HOSPITAL_COMMUNITY): Payer: Self-pay | Admitting: Obstetrics and Gynecology

## 2024-10-15 ENCOUNTER — Encounter: Payer: Self-pay | Admitting: Obstetrics and Gynecology

## 2024-10-15 ENCOUNTER — Telehealth: Payer: Self-pay

## 2024-10-15 ENCOUNTER — Telehealth: Payer: Self-pay | Admitting: Obstetrics and Gynecology

## 2024-10-15 DIAGNOSIS — O0990 Supervision of high risk pregnancy, unspecified, unspecified trimester: Secondary | ICD-10-CM

## 2024-10-15 DIAGNOSIS — Z3A37 37 weeks gestation of pregnancy: Secondary | ICD-10-CM

## 2024-10-15 DIAGNOSIS — U071 COVID-19: Secondary | ICD-10-CM

## 2024-10-15 DIAGNOSIS — R519 Headache, unspecified: Secondary | ICD-10-CM | POA: Diagnosis not present

## 2024-10-15 DIAGNOSIS — O163 Unspecified maternal hypertension, third trimester: Secondary | ICD-10-CM | POA: Diagnosis not present

## 2024-10-15 DIAGNOSIS — O09293 Supervision of pregnancy with other poor reproductive or obstetric history, third trimester: Secondary | ICD-10-CM | POA: Diagnosis not present

## 2024-10-15 DIAGNOSIS — O99210 Obesity complicating pregnancy, unspecified trimester: Secondary | ICD-10-CM

## 2024-10-15 DIAGNOSIS — O10913 Unspecified pre-existing hypertension complicating pregnancy, third trimester: Secondary | ICD-10-CM | POA: Insufficient documentation

## 2024-10-15 DIAGNOSIS — O99013 Anemia complicating pregnancy, third trimester: Secondary | ICD-10-CM | POA: Diagnosis not present

## 2024-10-15 DIAGNOSIS — O99213 Obesity complicating pregnancy, third trimester: Secondary | ICD-10-CM

## 2024-10-15 DIAGNOSIS — O99820 Streptococcus B carrier state complicating pregnancy: Secondary | ICD-10-CM

## 2024-10-15 DIAGNOSIS — D563 Thalassemia minor: Secondary | ICD-10-CM | POA: Diagnosis not present

## 2024-10-15 DIAGNOSIS — O98513 Other viral diseases complicating pregnancy, third trimester: Secondary | ICD-10-CM

## 2024-10-15 DIAGNOSIS — Z6841 Body Mass Index (BMI) 40.0 and over, adult: Secondary | ICD-10-CM

## 2024-10-15 DIAGNOSIS — O26893 Other specified pregnancy related conditions, third trimester: Secondary | ICD-10-CM | POA: Insufficient documentation

## 2024-10-15 DIAGNOSIS — Z79899 Other long term (current) drug therapy: Secondary | ICD-10-CM | POA: Insufficient documentation

## 2024-10-15 DIAGNOSIS — E669 Obesity, unspecified: Secondary | ICD-10-CM

## 2024-10-15 DIAGNOSIS — I10 Essential (primary) hypertension: Secondary | ICD-10-CM

## 2024-10-15 DIAGNOSIS — R8271 Bacteriuria: Secondary | ICD-10-CM

## 2024-10-15 DIAGNOSIS — O09899 Supervision of other high risk pregnancies, unspecified trimester: Secondary | ICD-10-CM

## 2024-10-15 DIAGNOSIS — O09299 Supervision of pregnancy with other poor reproductive or obstetric history, unspecified trimester: Secondary | ICD-10-CM

## 2024-10-15 LAB — COMPREHENSIVE METABOLIC PANEL WITH GFR
ALT: 11 U/L (ref 0–44)
AST: 20 U/L (ref 15–41)
Albumin: 3.1 g/dL — ABNORMAL LOW (ref 3.5–5.0)
Alkaline Phosphatase: 107 U/L (ref 38–126)
Anion gap: 10 (ref 5–15)
BUN: <5 mg/dL — ABNORMAL LOW (ref 6–20)
CO2: 19 mmol/L — ABNORMAL LOW (ref 22–32)
Calcium: 8.3 mg/dL — ABNORMAL LOW (ref 8.9–10.3)
Chloride: 105 mmol/L (ref 98–111)
Creatinine, Ser: 0.45 mg/dL (ref 0.44–1.00)
GFR, Estimated: >60 mL/min
Glucose, Bld: 85 mg/dL (ref 70–99)
Potassium: 3.8 mmol/L (ref 3.5–5.1)
Sodium: 134 mmol/L — ABNORMAL LOW (ref 135–145)
Total Bilirubin: <0.2 mg/dL (ref 0.0–1.2)
Total Protein: 6.6 g/dL (ref 6.5–8.1)

## 2024-10-15 LAB — CBC
HCT: 29.8 % — ABNORMAL LOW (ref 36.0–46.0)
Hemoglobin: 9.5 g/dL — ABNORMAL LOW (ref 12.0–15.0)
MCH: 22.3 pg — ABNORMAL LOW (ref 26.0–34.0)
MCHC: 31.9 g/dL (ref 30.0–36.0)
MCV: 70 fL — ABNORMAL LOW (ref 80.0–100.0)
Platelets: 314 K/uL (ref 150–400)
RBC: 4.26 MIL/uL (ref 3.87–5.11)
RDW: 14.9 % (ref 11.5–15.5)
WBC: 8.9 K/uL (ref 4.0–10.5)
nRBC: 0 % (ref 0.0–0.2)

## 2024-10-15 LAB — URINALYSIS, ROUTINE W REFLEX MICROSCOPIC
Bilirubin Urine: NEGATIVE
Glucose, UA: NEGATIVE mg/dL
Hgb urine dipstick: NEGATIVE
Ketones, ur: NEGATIVE mg/dL
Leukocytes,Ua: NEGATIVE
Nitrite: NEGATIVE
Protein, ur: NEGATIVE mg/dL
Specific Gravity, Urine: 1.01 (ref 1.005–1.030)
pH: 7 (ref 5.0–8.0)

## 2024-10-15 LAB — RESP PANEL BY RT-PCR (RSV, FLU A&B, COVID)  RVPGX2
Influenza A by PCR: NEGATIVE
Influenza B by PCR: NEGATIVE
Resp Syncytial Virus by PCR: NEGATIVE
SARS Coronavirus 2 by RT PCR: POSITIVE — AB

## 2024-10-15 LAB — PROTEIN / CREATININE RATIO, URINE
Creatinine, Urine: 78 mg/dL
Protein Creatinine Ratio: 0.2 mg/mg — ABNORMAL HIGH
Total Protein, Urine: 18 mg/dL

## 2024-10-15 MED ORDER — LABETALOL HCL 5 MG/ML IV SOLN
20.0000 mg | INTRAVENOUS | Status: DC | PRN
  Administered 2024-10-15: 20 mg via INTRAVENOUS
  Filled 2024-10-15: qty 4

## 2024-10-15 MED ORDER — LABETALOL HCL 5 MG/ML IV SOLN
40.0000 mg | INTRAVENOUS | Status: DC | PRN
  Administered 2024-10-15: 40 mg via INTRAVENOUS
  Filled 2024-10-15: qty 8

## 2024-10-15 MED ORDER — NIFEDIPINE ER OSMOTIC RELEASE 30 MG PO TB24
30.0000 mg | ORAL_TABLET | Freq: Once | ORAL | Status: AC
  Administered 2024-10-15: 30 mg via ORAL
  Filled 2024-10-15: qty 1

## 2024-10-15 MED ORDER — ACETAMINOPHEN 500 MG PO TABS
1000.0000 mg | ORAL_TABLET | Freq: Once | ORAL | Status: AC
  Administered 2024-10-15: 1000 mg via ORAL
  Filled 2024-10-15: qty 2

## 2024-10-15 MED ORDER — CYCLOBENZAPRINE HCL 10 MG PO TABS
10.0000 mg | ORAL_TABLET | Freq: Once | ORAL | Status: AC
  Administered 2024-10-15: 10 mg via ORAL
  Filled 2024-10-15: qty 1

## 2024-10-15 MED ORDER — LABETALOL HCL 5 MG/ML IV SOLN: 80.0000 mg | INTRAVENOUS | Status: DC | PRN

## 2024-10-15 MED ORDER — PAXLOVID (300/100) 20 X 150 MG & 10 X 100MG PO TBPK: 3.0000 | ORAL_TABLET | Freq: Two times a day (BID) | ORAL | 0 refills | Status: AC

## 2024-10-15 MED ORDER — HYDRALAZINE HCL 20 MG/ML IJ SOLN: 10.0000 mg | INTRAMUSCULAR | Status: DC | PRN

## 2024-10-15 NOTE — Discharge Instructions (Signed)
 Safe Medications in Pregnancy   Acne: Benzoyl Peroxide Salicylic Acid  Backache/Headache: Tylenol: 2 regular strength every 4 hours OR              2 Extra strength every 6 hours  Colds/Coughs/Allergies: Benadryl (alcohol free) 25 mg every 6 hours as needed Breath right strips Claritin Cepacol throat lozenges Chloraseptic throat spray Cold-Eeze- up to three times per day Cough drops, alcohol free Flonase (by prescription only) Guaifenesin Mucinex Robitussin DM (plain only, alcohol free) Saline nasal spray/drops Sudafed (pseudoephedrine) & Actifed ** use only after [redacted] weeks gestation and if you do not have high blood pressure Tylenol Vicks Vaporub Zinc lozenges Zyrtec   Constipation: Colace Ducolax suppositories Fleet enema Glycerin suppositories Metamucil Milk of magnesia Miralax Senokot Smooth move tea  Diarrhea: Kaopectate Imodium A-D  *NO pepto Bismol  Hemorrhoids: Anusol Anusol HC Preparation H Tucks  Indigestion: Tums Maalox Mylanta Zantac  Pepcid  Insomnia: Benadryl (alcohol free) 25mg  every 6 hours as needed Tylenol PM Unisom, no Gelcaps  Leg Cramps: Tums MagGel  Nausea/Vomiting:  Bonine Dramamine Emetrol Ginger extract Sea bands Meclizine  Nausea medication to take during pregnancy:  Unisom (doxylamine succinate 25 mg tablets) Take one tablet daily at bedtime. If symptoms are not adequately controlled, the dose can be increased to a maximum recommended dose of two tablets daily (1/2 tablet in the morning, 1/2 tablet mid-afternoon and one at bedtime). Vitamin B6 100mg  tablets. Take one tablet twice a day (up to 200 mg per day).  Skin Rashes: Aveeno products Benadryl cream or 25mg  every 6 hours as needed Calamine Lotion 1% cortisone cream  Yeast infection: Gyne-lotrimin 7 Monistat 7  Gum/tooth pain: Anbesol  **If taking multiple medications, please check labels to avoid duplicating the same active ingredients **take  medication as directed on the label ** Do not exceed 4000 mg of tylenol in 24 hours **Do not take medications that contain aspirin or ibuprofen

## 2024-10-15 NOTE — Progress Notes (Signed)
 Pt was at MAU this morning; Presumptive Covid.   Pt would like to discuss induction scheduled next Tuesday 10/22/24.

## 2024-10-15 NOTE — MAU Note (Signed)
 [name] [name] is a 34 y.o. at [redacted]w[redacted]d here in MAU reporting: I am sick, reports fever, body aches, nasal congestion, cough, chest hurts. Also reports her b/p's today have been 150's/90's. Is having a lot of contractions and pressure. Denies bleeding or ROM. Reports positive fetal movement.   Onset of complaint: yesterday Pain score: 7/10 contractions and pressure                     9/10 body aches Vitals:   10/15/24 0843  BP: (!) 154/74  Pulse: (!) 124  Resp: 19  Temp: 99.5 F (37.5 C)  SpO2: 99%     FHT: 150  Lab orders placed from triage: ua

## 2024-10-15 NOTE — Progress Notes (Signed)
" ° ° °  TELEHEALTH OBSTETRICS VISIT ENCOUNTER NOTE  Provider location: Center for [facility] at [facility]   Patient location: Home  I connected with [name] [name] on 10/15/2024 at  1:50 PM EST by telephone at home and verified that I am speaking with the correct person using two identifiers. Of note, unable to do video encounter due to technical difficulties.    I discussed the limitations, risks, security and privacy concerns of performing an evaluation and management service by telephone and the availability of in person appointments. I also discussed with the patient that there may be a patient responsible charge related to this service. The patient expressed understanding and agreed to proceed.  Subjective:  [name] [name] is a 34 y.o. H3E6885 at [redacted]w[redacted]d being followed for ongoing prenatal care.  She is currently monitored for the following issues for this high-risk pregnancy and has Murmur, cardiac; Anemia affecting pregnancy; Hx of preeclampsia, prior pregnancy, currently pregnant; Peritonsillar abscess; Supervision of high risk pregnancy, antepartum; GBS bacteriuria; History of preterm delivery, currently pregnant; Thalassemia alpha carrier; Obesity affecting pregnancy, antepartum; Chronic hypertension; and BMI 40.0-44.9, adult (HCC) on their problem list.  Patient reports cough, cold, congestion. Reports fetal movement. Denies any contractions, bleeding or leaking of fluid.   The following portions of the patient's history were reviewed and updated as appropriate: allergies, current medications, past family history, past medical history, past social history, past surgical history and problem list.   Objective:  There were no vitals taken for this visit. General:  Alert, oriented and cooperative.   Mental Status: Normal mood and affect perceived. Normal judgment and thought content.  Rest of physical exam deferred due to type of encounter  Assessment and Plan:  Pregnancy: H3E6885 at  [redacted]w[redacted]d  1. Chronic hypertension (Primary) - Cont procardia  -Has appt for induction 10/22/24 -had NST while in MAU today Has IOL for 38 weeks, per MFM will keep plan for IOL at that point  2. Anemia affecting pregnancy in third trimester  3. Hx of preeclampsia, prior pregnancy, currently pregnant  4. Supervision of high risk pregnancy, antepartum  5. GBS bacteriuria Ppx in labor  6. History of preterm delivery, currently pregnant  7. Thalassemia alpha carrier  8. Obesity affecting pregnancy, antepartum, unspecified obesity type  9. BMI 40.0-44.9, adult (HCC)  10. [redacted] weeks gestation of pregnancy  11. COVID Patient diagnosed today in MAU with COVID, started symptoms Sunday night -to start pxlovid -reviewed reasons to present to MAU - will keep IOL for 38 weeks per MFM  Term labor symptoms and general obstetric precautions including but not limited to vaginal bleeding, contractions, leaking of fluid and fetal movement were reviewed in detail with the patient.  I discussed the assessment and treatment plan with the patient. The patient was provided an opportunity to ask questions and all were answered. The patient agreed with the plan and demonstrated an understanding of the instructions. The patient was advised to call back or seek an in-person office evaluation/go to MAU at [facility] for any urgent or concerning symptoms. Please refer to After Visit Summary for other counseling recommendations.   I provided 15 minutes of non-face-to-face time during this encounter.  Return in about 1 week (around 10/22/2024) for IOL.  Future Appointments  Date Time Provider Department Center  10/22/2024  6:30 AM [facility] SCHED ROOM [facility] None    Burnard CHRISTELLA Moats, MD Center for [facility], [facility]    "

## 2024-10-15 NOTE — MAU Provider Note (Signed)
 " History     CSN: [id number]  Arrival date and time: 10/15/24 0825   Event Date/Time   First Provider Initiated Contact with Patient 10/15/24 [phone]      Chief Complaint  Patient presents with   Contractions   Hypertension   HPI [name] [name] is a 34 y.o. H3E6885 at [redacted]w[redacted]d who presents with body aches, headache, cough, hypertension, & contractions.  Has CHTN, on procardia . Normally takes procardia  in the morning but didn't take this morning b/c she felt bad & was coming here. Reports headache since yesterday that she relates to her current illness. Denies visual disturbance or epigastric pain.  Reports body aches, cough, & congestion since yesterday. Unsure if had fever. Also feels like she's having palpitations. Daughter is sick with similar symptoms.  Hasn't treated symptoms.  Reports contractions every 5-10 minutes.  Denies sore throat, cp, sob. Denies abdominal pain, n/v/d, dysuria, vaginal bleeding, or LOF. Reports good fetal movement.   OB History     Gravida  6   Para  4   Term  3   Preterm  1   AB  1   Living  4      SAB  0   IAB  1   Ectopic  0   Multiple  0   Live Births  4           Past Medical History:  Diagnosis Date   Anemia    Anxiety    Chlamydia 10/2011   Cholecystitis    Closed head injury with concussion    Gestational hypertension    Headache(784.0)    Heart palpitations    Hypertension    Right medial tibial plateau fracture 11/24/2021    Past Surgical History:  Procedure Laterality Date   ORIF ANKLE FRACTURE Left 02/12/2016   Procedure: OPEN REDUCTION INTERNAL FIXATION (ORIF) ANKLE FRACTURE;  Surgeon: Ozell Bruch, MD;  Location: [facility] OR;  Service: Orthopedics;  Laterality: Left;   ORIF TIBIA PLATEAU Right 11/26/2021   Procedure: OPEN REDUCTION INTERNAL FIXATION (ORIF) TIBIAL PLATEAU;  Surgeon: Kendal Franky SQUIBB, MD;  Location: [facility] OR;  Service: Orthopedics;  Laterality: Right;   TONSILLECTOMY Bilateral 11/20/2023   Procedure:  TONSILLECTOMY;  Surgeon: Jesus Oliphant, MD;  Location: [facility];  Service: ENT;  Laterality: Bilateral;    Family History  Problem Relation Age of Onset   Diabetes Mother    Hypertension Mother    Migraines Father    Diabetes Maternal Grandmother    Hypertension Maternal Grandmother    Cancer Maternal Grandmother    Anesthesia problems Neg Hx    Other Neg Hx     Social History[1]  Allergies: Allergies[2]  Medications Prior to Admission  Medication Sig Dispense Refill Last Dose/Taking   Ferric Maltol  (ACCRUFER ) 30 MG CAPS Take 1 capsule (30 mg total) by mouth 2 (two) times daily before a meal. Take 2 hrs before, or 2 hrs after a meal. 60 capsule 3 10/14/2024   NIFEdipine  (PROCARDIA -XL/NIFEDICAL-XL) 30 MG 24 hr tablet Take 1 tablet (30 mg total) by mouth daily. 30 tablet 3 10/14/2024   Prenatal Vit-Fe Fumarate-FA (PRENATAL COMPLETE) 14-0.4 MG TABS Take 1 tablet by mouth daily. 60 tablet 3 10/14/2024   aspirin  EC 81 MG tablet Take 1 tablet (81 mg total) by mouth daily. Swallow whole. Start at 12 weeks and continue rest of pregnancy (Patient not taking: Reported on 07/17/2024) 90 tablet 2    Blood Pressure Monitoring (BLOOD PRESSURE KIT) DEVI 1  Device by Does not apply route once a week. (Patient not taking: Reported on 07/17/2024) 1 each 0     Review of Systems  All other systems reviewed and are negative.  Physical Exam   Blood pressure (!) 162/90, pulse (!) 121, temperature 99.5 F (37.5 C), temperature source Oral, resp. rate 19, height 5' 6 (1.676 m), weight 123.3 kg, SpO2 100%.  Physical Exam Vitals and nursing note reviewed.  Constitutional:      General: She is not in acute distress.    Appearance: She is well-developed. She is ill-appearing.  HENT:     Head: Normocephalic and atraumatic.  Eyes:     General: No scleral icterus.       Right eye: No discharge.        Left eye: No discharge.     Conjunctiva/sclera: Conjunctivae normal.  Cardiovascular:      Rate and Rhythm: Regular rhythm. Tachycardia present.  Pulmonary:     Effort: Pulmonary effort is normal. No respiratory distress.     Breath sounds: Normal breath sounds. No wheezing.  Neurological:     General: No focal deficit present.     Mental Status: She is alert.  Psychiatric:        Mood and Affect: Mood normal.        Behavior: Behavior normal.    Dilation: Fingertip Effacement (%): Thick Cervical Position: Posterior Station: Ballotable Presentation: Undeterminable Exam by:: [email or web address],rn   NST:  Baseline: 140 bpm, Variability: Good {> 6 bpm), Accelerations: Reactive, and Decelerations: Absent  MAU Course  Procedures Results for orders placed or performed during the hospital encounter of 10/15/24 (from the past 24 hours)  Urinalysis, Routine w reflex microscopic -Urine, Clean Catch     Status: None   Collection Time: 10/15/24  8:55 AM  Result Value Ref Range   Color, Urine YELLOW YELLOW   APPearance CLEAR CLEAR   Specific Gravity, Urine 1.010 1.005 - 1.030   pH 7.0 5.0 - 8.0   Glucose, UA NEGATIVE NEGATIVE mg/dL   Hgb urine dipstick NEGATIVE NEGATIVE   Bilirubin Urine NEGATIVE NEGATIVE   Ketones, ur NEGATIVE NEGATIVE mg/dL   Protein, ur NEGATIVE NEGATIVE mg/dL   Nitrite NEGATIVE NEGATIVE   Leukocytes,Ua NEGATIVE NEGATIVE  Resp panel by RT-PCR (RSV, Flu A&B, Covid) Urine, Clean Catch     Status: Abnormal   Collection Time: 10/15/24  8:55 AM   Specimen: Urine, Clean Catch; Nasal Swab  Result Value Ref Range   SARS Coronavirus 2 by RT PCR POSITIVE (A) NEGATIVE   Influenza A by PCR NEGATIVE NEGATIVE   Influenza B by PCR NEGATIVE NEGATIVE   Resp Syncytial Virus by PCR NEGATIVE NEGATIVE  Protein / creatinine ratio, urine     Status: Abnormal   Collection Time: 10/15/24  8:55 AM  Result Value Ref Range   Creatinine, Urine 78 mg/dL   Total Protein, Urine 18 mg/dL   Protein Creatinine Ratio 0.2 (H) <0.2 mg/mg  CBC     Status: Abnormal   Collection Time:  10/15/24  9:41 AM  Result Value Ref Range   WBC 8.9 4.0 - 10.5 K/uL   RBC 4.26 3.87 - 5.11 MIL/uL   Hemoglobin 9.5 (L) 12.0 - 15.0 g/dL   HCT 70.1 (L) 63.9 - 53.9 %   MCV 70.0 (L) 80.0 - 100.0 fL   MCH 22.3 (L) 26.0 - 34.0 pg   MCHC 31.9 30.0 - 36.0 g/dL   RDW 85.0 88.4 - 84.4 %   Platelets  314 150 - 400 K/uL   nRBC 0.0 0.0 - 0.2 %  Comprehensive metabolic panel with GFR     Status: Abnormal   Collection Time: 10/15/24  9:41 AM  Result Value Ref Range   Sodium 134 (L) 135 - 145 mmol/L   Potassium 3.8 3.5 - 5.1 mmol/L   Chloride 105 98 - 111 mmol/L   CO2 19 (L) 22 - 32 mmol/L   Glucose, Bld 85 70 - 99 mg/dL   BUN <5 (L) 6 - 20 mg/dL   Creatinine, Ser 9.54 0.44 - 1.00 mg/dL   Calcium  8.3 (L) 8.9 - 10.3 mg/dL   Total Protein 6.6 6.5 - 8.1 g/dL   Albumin 3.1 (L) 3.5 - 5.0 g/dL   AST 20 15 - 41 U/L   ALT 11 0 - 44 U/L   Alkaline Phosphatase 107 38 - 126 U/L   Total Bilirubin <0.2 0.0 - 1.2 mg/dL   GFR, Estimated >39 >39 mL/min   Anion gap 10 5 - 15    MDM   Assessment and Plan   1. COVID-19 affecting pregnancy in third trimester  -Respiratory panel positive for COVID. Symptoms treated in MAU with tylenol  & flexeril . Patient reports marked improvement of symptoms.  -Rx paxlovid   2. Hypertension affecting pregnancy in third trimester  -Elevated BPs in MAU. SRBP x 3 tx with IV labetalol . Also given daily dose of procardia  -Headache resolved with tylenol  and likely secondary to viral illness -Otherwise asymptomatic & normal preeclampsia labs -Discussed with Dr. Cleatus. CHTN exacerbation related to missed antihypertensive & illness. Patient should keep appointments today & tomorrow. Provided masks to wear to the office.  -Reviewed preeclampsia precautions & reasons to return to MAU  3. [redacted] weeks gestation of pregnancy      Rocky Satterfield 10/15/2024, 9:11 AM       [1]  Social History Tobacco Use   Smoking status: Every Day    Current packs/day: 0.50    Average  packs/day: 0.5 packs/day for 10.0 years (5.0 ttl pk-yrs)    Types: Cigarettes   Smokeless tobacco: Never  Vaping Use   Vaping status: Never Used  Substance Use Topics   Alcohol use: No    Comment: socially   Drug use: No  [2]  Allergies Allergen Reactions   Metronidazole  Nausea And Vomiting    Can take if given Zofran  to take it with.   "

## 2024-10-15 NOTE — MAU Note (Signed)
 Provider aware of second severe range b/p

## 2024-10-16 ENCOUNTER — Telehealth (HOSPITAL_COMMUNITY): Payer: Self-pay | Admitting: *Deleted

## 2024-10-16 ENCOUNTER — Encounter (HOSPITAL_COMMUNITY): Payer: Self-pay | Admitting: *Deleted

## 2024-10-16 ENCOUNTER — Ambulatory Visit

## 2024-10-16 NOTE — Telephone Encounter (Signed)
 Preadmission screen

## 2024-10-20 ENCOUNTER — Ambulatory Visit (HOSPITAL_COMMUNITY)

## 2024-10-21 NOTE — H&P (Incomplete)
 [name] [name] is a 34 y.o. female [phone] with IUP at [redacted]w[redacted]d presenting for IOL for chr. PNCare at *** since *** wks  Prenatal History/Complications:     Past Medical History: Past Medical History:  Diagnosis Date   Anemia    Anxiety    Chlamydia 10/2011   Cholecystitis    Closed head injury with concussion    Gestational hypertension    Headache(784.0)    Heart palpitations    History of pre-eclampsia in prior pregnancy, currently pregnant    Hypertension    Right medial tibial plateau fracture 11/24/2021    Past Surgical History: Past Surgical History:  Procedure Laterality Date   ORIF ANKLE FRACTURE Left 02/12/2016   Procedure: OPEN REDUCTION INTERNAL FIXATION (ORIF) ANKLE FRACTURE;  Surgeon: Ozell Bruch, MD;  Location[facility] OR;  Service: Orthopedics;  Laterality: Left;   ORIF TIBIA PLATEAU Right 11/26/2021   Procedure: OPEN REDUCTION INTERNAL FIXATION (ORIF) TIBIAL PLATEAU;  Surgeon: Kendal Franky SQUIBB, MD;  Location: [facility] OR;  Service: Orthopedics;  Laterality: Right;   TONSILLECTOMY Bilateral 11/20/2023   Procedure: TONSILLECTOMY;  Surgeon: Jesus Oliphant, MD;  Location: [facility];  Service: ENT;  Laterality: Bilateral;    Obstetrical History: OB History     Gravida  6   Para  4   Term  3   Preterm  1   AB  1   Living  4      SAB  0   IAB  1   Ectopic  0   Multiple  0   Live Births  4           Social History: Social History   Socioeconomic History   Marital status: Single    Spouse name: Not on file   Number of children: Not on file   Years of education: Not on file   Highest education level: Not on file  Occupational History   Not on file  Tobacco Use   Smoking status: Every Day    Current packs/day: 0.50    Average packs/day: 0.5 packs/day for 10.0 years (5.0 ttl pk-yrs)    Types: Cigarettes   Smokeless tobacco: Never  Vaping Use   Vaping status: Never Used  Substance and Sexual Activity    Alcohol use: No    Comment: socially   Drug use: No   Sexual activity: Not Currently    Partners: Male    Birth control/protection: None  Other Topics Concern   Not on file  Social History Narrative   Not on file   Social Drivers of Health   Tobacco Use: High Risk (10/16/2024)   Patient History    Smoking Tobacco Use: Every Day    Smokeless Tobacco Use: Never    Passive Exposure: Not on file  Financial Resource Strain: Not on file  Food Insecurity: Not on file  Transportation Needs: Not on file  Physical Activity: Not on file  Stress: Not on file  Social Connections: Not on file  Depression (PHQ2-9): Medium Risk (04/17/2024)   Depression (PHQ2-9)    PHQ-2 Score: 9  Alcohol Screen: Not on file  Housing: Not on file  Utilities: Not on file  Health Literacy: Not on file    Family History: Family History  Problem Relation Age of Onset   Diabetes Mother    Hypertension Mother    Migraines Father    Diabetes Maternal Grandmother    Hypertension Maternal Grandmother    Cancer Maternal Grandmother  Anesthesia problems Neg Hx    Other Neg Hx     Allergies: Allergies[1]  No medications prior to admission.        Review of Systems   Constitutional: Negative for fever and chills Eyes: Negative for visual disturbances Respiratory: Negative for shortness of breath, dyspnea Cardiovascular: Negative for chest pain or palpitations  Gastrointestinal: Negative for abdominal pain, vomiting, diarrhea and constipation.   Genitourinary: Negative for dysuria and urgency Musculoskeletal: Negative for back pain, joint pain, myalgias  Neurological: Negative for dizziness and headaches      There were no vitals taken for this visit. General appearance: {general exam:16600} Lungs: normal respiratory effort Heart: regular rate and rhythm Abdomen: soft, non-tender; bowel sounds normal Extremities: Homans sign is negative, no sign of DVT DTR's  2+ Presentation: {desc; fetal presentation:14558} Fetal monitoring  {findings; monitor fetal heart monitor:31527} Uterine activity  {Uterine contractions:31516}      Prenatal labs: ABO, Rh: O/Positive/-- (07/16 1609) Antibody: Negative (07/16 1609) Rubella: immune RPR: Non Reactive (11/04 1035)  HBsAg: Negative (07/16 1609)  HIV: Non Reactive (11/04 1035)  GBS: Positive/-- (07/07 0000)  1 hr Glucola *** Genetic screening  *** Anatomy US  ***   Prenatal Transfer Tool  Maternal Diabetes: {Maternal Diabetes:3043596} Genetic Screening: {Genetic Screening:20205} Maternal Ultrasounds/Referrals: {Maternal Ultrasounds / Referrals:20211} Fetal Ultrasounds or other Referrals:  {Fetal Ultrasounds or Other Referrals:20213} Maternal Substance Abuse:  {Maternal Substance Abuse:20223} Significant Maternal Medications:  {Significant Maternal Meds:20233} Significant Maternal Lab Results: {Significant Maternal Lab Results:20235} Number of Prenatal Visits:{Prenatal Visits:27860} Maternal Vaccinations:{Maternal Immunizations:31012} Other Comments:  {Other Comments:20251}    No results found for this or any previous visit (from the past 24 hours).  Assessment: [name] [name] is a 34 y.o. [phone] with an IUP at [redacted]w[redacted]d presenting for IOL for ***.  Plan: #Labor: Cytotec ->Foley->pitocin  #Pain:  Per request #FWB Cat 1 #ID: GBS: ***  #MOF:  *** #MOC: *** #Circ: ***   [name] [name] 10/21/2024, 11:57 PM         [1] Allergies Allergen Reactions   Metronidazole  Nausea And Vomiting    Can take if given Zofran  to take it with.

## 2024-10-21 NOTE — H&P (Signed)
 [name] [name] is a 34 y.o. female [phone] with IUP at [redacted]w[redacted]d presenting for IOL for chronic hypertension. P[facility]are at [facility] since 11 wks  Prenatal History/Complications:            NURSING  PROVIDER  Office Location [facility] Dating by US  at [redacted]w[redacted]d  [facility] Model Traditional Anatomy U/S incomplete  Initiated care at  [facility] [employer]  English               LAB RESULTS   Support Person  Mother Genetics NIPS:  AFP:       NT/IT (FT only)        Carrier Screen Horizon:   Rhogam  O/Positive/-- (07/16 1609) A1C/GTT Early HgbA1C:  Third trimester 1 hr GTT:  129  Flu Vaccine No/DECLINED 09/04/24      TDaP Vaccine  08/06/24 Blood Type O/Positive/-- (07/16 1609)  RSV Vaccine   Antibody Negative (07/16 1609)  COVID Vaccine No Rubella 2.23 (07/16 1609)  Feeding Plan breast RPR Non Reactive (11/04 1035)  Contraception IUD? HBsAg Negative (07/16 1609)  Circumcision Yes if female HIV Non Reactive (11/04 1035)  Pediatrician  [facility] HCVAb Non Reactive (07/16 1609)  Prenatal Classes        BTL Consent   Pap       Diagnosis  Date Value Ref Range Status  02/03/2023     Final    - Negative for intraepithelial lesion or malignancy (NILM)    BTL Pre-payment   GC/CT Initial:   36wks:    VBAC Consent   GBS For PCN allergy, check sensitivities   BRx Optimized? [ ]  yes   [X]  no      DME Rx [X]  BP cuff [ ]  Weight Scale Waterbirth  [ ]  Class [ ]  Consent [ ]  CNM visit  PHQ9 & GAD7 [X]  new OB [  ] 28 weeks  [  ] 36 weeks Induction  [ ]  Orders Entered [ ] Foley Y/N     Past Medical History: Past Medical History:  Diagnosis Date   Anemia    Anxiety    Chlamydia 10/2011   Cholecystitis    Closed head injury with concussion    Gestational hypertension    Headache(784.0)    Heart palpitations    History of pre-eclampsia in prior pregnancy, currently pregnant    Hypertension    Right medial tibial plateau fracture 11/24/2021    Past Surgical History: Past  Surgical History:  Procedure Laterality Date   ORIF ANKLE FRACTURE Left 02/12/2016   Procedure: OPEN REDUCTION INTERNAL FIXATION (ORIF) ANKLE FRACTURE;  Surgeon: Ozell Bruch, MD;  Location: [facility] OR;  Service: Orthopedics;  Laterality: Left;   ORIF TIBIA PLATEAU Right 11/26/2021   Procedure: OPEN REDUCTION INTERNAL FIXATION (ORIF) TIBIAL PLATEAU;  Surgeon: Kendal Franky SQUIBB, MD;  Location: [facility] OR;  Service: Orthopedics;  Laterality: Right;   TONSILLECTOMY Bilateral 11/20/2023   Procedure: TONSILLECTOMY;  Surgeon: Jesus Oliphant, MD;  Location: [facility];  Service: ENT;  Laterality: Bilateral;    Obstetrical History: OB History     Gravida  6   Para  4   Term  3   Preterm  1   AB  1   Living  4      SAB  0   IAB  1   Ectopic  0   Multiple  0  Live Births  4           Social History: Social History   Socioeconomic History   Marital status: Single    Spouse name: Not on file   Number of children: Not on file   Years of education: Not on file   Highest education level: Not on file  Occupational History   Not on file  Tobacco Use   Smoking status: Every Day    Current packs/day: 0.50    Average packs/day: 0.5 packs/day for 10.0 years (5.0 ttl pk-yrs)    Types: Cigarettes   Smokeless tobacco: Never  Vaping Use   Vaping status: Never Used  Substance and Sexual Activity   Alcohol use: No    Comment: socially   Drug use: No   Sexual activity: Not Currently    Partners: Male    Birth control/protection: None  Other Topics Concern   Not on file  Social History Narrative   Not on file   Social Drivers of Health   Tobacco Use: High Risk (10/22/2024)   Patient History    Smoking Tobacco Use: Every Day    Smokeless Tobacco Use: Never    Passive Exposure: Not on file  Financial Resource Strain: Not on file  Food Insecurity: No Food Insecurity (10/22/2024)   Epic    Worried About Programme Researcher, Broadcasting/film/video in the Last Year: Never true    Ran Out of  Food in the Last Year: Never true  Transportation Needs: No Transportation Needs (10/22/2024)   Epic    Lack of Transportation (Medical): No    Lack of Transportation (Non-Medical): No  Physical Activity: Not on file  Stress: Not on file  Social Connections: Not on file  Depression (PHQ2-9): Medium Risk (04/17/2024)   Depression (PHQ2-9)    PHQ-2 Score: 9  Alcohol Screen: Not on file  Housing: High Risk (10/22/2024)   Epic    Unable to Pay for Housing in the Last Year: No    Number of Times Moved in the Last Year: 1    Homeless in the Last Year: Yes  Utilities: Not At Risk (10/22/2024)   Epic    Threatened with loss of utilities: No  Health Literacy: Not on file    Family History: Family History  Problem Relation Age of Onset   Diabetes Mother    Hypertension Mother    Migraines Father    Diabetes Maternal Grandmother    Hypertension Maternal Grandmother    Cancer Maternal Grandmother    Anesthesia problems Neg Hx    Other Neg Hx     Allergies: Allergies[1]  Medications Prior to Admission  Medication Sig Dispense Refill Last Dose/Taking   Ferric Maltol  (ACCRUFER ) 30 MG CAPS Take 1 capsule (30 mg total) by mouth 2 (two) times daily before a meal. Take 2 hrs before, or 2 hrs after a meal. 60 capsule 3 10/21/2024 Morning   NIFEdipine  (PROCARDIA -XL/NIFEDICAL-XL) 30 MG 24 hr tablet Take 1 tablet (30 mg total) by mouth daily. 30 tablet 3 10/21/2024 Morning   Prenatal Vit-Fe Fumarate-FA (PRENATAL COMPLETE) 14-0.4 MG TABS Take 1 tablet by mouth daily. 60 tablet 3 10/21/2024 Morning   aspirin  EC 81 MG tablet Take 1 tablet (81 mg total) by mouth daily. Swallow whole. Start at 12 weeks and continue rest of pregnancy (Patient not taking: Reported on 07/17/2024) 90 tablet 2    Blood Pressure Monitoring (BLOOD PRESSURE KIT) DEVI 1 Device by Does not apply route once a week. (  Patient not taking: Reported on 07/17/2024) 1 each 0         Review of Systems   Constitutional: Negative for  fever and chills Eyes: Negative for visual disturbances Respiratory: Negative for shortness of breath, dyspnea Cardiovascular: Negative for chest pain or palpitations  Gastrointestinal: Negative for abdominal pain, vomiting, diarrhea and constipation.   Genitourinary: Negative for dysuria and urgency Musculoskeletal: Negative for back pain, joint pain, myalgias  Neurological: Negative for dizziness and headaches      Blood pressure (!) 147/84, pulse 96, temperature 98.1 F (36.7 C), temperature source Oral, height 5' 6 (1.676 m), weight 123.7 kg. General appearance: alert, cooperative, and no distress Lungs: normal respiratory effort Heart: regular rate and rhythm Abdomen: soft, non-tender; bowel sounds normal Extremities: Homans sign is negative, no sign of DVT DTR's 2+ Presentation: cephalic Fetal monitoring  Baseline: 140 bpm, Variability: Good {> 6 bpm), Accelerations: Reactive, and Decelerations: Absent Uterine activity  None  Dilation: 1 Effacement (%): Thick Station: -3 Exam by:: Teona Vargus CNM   Prenatal labs: ABO, Rh: O/Positive/-- (07/16 1609) Antibody: Negative (07/16 1609) Rubella: immune RPR: Non Reactive (11/04 1035)  HBsAg: Negative (07/16 1609)  HIV: Non Reactive (11/04 1035)  GBS: Positive/-- (07/07 0000)  1 hr Glucola 129 Genetic screening  LR Anatomy US  Complete   Prenatal Transfer Tool  Maternal Diabetes: No Genetic Screening: Normal Maternal Ultrasounds/Referrals: Normal Fetal Ultrasounds or other Referrals:  None Maternal Substance Abuse:  No Significant Maternal Medications:  Meds include: Other:  Significant Maternal Lab Results: Group B Strep positive Number of Prenatal Visits:greater than 3 verified prenatal visits Maternal Vaccinations:TDap Other Comments:  None    No results found for this or any previous visit (from the past 24 hours).  Assessment: [name] [name] is a 34 y.o. [phone] with an IUP at [redacted]w[redacted]d presenting for IOL for  chronic hypertension on meds.  Plan: #Labor: Cytotec ->Foley->pitocin  #Pain:  Per request #FWB Cat 1 #ID: GBS: Positive - PCN #MOF:  Breast #MOC: Unsure #Circ: N/A   Krysti Hickling R Canesha Tesfaye 10/22/2024, 2:52 AM       [1]  Allergies Allergen Reactions   Metronidazole  Nausea And Vomiting    Can take if given Zofran  to take it with.

## 2024-10-22 ENCOUNTER — Inpatient Hospital Stay (HOSPITAL_COMMUNITY)

## 2024-10-22 ENCOUNTER — Inpatient Hospital Stay (HOSPITAL_COMMUNITY): Admitting: Anesthesiology

## 2024-10-22 ENCOUNTER — Encounter (HOSPITAL_COMMUNITY): Payer: Self-pay | Admitting: Obstetrics and Gynecology

## 2024-10-22 ENCOUNTER — Inpatient Hospital Stay (HOSPITAL_COMMUNITY)
Admission: RE | Admit: 2024-10-22 | Discharge: 2024-10-25 | DRG: 806 | Disposition: A | Discharge status: Home / Self Care | Attending: Obstetrics and Gynecology | Admitting: Obstetrics and Gynecology

## 2024-10-22 DIAGNOSIS — O43893 Other placental disorders, third trimester: Secondary | ICD-10-CM | POA: Diagnosis present

## 2024-10-22 DIAGNOSIS — D62 Acute posthemorrhagic anemia: Secondary | ICD-10-CM | POA: Diagnosis not present

## 2024-10-22 DIAGNOSIS — F17210 Nicotine dependence, cigarettes, uncomplicated: Secondary | ICD-10-CM | POA: Diagnosis present

## 2024-10-22 DIAGNOSIS — Z833 Family history of diabetes mellitus: Secondary | ICD-10-CM | POA: Diagnosis not present

## 2024-10-22 DIAGNOSIS — Z3A38 38 weeks gestation of pregnancy: Secondary | ICD-10-CM | POA: Diagnosis not present

## 2024-10-22 DIAGNOSIS — O0990 Supervision of high risk pregnancy, unspecified, unspecified trimester: Secondary | ICD-10-CM

## 2024-10-22 DIAGNOSIS — O99824 Streptococcus B carrier state complicating childbirth: Secondary | ICD-10-CM | POA: Diagnosis present

## 2024-10-22 DIAGNOSIS — O1092 Unspecified pre-existing hypertension complicating childbirth: Secondary | ICD-10-CM | POA: Diagnosis present

## 2024-10-22 DIAGNOSIS — Z8249 Family history of ischemic heart disease and other diseases of the circulatory system: Secondary | ICD-10-CM

## 2024-10-22 DIAGNOSIS — O9081 Anemia of the puerperium: Secondary | ICD-10-CM | POA: Diagnosis not present

## 2024-10-22 DIAGNOSIS — R8271 Bacteriuria: Secondary | ICD-10-CM | POA: Diagnosis present

## 2024-10-22 DIAGNOSIS — O119 Pre-existing hypertension with pre-eclampsia, unspecified trimester: Secondary | ICD-10-CM | POA: Insufficient documentation

## 2024-10-22 DIAGNOSIS — O09299 Supervision of pregnancy with other poor reproductive or obstetric history, unspecified trimester: Principal | ICD-10-CM

## 2024-10-22 DIAGNOSIS — O114 Pre-existing hypertension with pre-eclampsia, complicating childbirth: Principal | ICD-10-CM | POA: Diagnosis present

## 2024-10-22 LAB — COMPREHENSIVE METABOLIC PANEL WITH GFR
ALT: 90 U/L — ABNORMAL HIGH (ref 0–44)
ALT: 96 U/L — ABNORMAL HIGH (ref 0–44)
ALT: 115 U/L — ABNORMAL HIGH (ref 0–44)
AST: 74 U/L — ABNORMAL HIGH (ref 15–41)
AST: 74 U/L — ABNORMAL HIGH (ref 15–41)
AST: 92 U/L — ABNORMAL HIGH (ref 15–41)
Albumin: 3.1 g/dL — ABNORMAL LOW (ref 3.5–5.0)
Albumin: 3 g/dL — ABNORMAL LOW (ref 3.5–5.0)
Albumin: 3.1 g/dL — ABNORMAL LOW (ref 3.5–5.0)
Alkaline Phosphatase: 119 U/L (ref 38–126)
Alkaline Phosphatase: 126 U/L (ref 38–126)
Alkaline Phosphatase: 169 U/L — ABNORMAL HIGH (ref 38–126)
Anion gap: 12 (ref 5–15)
Anion gap: 13 (ref 5–15)
Anion gap: 12 (ref 5–15)
BUN: 5 mg/dL — ABNORMAL LOW (ref 6–20)
BUN: <5 mg/dL — ABNORMAL LOW (ref 6–20)
BUN: <5 mg/dL — ABNORMAL LOW (ref 6–20)
CO2: 18 mmol/L — ABNORMAL LOW (ref 22–32)
CO2: 17 mmol/L — ABNORMAL LOW (ref 22–32)
CO2: 21 mmol/L — ABNORMAL LOW (ref 22–32)
Calcium: 8.7 mg/dL — ABNORMAL LOW (ref 8.9–10.3)
Calcium: 8.4 mg/dL — ABNORMAL LOW (ref 8.9–10.3)
Calcium: 8.1 mg/dL — ABNORMAL LOW (ref 8.9–10.3)
Chloride: 103 mmol/L (ref 98–111)
Chloride: 104 mmol/L (ref 98–111)
Chloride: 105 mmol/L (ref 98–111)
Creatinine, Ser: 0.43 mg/dL — ABNORMAL LOW (ref 0.44–1.00)
Creatinine, Ser: 0.39 mg/dL — ABNORMAL LOW (ref 0.44–1.00)
Creatinine, Ser: 0.44 mg/dL (ref 0.44–1.00)
GFR, Estimated: >60 mL/min
GFR, Estimated: >60 mL/min
GFR, Estimated: >60 mL/min
Glucose, Bld: 113 mg/dL — ABNORMAL HIGH (ref 70–99)
Glucose, Bld: 85 mg/dL (ref 70–99)
Glucose, Bld: 89 mg/dL (ref 70–99)
Potassium: 3.9 mmol/L (ref 3.5–5.1)
Potassium: 3.8 mmol/L (ref 3.5–5.1)
Potassium: 3.8 mmol/L (ref 3.5–5.1)
Sodium: 133 mmol/L — ABNORMAL LOW (ref 135–145)
Sodium: 134 mmol/L — ABNORMAL LOW (ref 135–145)
Sodium: 138 mmol/L (ref 135–145)
Total Bilirubin: <0.2 mg/dL (ref 0.0–1.2)
Total Bilirubin: 0.3 mg/dL (ref 0.0–1.2)
Total Bilirubin: 0.3 mg/dL (ref 0.0–1.2)
Total Protein: 6.9 g/dL (ref 6.5–8.1)
Total Protein: 6.5 g/dL (ref 6.5–8.1)
Total Protein: 6.5 g/dL (ref 6.5–8.1)

## 2024-10-22 LAB — CBC
HCT: 34.7 % — ABNORMAL LOW (ref 36.0–46.0)
HCT: 29.6 % — ABNORMAL LOW (ref 36.0–46.0)
HCT: 31.6 % — ABNORMAL LOW (ref 36.0–46.0)
Hemoglobin: 10.6 g/dL — ABNORMAL LOW (ref 12.0–15.0)
Hemoglobin: 9.5 g/dL — ABNORMAL LOW (ref 12.0–15.0)
Hemoglobin: 10.1 g/dL — ABNORMAL LOW (ref 12.0–15.0)
MCH: 21.8 pg — ABNORMAL LOW (ref 26.0–34.0)
MCH: 22 pg — ABNORMAL LOW (ref 26.0–34.0)
MCH: 22 pg — ABNORMAL LOW (ref 26.0–34.0)
MCHC: 30.5 g/dL (ref 30.0–36.0)
MCHC: 32.1 g/dL (ref 30.0–36.0)
MCHC: 32 g/dL (ref 30.0–36.0)
MCV: 71.3 fL — ABNORMAL LOW (ref 80.0–100.0)
MCV: 68.7 fL — ABNORMAL LOW (ref 80.0–100.0)
MCV: 68.8 fL — ABNORMAL LOW (ref 80.0–100.0)
Platelets: 151 K/uL (ref 150–400)
Platelets: 308 K/uL (ref 150–400)
Platelets: 321 K/uL (ref 150–400)
RBC: 4.87 MIL/uL (ref 3.87–5.11)
RBC: 4.31 MIL/uL (ref 3.87–5.11)
RBC: 4.59 MIL/uL (ref 3.87–5.11)
RDW: 15 % (ref 11.5–15.5)
RDW: 14.7 % (ref 11.5–15.5)
RDW: 14.8 % (ref 11.5–15.5)
WBC: 9 K/uL (ref 4.0–10.5)
WBC: 10.6 K/uL — ABNORMAL HIGH (ref 4.0–10.5)
WBC: 9.1 K/uL (ref 4.0–10.5)
nRBC: 0 % (ref 0.0–0.2)
nRBC: 0 % (ref 0.0–0.2)
nRBC: 0 % (ref 0.0–0.2)

## 2024-10-22 LAB — PROTEIN / CREATININE RATIO, URINE
Creatinine, Urine: 199 mg/dL
Protein Creatinine Ratio: 0.6 mg/mg — ABNORMAL HIGH
Total Protein, Urine: 113 mg/dL

## 2024-10-22 LAB — TYPE AND SCREEN
ABO/RH(D): O POS
Antibody Screen: NEGATIVE

## 2024-10-22 LAB — SYPHILIS: RPR W/REFLEX TO RPR TITER AND TREPONEMAL ANTIBODIES, TRADITIONAL SCREENING AND DIAGNOSIS ALGORITHM: RPR Ser Ql: NONREACTIVE

## 2024-10-22 MED ORDER — NICOTINE 14 MG/24HR TD PT24
14.0000 mg | MEDICATED_PATCH | Freq: Every day | TRANSDERMAL | Status: DC
  Administered 2024-10-22: 14 mg via TRANSDERMAL
  Filled 2024-10-22 (×2): qty 1

## 2024-10-22 MED ORDER — DIPHENHYDRAMINE HCL 50 MG/ML IJ SOLN: 12.5000 mg | INTRAMUSCULAR | Status: DC | PRN

## 2024-10-22 MED ORDER — MAGNESIUM SULFATE BOLUS VIA INFUSION
4.0000 g | Freq: Once | INTRAVENOUS | Status: AC
  Administered 2024-10-22: 4 g via INTRAVENOUS
  Filled 2024-10-22: qty 1000

## 2024-10-22 MED ORDER — MISOPROSTOL 50MCG HALF TABLET
50.0000 ug | ORAL_TABLET | Freq: Once | ORAL | Status: AC
  Administered 2024-10-22: 50 ug via ORAL
  Filled 2024-10-22: qty 1

## 2024-10-22 MED ORDER — NIFEDIPINE 10 MG PO CAPS: 20.0000 mg | ORAL_CAPSULE | ORAL | Status: DC | PRN

## 2024-10-22 MED ORDER — MISOPROSTOL 25 MCG QUARTER TABLET
25.0000 ug | ORAL_TABLET | Freq: Once | ORAL | Status: AC
  Administered 2024-10-22: 25 ug via VAGINAL
  Filled 2024-10-22: qty 1

## 2024-10-22 MED ORDER — SOD CITRATE-CITRIC ACID 500-334 MG/5ML PO SOLN: 30.0000 mL | ORAL | Status: DC | PRN

## 2024-10-22 MED ORDER — LIDOCAINE HCL (PF) 1 % IJ SOLN: 30.0000 mL | INTRAMUSCULAR | Status: DC | PRN

## 2024-10-22 MED ORDER — ACETAMINOPHEN 325 MG PO TABS: 650.0000 mg | ORAL_TABLET | ORAL | Status: DC | PRN

## 2024-10-22 MED ORDER — LIDOCAINE-EPINEPHRINE (PF) 1.5 %-1:200000 IJ SOLN
INTRAMUSCULAR | Status: DC | PRN
  Administered 2024-10-22: 5 mL via EPIDURAL

## 2024-10-22 MED ORDER — LACTATED RINGERS IV SOLN: INTRAVENOUS | Status: DC

## 2024-10-22 MED ORDER — OXYTOCIN-SODIUM CHLORIDE 30-0.9 UT/500ML-% IV SOLN
2.5000 [IU]/h | INTRAVENOUS | Status: DC
  Administered 2024-10-23: 2.5 [IU]/h via INTRAVENOUS
  Filled 2024-10-22: qty 500

## 2024-10-22 MED ORDER — PHENYLEPHRINE 80 MCG/ML (10ML) SYRINGE FOR IV PUSH (FOR BLOOD PRESSURE SUPPORT): 80.0000 ug | PREFILLED_SYRINGE | INTRAVENOUS | Status: DC | PRN

## 2024-10-22 MED ORDER — NIFEDIPINE ER OSMOTIC RELEASE 30 MG PO TB24
30.0000 mg | ORAL_TABLET | Freq: Every day | ORAL | Status: DC
  Administered 2024-10-22 – 2024-10-23 (×2): 30 mg via ORAL
  Filled 2024-10-22 (×2): qty 1

## 2024-10-22 MED ORDER — FENTANYL CITRATE (PF) 100 MCG/2ML IJ SOLN
50.0000 ug | INTRAMUSCULAR | Status: DC | PRN
  Administered 2024-10-22 (×2): 100 ug via INTRAVENOUS
  Filled 2024-10-22 (×3): qty 2

## 2024-10-22 MED ORDER — LABETALOL HCL 5 MG/ML IV SOLN: 40.0000 mg | INTRAVENOUS | Status: DC | PRN

## 2024-10-22 MED ORDER — MAGNESIUM SULFATE 40 GM/1000ML IV SOLN
2.0000 g/h | INTRAVENOUS | Status: AC
  Administered 2024-10-22 – 2024-10-23 (×3): 2 g/h via INTRAVENOUS
  Filled 2024-10-22 (×2): qty 1000

## 2024-10-22 MED ORDER — FENTANYL-BUPIVACAINE-NACL 0.5-0.125-0.9 MG/250ML-% EP SOLN
12.0000 mL/h | EPIDURAL | Status: DC | PRN
  Administered 2024-10-22: 12 mL/h via EPIDURAL
  Filled 2024-10-22: qty 250

## 2024-10-22 MED ORDER — TERBUTALINE SULFATE 1 MG/ML IJ SOLN: 0.2500 mg | Freq: Once | INTRAMUSCULAR | Status: DC | PRN

## 2024-10-22 MED ORDER — PENICILLIN G POT IN DEXTROSE 60000 UNIT/ML IV SOLN
3.0000 10*6.[IU] | INTRAVENOUS | Status: DC
  Administered 2024-10-22 – 2024-10-23 (×5): 3 10*6.[IU] via INTRAVENOUS
  Filled 2024-10-22 (×8): qty 50

## 2024-10-22 MED ORDER — LACTATED RINGERS IV SOLN
500.0000 mL | Freq: Once | INTRAVENOUS | Status: AC
  Administered 2024-10-22: 500 mL via INTRAVENOUS

## 2024-10-22 MED ORDER — OXYTOCIN-SODIUM CHLORIDE 30-0.9 UT/500ML-% IV SOLN
1.0000 m[IU]/min | INTRAVENOUS | Status: DC
  Administered 2024-10-22: 2 m[IU]/min via INTRAVENOUS
  Filled 2024-10-22: qty 500

## 2024-10-22 MED ORDER — OXYTOCIN BOLUS FROM INFUSION
333.0000 mL | Freq: Once | INTRAVENOUS | Status: AC
  Administered 2024-10-23: 333 mL via INTRAVENOUS

## 2024-10-22 MED ORDER — LACTATED RINGERS IV SOLN: 500.0000 mL | INTRAVENOUS | Status: DC | PRN

## 2024-10-22 MED ORDER — ONDANSETRON HCL 4 MG/2ML IJ SOLN: 4.0000 mg | Freq: Four times a day (QID) | INTRAMUSCULAR | Status: DC | PRN

## 2024-10-22 MED ORDER — EPHEDRINE 5 MG/ML INJ: 10.0000 mg | INTRAVENOUS | Status: DC | PRN

## 2024-10-22 MED ORDER — NIFEDIPINE 10 MG PO CAPS: 10.0000 mg | ORAL_CAPSULE | ORAL | Status: DC | PRN

## 2024-10-22 MED ORDER — OXYCODONE-ACETAMINOPHEN 5-325 MG PO TABS: 1.0000 | ORAL_TABLET | ORAL | Status: DC | PRN

## 2024-10-22 MED ORDER — SODIUM CHLORIDE 0.9 % IV SOLN
5.0000 10*6.[IU] | Freq: Once | INTRAVENOUS | Status: AC
  Administered 2024-10-22: 5 10*6.[IU] via INTRAVENOUS
  Filled 2024-10-22: qty 5

## 2024-10-22 NOTE — Progress Notes (Signed)
 [name] [name] is a 34 y.o. H3E6885 at [redacted]w[redacted]d by ultrasound admitted for induction of labor due to chronic HTN on meds.  Subjective:   Objective: BP (!) 121/55   Pulse 88   Temp 98.1 F (36.7 C) (Oral)   Ht 5' 6 (1.676 m)   Wt 123.7 kg   LMP  (LMP Unknown)   BMI 44.00 kg/m  No intake/output data recorded. No intake/output data recorded.  FHT:  FHR: 130 bpm, variability: moderate,  accelerations:  Present,  decelerations:  Absent UC:   regular, every 3-5 minutes SVE:   Dilation: 2 Effacement (%): 50 Station: -2 Exam by:: Kadarrius Yanke CNM  Labs: Lab Results  Component Value Date   WBC 9.0 10/22/2024   HGB 10.6 (L) 10/22/2024   HCT 34.7 (L) 10/22/2024   MCV 71.3 (L) 10/22/2024   PLT 151 10/22/2024    Assessment / Plan: Induction of labor due to cHTN on meds,progressing well on BCyto  Labor: Progressing normally Preeclampsia:  Labs being redrawn Fetal Wellbeing:  Category I Pain Control:  Labor support without medications I/D:  n/a Anticipated MOD:  NSVD  Geena Weinhold R Annalena Piatt, CNM 10/22/2024, 7:02 AM

## 2024-10-22 NOTE — Progress Notes (Signed)
 Patient ID: TYQUASIA PANT, female   DOB: [birth year], 34 y.o.   MRN: [id number]  Blood pressure (!) 141/87, pulse 89, temperature 97.9 F (36.6 C), temperature source Oral, resp. rate 16, height 5' 6 (1.676 m), weight 123.7 kg.  Dilation: 2 Effacement (%): 50 Station: -3 Presentation: Vertex Exam by:: Lima, rn  FHT: baseline 130/moderate variability/+accels/-decels/ctx q5-80min  Seen at bedside to introduce daytime provider team. Patient requesting but unsure about cervical check at this time, decided to wait until later for exam. All questions answered at bedside. Currently on pitocin  8 for augmentation.  Charlie DELENA Courts, MD

## 2024-10-22 NOTE — Progress Notes (Signed)
 Patient ID: [name] [name], female   DOB: 04-29-1991, 34 y.o.   MRN: [id number]  Subjective: -Care assumed of 34 y.o. H3E6885 at [redacted]w[redacted]d who presents for IOL s/t CHTN.  Patient now with SI PreEclampsia.  In room to meet acquaintance of patient and family.  Patient comfortable with epidural.  Denies HA, visual disturbances, or RUQ pain.   Objective: BP (!) 152/92   Pulse 90   Temp 98.4 F (36.9 C) (Axillary)   Resp 16   Ht 5' 6 (1.676 m)   Wt 123.7 kg   LMP  (LMP Unknown)   SpO2 100%   BMI 44.00 kg/m  No intake/output data recorded.  No intake/output data recorded. Results for orders placed or performed during the hospital encounter of 10/22/24 (from the past 24 hours)  CBC     Status: Abnormal   Collection Time: 10/22/24  2:55 AM  Result Value Ref Range   WBC 9.0 4.0 - 10.5 K/uL   RBC 4.87 3.87 - 5.11 MIL/uL   Hemoglobin 10.6 (L) 12.0 - 15.0 g/dL   HCT 65.2 (L) 63.9 - 53.9 %   MCV 71.3 (L) 80.0 - 100.0 fL   MCH 21.8 (L) 26.0 - 34.0 pg   MCHC 30.5 30.0 - 36.0 g/dL   RDW 84.9 88.4 - 84.4 %   Platelets 151 150 - 400 K/uL   nRBC 0.0 0.0 - 0.2 %  Type and screen     Status: None   Collection Time: 10/22/24  2:55 AM  Result Value Ref Range   ABO/RH(D) O POS    Antibody Screen NEG    Sample Expiration      10/25/2024,2359 Performed at [facility], [address], [place], [place] [address]   RPR     Status: None   Collection Time: 10/22/24  2:55 AM  Result Value Ref Range   RPR Ser Ql NON REACTIVE NON REACTIVE  Comprehensive metabolic panel     Status: Abnormal   Collection Time: 10/22/24  2:55 AM  Result Value Ref Range   Sodium 133 (L) 135 - 145 mmol/L   Potassium 3.9 3.5 - 5.1 mmol/L   Chloride 103 98 - 111 mmol/L   CO2 18 (L) 22 - 32 mmol/L   Glucose, Bld 113 (H) 70 - 99 mg/dL   BUN 5 (L) 6 - 20 mg/dL   Creatinine, Ser 9.56 (L) 0.44 - 1.00 mg/dL   Calcium  8.7 (L) 8.9 - 10.3 mg/dL   Total Protein 6.9 6.5 - 8.1 g/dL   Albumin 3.1 (L) 3.5 - 5.0 g/dL   AST 74  (H) 15 - 41 U/L   ALT 90 (H) 0 - 44 U/L   Alkaline Phosphatase 119 38 - 126 U/L   Total Bilirubin <0.2 0.0 - 1.2 mg/dL   GFR, Estimated >39 >39 mL/min   Anion gap 12 5 - 15  Protein / creatinine ratio, urine     Status: Abnormal   Collection Time: 10/22/24  6:12 AM  Result Value Ref Range   Creatinine, Urine 199 mg/dL   Total Protein, Urine 113 mg/dL   Protein Creatinine Ratio 0.6 (H) <0.2 mg/mg  Comprehensive metabolic panel     Status: Abnormal   Collection Time: 10/22/24  6:12 AM  Result Value Ref Range   Sodium 134 (L) 135 - 145 mmol/L   Potassium 3.8 3.5 - 5.1 mmol/L   Chloride 104 98 - 111 mmol/L   CO2 17 (L) 22 - 32 mmol/L  Glucose, Bld 85 70 - 99 mg/dL   BUN <5 (L) 6 - 20 mg/dL   Creatinine, Ser 9.60 (L) 0.44 - 1.00 mg/dL   Calcium  8.4 (L) 8.9 - 10.3 mg/dL   Total Protein 6.5 6.5 - 8.1 g/dL   Albumin 3.0 (L) 3.5 - 5.0 g/dL   AST 74 (H) 15 - 41 U/L   ALT 96 (H) 0 - 44 U/L   Alkaline Phosphatase 126 38 - 126 U/L   Total Bilirubin 0.3 0.0 - 1.2 mg/dL   GFR, Estimated >39 >39 mL/min   Anion gap 13 5 - 15  CBC     Status: Abnormal   Collection Time: 10/22/24  8:12 AM  Result Value Ref Range   WBC 10.6 (H) 4.0 - 10.5 K/uL   RBC 4.31 3.87 - 5.11 MIL/uL   Hemoglobin 9.5 (L) 12.0 - 15.0 g/dL   HCT 70.3 (L) 63.9 - 53.9 %   MCV 68.7 (L) 80.0 - 100.0 fL   MCH 22.0 (L) 26.0 - 34.0 pg   MCHC 32.1 30.0 - 36.0 g/dL   RDW 85.2 88.4 - 84.4 %   Platelets 308 150 - 400 K/uL   nRBC 0.0 0.0 - 0.2 %  CBC     Status: Abnormal   Collection Time: 10/22/24  3:34 PM  Result Value Ref Range   WBC 9.1 4.0 - 10.5 K/uL   RBC 4.59 3.87 - 5.11 MIL/uL   Hemoglobin 10.1 (L) 12.0 - 15.0 g/dL   HCT 68.3 (L) 63.9 - 53.9 %   MCV 68.8 (L) 80.0 - 100.0 fL   MCH 22.0 (L) 26.0 - 34.0 pg   MCHC 32.0 30.0 - 36.0 g/dL   RDW 85.1 88.4 - 84.4 %   Platelets 321 150 - 400 K/uL   nRBC 0.0 0.0 - 0.2 %  Comprehensive metabolic panel with GFR     Status: Abnormal   Collection Time: 10/22/24  5:02 PM   Result Value Ref Range   Sodium 138 135 - 145 mmol/L   Potassium 3.8 3.5 - 5.1 mmol/L   Chloride 105 98 - 111 mmol/L   CO2 21 (L) 22 - 32 mmol/L   Glucose, Bld 89 70 - 99 mg/dL   BUN <5 (L) 6 - 20 mg/dL   Creatinine, Ser 9.55 0.44 - 1.00 mg/dL   Calcium  8.1 (L) 8.9 - 10.3 mg/dL   Total Protein 6.5 6.5 - 8.1 g/dL   Albumin 3.1 (L) 3.5 - 5.0 g/dL   AST 92 (H) 15 - 41 U/L   ALT 115 (H) 0 - 44 U/L   Alkaline Phosphatase 169 (H) 38 - 126 U/L   Total Bilirubin 0.3 0.0 - 1.2 mg/dL   GFR, Estimated >39 >39 mL/min   Anion gap 12 5 - 15    Fetal Monitoring: FHT: 135 bpm, Mod Var, -Decels, +15x15 Accels UC: Q1-28min    Physical Exam: General appearance: alert, well appearing, and in no distress. Chest: normal rate and regular rhythm.  clear to auscultation, no wheezes, rales or rhonchi, symmetric air entry. Abdominal exam: soft, nontender, nondistended, no masses or organomegaly. Extremities: Mild Edema Skin exam: Warm Dry  Vaginal Exam: SVE:   Dilation: 4.5 Effacement (%): 50 Station: -3 Exam by:: Harlene Duncans CNM Membranes: AROM with mod. Bloody fluid Internal Monitors: None  Augmentation/Induction: Pitocin :16mUn/min Cytotec : S/P Dual Dosing  Assessment:  IUP at 38.1 weeks Cat I FT  CHTN with SI PreE Amniotomy  Plan: -Discussed recent lab results diagnostic  of PreEclampsia.  Reviewed recommendation for initiation of magnesium  sulfate.  Educated on anticipated side effects, benefits, and duration of usage.  -Patient states she may have had in the past and is agreeable.  -Discussed AROM r/b including increased risk of infection, cord prolapse, fetal intolerance, and decreased labor time.  -No questions or concerns and patient desires to proceed with AROM.  -Continue other mgmt as ordered.  Harlene CROME Maximus Hoffert,MSN, CNM 10/22/2024, 8:09 PM

## 2024-10-22 NOTE — Anesthesia Procedure Notes (Signed)
 Epidural Patient location during procedure: OB Start time: 10/22/2024 7:03 PM End time: 10/22/2024 7:10 PM  Staffing Anesthesiologist: Dorethea Cordella SQUIBB, DO Performed: anesthesiologist   Preanesthetic Checklist Completed: patient identified, IV checked, site marked, risks and benefits discussed, surgical consent, monitors and equipment checked, pre-op evaluation and timeout performed  Epidural Patient position: sitting Prep: ChloraPrep Patient monitoring: heart rate, continuous pulse ox and blood pressure Approach: midline Location: L4-L5 Injection technique: LOR saline  Needle:  Needle type: Tuohy  Needle gauge: 17 G Needle length: 9 cm Needle insertion depth: 8 cm Catheter type: closed end flexible Catheter size: 20 Guage Catheter at skin depth: 14 cm Test dose: negative and 1.5% lidocaine  with Epi 1:200 K  Assessment Events: blood not aspirated, no cerebrospinal fluid, injection not painful, no injection resistance and no paresthesia  Additional Notes Patient identified. Risks/Benefits/Options discussed with patient including but not limited to bleeding, infection, nerve damage, paralysis, failed block, incomplete pain control, headache, blood pressure changes, nausea, vomiting, reactions to medications, itching and postpartum back pain. Confirmed with bedside nurse the patient's most recent platelet count. Confirmed with patient that they are not currently taking any anticoagulation, have any bleeding history or any family history of bleeding disorders. Patient expressed understanding and wished to proceed. All questions were answered. Sterile technique was used throughout the entire procedure. Please see nursing notes for vital signs. Test dose was given through epidural catheter and negative prior to continuing to dose epidural or start infusion. Warning signs of high block given to the patient including shortness of breath, tingling/numbness in hands, complete motor block,  or any concerning symptoms with instructions to call for help. Patient was given instructions on fall risk and not to get out of bed. All questions and concerns addressed with instructions to call with any issues or inadequate analgesia.    Reason for block:procedure for pain

## 2024-10-22 NOTE — Progress Notes (Signed)
 Patient ID: [name] [name], female   DOB: 08/19/1991, 34 y.o.   MRN: [id number]  Blood pressure (!) 146/100, pulse 92, temperature 97.9 F (36.6 C), temperature source Oral, resp. rate 16, height 5' 6 (1.676 m), weight 123.7 kg.  FHT: baseline 135/moderate variability/+accels/-decels Toco: ctx q29min  Dilation: 2 Effacement (%): 50 Station: -3 Presentation: Vertex Exam by:: Dr Jomarie  Patient seen at bedside, moving around room. SVE unchanged, discussed AROM vs foley balloon as she previously declined balloon. Would prefer to try balloon prior to AROM. Foley balloon placed without difficulty, filled with 40cc LR. Fentanyl  given prior for pain management.  Pitocin  turned down to 8 while foley balloon in place. Will also repeat CMP at 5pm due to elevated LFTs. All questions answered.  Charlie DELENA Jomarie, MD

## 2024-10-22 NOTE — Anesthesia Preprocedure Evaluation (Addendum)
 "                                  Anesthesia Evaluation  Patient identified by MRN, date of birth, ID band Patient awake    Reviewed: Allergy & Precautions, NPO status , Patient's Chart, lab work & pertinent test results  Airway Mallampati: II  TM Distance: >3 FB Neck ROM: Full    Dental no notable dental hx.    Pulmonary neg pulmonary ROS, Current Smoker and Patient abstained from smoking.   Pulmonary exam normal        Cardiovascular hypertension, Pt. on medications  Rhythm:Regular Rate:Normal     Neuro/Psych  Headaches  Anxiety        GI/Hepatic negative GI ROS, Neg liver ROS,,,  Endo/Other  negative endocrine ROS    Renal/GU negative Renal ROS  negative genitourinary   Musculoskeletal negative musculoskeletal ROS (+)    Abdominal Normal abdominal exam  (+)   Peds  Hematology  (+) Blood dyscrasia, anemia Lab Results      Component                Value               Date                      WBC                      9.1                 10/22/2024                HGB                      10.1 (L)            10/22/2024                HCT                      31.6 (L)            10/22/2024                MCV                      68.8 (L)            10/22/2024                PLT                      321                 10/22/2024              Anesthesia Other Findings   Reproductive/Obstetrics (+) Pregnancy                              Anesthesia Physical Anesthesia Plan  ASA: 2  Anesthesia Plan: Epidural   Post-op Pain Management:    Induction:   PONV Risk Score and Plan: 1 and Treatment may vary due to age or medical condition  Airway Management Planned: Natural Airway  Additional Equipment: None  Intra-op Plan:   Post-operative Plan:  Informed Consent: I have reviewed the patients History and Physical, chart, labs and discussed the procedure including the risks, benefits and alternatives for the proposed  anesthesia with the patient or authorized representative who has indicated his/her understanding and acceptance.     Dental advisory given  Plan Discussed with:   Anesthesia Plan Comments:          Anesthesia Quick Evaluation  "

## 2024-10-22 NOTE — Progress Notes (Signed)
 Patient ID: [name] [name], female   DOB: 1991/07/30, 34 y.o.   MRN: [id number]  Blood pressure 138/66, pulse 87, temperature 97.9 F (36.6 C), temperature source Oral, resp. rate 16, height 5' 6 (1.676 m), weight 123.7 kg.  FHT: baseline 130/moderate variability/+accles/-decels/ctx q2-4min  Dilation: 2 Effacement (%): 50 Station: -3 Presentation: Vertex Exam by:: Charma, rn  Seen at bedside with Manuelita Charma RN. States some contractions are stronger than others, able to breathe through them. Moved from bed to birthing ball at bedside. Pitocin  up to 18. All questions answered.  Charlie DELENA Courts, MD

## 2024-10-23 ENCOUNTER — Encounter (HOSPITAL_COMMUNITY): Payer: Self-pay | Admitting: Obstetrics and Gynecology

## 2024-10-23 ENCOUNTER — Other Ambulatory Visit

## 2024-10-23 ENCOUNTER — Other Ambulatory Visit: Payer: Self-pay

## 2024-10-23 DIAGNOSIS — O119 Pre-existing hypertension with pre-eclampsia, unspecified trimester: Secondary | ICD-10-CM | POA: Insufficient documentation

## 2024-10-23 LAB — CBC WITH DIFFERENTIAL/PLATELET
Abs Immature Granulocytes: 0.03 K/uL (ref 0.00–0.07)
Basophils Absolute: 0 K/uL (ref 0.0–0.1)
Basophils Relative: 0 %
Eosinophils Absolute: 0.1 K/uL (ref 0.0–0.5)
Eosinophils Relative: 1 %
HCT: 33.8 % — ABNORMAL LOW (ref 36.0–46.0)
Hemoglobin: 10.7 g/dL — ABNORMAL LOW (ref 12.0–15.0)
Immature Granulocytes: 0 %
Lymphocytes Relative: 18 %
Lymphs Abs: 1.9 K/uL (ref 0.7–4.0)
MCH: 22.1 pg — ABNORMAL LOW (ref 26.0–34.0)
MCHC: 31.7 g/dL (ref 30.0–36.0)
MCV: 69.7 fL — ABNORMAL LOW (ref 80.0–100.0)
Monocytes Absolute: 0.8 K/uL (ref 0.1–1.0)
Monocytes Relative: 7 %
Neutro Abs: 8.1 K/uL — ABNORMAL HIGH (ref 1.7–7.7)
Neutrophils Relative %: 74 %
Platelets: 330 K/uL (ref 150–400)
RBC: 4.85 MIL/uL (ref 3.87–5.11)
RDW: 14.8 % (ref 11.5–15.5)
WBC: 10.8 K/uL — ABNORMAL HIGH (ref 4.0–10.5)
nRBC: 0 % (ref 0.0–0.2)

## 2024-10-23 LAB — COMPREHENSIVE METABOLIC PANEL WITH GFR
ALT: 166 U/L — ABNORMAL HIGH (ref 0–44)
AST: 131 U/L — ABNORMAL HIGH (ref 15–41)
Albumin: 3.1 g/dL — ABNORMAL LOW (ref 3.5–5.0)
Alkaline Phosphatase: 155 U/L — ABNORMAL HIGH (ref 38–126)
Anion gap: 12 (ref 5–15)
BUN: <5 mg/dL — ABNORMAL LOW (ref 6–20)
CO2: 20 mmol/L — ABNORMAL LOW (ref 22–32)
Calcium: 7.7 mg/dL — ABNORMAL LOW (ref 8.9–10.3)
Chloride: 103 mmol/L (ref 98–111)
Creatinine, Ser: 0.5 mg/dL (ref 0.44–1.00)
GFR, Estimated: >60 mL/min
Glucose, Bld: 91 mg/dL (ref 70–99)
Potassium: 3.7 mmol/L (ref 3.5–5.1)
Sodium: 135 mmol/L (ref 135–145)
Total Bilirubin: 0.4 mg/dL (ref 0.0–1.2)
Total Protein: 7.1 g/dL (ref 6.5–8.1)

## 2024-10-23 MED ORDER — SENNOSIDES-DOCUSATE SODIUM 8.6-50 MG PO TABS
2.0000 | ORAL_TABLET | ORAL | Status: DC
  Administered 2024-10-24 – 2024-10-25 (×2): 2 via ORAL
  Filled 2024-10-23 (×3): qty 2

## 2024-10-23 MED ORDER — NICOTINE 14 MG/24HR TD PT24
14.0000 mg | MEDICATED_PATCH | Freq: Every day | TRANSDERMAL | Status: DC
  Administered 2024-10-23 – 2024-10-24 (×2): 14 mg via TRANSDERMAL
  Filled 2024-10-23 (×2): qty 1

## 2024-10-23 MED ORDER — ONDANSETRON HCL 4 MG PO TABS: 4.0000 mg | ORAL_TABLET | ORAL | Status: DC | PRN

## 2024-10-23 MED ORDER — DIPHENHYDRAMINE HCL 25 MG PO CAPS: 25.0000 mg | ORAL_CAPSULE | Freq: Four times a day (QID) | ORAL | Status: DC | PRN

## 2024-10-23 MED ORDER — WITCH HAZEL-GLYCERIN EX PADS: 1.0000 | MEDICATED_PAD | CUTANEOUS | Status: DC | PRN

## 2024-10-23 MED ORDER — FUROSEMIDE 20 MG PO TABS
20.0000 mg | ORAL_TABLET | Freq: Every day | ORAL | Status: DC
  Administered 2024-10-24 – 2024-10-25 (×2): 20 mg via ORAL
  Filled 2024-10-23 (×2): qty 1

## 2024-10-23 MED ORDER — ONDANSETRON HCL 4 MG/2ML IJ SOLN: 4.0000 mg | INTRAMUSCULAR | Status: DC | PRN

## 2024-10-23 MED ORDER — PRENATAL MULTIVITAMIN CH
1.0000 | ORAL_TABLET | Freq: Every day | ORAL | Status: DC
  Administered 2024-10-23 – 2024-10-24 (×2): 1 via ORAL
  Filled 2024-10-23 (×2): qty 1

## 2024-10-23 MED ORDER — TETANUS-DIPHTH-ACELL PERTUSSIS 5-2-15.5 LF-MCG/0.5 IM SUSP: 0.5000 mL | Freq: Once | INTRAMUSCULAR | Status: DC

## 2024-10-23 MED ORDER — NIFEDIPINE ER OSMOTIC RELEASE 60 MG PO TB24
60.0000 mg | ORAL_TABLET | Freq: Every day | ORAL | Status: DC
  Administered 2024-10-24: 60 mg via ORAL
  Filled 2024-10-23: qty 1

## 2024-10-23 MED ORDER — BENZOCAINE-MENTHOL 20-0.5 % EX AERO: 1.0000 | INHALATION_SPRAY | CUTANEOUS | Status: DC | PRN

## 2024-10-23 MED ORDER — OXYCODONE HCL 5 MG PO TABS
10.0000 mg | ORAL_TABLET | ORAL | Status: DC | PRN
  Administered 2024-10-23 – 2024-10-25 (×4): 10 mg via ORAL
  Filled 2024-10-23 (×4): qty 2

## 2024-10-23 MED ORDER — POTASSIUM CHLORIDE CRYS ER 20 MEQ PO TBCR
20.0000 meq | EXTENDED_RELEASE_TABLET | Freq: Every day | ORAL | Status: DC
  Administered 2024-10-24 – 2024-10-25 (×2): 20 meq via ORAL
  Filled 2024-10-23 (×2): qty 1

## 2024-10-23 MED ORDER — SIMETHICONE 80 MG PO CHEW: 80.0000 mg | CHEWABLE_TABLET | ORAL | Status: DC | PRN

## 2024-10-23 MED ORDER — ACETAMINOPHEN 325 MG PO TABS
650.0000 mg | ORAL_TABLET | ORAL | Status: DC | PRN
  Administered 2024-10-23 – 2024-10-25 (×3): 650 mg via ORAL
  Filled 2024-10-23 (×3): qty 2

## 2024-10-23 MED ORDER — OXYCODONE HCL 5 MG PO TABS
5.0000 mg | ORAL_TABLET | ORAL | Status: DC | PRN
  Administered 2024-10-24: 5 mg via ORAL
  Filled 2024-10-23: qty 1

## 2024-10-23 MED ORDER — DIBUCAINE (PERIANAL) 1 % EX OINT: 1.0000 | TOPICAL_OINTMENT | CUTANEOUS | Status: DC | PRN

## 2024-10-23 MED ORDER — ZOLPIDEM TARTRATE 5 MG PO TABS: 5.0000 mg | ORAL_TABLET | Freq: Every evening | ORAL | Status: DC | PRN

## 2024-10-23 MED ORDER — LACTATED RINGERS IV SOLN: INTRAVENOUS | Status: AC

## 2024-10-23 MED ORDER — COCONUT OIL OIL: 1.0000 | TOPICAL_OIL | Status: DC | PRN

## 2024-10-23 MED ORDER — IBUPROFEN 600 MG PO TABS
600.0000 mg | ORAL_TABLET | Freq: Four times a day (QID) | ORAL | Status: DC
  Administered 2024-10-23 – 2024-10-25 (×9): 600 mg via ORAL
  Filled 2024-10-23 (×9): qty 1

## 2024-10-23 NOTE — Discharge Summary (Signed)
 "    Postpartum Discharge Summary  Date of Service updated    [name] DOB: 05/21/1991 MRN: [id number]  Date of admission: 10/22/2024 Delivery date:10/23/2024 Delivering provider: SYNTHIA RAISIN Date of discharge: 10/25/2024  Admitting diagnosis: Indication for care or intervention in labor or delivery [O75.9] Intrauterine pregnancy: [redacted]w[redacted]d     Secondary diagnosis:  Principal Problem:   Indication for care or intervention in labor or delivery Active Problems:   GBS bacteriuria   Vaginal delivery   Chronic hypertension with superimposed pre-eclampsia  Additional problems: None    Discharge diagnosis: Term Pregnancy Delivered, CHTN with superimposed preeclampsia, and Anemia                                              Post partum procedures:None Augmentation: AROM, Pitocin , Cytotec , and IP Foley Complications: None  Hospital course: Induction of Labor With Vaginal Delivery   34 y.o. yo H3E5884 at [redacted]w[redacted]d was admitted to the hospital 10/22/2024 for induction of labor.  Indication for induction: CHTN with SI PreEclampsia.  Patient received dual cytotec  and foley bulb.  Pitocin  started and patient received epidural before AROM.  She progressed to complete and delivered without issues.  Membrane Rupture Time/Date: 8:07 PM,10/22/2024  Delivery Method:Vaginal, Spontaneous Operative Delivery:N/A Episiotomy: None Lacerations:  None  Details of delivery can be found in separate delivery note.  Patient had a postpartum course complicated by SIPE. Her LFTS were elevated (transaminitis) but not HELLP syndrome. We titrated her blood pressure medication (Nifedipine ) to control her blood pressures. Patient was started on oral iron  therapy for clinically significant but asymptomatic acute post-delivery anemia due to expected blood loss. Patient is discharged home 10/25/24.  Newborn Data: Birth date:10/23/2024 Birth time:3:58 AM Gender:Female-Emery Hazel Living status:Living Apgars:9  ,9  Weight:2990 g  Magnesium  Sulfate received: Yes: Seizure prophylaxis BMZ received: No Rhophylac:N/A MMR:N/A T-DaP:Given prenatally Flu: No RSV Vaccine received: No Transfusion:No  Immunizations received: Immunization History  Administered Date(s) Administered   Influenza Split 07/04/2012   Influenza,inj,Quad PF,6+ Mos 06/10/2016, 06/08/2017, 08/01/2018   Tdap 07/04/2012, 11/25/2013, 06/10/2016, 06/08/2017, 08/06/2024    Physical exam  Vitals:   10/24/24 1623 10/24/24 1937 10/24/24 2359 10/25/24 0448  BP: 134/87 (!) 145/86 (!) 144/85 139/84  Pulse: 95 89 84 84  Resp: 18 18 18 18   Temp: 99 F (37.2 C) (!) 97.5 F (36.4 C)  98 F (36.7 C)  TempSrc: Oral Oral  Oral  SpO2: 98% 100% 94% 98%  Weight:      Height:       General: alert, cooperative, and no distress Lochia: appropriate Uterine Fundus: firm Incision: N/A DVT Evaluation: No evidence of DVT seen on physical exam. Labs: Lab Results  Component Value Date   WBC 9.7 10/24/2024   HGB 9.1 (L) 10/24/2024   HCT 27.9 (L) 10/24/2024   MCV 68.6 (L) 10/24/2024   PLT 306 10/24/2024      Latest Ref Rng & Units 10/24/2024    4:48 AM  CMP  Glucose 70 - 99 mg/dL 879   BUN 6 - 20 mg/dL <5   Creatinine 9.55 - 1.00 mg/dL 9.43   Sodium 864 - 854 mmol/L 136   Potassium 3.5 - 5.1 mmol/L 3.5   Chloride 98 - 111 mmol/L 105   CO2 22 - 32 mmol/L 21   Calcium  8.9 - 10.3 mg/dL 7.3  Total Protein 6.5 - 8.1 g/dL 5.8   Total Bilirubin 0.0 - 1.2 mg/dL <9.7   Alkaline Phos 38 - 126 U/L 108   AST 15 - 41 U/L 68   ALT 0 - 44 U/L 122    Edinburgh Score:    10/23/2024    9:45 AM  Edinburgh Postnatal Depression Scale Screening Tool  I have been able to laugh and see the funny side of things. 0  I have looked forward with enjoyment to things. 0  I have blamed myself unnecessarily when things went wrong. 0  I have been anxious or worried for no good reason. 2  I have felt scared or panicky for no good reason. 0  Things have  been getting on top of me. 0  I have been so unhappy that I have had difficulty sleeping. 0  I have felt sad or miserable. 0  I have been so unhappy that I have been crying. 0  The thought of harming myself has occurred to me. 0  Edinburgh Postnatal Depression Scale Total 2   No data recorded  After visit meds:  Allergies as of 10/25/2024       Reactions   Metronidazole  Nausea And Vomiting   Can take if given Zofran  to take it with.        Medication List     STOP taking these medications    ACCRUFeR  30 MG Caps Generic drug: Ferric Maltol    aspirin  EC 81 MG tablet   Blood Pressure Kit Devi       TAKE these medications    acetaminophen  325 MG tablet Commonly known as: Tylenol  Take 2 tablets (650 mg total) by mouth every 4 (four) hours as needed (for pain scale < 4).   ferrous sulfate  325 (65 FE) MG tablet Take 1 tablet (325 mg total) by mouth every other day. Start taking on: October 26, 2024   furosemide  20 MG tablet Commonly known as: LASIX  Take 1 tablet (20 mg total) by mouth daily.   ibuprofen  600 MG tablet Commonly known as: ADVIL  Take 1 tablet (600 mg total) by mouth every 6 (six) hours.   NIFEdipine  90 MG 24 hr tablet Commonly known as: PROCARDIA  XL/NIFEDICAL-XL Take 1 tablet (90 mg total) by mouth daily. What changed:  medication strength how much to take   oxyCODONE  5 MG immediate release tablet Commonly known as: Oxy IR/ROXICODONE  Take 1 tablet (5 mg total) by mouth every 4 (four) hours as needed (pain scale 4-7).   potassium chloride  SA 20 MEQ tablet Commonly known as: KLOR-CON  M Take 1 tablet (20 mEq total) by mouth daily.   Prenatal Complete 14-0.4 MG Tabs Take 1 tablet by mouth daily.         Discharge home in stable condition Infant Feeding: Bottle and Breast Infant Disposition:home with mother Discharge instruction: per After Visit Summary and Postpartum booklet. Activity: Advance as tolerated. Pelvic rest for 6 weeks.   Diet: routine diet Future Appointments: Future Appointments  Date Time Provider Department Center  10/30/2024 10:40 AM [facility] NURSE [facility] None  12/05/2024 10:35 AM Davis, Devon E, PA-C [facility] None   Follow up Visit:  Follow-up Information     [facility] Follow up in 1 week(s).   Specialty: Obstetrics and Gynecology Contact information: [address] [place]  [address] [phone]               Message Sent 10/23/2024  Please schedule this patient for a In person postpartum visit in 6 weeks with the following provider: Any provider. Additional Postpartum F/U:BP check 1 week  High risk pregnancy complicated by: CHTN on Meds Delivery mode:  Vaginal, Spontaneous Anticipated Birth Control:  IUD   10/25/2024 Vina Solian, MD    "

## 2024-10-23 NOTE — Progress Notes (Signed)
 Patient ID: [name] [name], female   DOB: 14-Jan-1991, 34 y.o.   MRN: [id number] OB/GYN Faculty Practice Delivery Note  [name] [name] is a 34 y.o. H3E6885 s/p vaginal delivery at [redacted]w[redacted]d. She was admitted for induction of labor due to [facility] and preeclamplsia.   AROM: 7h 65m with bloody fluid GBS Status: negative Maximum Maternal Temperature: 98.76F  Labor Progress: Initial SVE: dilation 2 cm, effacement 50%, station -2 Augmentation with AROM, Pitocin , Cytotec , IP catheter Mag and nifedipine  for HTN/preeclampsia IUPC placed Progressed to complete  Delivery Date/Time: 10/23/2024 3:58 AM Delivery: Called to room and patient was complete and pushing. Head delivered LOA. No nuchal cord present. Shoulder and body delivered in usual fashion. Infant with spontaneous cry, placed on mother's abdomen, dried and stimulated. Cord clamped x 2 after 1-minute delay, and cut by mother of patient. Cord blood drawn. Placenta delivered spontaneously with gentle cord traction. Fundus firm with massage and Pitocin . Labia, perineum, vagina, and cervix inspected.   Placenta: Calcifications present Complications: none Lacerations: none EBL: 91 ml Analgesia: epidural with fentanyl   Postpartum Planning [ ]  message to sent to schedule follow-up  [ ]  vaccines UTD  Infant: Living female  APGARs 9,9  2990g  Karl Punch, Medical Student  10/23/2024 4:40 AM

## 2024-10-23 NOTE — Progress Notes (Signed)
 Patient ID: CLORIS FLIPPO, female   DOB: 12/23/1990, 34 y.o.   MRN: [id number] Labor Progress Note FOY MUNGIA is a 34 y.o. H3E6885 at [redacted]w[redacted]d presented for IOL d/t CHTN.  S:  Pt reports feeling pressure in her abdomen alongside contractions. O:  BP (!) 146/61   Pulse 94   Temp 98 F (36.7 C) (Axillary)   Resp (!) 22   Ht 5' 6 (1.676 m)   Wt 123.7 kg   LMP  (LMP Unknown)   SpO2 98%   BMI 44.00 kg/m  EFM: 130 bpm/moderate variability/-Decel, +Accel  CVE: Dilation: 6 Effacement (%): 90 Station: +1, 0 Presentation: Vertex Exam by:: Harlene Duncans CNM   A&P: 34 y.o. H3E6885 [redacted]w[redacted]d  #Labor: Progressing well. IUPC demonstrating adequate contractions #FWB: Cat I FT #Preeclampsia: Receiving mag 2g/hr IV  - Titrate pitocin  as appropriate  - Continue other management as ordered  Karl Punch, Medical Student 3:17 AM

## 2024-10-23 NOTE — Progress Notes (Addendum)
 Patient ID: [name] [name], female   DOB: 07-13-1991, 34 y.o.   MRN: [id number]  Subjective: -Patient resting in bed.  Reports some rectal pressure and body shakes. Mother remains at bedside supportive.   Objective: BP (!) 160/58   Pulse 100   Temp 98 F (36.7 C) (Axillary)   Resp 18   Ht 5' 6 (1.676 m)   Wt 123.7 kg   LMP  (LMP Unknown)   SpO2 100%   BMI 44.00 kg/m  No intake/output data recorded. Total I/O In: 4179.2 [I.V.:3471.4; Other:507.8; IV Piggyback:200] Out: 825 [Urine:825]  Fetal Monitoring: FHT: 120 bpm, Mod Var, -Decels, +Accels UC: Irritability graphed    Vaginal Exam: SVE:   Dilation: 5 Effacement (%): 50 Station: -3, -2 Exam by:: Tosh Glaze CNM Membranes:AROM x 4 hrs Internal Monitors: IUPC inserted  Augmentation/Induction: Pitocin :35mUn/min Cytotec : S/P  Assessment:  IUP at 38.2 weeks Cat I FT  IOL Magnesium  sulfate Infusion  Plan: -Discussed IUPC r/b, prior to insertion, including increased risk of infection and ability to adequately monitor quantity and strength of contractions. -Patient agreeable.  -IUPC inserted without difficulty. -Titrate pitocin  as appropriate.  -Continue other mgmt as ordered.   [name] [name], CNM Advanced Practice Provider, Center for [facility] 10/23/2024, 12:45 AM

## 2024-10-23 NOTE — Anesthesia Postprocedure Evaluation (Signed)
"   Anesthesia Post Note  Patient: [name] [name]  Procedure(s) Performed: AN AD HOC LABOR EPIDURAL     Patient location during evaluation: Mother Baby Anesthesia Type: Epidural Level of consciousness: awake and alert Pain management: pain level controlled Vital Signs Assessment: post-procedure vital signs reviewed and stable Respiratory status: spontaneous breathing, nonlabored ventilation and respiratory function stable Cardiovascular status: stable Postop Assessment: no headache, no backache and epidural receding Anesthetic complications: no   No notable events documented.  Last Vitals:  Vitals:   10/23/24 0603 10/23/24 0720  BP: (!) 143/89 (!) 140/77  Pulse: (!) 101 97  Resp: 18 18  Temp: 36.8 C   SpO2: 100% 100%    Last Pain:  Vitals:   10/23/24 0730  TempSrc:   PainSc: 5    Pain Goal:                Epidural/Spinal Function Cutaneous sensation: Normal sensation (10/23/24 0730), Patient able to flex knees: Yes (10/23/24 0730), Patient able to lift hips off bed: Yes (10/23/24 0730), Back pain beyond tenderness at insertion site: No (10/23/24 0730), Progressively worsening motor and/or sensory loss: No (10/23/24 0730), Bowel and/or bladder incontinence post epidural: No (10/23/24 0730)  [name]      "

## 2024-10-24 LAB — COMPREHENSIVE METABOLIC PANEL WITH GFR
ALT: 122 U/L — ABNORMAL HIGH (ref 0–44)
AST: 68 U/L — ABNORMAL HIGH (ref 15–41)
Albumin: 2.8 g/dL — ABNORMAL LOW (ref 3.5–5.0)
Alkaline Phosphatase: 108 U/L (ref 38–126)
Anion gap: 11 (ref 5–15)
BUN: <5 mg/dL — ABNORMAL LOW (ref 6–20)
CO2: 21 mmol/L — ABNORMAL LOW (ref 22–32)
Calcium: 7.3 mg/dL — ABNORMAL LOW (ref 8.9–10.3)
Chloride: 105 mmol/L (ref 98–111)
Creatinine, Ser: 0.56 mg/dL (ref 0.44–1.00)
GFR, Estimated: >60 mL/min
Glucose, Bld: 120 mg/dL — ABNORMAL HIGH (ref 70–99)
Potassium: 3.5 mmol/L (ref 3.5–5.1)
Sodium: 136 mmol/L (ref 135–145)
Total Bilirubin: <0.2 mg/dL (ref 0.0–1.2)
Total Protein: 5.8 g/dL — ABNORMAL LOW (ref 6.5–8.1)

## 2024-10-24 LAB — CBC
HCT: 27.9 % — ABNORMAL LOW (ref 36.0–46.0)
Hemoglobin: 9.1 g/dL — ABNORMAL LOW (ref 12.0–15.0)
MCH: 22.4 pg — ABNORMAL LOW (ref 26.0–34.0)
MCHC: 32.6 g/dL (ref 30.0–36.0)
MCV: 68.6 fL — ABNORMAL LOW (ref 80.0–100.0)
Platelets: 306 K/uL (ref 150–400)
RBC: 4.07 MIL/uL (ref 3.87–5.11)
RDW: 15.1 % (ref 11.5–15.5)
WBC: 9.7 K/uL (ref 4.0–10.5)
nRBC: 0 % (ref 0.0–0.2)

## 2024-10-24 LAB — BIRTH TISSUE RECOVERY COLLECTION (PLACENTA DONATION)

## 2024-10-24 MED ORDER — FERROUS SULFATE 325 (65 FE) MG PO TABS
325.0000 mg | ORAL_TABLET | ORAL | Status: DC
  Administered 2024-10-24: 325 mg via ORAL
  Filled 2024-10-24: qty 1

## 2024-10-24 NOTE — Plan of Care (Signed)
" °  Problem: Education: Goal: Knowledge of risk factors and measures for prevention of condition will improve Outcome: Progressing   Problem: Coping: Goal: Psychosocial and spiritual needs will be supported Outcome: Progressing   Problem: Health Behavior/Discharge Planning: Goal: Ability to manage health-related needs will improve Outcome: Progressing   Problem: Clinical Measurements: Goal: Ability to maintain clinical measurements within normal limits will improve Outcome: Progressing Goal: Will remain free from infection Outcome: Progressing   Problem: Activity: Goal: Risk for activity intolerance will decrease Outcome: Progressing   Problem: Nutrition: Goal: Adequate nutrition will be maintained Outcome: Progressing   Problem: Pain Managment: Goal: General experience of comfort will improve and/or be controlled Outcome: Progressing   Problem: Role Relationship: Goal: Will demonstrate positive interactions with the child Outcome: Progressing   Problem: Life Cycle: Goal: Chance of risk for complications during the postpartum period will decrease Outcome: Progressing   "

## 2024-10-24 NOTE — Plan of Care (Signed)
" °  Problem: Education: Goal: Knowledge of risk factors and measures for prevention of condition will improve Outcome: Progressing   Problem: Coping: Goal: Psychosocial and spiritual needs will be supported Outcome: Progressing   Problem: Respiratory: Goal: Will maintain a patent airway Outcome: Progressing Goal: Complications related to the disease process, condition or treatment will be avoided or minimized Outcome: Progressing   Problem: Education: Goal: Knowledge of General Education information will improve Description: Including pain rating scale, medication(s)/side effects and non-pharmacologic comfort measures Outcome: Progressing   Problem: Health Behavior/Discharge Planning: Goal: Ability to manage health-related needs will improve Outcome: Progressing   Problem: Clinical Measurements: Goal: Ability to maintain clinical measurements within normal limits will improve Outcome: Progressing Goal: Will remain free from infection Outcome: Progressing Goal: Diagnostic test results will improve Outcome: Progressing Goal: Respiratory complications will improve Outcome: Progressing Goal: Cardiovascular complication will be avoided Outcome: Progressing   Problem: Activity: Goal: Risk for activity intolerance will decrease Outcome: Progressing   Problem: Nutrition: Goal: Adequate nutrition will be maintained Outcome: Progressing   Problem: Coping: Goal: Level of anxiety will decrease Outcome: Progressing   Problem: Elimination: Goal: Will not experience complications related to bowel motility Outcome: Progressing Goal: Will not experience complications related to urinary retention Outcome: Progressing   Problem: Pain Managment: Goal: General experience of comfort will improve and/or be controlled Outcome: Progressing   Problem: Safety: Goal: Ability to remain free from injury will improve Outcome: Progressing   Problem: Skin Integrity: Goal: Risk for impaired  skin integrity will decrease Outcome: Progressing   Problem: Education: Goal: Knowledge of Childbirth will improve Outcome: Progressing Goal: Ability to make informed decisions regarding treatment and plan of care will improve Outcome: Progressing Goal: Ability to state and carry out methods to decrease the pain will improve Outcome: Progressing Goal: Individualized Educational Video(s) Outcome: Progressing   Problem: Coping: Goal: Ability to verbalize concerns and feelings about labor and delivery will improve Outcome: Progressing   Problem: Life Cycle: Goal: Ability to make normal progression through stages of labor will improve Outcome: Progressing Goal: Ability to effectively push during vaginal delivery will improve Outcome: Progressing   Problem: Role Relationship: Goal: Will demonstrate positive interactions with the child Outcome: Progressing   Problem: Safety: Goal: Risk of complications during labor and delivery will decrease Outcome: Progressing   Problem: Pain Management: Goal: Relief or control of pain from uterine contractions will improve Outcome: Progressing   Problem: Education: Goal: Knowledge of disease or condition will improve Outcome: Progressing Goal: Knowledge of the prescribed therapeutic regimen will improve Outcome: Progressing   Problem: Fluid Volume: Goal: Peripheral tissue perfusion will improve Outcome: Progressing   Problem: Clinical Measurements: Goal: Complications related to disease process, condition or treatment will be avoided or minimized Outcome: Progressing   Problem: Education: Goal: Knowledge of condition will improve Outcome: Progressing Goal: Individualized Educational Video(s) Outcome: Progressing Goal: Individualized Newborn Educational Video(s) Outcome: Progressing   Problem: Activity: Goal: Will verbalize the importance of balancing activity with adequate rest periods Outcome: Progressing Goal: Ability to  tolerate increased activity will improve Outcome: Progressing   Problem: Coping: Goal: Ability to identify and utilize available resources and services will improve Outcome: Progressing   Problem: Life Cycle: Goal: Chance of risk for complications during the postpartum period will decrease Outcome: Progressing   Problem: Role Relationship: Goal: Ability to demonstrate positive interaction with newborn will improve Outcome: Progressing   Problem: Skin Integrity: Goal: Demonstration of wound healing without infection will improve Outcome: Progressing   "

## 2024-10-24 NOTE — Progress Notes (Signed)
 Post Partum Day 1 s/p SVD - CHTN w/SIPE with SF by LFTs Subjective: no complaints, up ad lib, voiding, and tolerating PO. She had a HA yesterday but resolved today. Feeling better off the Magnesium .   Objective: Blood pressure 138/77, pulse 84, temperature 97.6 F (36.4 C), temperature source Oral, resp. rate 18, height 5' 6 (1.676 m), weight 123.7 kg, SpO2 99%, unknown if currently breastfeeding.  Physical Exam:  General: alert, cooperative, and no distress Lochia: appropriate Uterine Fundus: firm Incision: NA DVT Evaluation: No evidence of DVT seen on physical exam.  Recent Labs    10/23/24 0447 10/24/24 0448  HGB 10.7* 9.1*  HCT 33.8* 27.9*    Assessment/Plan: Postpartum - Contraception: outpt IUD - MOF: Breast - Rh status: Rh pos - Rubella status: Immune - Dispo: PPD2 - Consults: None  Anemia - Anemia pre-existing with Hgb 10.7 prior to delivery. In setting of abruption, hgb of 9.1 is appropriate. Will do PO iron . Patient was started on oral iron  therapy for clinically significant but asymptomatic anemia due to blood loss (again from the abruption).  CHTN with SIPE by LFTs - LFTs improving. Will not continue to follow inpt since improving. Can recheck as an outpt.  - Increased Nifed this morning to 60 daily and discussed with pt.  - Continue lasix  and potassium.   Neonatal - In NICU   LOS: 2 days   Vina Solian 10/24/2024, 7:55 AM

## 2024-10-24 NOTE — Social Work (Signed)
 CSW received consult for anxiety. CSW met with [name] to offer support and complete assessment.    CSW met with [name] at bedside and introduced CSW role. [name] appeared calm, pleasant and engaged during the visit. CSW asked [name] how she had been doing. [name] shared overall she has been doing good, though she reported feeling tired and stated that had not been able to sleep. She reported a having a history of insomnia. [name] shared that her pregnancy was emotionally challenging and expressed some nervousness about caring for a newborn, as this is her first child in over seven years. She reported that she anticipates being the infant's primary parent; however, she identified her maternal grandmother, her friends, and her second oldest daughter paternal grandmother as her primary sources of support. CSW inquired about [name]'s noted history of anxiety. [name] acknowledged that she has a history of anxiety and stated that she has learned to manage her symptoms by using coping strategies such as staying engaged caring for her children and focusing on their needs, which finds rewarding.CSW validated [name]'s coping strategies and offered additional ways to cope. CSW inquired if [name] had previously experienced postpartum depression and anxiety symptoms. [name] reported that she experienced postpartum depression and anxiety after the birth of her oldest daughter and after the birth of her son as evidenced by her feeling elevated anxiety and ruminating thoughts during this time. CSW acknowledged [name]'s experience and provided education regarding the baby blues period vs. perinatal mood disorders, discussed treatment and gave resources for mental health follow up if concerns arise. CSW recommended [name] complete a self-evaluation during the postpartum time period using the New Mom Checklist from Postpartum Progress and encouraged [name] to contact a medical professional if symptoms are noted at any time. CSW assessed [name] for safety. [name] denied SI/HI and  DV concerns.     CSW inquired if [name] had all essential items to care for her infant. [name] reported that she had all essential items to care for her infant including a car seat and crib. CSW provided review of Sudden Infant Death Syndrome (SIDS) precautions. [name] verbalized understanding. CSW discussed [facility] for community support. [name] expressed interest in the program and gave CSW permission to make the referral. [name] reported that she has chosen a predication at [facility].   CSW inquired about SDOH-Housing insecurity. [name] denied current housing concerns and stated that she and her children moved into a new in September 2025.   CSW identifies no further need for intervention and no barriers to discharge at this time.  Nat Quiet, MSW, LCSW Clinical Social Worker  [phone] 10/24/2024  11:55 AM

## 2024-10-25 ENCOUNTER — Other Ambulatory Visit (HOSPITAL_COMMUNITY): Payer: Self-pay

## 2024-10-25 MED ORDER — IBUPROFEN 600 MG PO TABS
600.0000 mg | ORAL_TABLET | Freq: Four times a day (QID) | ORAL | 0 refills | Status: AC
  Filled 2024-10-25: qty 30, 8d supply, fill #0

## 2024-10-25 MED ORDER — OXYCODONE HCL 5 MG PO TABS
5.0000 mg | ORAL_TABLET | ORAL | 0 refills | Status: AC | PRN
  Filled 2024-10-25 (×2): qty 10, 2d supply, fill #0

## 2024-10-25 MED ORDER — IBUPROFEN 600 MG PO TABS: 600.0000 mg | ORAL_TABLET | Freq: Four times a day (QID) | ORAL | 0 refills | Status: DC

## 2024-10-25 MED ORDER — NIFEDIPINE ER OSMOTIC RELEASE 60 MG PO TB24
90.0000 mg | ORAL_TABLET | Freq: Every day | ORAL | Status: DC
  Administered 2024-10-25: 90 mg via ORAL
  Filled 2024-10-25: qty 1

## 2024-10-25 MED ORDER — OXYCODONE HCL 5 MG PO TABS: 5.0000 mg | ORAL_TABLET | ORAL | 0 refills | Status: DC | PRN

## 2024-10-25 MED ORDER — FERROUS SULFATE 325 (65 FE) MG PO TABS: 325.0000 mg | ORAL_TABLET | ORAL | 0 refills | Status: DC

## 2024-10-25 MED ORDER — POTASSIUM CHLORIDE CRYS ER 20 MEQ PO TBCR
20.0000 meq | EXTENDED_RELEASE_TABLET | Freq: Every day | ORAL | 0 refills | Status: AC
  Filled 2024-10-25 (×2): qty 5, 5d supply, fill #0

## 2024-10-25 MED ORDER — FUROSEMIDE 20 MG PO TABS
20.0000 mg | ORAL_TABLET | Freq: Every day | ORAL | 0 refills | Status: AC
  Filled 2024-10-25 (×2): qty 5, 5d supply, fill #0

## 2024-10-25 MED ORDER — ACETAMINOPHEN 325 MG PO TABS: 650.0000 mg | ORAL_TABLET | ORAL | 0 refills | Status: DC | PRN

## 2024-10-25 MED ORDER — NIFEDIPINE ER OSMOTIC RELEASE 90 MG PO TB24: 90.0000 mg | ORAL_TABLET | Freq: Every day | ORAL | 0 refills | Status: DC

## 2024-10-25 MED ORDER — POTASSIUM CHLORIDE CRYS ER 20 MEQ PO TBCR: 20.0000 meq | EXTENDED_RELEASE_TABLET | Freq: Every day | ORAL | 0 refills | Status: DC

## 2024-10-25 MED ORDER — NIFEDIPINE ER 90 MG PO TB24
90.0000 mg | ORAL_TABLET | Freq: Every day | ORAL | 0 refills | Status: DC
  Filled 2024-10-25 (×2): qty 30, 30d supply, fill #0

## 2024-10-25 MED ORDER — FUROSEMIDE 20 MG PO TABS: 20.0000 mg | ORAL_TABLET | Freq: Every day | ORAL | 0 refills | Status: DC

## 2024-10-25 MED ORDER — FERROUS SULFATE 325 (65 FE) MG PO TABS
325.0000 mg | ORAL_TABLET | ORAL | 0 refills | Status: AC
  Filled 2024-10-25: qty 30, 60d supply, fill #0

## 2024-10-25 MED ORDER — ACETAMINOPHEN 325 MG PO TABS
650.0000 mg | ORAL_TABLET | ORAL | 0 refills | Status: AC | PRN
  Filled 2024-10-25: qty 30, 3d supply, fill #0

## 2024-10-25 NOTE — Progress Notes (Signed)
Discharge instructions and prescriptions given to pt. Discussed post vaginal delivery care, signs and symptoms to report to the MD, upcoming appointments, and meds.Pt verbalizes understanding and has no questions or concerns at this time. Pt discharged home from hospital in stable condition. 

## 2024-10-25 NOTE — Consult Note (Signed)
" ° °  Outpatient Lactation Booking Note  Visited parent per provider to establish outpatient lactation care. Mom stated she is doing well, baby has not been latch very well and she is supplementing with formula. Is interested in extra lactation support.  OP LC congratulated on birth, reviewed OP lactation support options and details, such as support group, phone calls, virtual/telehealth, and in person lactation consultations. Encouraged to please call anytime with questions or concerns.   Parent accepted outpatient lactation care.  [name] [name], [facility] Center for [facility] "

## 2024-10-25 NOTE — Lactation Note (Signed)
 This note was copied from a baby's chart. Lactation Consultation Note  [name] Unijb'd Date: 10/25/2024 Age:32 hours Reason for consult: Follow-up assessment;Early term 37-38.6wks;Other (Comment);MD order (Pre-E, on Mag, COVID (+) on 10/13/2024)  Visited with family of 47 50/27 weeks old female [name]; Ms. Raysor is a P5 and reported she's been pumping consistently (although she can't remember how often) and she has also taken baby to breast. Family has also been supplementing with Enfamil 20 calorie formula per feeding choice on admission. Provider called out for lactation because she was having breast pain. No S/S of engorgement at this time but nipples were a bit dry. Reviewed prevention/treatment for sore nipples, unable to get colostrum at this time, asked her [facility] RN to provide with coconut oil and instructed her to use it every time prior to pumping.  Couplet is getting discharged today. Reviewed discharge education and the importance of consistent nipple stimulation (baby's mouth, pumping) for the onset of secretory activation and the prevention of engorgement. Referral to [facility] sent for [facility] OP F/U. Encouraged +8 feedings/24 hours or sooner if feeding cues are present and to pump whenever baby is getting a bottle. No other support person at this time. All questions and concerns answered, family to contact [facility] services PRN.  Feeding Mother's Current Feeding Choice: Breast Milk and Formula Nipple Type: Nfant Extra Slow Flow (gold)  Lactation Tools Discussed/Used Tools: Pump;Flanges;Hands-free pumping top;Coconut oil Flange Size: 21;18 Breast pump type: Double-Electric Breast Pump Pump Education: Setup, frequency, and cleaning;Milk Storage Reason for Pumping: ETI, support milk supply Pumping frequency: unsure of frequency, but she voiced it's been consistent Pumped volume:  (drops)  Interventions Interventions: Breast feeding basics reviewed;Coconut  oil;DEBP;Education  Discharge Discharge Education: Engorgement and breast care;Warning signs for feeding baby;Outpatient recommendation;Outpatient Epic message sent Pump: Personal;DEBP (Motif Luna through insurance)  Consult Status Consult Status: Complete Date: 10/25/24 Follow-up type: Call as needed   Jamarrius Salay S Jayren Cease 10/25/2024, 10:00 AM

## 2024-10-25 NOTE — Progress Notes (Signed)
 Baby Scripts order in, patient given a BP cuff and explained how to use it. Patient informed to check email for Babyscripts and download app. Patient given the phone number to [facility] office in [place] to call per Social work, SW also said they would enter a referral.

## 2024-10-30 ENCOUNTER — Other Ambulatory Visit: Payer: Self-pay

## 2024-10-30 ENCOUNTER — Inpatient Hospital Stay (HOSPITAL_COMMUNITY)

## 2024-10-30 ENCOUNTER — Ambulatory Visit: Payer: Self-pay

## 2024-10-30 ENCOUNTER — Inpatient Hospital Stay (HOSPITAL_COMMUNITY)
Admission: AD | Admit: 2024-10-30 | Discharge: 2024-10-30 | Disposition: A | Discharge status: Home / Self Care | Attending: Obstetrics and Gynecology | Admitting: Obstetrics and Gynecology

## 2024-10-30 VITALS — BP 153/110 | HR 85 | Ht 66.0 in | Wt 258.9 lb

## 2024-10-30 DIAGNOSIS — O165 Unspecified maternal hypertension, complicating the puerperium: Secondary | ICD-10-CM | POA: Diagnosis not present

## 2024-10-30 DIAGNOSIS — O115 Pre-existing hypertension with pre-eclampsia, complicating the puerperium: Secondary | ICD-10-CM

## 2024-10-30 DIAGNOSIS — R519 Headache, unspecified: Secondary | ICD-10-CM | POA: Insufficient documentation

## 2024-10-30 DIAGNOSIS — O119 Pre-existing hypertension with pre-eclampsia, unspecified trimester: Secondary | ICD-10-CM

## 2024-10-30 LAB — COMPREHENSIVE METABOLIC PANEL WITH GFR
ALT: 53 U/L — ABNORMAL HIGH (ref 0–44)
AST: 21 U/L (ref 15–41)
Albumin: 3.5 g/dL (ref 3.5–5.0)
Alkaline Phosphatase: 85 U/L (ref 38–126)
Anion gap: 10 (ref 5–15)
BUN: 8 mg/dL (ref 6–20)
CO2: 23 mmol/L (ref 22–32)
Calcium: 8.4 mg/dL — ABNORMAL LOW (ref 8.9–10.3)
Chloride: 108 mmol/L (ref 98–111)
Creatinine, Ser: 0.62 mg/dL (ref 0.44–1.00)
GFR, Estimated: >60 mL/min
Glucose, Bld: 81 mg/dL (ref 70–99)
Potassium: 3.8 mmol/L (ref 3.5–5.1)
Sodium: 141 mmol/L (ref 135–145)
Total Bilirubin: 0.2 mg/dL (ref 0.0–1.2)
Total Protein: 7 g/dL (ref 6.5–8.1)

## 2024-10-30 LAB — CBC
HCT: 33.9 % — ABNORMAL LOW (ref 36.0–46.0)
Hemoglobin: 10.5 g/dL — ABNORMAL LOW (ref 12.0–15.0)
MCH: 21.6 pg — ABNORMAL LOW (ref 26.0–34.0)
MCHC: 31 g/dL (ref 30.0–36.0)
MCV: 69.8 fL — ABNORMAL LOW (ref 80.0–100.0)
Platelets: 417 10*3/uL — ABNORMAL HIGH (ref 150–400)
RBC: 4.86 MIL/uL (ref 3.87–5.11)
RDW: 15.8 % — ABNORMAL HIGH (ref 11.5–15.5)
WBC: 7.7 10*3/uL (ref 4.0–10.5)
nRBC: 0 % (ref 0.0–0.2)

## 2024-10-30 MED ORDER — NIFEDIPINE ER OSMOTIC RELEASE 30 MG PO TB24: 60.0000 mg | ORAL_TABLET | Freq: Two times a day (BID) | ORAL | 0 refills | Status: AC

## 2024-10-30 MED ORDER — IBUPROFEN 600 MG PO TABS
600.0000 mg | ORAL_TABLET | Freq: Once | ORAL | Status: AC
  Administered 2024-10-30: 600 mg via ORAL
  Filled 2024-10-30: qty 1

## 2024-10-30 MED ORDER — OXYCODONE HCL 5 MG PO CAPS: 10.0000 mg | ORAL_CAPSULE | Freq: Four times a day (QID) | ORAL | 0 refills | Status: AC | PRN

## 2024-10-30 MED ORDER — SUMATRIPTAN SUCCINATE 50 MG PO TABS: 50.0000 mg | ORAL_TABLET | Freq: Once | ORAL | Status: DC

## 2024-10-30 MED ORDER — LABETALOL HCL 100 MG PO TABS
200.0000 mg | ORAL_TABLET | Freq: Once | ORAL | Status: AC
  Administered 2024-10-30: 200 mg via ORAL
  Filled 2024-10-30: qty 2

## 2024-10-30 MED ORDER — OXYCODONE HCL 5 MG PO TABS: 5.0000 mg | ORAL_TABLET | ORAL | Status: DC | PRN

## 2024-10-30 MED ORDER — LABETALOL HCL 200 MG PO TABS: 200.0000 mg | ORAL_TABLET | Freq: Two times a day (BID) | ORAL | 0 refills | Status: AC

## 2024-10-30 MED ORDER — NIFEDIPINE ER OSMOTIC RELEASE 30 MG PO TB24
90.0000 mg | ORAL_TABLET | Freq: Once | ORAL | Status: AC
  Administered 2024-10-30: 90 mg via ORAL
  Filled 2024-10-30: qty 3

## 2024-10-30 MED ORDER — NIFEDIPINE ER OSMOTIC RELEASE 30 MG PO TB24: 30.0000 mg | ORAL_TABLET | Freq: Two times a day (BID) | ORAL | Status: DC

## 2024-10-30 MED ORDER — HYDROCHLOROTHIAZIDE 25 MG PO TABS
25.0000 mg | ORAL_TABLET | Freq: Once | ORAL | Status: AC
  Administered 2024-10-30: 25 mg via ORAL
  Filled 2024-10-30: qty 1

## 2024-10-30 MED ORDER — ACETAMINOPHEN-CAFFEINE 500-65 MG PO TABS
2.0000 | ORAL_TABLET | Freq: Once | ORAL | Status: AC
  Administered 2024-10-30: 2 via ORAL
  Filled 2024-10-30: qty 2

## 2024-10-30 MED ORDER — SUMATRIPTAN SUCCINATE 100 MG PO TABS
100.0000 mg | ORAL_TABLET | Freq: Once | ORAL | Status: AC
  Administered 2024-10-30: 100 mg via ORAL
  Filled 2024-10-30: qty 1

## 2024-10-30 MED ORDER — METOCLOPRAMIDE HCL 10 MG PO TABS
10.0000 mg | ORAL_TABLET | Freq: Once | ORAL | Status: AC
  Administered 2024-10-30: 10 mg via ORAL
  Filled 2024-10-30: qty 1

## 2024-10-30 MED ORDER — NIFEDIPINE ER OSMOTIC RELEASE 30 MG PO TB24
30.0000 mg | ORAL_TABLET | Freq: Once | ORAL | Status: AC
  Administered 2024-10-30: 30 mg via ORAL
  Filled 2024-10-30: qty 1

## 2024-10-30 NOTE — MAU Note (Addendum)
 [name] [name] is a 34 y.o. at Unknown here in MAU reporting: PP by 1 day sent from office with HA currently, blurred vision, HBP in office, RUQ pain, back pain and hives on bilateral hands No meds taken this am - HBP or HA Pt is breastfeeding No worrisome bleeding reported LMP:na Onset of complaint: Monday Pain score: 7/10 HA 8/10 Back Vitals:   10/30/24 1232  BP: (!) 163/107  Pulse: 81  Resp: 17  Temp: 98.8 F (37.1 C)  SpO2: 100%     FHT: na  Lab orders placed from triage:

## 2024-10-30 NOTE — MAU Provider Note (Signed)
 Chief Complaint:  Hypertension, Headache, and Blurred Vision   HPI   Event Date/Time   First Provider Initiated Contact with Patient 10/30/24 1306      [name] [name] is a 34 y.o. [id number] at Unknown who presents to maternity admissions reporting she is 1 week postpartum NSVD on 10/23/2024.  Patient was an induction of labor for chronic hypertension with superimposed preeclampsia and was discharged home on antihypertensive medication as well as Lasix  and potassium.  Patient did receive 24 hours of magnesium  for seizure prophylaxis  Patient presents today complaining of a chronic headache for the past 48 hours unresolved with  medication.  She states she has been taking Motrin  and Tylenol  around-the-clock as well as oxycodone .  She reports high blood pressure in the office 153/110 with an unresolved headache with occasional palpitations.  Patient states that she did not take her Procardia  90 XL today.  On presentation to the MAU her BP was 163/107 with a headache of 7 out of 10 on pain scale.  She was also reporting some palpitations but denies shortness of breath, right upper quadrant pain, or chest pain.  Pregnancy Course: [name] [name]  Past Medical History:  Diagnosis Date   Anemia    Anxiety    Chlamydia 10/2011   Cholecystitis    Closed head injury with concussion    Gestational hypertension    Headache(784.0)    Heart palpitations    History of pre-eclampsia in prior pregnancy, currently pregnant    Hypertension    Right medial tibial plateau fracture 11/24/2021   OB History  Gravida Para Term Preterm AB Living  6 5 4 1 1 5   SAB IAB Ectopic Multiple Live Births  0 1 0 0 5    # Outcome Date GA Lbr Len/2nd Weight Sex Type Anes PTL Lv  6 Term 10/23/24 [redacted]w[redacted]d / 00:21 2990 g F Vag-Spont EPI  LIV  5 Preterm 08/12/17 [redacted]w[redacted]d 02:02 / 00:09 2255 g [name] [name] EPI  LIV  4 Term 08/24/16 [redacted]w[redacted]d 05:54 / 00:21 2509 g F Vag-Spont EPI  LIV  3 Term 12/14/13 [redacted]w[redacted]d 10:03 / 00:08 2850 g F Vag-Spont EPI   LIV  2 Term 07/03/12 [redacted]w[redacted]d 33:31 / 00:09 2469 g F Vag-Spont EPI  LIV  1 IAB            Past Surgical History:  Procedure Laterality Date   ORIF ANKLE FRACTURE Left 02/12/2016   Procedure: OPEN REDUCTION INTERNAL FIXATION (ORIF) ANKLE FRACTURE;  Surgeon: Ozell Bruch, MD;  Location: [facility] OR;  Service: Orthopedics;  Laterality: Left;   ORIF TIBIA PLATEAU Right 11/26/2021   Procedure: OPEN REDUCTION INTERNAL FIXATION (ORIF) TIBIAL PLATEAU;  Surgeon: Kendal Franky SQUIBB, MD;  Location: [facility] OR;  Service: Orthopedics;  Laterality: Right;   TONSILLECTOMY Bilateral 11/20/2023   Procedure: TONSILLECTOMY;  Surgeon: Jesus Oliphant, MD;  Location: [facility];  Service: ENT;  Laterality: Bilateral;   Family History  Problem Relation Age of Onset   Diabetes Mother    Hypertension Mother    Migraines Father    Diabetes Maternal Grandmother    Hypertension Maternal Grandmother    Cancer Maternal Grandmother    Anesthesia problems Neg Hx    Other Neg Hx    Social History[1] Allergies[2] Medications Prior to Admission  Medication Sig Dispense Refill Last Dose/Taking   acetaminophen  (TYLENOL ) 325 MG tablet Take 2 tablets (650 mg total) by mouth every 4 (four) hours as needed (for pain scale < 4). 30 tablet 0  10/29/2024   ferrous sulfate  325 (65 FE) MG tablet Take 1 tablet (325 mg total) by mouth every other day. 30 tablet 0 10/29/2024   furosemide  (LASIX ) 20 MG tablet Take 1 tablet (20 mg total) by mouth daily. 5 tablet 0 10/29/2024   ibuprofen  (ADVIL ) 600 MG tablet Take 1 tablet (600 mg total) by mouth every 6 (six) hours. 30 tablet 0 10/29/2024   NIFEdipine  (ADALAT  CC) 90 MG 24 hr tablet Take 1 tablet (90 mg total) by mouth daily. 30 tablet 0 10/29/2024   oxyCODONE  (OXY IR/ROXICODONE ) 5 MG immediate release tablet Take 1 tablet (5 mg total) by mouth every 4 (four) hours as needed (pain scale 4-7). 10 tablet 0 Past Week   potassium chloride  SA (KLOR-CON  M) 20 MEQ tablet Take 1 tablet (20 mEq total) by  mouth daily. 5 tablet 0 10/29/2024   Prenatal Vit-Fe Fumarate-FA (PRENATAL COMPLETE) 14-0.4 MG TABS Take 1 tablet by mouth daily. 60 tablet 3 10/29/2024    I have reviewed patient's Past Medical Hx, Surgical Hx, Family Hx, Social Hx, medications and allergies.   ROS  Pertinent items noted in HPI and remainder of comprehensive ROS otherwise negative.   PHYSICAL EXAM  Patient Vitals for the past 24 hrs:  BP Temp Temp src Pulse Resp SpO2 Weight  10/30/24 1920 (!) 156/106 -- -- 84 17 -- --  10/30/24 1832 (!) 159/108 -- -- 80 -- -- --  10/30/24 1817 (!) 152/99 -- -- 79 -- -- --  10/30/24 1802 (!) 167/100 -- -- 88 -- -- --  10/30/24 1756 (!) 151/100 -- -- 87 -- -- --  10/30/24 1746 (!) 151/100 -- -- 87 -- -- --  10/30/24 1732 135/86 -- -- 82 -- -- --  10/30/24 1717 (!) 167/103 -- -- 92 -- -- --  10/30/24 1702 (!) 163/104 -- -- 89 -- -- --  10/30/24 1647 (!) 160/88 -- -- 80 -- -- --  10/30/24 1632 (!) 156/93 -- -- 83 -- -- --  10/30/24 1617 (!) 156/95 -- -- 89 -- -- --  10/30/24 1606 (!) 139/92 -- -- 85 -- -- --  10/30/24 1554 (!) 159/103 -- -- 86 -- -- --  10/30/24 1541 (!) 171/105 -- -- 93 -- -- --  10/30/24 1532 (!) 173/105 -- -- 82 -- -- --  10/30/24 1531 (!) 173/105 -- -- 82 -- -- --  10/30/24 1515 (!) 145/78 -- -- 79 -- -- --  10/30/24 1500 (!) 159/88 -- -- 80 -- -- --  10/30/24 1445 (!) 161/92 -- -- 81 -- -- --  10/30/24 1434 138/72 -- -- 76 -- -- --  10/30/24 1415 (!) 168/86 -- -- 73 -- -- --  10/30/24 1400 (!) 154/93 -- -- 83 -- -- --  10/30/24 1338 (!) 157/103 -- -- 81 18 -- --  10/30/24 1315 (!) 161/95 -- -- 82 -- -- --  10/30/24 1251 (!) 147/83 -- -- 83 -- -- --  10/30/24 1232 (!) 163/107 98.8 F (37.1 C) Oral 81 17 100 % 118 kg    Constitutional: Well-developed, obese female in no acute distress.  Cardiovascular: normal rate & rhythm, warm and well-perfused Respiratory: normal effort, no problems with respiration noted GI: Abd soft, non-tender, gravid, no ruq  MS:  Extremities nontender, no edema, normal ROM Neurologic: Alert and oriented x 4.      Labs: Results for orders placed or performed during the hospital encounter of 10/30/24 (from the past 24 hours)  CBC  Status: Abnormal   Collection Time: 10/30/24 12:48 PM  Result Value Ref Range   WBC 7.7 4.0 - 10.5 K/uL   RBC 4.86 3.87 - 5.11 MIL/uL   Hemoglobin 10.5 (L) 12.0 - 15.0 g/dL   HCT 66.0 (L) 63.9 - 53.9 %   MCV 69.8 (L) 80.0 - 100.0 fL   MCH 21.6 (L) 26.0 - 34.0 pg   MCHC 31.0 30.0 - 36.0 g/dL   RDW 84.1 (H) 88.4 - 84.4 %   Platelets 417 (H) 150 - 400 K/uL   nRBC 0.0 0.0 - 0.2 %  Comprehensive metabolic panel with GFR     Status: Abnormal   Collection Time: 10/30/24 12:48 PM  Result Value Ref Range   Sodium 141 135 - 145 mmol/L   Potassium 3.8 3.5 - 5.1 mmol/L   Chloride 108 98 - 111 mmol/L   CO2 23 22 - 32 mmol/L   Glucose, Bld 81 70 - 99 mg/dL   BUN 8 6 - 20 mg/dL   Creatinine, Ser 9.37 0.44 - 1.00 mg/dL   Calcium  8.4 (L) 8.9 - 10.3 mg/dL   Total Protein 7.0 6.5 - 8.1 g/dL   Albumin 3.5 3.5 - 5.0 g/dL   AST 21 15 - 41 U/L   ALT 53 (H) 0 - 44 U/L   Alkaline Phosphatase 85 38 - 126 U/L   Total Bilirubin 0.2 0.0 - 1.2 mg/dL   GFR, Estimated >39 >39 mL/min   Anion gap 10 5 - 15      MDM & MAU COURSE  FIF:Dulib Result  Narrative & Impression  EXAM: CT HEAD WITHOUT CONTRAST 10/30/2024 06:56:39 PM   TECHNIQUE: CT of the head was performed without the administration of intravenous contrast. Automated exposure control, iterative reconstruction, and/or weight based adjustment of the mA/kV was utilized to reduce the radiation dose to as low as reasonably achievable.   COMPARISON: None available.   CLINICAL HISTORY: Headache, classic migraine.   FINDINGS:   BRAIN AND VENTRICLES: No acute hemorrhage. No evidence of acute infarct. No hydrocephalus. No extra-axial collection. No mass effect or midline shift.   ORBITS: No acute abnormality.   SINUSES: Mucosal  thickening in right sphenoid sinus.   SOFT TISSUES AND SKULL: No acute soft tissue abnormality. No skull fracture.   IMPRESSION: 1. No acute intracranial abnormality.   Electronically signed by: Morgane Naveau MD 10/30/2024 06:59 PM EST RP Workstation: [id number]      HIGH  PP Hypertension and HA  Differential Diagnosis of Hypertension in pregnancy/postpartum can be preexisting chronic hypertension, Gestational hypertension, preeclampsia, preeclampsia with Severe features, Chronic hypertension with superimposed Preeclampsia, HELLP Syndrome, all of these conditions can lead to Eclampsia, pulmonary edema, IUGR, and/or placenta abruption. Precise diagnosis is often challenging. High clinical suspicion is warranted given the increase in maternal and fetal-neonatal risks associated with preeclampsia.     CBC: Hgb 10.5 ( On Accufer) CMP: Normal postpartum ALT down trending BP Monitoring ( All Mild-SRBP's) EKG: NSR ( For c/o palpations) Excedrin/Reglan /Motrin  for HA  with no resolve  @1630  reassessed  Food tray ordered ( Patient had not eaten anything) HA resolving per patient slightly  BP's still in Mild Range  Plan for Discharge on Procardia  60 BID and HCTZ 25 mg Daily if BP's stabilize per Dr Erik [facility] Attending)   @ [phone] DW Dr Claven (On coming OB Attending) BP's still in Mild-SRBP's  Discussed the plan with the patient  for CT of the head w/o contrast due to resistant HA  HA  still 7/10 on pain scale with visual changes still present after Excedrin, Reglan , Motrin , Imitrex   BP's still in Severe Range s/p Procardia  90XL, hydrochlorothiazide  25mg  , Procardia  30 XL   Add Labetalol  200 mg x 1 PO  Oxycodone  added by Dr Herchel   Patient reported to me that she does not have childcare if she was to be admitted. Currently with her NB and 3 year old child   @ 1916  CT with no acute findings  HA now 5/10 BP's 150's systolic's  over 00-891 diastolic's  Plan for discharge at  this time  BP check 11/01/24 ( Message sent to office for scheduling)   MAU Course: Orders Placed This Encounter  Procedures   CT Head Wo Contrast   CBC   Comprehensive metabolic panel with GFR   EKG 87-Ozji   Discharge patient Discharge disposition: 01-Home or Self Care; Discharge patient date: 10/30/2024   Meds ordered this encounter  Medications   NIFEdipine  (PROCARDIA -XL/NIFEDICAL-XL) 24 hr tablet 90 mg   acetaminophen -caffeine  (EXCEDRIN TENSION HEADACHE) 500-65 MG per tablet 2 tablet   ibuprofen  (ADVIL ) tablet 600 mg   hydrochlorothiazide  (HYDRODIURIL ) tablet 25 mg   metoCLOPramide  (REGLAN ) tablet 10 mg   DISCONTD: NIFEdipine  (PROCARDIA -XL/NIFEDICAL-XL) 24 hr tablet 30 mg   NIFEdipine  (PROCARDIA -XL/NIFEDICAL-XL) 24 hr tablet 30 mg   DISCONTD: SUMAtriptan  (IMITREX ) tablet 50 mg   SUMAtriptan  (IMITREX ) tablet 100 mg   labetalol  (NORMODYNE ) tablet 200 mg   DISCONTD: oxyCODONE  (Oxy IR/ROXICODONE ) immediate release tablet 5 mg    Refill:  0   oxyCODONE  (Oxy IR/ROXICODONE ) immediate release tablet 5-10 mg    Refill:  0   NIFEdipine  (PROCARDIA -XL/NIFEDICAL-XL) 30 MG 24 hr tablet    Sig: Take 2 tablets (60 mg total) by mouth 2 (two) times daily.    Dispense:  120 tablet    Refill:  0    Supervising Provider:   PRATT, TANYA S [[id number]]   labetalol  (NORMODYNE ) 200 MG tablet    Sig: Take 1 tablet (200 mg total) by mouth 2 (two) times daily.    Dispense:  60 tablet    Refill:  0    Supervising Provider:   PRATT, TANYA S [[id number]]   oxycodone  (OXY-IR) 5 MG capsule    Sig: Take 2 capsules (10 mg total) by mouth every 6 (six) hours as needed for up to 2 days for pain.    Dispense:  12 capsule    Refill:  0    Supervising Provider:   PRATT, TANYA S [[id number]]      ASSESSMENT   1. Hypertension, postpartum condition or complication   2. Nonintractable headache, unspecified chronicity pattern, unspecified headache type   3. Postpartum state     PLAN  Discharge home in stable condition  with return precautions.   BP check in office on 11/01/24 ( Message sent to office for scheduling  See AVS for full description of information given to the patient including both verbal and written. Patient verbalized understanding and agrees with the plan as described above.     Follow-up Information     [facility] for [facility] at [name] [name] Follow up on 11/01/2024.   Specialty: Obstetrics and Gynecology Why: Blood pressure check Contact information: [address], [address] [place]  [address] [phone]                Allergies as of 10/30/2024       Reactions   Metronidazole  Nausea And Vomiting  Can take if given Zofran  to take it with.        Medication List     TAKE these medications    acetaminophen  325 MG tablet Commonly known as: Tylenol  Take 2 tablets (650 mg total) by mouth every 4 (four) hours as needed (for pain scale < 4).   FeroSul 325 (65 Fe) MG tablet Generic drug: ferrous sulfate  Take 1 tablet (325 mg total) by mouth every other day.   furosemide  20 MG tablet Commonly known as: LASIX  Take 1 tablet (20 mg total) by mouth daily.   ibuprofen  600 MG tablet Commonly known as: ADVIL  Take 1 tablet (600 mg total) by mouth every 6 (six) hours.   labetalol  200 MG tablet Commonly known as: NORMODYNE  Take 1 tablet (200 mg total) by mouth 2 (two) times daily.   NIFEdipine  30 MG 24 hr tablet Commonly known as: PROCARDIA -XL/NIFEDICAL-XL Take 2 tablets (60 mg total) by mouth 2 (two) times daily. What changed:  medication strength how much to take when to take this   oxyCODONE  5 MG immediate release tablet Commonly known as: Oxy IR/ROXICODONE  Take 1 tablet (5 mg total) by mouth every 4 (four) hours as needed (pain scale 4-7). What changed: Another medication with the same name was added. Make sure you understand how and when to take each.   oxycodone  5 MG capsule Commonly known as: OXY-IR Take 2 capsules (10  mg total) by mouth every 6 (six) hours as needed for up to 2 days for pain. What changed: You were already taking a medication with the same name, and this prescription was added. Make sure you understand how and when to take each.   potassium chloride  SA 20 MEQ tablet Commonly known as: KLOR-CON  M Take 1 tablet (20 mEq total) by mouth daily.   Prenatal Complete 14-0.4 MG Tabs Take 1 tablet by mouth daily.        Olam Dalton, MSN, WHNP-BC [facility], Center for [employer]       [1]  Social History Tobacco Use   Smoking status: Every Day    Current packs/day: 0.50    Average packs/day: 0.5 packs/day for 10.0 years (5.0 ttl pk-yrs)    Types: Cigarettes   Smokeless tobacco: Never  Vaping Use   Vaping status: Never Used  Substance Use Topics   Alcohol use: No    Comment: socially   Drug use: No  [2]  Allergies Allergen Reactions   Metronidazole  Nausea And Vomiting    Can take if given Zofran  to take it with.

## 2024-10-30 NOTE — Progress Notes (Signed)
 Subjective:  [name] [name] is a 34 y.o. female here for BP check.   Hypertension ROS: taking medications as instructed, medication side effects include: headaches and Blurred vision noted since Monday 1/26, no TIA's, no chest pain on exertion, no dyspnea on exertion, and no swelling of ankles.  Patient takes Procardia  90mg  every morning as prescribed. She stated she thought she may need to go to hospital but did not have child care.   Objective:  BP (!) 153/110 (BP Location: Left Arm, Patient Position: Sitting, Cuff Size: Large)   Pulse 85   Ht 5' 6 (1.676 m)   Wt 258 lb 14.4 oz (117.4 kg)   LMP  (LMP Unknown)   Breastfeeding Yes   BMI 41.79 kg/m   Appearance alert, well appearing, and in no distress. General exam BP noted to be well controlled today in office.    Assessment:   Blood Pressure poorly controlled and needs further observation.   Plan:  Reviewed with Dr. Eveline in office and pt was advised to go to MAU for further evaluation. Discussed importance of following up even if she does not have childcare at this time. Pt verbalized understanding and will go to MAU.

## 2024-11-04 ENCOUNTER — Ambulatory Visit: Payer: Self-pay

## 2024-11-07 ENCOUNTER — Ambulatory Visit

## 2024-11-07 VITALS — BP 141/92 | HR 84 | Wt 254.5 lb

## 2024-11-07 DIAGNOSIS — Z0130 Encounter for examination of blood pressure without abnormal findings: Secondary | ICD-10-CM

## 2024-11-07 NOTE — Progress Notes (Signed)
..  Subjective:  [name] [name] is a 34 y.o. postpartum female here for BP check. Pt reports a headache unrelieved with medication, blurry vision in L eye, and occasional dizziness. She states that she went to [facility] on 1/28 and BP meds were adjusted, but home BP readings are ranging from 147/92- 167/91.  Hypertension ROS: taking medications as instructed, no medication side effects noted, no TIA's, no chest pain on exertion, no dyspnea on exertion, and no swelling of ankles. Reports: Headaches, Blurry vision, Dizziness   Objective:  BP (!) 141/92   Pulse 84   Wt 254 lb 8 oz (115.4 kg)   BMI 41.08 kg/m   Appearance oriented to person, place, and time and in mild to moderate distress. General exam BP noted to need improvement and needs further observation.    Assessment:   Blood Pressure needs further observation and needs improvement.   Plan:  Consulted with MD in office and advised pt to return to [facility]. Pt states that she does not know if she will have anyone to watch her children. Stressed the urgency to the patient of being evaluated at the hospital today. Pt states that she will try and go as soon as she can.[name]

## 2024-12-05 ENCOUNTER — Ambulatory Visit: Payer: Self-pay | Admitting: Physician Assistant
